# Patient Record
Sex: Male | Born: 1955 | Race: White | Hispanic: No | Marital: Married | Smoking: Never smoker
Health system: Southern US, Community
[De-identification: ages and names within clinical notes are randomized; demographics above are authoritative.]

## PROBLEM LIST (undated history)

## (undated) ENCOUNTER — Ambulatory Visit: Admission: RE | Discharge status: Absent without leave | Payer: BC Managed Care – PPO | Source: Ambulatory Visit

## (undated) DIAGNOSIS — G709 Myoneural disorder, unspecified: Secondary | ICD-10-CM

## (undated) DIAGNOSIS — R011 Cardiac murmur, unspecified: Secondary | ICD-10-CM

## (undated) DIAGNOSIS — M1710 Unilateral primary osteoarthritis, unspecified knee: Secondary | ICD-10-CM

## (undated) DIAGNOSIS — I712 Thoracic aortic aneurysm, without rupture: Secondary | ICD-10-CM

## (undated) DIAGNOSIS — D649 Anemia, unspecified: Secondary | ICD-10-CM

## (undated) DIAGNOSIS — I7121 Aneurysm of the ascending aorta, without rupture: Secondary | ICD-10-CM

## (undated) DIAGNOSIS — E785 Hyperlipidemia, unspecified: Secondary | ICD-10-CM

## (undated) DIAGNOSIS — M179 Osteoarthritis of knee, unspecified: Secondary | ICD-10-CM

## (undated) DIAGNOSIS — S83209A Unspecified tear of unspecified meniscus, current injury, unspecified knee, initial encounter: Secondary | ICD-10-CM

## (undated) DIAGNOSIS — J189 Pneumonia, unspecified organism: Secondary | ICD-10-CM

## (undated) DIAGNOSIS — G4730 Sleep apnea, unspecified: Secondary | ICD-10-CM

## (undated) DIAGNOSIS — R7303 Prediabetes: Secondary | ICD-10-CM

## (undated) DIAGNOSIS — G4733 Obstructive sleep apnea (adult) (pediatric): Secondary | ICD-10-CM

## (undated) DIAGNOSIS — I890 Lymphedema, not elsewhere classified: Secondary | ICD-10-CM

## (undated) DIAGNOSIS — I10 Essential (primary) hypertension: Secondary | ICD-10-CM

## (undated) DIAGNOSIS — H9313 Tinnitus, bilateral: Secondary | ICD-10-CM

## (undated) HISTORY — DX: Sleep apnea, unspecified: G47.30

## (undated) HISTORY — PX: OTHER SURGICAL HISTORY: SHX169

---
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
## 1978-01-10 HISTORY — PX: VARICOSE VEIN SURGERY: SHX832

## 1992-01-11 HISTORY — PX: OTHER SURGICAL HISTORY: SHX169

## 2006-07-31 ENCOUNTER — Encounter: Admission: RE | Admit: 2006-07-31 | Discharge: 2006-07-31 | Payer: Self-pay | Admitting: Orthopedic Surgery

## 2009-04-03 ENCOUNTER — Encounter: Admission: RE | Admit: 2009-04-03 | Discharge: 2009-04-03 | Payer: Self-pay | Admitting: Family Medicine

## 2009-06-16 ENCOUNTER — Encounter: Admission: RE | Admit: 2009-06-16 | Discharge: 2009-06-16 | Payer: Self-pay | Admitting: Orthopedic Surgery

## 2009-06-18 ENCOUNTER — Inpatient Hospital Stay (HOSPITAL_COMMUNITY): Admission: RE | Admit: 2009-06-18 | Discharge: 2009-06-21 | Payer: Self-pay | Admitting: Orthopedic Surgery

## 2009-06-18 HISTORY — PX: TOTAL HIP ARTHROPLASTY: SHX124

## 2010-01-31 ENCOUNTER — Encounter: Payer: Self-pay | Admitting: Orthopedic Surgery

## 2010-03-29 LAB — URINALYSIS, ROUTINE W REFLEX MICROSCOPIC
Bilirubin Urine: NEGATIVE
Glucose, UA: NEGATIVE mg/dL
Ketones, ur: NEGATIVE mg/dL
Protein, ur: NEGATIVE mg/dL

## 2010-03-29 LAB — BASIC METABOLIC PANEL
BUN: 13 mg/dL (ref 6–23)
Calcium: 7.8 mg/dL — ABNORMAL LOW (ref 8.4–10.5)
Creatinine, Ser: 1.02 mg/dL (ref 0.4–1.5)
Creatinine, Ser: 1.09 mg/dL (ref 0.4–1.5)
GFR calc Af Amer: >60 mL/min (ref >60)
GFR calc non Af Amer: >60 mL/min (ref >60)
Glucose, Bld: 128 mg/dL — ABNORMAL HIGH (ref 70–99)
Potassium: 3.9 mEq/L (ref 3.5–5.1)
Sodium: 136 mEq/L (ref 135–145)

## 2010-03-29 LAB — COMPREHENSIVE METABOLIC PANEL
ALT: 29 U/L (ref 0–53)
Alkaline Phosphatase: 48 U/L (ref 39–117)
Chloride: 104 mEq/L (ref 96–112)
GFR calc Af Amer: >60 mL/min (ref >60)
GFR calc non Af Amer: >60 mL/min (ref >60)
Glucose, Bld: 104 mg/dL — ABNORMAL HIGH (ref 70–99)
Potassium: 4 mEq/L (ref 3.5–5.1)
Sodium: 141 mEq/L (ref 135–145)
Total Bilirubin: 0.9 mg/dL (ref 0.3–1.2)

## 2010-03-29 LAB — CBC
HCT: 24.7 % — ABNORMAL LOW (ref 39.0–52.0)
Hemoglobin: 8.6 g/dL — ABNORMAL LOW (ref 13.0–17.0)
Hemoglobin: 8.6 g/dL — ABNORMAL LOW (ref 13.0–17.0)
MCHC: 34.1 g/dL (ref 30.0–36.0)
MCHC: 34.5 g/dL (ref 30.0–36.0)
MCHC: 34.8 g/dL (ref 30.0–36.0)
MCHC: 34.8 g/dL (ref 30.0–36.0)
MCV: 93.8 fL (ref 78.0–100.0)
MCV: 93.8 fL (ref 78.0–100.0)
MCV: 93.9 fL (ref 78.0–100.0)
Platelets: 114 10*3/uL — ABNORMAL LOW (ref 150–400)
Platelets: 124 10*3/uL — ABNORMAL LOW (ref 150–400)
RBC: 2.63 MIL/uL — ABNORMAL LOW (ref 4.22–5.81)
RBC: 2.65 MIL/uL — ABNORMAL LOW (ref 4.22–5.81)
RBC: 4.47 MIL/uL (ref 4.22–5.81)
RDW: 13 % (ref 11.5–15.5)
RDW: 13.3 % (ref 11.5–15.5)
RDW: 14.1 % (ref 11.5–15.5)
WBC: 6.9 10*3/uL (ref 4.0–10.5)

## 2010-03-29 LAB — CROSSMATCH
ABO/RH(D): A POS
Antibody Screen: NEGATIVE

## 2010-03-29 LAB — DIFFERENTIAL
Lymphs Abs: 0.8 10*3/uL (ref 0.7–4.0)
Monocytes Absolute: 0.5 10*3/uL (ref 0.1–1.0)
Monocytes Relative: 9 % (ref 3–12)
Neutrophils Relative %: 74 % (ref 43–77)

## 2010-03-29 LAB — ABO/RH: ABO/RH(D): A POS

## 2010-03-29 LAB — APTT: aPTT: 30 seconds (ref 24–37)

## 2011-04-19 ENCOUNTER — Emergency Department (HOSPITAL_COMMUNITY)
Admission: EM | Admit: 2011-04-19 | Discharge: 2011-04-20 | Disposition: A | Discharge status: Home / Self Care | Payer: Worker's Compensation | Attending: Emergency Medicine | Admitting: Emergency Medicine

## 2011-04-19 ENCOUNTER — Emergency Department (HOSPITAL_COMMUNITY): Payer: Worker's Compensation

## 2011-04-19 ENCOUNTER — Encounter (HOSPITAL_COMMUNITY): Payer: Self-pay | Admitting: Emergency Medicine

## 2011-04-19 DIAGNOSIS — Z96649 Presence of unspecified artificial hip joint: Secondary | ICD-10-CM | POA: Insufficient documentation

## 2011-04-19 DIAGNOSIS — M25562 Pain in left knee: Secondary | ICD-10-CM

## 2011-04-19 DIAGNOSIS — M25569 Pain in unspecified knee: Secondary | ICD-10-CM | POA: Insufficient documentation

## 2011-04-19 DIAGNOSIS — I10 Essential (primary) hypertension: Secondary | ICD-10-CM | POA: Insufficient documentation

## 2011-04-19 HISTORY — DX: Essential (primary) hypertension: I10

## 2011-04-19 NOTE — ED Notes (Signed)
Pt is c/o pain in his left knee  Pt states he stepped wrong yesterday and felt a sting in it but today the pain has gotten worse  Pt states as he was walking to his truck he heard a snap and since then it has been worse

## 2011-04-19 NOTE — ED Notes (Signed)
Pt called from triage with no answer 

## 2011-04-20 NOTE — ED Provider Notes (Signed)
History     CSN: [id number]  Arrival date & time 04/19/11  2104   First MD Initiated Contact with Patient 04/19/11 2354      Chief Complaint  Patient presents with  . Knee Pain    (Consider location/radiation/quality/duration/timing/severity/associated sxs/prior treatment) Patient is a 56 y.o. male presenting with knee pain. The history is provided by the patient. No language interpreter was used.  Knee Pain This is a new problem. The current episode started yesterday. The problem occurs intermittently. The problem has been gradually worsening. Associated symptoms include arthralgias. The symptoms are aggravated by walking. He has tried NSAIDs, acetaminophen and rest for the symptoms. The treatment provided no relief.    Past Medical History  Diagnosis Date  . Hypertension   . Arthritis     Past Surgical History  Procedure Date  . Total hip arthroplasty   . Veins removed from left leg   . Hernia repair     Family History  Problem Relation Age of Onset  . Diabetes Mother   . Cancer Mother   . Heart failure Other   . Diabetes Other   . Cancer Other   . COPD Other     History  Substance Use Topics  . Smoking status: Never Smoker   . Smokeless tobacco: Not on file  . Alcohol Use: No      Review of Systems  Musculoskeletal: Positive for arthralgias.  All other systems reviewed and are negative.    Allergies  Penicillins  Home Medications   Current Outpatient Rx  Name Route Sig Dispense Refill  . ACETAMINOPHEN 500 MG PO TABS Oral Take 1,000 mg by mouth every 6 (six) hours as needed. Pain    . ATORVASTATIN CALCIUM 10 MG PO TABS Oral Take 10 mg by mouth daily.    . FUROSEMIDE 40 MG PO TABS Oral Take 40 mg by mouth daily.    . IBUPROFEN 800 MG PO TABS Oral Take 800 mg by mouth every 8 (eight) hours as needed. Pain    . LISINOPRIL 40 MG PO TABS Oral Take 40 mg by mouth daily.      BP 165/85  Pulse 64  Temp(Src) 98.3 F (36.8 C) (Oral)  Resp 22  Wt 285  lb (129.275 kg)  SpO2 97%  Physical Exam  Nursing note and vitals reviewed. Constitutional: He is oriented to person, place, and time. He appears well-developed and well-nourished.  HENT:  Head: Normocephalic.  Eyes: Pupils are equal, round, and reactive to light.  Neck: Normal range of motion. Neck supple.  Cardiovascular: Normal rate, regular rhythm, normal heart sounds and intact distal pulses.   Pulmonary/Chest: Effort normal and breath sounds normal.  Abdominal: Soft. Bowel sounds are normal.  Musculoskeletal: Normal range of motion. He exhibits no tenderness.       Left knee: He exhibits no swelling, no effusion, no deformity, no LCL laxity and no MCL laxity.  Neurological: He is alert and oriented to person, place, and time.  Skin: Skin is warm and dry.  Psychiatric: He has a normal mood and affect. His behavior is normal. Judgment and thought content normal.    ED Course  Procedures (including critical care time)  Labs Reviewed - No data to display Dg Knee Complete 4 Views Left  04/20/2011  *RADIOLOGY REPORT*  Clinical Data: Twisted left knee, with left knee pain.  LEFT KNEE - COMPLETE 4+ VIEW  Comparison: None.  Findings: There is no evidence of fracture or dislocation.  The  joint spaces are preserved.  No significant degenerative change is seen; the patellofemoral joint is grossly unremarkable in appearance.  No significant joint effusion is seen.  Mild scattered vascular calcifications are seen.  IMPRESSION:  1.  No evidence of fracture or dislocation. 2.  Mild scattered vascular calcifications seen.  Original Report Authenticated By: Tonia Ghent, M.D.     No diagnosis found.   Knee pain MDM          Jimmye Norman, NP 04/20/11 6295

## 2011-04-20 NOTE — ED Provider Notes (Signed)
Medical screening examination/treatment/procedure(s) were performed by non-physician practitioner and as supervising physician I was immediately available for consultation/collaboration.   Vida Roller, MD 04/20/11 [phone]

## 2011-04-20 NOTE — Discharge Instructions (Signed)
Crutch Use You have been prescribed crutches to take weight off one of your lower legs or feet (extremities). When using crutches, make sure you are not putting pressure on the armpit (axilla). This could cause damage to the nerves that extend from your axilla to the hand and arm. When fitted properly the crutches should be 2 to 3 finger widths below the axilla. Your weight should be supported by your hand, and not by resting upon the crutch with the axilla. When walking, first step with the crutches, then swing the healthy leg through and slightly ahead. When going up stairs, first step up with the healthy leg and then follow with the crutches and injured leg up to the same step, and so forth. If there is a handrail, hold both crutches in one hand, place your other hand on the handrail, and while placing your weight on your arms, lift your good leg to the step, then bring the crutches and the injured leg up to that step. Repeat for each step. When going down stairs, first step with the injured leg and crutches, following down with the healthy leg to the same step. Be very careful, as going down stairs with crutches is very challenging. If you feel wobbly or nervous, sit down and inch yourself down the stairs on your butt. To get up from a chair, hold injured leg forward, grab armrest with one hand and the top of the crutches with the other hand. Using these supports, pull yourself up to a standing position. Reverse this procedure for sitting. See your caregiver for follow up as suggested. If you are discharged in an ace wrap and develop numbness, tingling, swelling, or increased pain, loosen the ace wrap and re-wrap looser. If these problems persist, see your caregiver as needed. If you have been instructed to use partial weight bearing, bear (apply) the amount of weight as suggested by your caregiver. Do not bear weight in an amount that causes pain on the area of injury. Document Released: 12/25/1999  Document Revised: 12/16/2010 Document Reviewed: 03/03/2008 [facility] Patient Information 2012 [place], [place].  Wear and Tear Disorders of the Knee (Arthritis, Osteoarthritis) Everyone will experience wear and tear injuries (arthritis, osteoarthritis) of the knee. These are the changes we all get as we age. They come from the joint stress of daily living. The amount of cartilage damage in your knee and your symptoms determine if you need surgery. Mild problems require approximately two months recovery time. More severe problems take several months to recover. With mild problems, your surgeon may find worn and rough cartilage surfaces. With severe changes, your surgeon may find cartilage that has completely worn away and exposed the bone. Loose bodies of bone and cartilage, bone spurs (excess bone growth), and injuries to the menisci (cushions between the large bones of your leg) are also common. All of these problems can cause pain. For a mild wear and tear problem, rough cartilage may simply need to be shaved and smoothed. For more severe problems with areas of exposed bone, your surgeon may use an instrument for roughing up the bone surfaces to stimulate new cartilage growth. Loose bodies are usually removed. Torn menisci may be trimmed or repaired. ABOUT THE ARTHROSCOPIC PROCEDURE Arthroscopy is a surgical technique. It allows your orthopedic surgeon to diagnose and treat your knee injury with accuracy. The surgeon looks into your knee through a small scope. The scope is like a small (pencil-sized) telescope. Arthroscopy is less invasive than open knee surgery. You  can expect a more rapid recovery. After the procedure, you will be moved to a recovery area until most of the effects of the medication have worn off. Your caregiver will discuss the test results with you. RECOVERY The severity of the arthritis and the type of procedure performed will determine recovery time. Other important factors include  age, physical condition, medical conditions, and the type of rehabilitation program. Strengthening your muscles after arthroscopy helps guarantee a better recovery. Follow your caregiver's instructions. Use crutches, rest, elevate, ice, and do knee exercises as instructed. Your caregivers will help you and instruct you with exercises and other physical therapy required to regain your mobility, muscle strength, and functioning following surgery. Only take over-the-counter or prescription medicines for pain, discomfort, or fever as directed by your caregiver.  SEEK MEDICAL CARE IF:   There is increased bleeding (more than a small spot) from the wound.   You notice redness, swelling, or increasing pain in the wound.   Pus is coming from wound.   You develop an unexplained oral temperature above 102 F (38.9 C) , or as your caregiver suggests.   You notice a foul smell coming from the wound or dressing.   You have severe pain with motion of the knee.  SEEK IMMEDIATE MEDICAL CARE IF:   You develop a rash.   You have difficulty breathing.   You have any allergic problems.  MAKE SURE YOU:   Understand these instructions.   Will watch your condition.   Will get help right away if you are not doing well or get worse.  Document Released: 12/25/1999 Document Revised: 12/16/2010 Document Reviewed: 05/23/2007 [facility] Patient Information 2012 [place], [place].

## 2011-09-09 ENCOUNTER — Other Ambulatory Visit: Payer: Self-pay | Admitting: Pain Medicine

## 2011-09-23 ENCOUNTER — Encounter (HOSPITAL_BASED_OUTPATIENT_CLINIC_OR_DEPARTMENT_OTHER): Payer: Self-pay | Admitting: *Deleted

## 2011-09-23 NOTE — Progress Notes (Signed)
NPO AFTER MN. ARRIVES AT 0945. NEEDS EKG AND ISTAT. WILL TAKE LIPITOR AND TRAMADOL IF NEEDED AM OF SURG W/ SIP OF WATER.

## 2011-09-27 ENCOUNTER — Encounter (HOSPITAL_BASED_OUTPATIENT_CLINIC_OR_DEPARTMENT_OTHER)
Admission: RE | Disposition: A | Discharge status: Home / Self Care | Payer: Self-pay | Source: Ambulatory Visit | Attending: Specialist

## 2011-09-27 ENCOUNTER — Encounter (HOSPITAL_BASED_OUTPATIENT_CLINIC_OR_DEPARTMENT_OTHER): Payer: Self-pay | Admitting: Anesthesiology

## 2011-09-27 ENCOUNTER — Other Ambulatory Visit: Payer: Self-pay | Admitting: Family Medicine

## 2011-09-27 ENCOUNTER — Ambulatory Visit (HOSPITAL_BASED_OUTPATIENT_CLINIC_OR_DEPARTMENT_OTHER)
Admission: RE | Admit: 2011-09-27 | Discharge: 2011-09-27 | Disposition: A | Discharge status: Home / Self Care | Payer: Worker's Compensation | Source: Ambulatory Visit | Attending: Specialist | Admitting: Specialist

## 2011-09-27 DIAGNOSIS — I719 Aortic aneurysm of unspecified site, without rupture: Secondary | ICD-10-CM

## 2011-09-27 HISTORY — DX: Thoracic aortic aneurysm, without rupture: I71.2

## 2011-09-27 HISTORY — DX: Tinnitus, bilateral: H93.13

## 2011-09-27 HISTORY — DX: Cardiac murmur, unspecified: R01.1

## 2011-09-27 HISTORY — DX: Hyperlipidemia, unspecified: E78.5

## 2011-09-27 HISTORY — DX: Aneurysm of the ascending aorta, without rupture: I71.21

## 2011-09-27 HISTORY — DX: Obstructive sleep apnea (adult) (pediatric): G47.33

## 2011-09-27 HISTORY — DX: Osteoarthritis of knee, unspecified: M17.9

## 2011-09-27 HISTORY — DX: Unilateral primary osteoarthritis, unspecified knee: M17.10

## 2011-09-27 HISTORY — DX: Unspecified tear of unspecified meniscus, current injury, unspecified knee, initial encounter: S83.209A

## 2011-09-27 SURGERY — ARTHROSCOPY, KNEE
Anesthesia: Monitor Anesthesia Care | Laterality: Left

## 2011-09-27 SURGICAL SUPPLY — 42 items
BANDAGE ESMARK 6X9 LF (GAUZE/BANDAGES/DRESSINGS) IMPLANT
BANDAGE GAUZE ELAST BULKY 4 IN (GAUZE/BANDAGES/DRESSINGS) IMPLANT
BLADE 4.2CUDA (BLADE) IMPLANT
BLADE CUDA GRT WHITE 3.5 (BLADE) IMPLANT
BLADE CUDA SHAVER 3.5 (BLADE) IMPLANT
BNDG ESMARK 6X9 LF (GAUZE/BANDAGES/DRESSINGS)
CANISTER SUCT LVC 12 LTR MEDI- (MISCELLANEOUS) IMPLANT
CANISTER SUCTION 1200CC (MISCELLANEOUS) IMPLANT
CLOTH BEACON ORANGE TIMEOUT ST (SAFETY) IMPLANT
DRAPE ARTHROSCOPY W/POUCH 114 (DRAPES) IMPLANT
DRAPE INCISE 23X17 IOBAN STRL (DRAPES)
DRAPE INCISE IOBAN 23X17 STRL (DRAPES) IMPLANT
DRAPE INCISE IOBAN 66X45 STRL (DRAPES) IMPLANT
DRSG PAD ABDOMINAL 8X10 ST (GAUZE/BANDAGES/DRESSINGS) IMPLANT
DURAPREP 26ML APPLICATOR (WOUND CARE) IMPLANT
ELECT MENISCUS 165MM 90D (ELECTRODE) IMPLANT
ELECT REM PT RETURN 9FT ADLT (ELECTROSURGICAL)
ELECTRODE REM PT RTRN 9FT ADLT (ELECTROSURGICAL) IMPLANT
GAUZE XEROFORM 1X8 LF (GAUZE/BANDAGES/DRESSINGS) IMPLANT
GLOVE INDICATOR 8.0 STRL GRN (GLOVE) IMPLANT
GLOVE SURG ORTHO 8.0 STRL STRW (GLOVE) IMPLANT
GOWN PREVENTION PLUS LG XLONG (DISPOSABLE) IMPLANT
GOWN STRL REIN XL XLG (GOWN DISPOSABLE) IMPLANT
IMMOBILIZER KNEE 22 UNIV (SOFTGOODS) IMPLANT
IMMOBILIZER KNEE 24 THIGH 36 (MISCELLANEOUS) IMPLANT
IMMOBILIZER KNEE 24 UNIV (MISCELLANEOUS)
KNEE WRAP E Z 3 GEL PACK (MISCELLANEOUS) IMPLANT
MINI VAC (SURGICAL WAND) IMPLANT
PACK ARTHROSCOPY DSU (CUSTOM PROCEDURE TRAY) IMPLANT
PACK BASIN DAY SURGERY FS (CUSTOM PROCEDURE TRAY) IMPLANT
PADDING CAST ABS 4INX4YD NS (CAST SUPPLIES)
PADDING CAST ABS COTTON 4X4 ST (CAST SUPPLIES) IMPLANT
PENCIL BUTTON HOLSTER BLD 10FT (ELECTRODE) IMPLANT
SET ARTHROSCOPY TUBING (MISCELLANEOUS)
SET ARTHROSCOPY TUBING LN (MISCELLANEOUS) IMPLANT
SPONGE GAUZE 4X4 12PLY (GAUZE/BANDAGES/DRESSINGS) IMPLANT
SUT ETHILON 3 0 FSL (SUTURE) IMPLANT
SYR CONTROL 10ML LL (SYRINGE) IMPLANT
TOWEL OR 17X24 6PK STRL BLUE (TOWEL DISPOSABLE) IMPLANT
WAND 30 DEG SABER W/CORD (SURGICAL WAND) IMPLANT
WAND 90 DEG TURBOVAC W/CORD (SURGICAL WAND) IMPLANT
WATER STERILE IRR 500ML POUR (IV SOLUTION) IMPLANT

## 2011-09-27 NOTE — Anesthesia Preprocedure Evaluation (Deleted)
Anesthesia Evaluation Anesthesia Physical Anesthesia Plan  ASA:   Anesthesia Plan:    Post-op Pain Management:    Induction:   Airway Management Planned:   Additional Equipment:   Intra-op Plan:   Post-operative Plan:   Informed Consent:   Plan Discussed with:   Anesthesia Plan Comments: (Pt cancelled. CT angio 06/2009 showed 4.8 cm ascending aortic aneurysm. No follow up since. Needs re-evaluation/comparison. Explained to patient extensively.)        Anesthesia Quick Evaluation

## 2011-09-27 NOTE — H&P (Signed)
Travis Myers is an 56 y.o. male.   Chief Complaint leftknee hurts HPI: left knee hurts after work related injury  Past Medical History  Diagnosis Date  . Hypertension   . Hyperlipidemia   . OA (osteoarthritis) of knee left  . Acute meniscal tear of knee left  . OSA (obstructive sleep apnea) NON-TOLERANT CPAP  . Aneurysm, ascending aorta 4.8cm per ct 2011    ASYMPTOMATIC  . Heart murmur MILD-- ASYMPTOMATIC  . Tinnitus of both ears     Past Surgical History  Procedure Date  . Total hip arthroplasty 06-18-2009    OA LEFT HIP  . Veins removed from left leg 1994  . Bilateral iinguinal hernia repair 1963  . Tonsillectomy 1965    Family History  Problem Relation Age of Onset  . Diabetes Mother   . Cancer Mother   . Heart failure Other   . Diabetes Other   . Cancer Other   . COPD Other    Social History:  reports that he has never smoked. He has never used smokeless tobacco. He reports that he drinks alcohol. He reports that he does not use illicit drugs.  Allergies:  Allergies  Allergen Reactions  . Penicillins Other (See Comments)    Unknown reaction    No prescriptions prior to admission    No results found for this or any previous visit (from the past 48 hour(s)). No results found.  ROS negative  Height 6' (1.829 m), weight 126.554 kg (279 lb). Physical Exam  Knee tender joint line full ROM NV intact2 Assessment/Plan left *knee torn meniscus Aneurysm found by anesthesiologist and he has cancelled surgery until evaluated Billiejean Schimek ANDREW 09/27/2011, 1:32 PM

## 2011-09-29 ENCOUNTER — Ambulatory Visit
Admission: RE | Admit: 2011-09-29 | Discharge: 2011-09-29 | Disposition: A | Discharge status: Home / Self Care | Payer: Self-pay | Source: Ambulatory Visit | Attending: Family Medicine | Admitting: Family Medicine

## 2011-09-29 DIAGNOSIS — I719 Aortic aneurysm of unspecified site, without rupture: Secondary | ICD-10-CM

## 2011-09-29 MED ORDER — IOHEXOL 350 MG/ML SOLN
80.0000 mL | Freq: Once | INTRAVENOUS | Status: AC | PRN
  Administered 2011-09-29: 80 mL via INTRAVENOUS

## 2011-10-13 HISTORY — PX: NM MYOCAR PERF WALL MOTION: HXRAD629

## 2011-10-14 ENCOUNTER — Ambulatory Visit: Payer: Self-pay | Admitting: Cardiovascular Disease

## 2011-10-18 HISTORY — PX: DOPPLER ECHOCARDIOGRAPHY: SHX263

## 2011-10-21 ENCOUNTER — Encounter (HOSPITAL_BASED_OUTPATIENT_CLINIC_OR_DEPARTMENT_OTHER): Payer: Self-pay | Admitting: *Deleted

## 2011-10-21 NOTE — Progress Notes (Addendum)
NPO AFTER MN WITH EXCEPTION CLEAR LIQUIDS UNTIL 0830. (NO CREAM/ MILK PRODUCTS). ARRIVES AT 1230. NEEDS ISTAT. CURRENT EKG, LOV, STRESS TEST AND ECHO TO BE FAXED FROM DR [name].   REVIEWED CHART W/ DR FORTUNE MDA W/ CARDIAC NOTE/ CLEARANCE, OK TO PROCEED.

## 2011-10-24 ENCOUNTER — Other Ambulatory Visit: Payer: Self-pay | Admitting: Pain Medicine

## 2011-10-24 NOTE — Progress Notes (Signed)
SPOKE W/ PT TODAY . CASE MOVED TO Thursday 10-27-2011 PER OFFICE . SAME TIME OF ARRIVAL AND INSTRUCTIONS, PT VERBALIZED UNDERSTANTING.

## 2011-10-27 ENCOUNTER — Encounter (HOSPITAL_BASED_OUTPATIENT_CLINIC_OR_DEPARTMENT_OTHER): Payer: Self-pay | Admitting: Anesthesiology

## 2011-10-27 ENCOUNTER — Encounter (HOSPITAL_BASED_OUTPATIENT_CLINIC_OR_DEPARTMENT_OTHER)
Admission: RE | Disposition: A | Discharge status: Home / Self Care | Payer: Self-pay | Source: Ambulatory Visit | Attending: Specialist

## 2011-10-27 ENCOUNTER — Ambulatory Visit (HOSPITAL_BASED_OUTPATIENT_CLINIC_OR_DEPARTMENT_OTHER): Payer: Worker's Compensation | Admitting: Anesthesiology

## 2011-10-27 ENCOUNTER — Ambulatory Visit (HOSPITAL_BASED_OUTPATIENT_CLINIC_OR_DEPARTMENT_OTHER)
Admission: RE | Admit: 2011-10-27 | Discharge: 2011-10-27 | Disposition: A | Discharge status: Home / Self Care | Payer: Worker's Compensation | Source: Ambulatory Visit | Attending: Specialist | Admitting: Specialist

## 2011-10-27 ENCOUNTER — Encounter (HOSPITAL_BASED_OUTPATIENT_CLINIC_OR_DEPARTMENT_OTHER): Payer: Self-pay | Admitting: *Deleted

## 2011-10-27 DIAGNOSIS — X58XXXA Exposure to other specified factors, initial encounter: Secondary | ICD-10-CM | POA: Insufficient documentation

## 2011-10-27 DIAGNOSIS — Z79899 Other long term (current) drug therapy: Secondary | ICD-10-CM | POA: Insufficient documentation

## 2011-10-27 DIAGNOSIS — Z9889 Other specified postprocedural states: Secondary | ICD-10-CM

## 2011-10-27 DIAGNOSIS — M1710 Unilateral primary osteoarthritis, unspecified knee: Secondary | ICD-10-CM | POA: Insufficient documentation

## 2011-10-27 DIAGNOSIS — E785 Hyperlipidemia, unspecified: Secondary | ICD-10-CM | POA: Insufficient documentation

## 2011-10-27 DIAGNOSIS — I10 Essential (primary) hypertension: Secondary | ICD-10-CM | POA: Insufficient documentation

## 2011-10-27 DIAGNOSIS — IMO0002 Reserved for concepts with insufficient information to code with codable children: Secondary | ICD-10-CM | POA: Insufficient documentation

## 2011-10-27 LAB — POCT I-STAT, CHEM 8
BUN: 17 mg/dL (ref 6–23)
Calcium, Ion: 1.23 mmol/L (ref 1.12–1.23)
Chloride: 104 mEq/L (ref 96–112)
Creatinine, Ser: 1 mg/dL (ref 0.50–1.35)
Glucose, Bld: 100 mg/dL — ABNORMAL HIGH (ref 70–99)
HCT: 41 % (ref 39.0–52.0)
Potassium: 4.1 mEq/L (ref 3.5–5.1)

## 2011-10-27 SURGERY — ARTHROSCOPY, KNEE, WITH MEDIAL MENISCECTOMY
Anesthesia: General | Site: Knee | Laterality: Left | Wound class: Clean

## 2011-10-27 MED ORDER — SODIUM CHLORIDE 0.9 % IV SOLN: INTRAVENOUS | Status: DC

## 2011-10-27 MED ORDER — ACETAMINOPHEN 10 MG/ML IV SOLN
INTRAVENOUS | Status: DC | PRN
  Administered 2011-10-27: 1000 mg via INTRAVENOUS

## 2011-10-27 MED ORDER — MIDAZOLAM HCL 5 MG/5ML IJ SOLN
INTRAMUSCULAR | Status: DC | PRN
  Administered 2011-10-27: 2 mg via INTRAVENOUS

## 2011-10-27 MED ORDER — BUPIVACAINE-EPINEPHRINE PF 0.25-1:200000 % IJ SOLN
INTRAMUSCULAR | Status: DC | PRN
  Administered 2011-10-27: 30 mL

## 2011-10-27 MED ORDER — HYDROCODONE-ACETAMINOPHEN 5-325 MG PO TABS: 1.0000 | ORAL_TABLET | ORAL | Status: DC | PRN

## 2011-10-27 MED ORDER — DOXYCYCLINE HYCLATE 50 MG PO CAPS: 100.0000 mg | ORAL_CAPSULE | Freq: Two times a day (BID) | ORAL | Status: DC

## 2011-10-27 MED ORDER — SODIUM CHLORIDE 0.9 % IR SOLN
Status: DC | PRN
  Administered 2011-10-27: 12000 mL

## 2011-10-27 MED ORDER — LACTATED RINGERS IV SOLN: INTRAVENOUS | Status: DC

## 2011-10-27 MED ORDER — PROMETHAZINE HCL 25 MG/ML IJ SOLN: 6.2500 mg | INTRAMUSCULAR | Status: DC | PRN

## 2011-10-27 MED ORDER — LACTATED RINGERS IV SOLN
INTRAVENOUS | Status: DC
  Administered 2011-10-27 (×2): via INTRAVENOUS

## 2011-10-27 MED ORDER — MORPHINE SULFATE 4 MG/ML IJ SOLN
INTRAMUSCULAR | Status: DC | PRN
  Administered 2011-10-27: 4 mg

## 2011-10-27 MED ORDER — FENTANYL CITRATE 0.05 MG/ML IJ SOLN
INTRAMUSCULAR | Status: DC | PRN
  Administered 2011-10-27 (×4): 50 ug via INTRAVENOUS

## 2011-10-27 MED ORDER — PROPOFOL 10 MG/ML IV BOLUS
INTRAVENOUS | Status: DC | PRN
  Administered 2011-10-27: 300 mg via INTRAVENOUS

## 2011-10-27 MED ORDER — MIDAZOLAM HCL 2 MG/2ML IJ SOLN
2.0000 mg | Freq: Once | INTRAMUSCULAR | Status: AC
  Administered 2011-10-27: 2 mg via INTRAVENOUS

## 2011-10-27 MED ORDER — FENTANYL CITRATE 0.05 MG/ML IJ SOLN
100.0000 ug | Freq: Once | INTRAMUSCULAR | Status: AC
  Administered 2011-10-27: 100 ug via INTRAVENOUS

## 2011-10-27 MED ORDER — POVIDONE-IODINE 7.5 % EX SOLN: Freq: Once | CUTANEOUS | Status: DC

## 2011-10-27 MED ORDER — LIDOCAINE HCL (CARDIAC) 20 MG/ML IV SOLN
INTRAVENOUS | Status: DC | PRN
  Administered 2011-10-27: 80 mg via INTRAVENOUS

## 2011-10-27 MED ORDER — HYDROMORPHONE HCL PF 1 MG/ML IJ SOLN: 0.2500 mg | INTRAMUSCULAR | Status: DC | PRN

## 2011-10-27 MED ORDER — BUPIVACAINE HCL 0.25 % IJ SOLN
INTRAMUSCULAR | Status: DC | PRN
  Administered 2011-10-27: 20 mL

## 2011-10-27 MED ORDER — DEXAMETHASONE SODIUM PHOSPHATE 4 MG/ML IJ SOLN
INTRAMUSCULAR | Status: DC | PRN
  Administered 2011-10-27: 10 mg via INTRAVENOUS

## 2011-10-27 MED ORDER — CLINDAMYCIN PHOSPHATE 900 MG/50ML IV SOLN
900.0000 mg | INTRAVENOUS | Status: AC
  Administered 2011-10-27: 900 mg via INTRAVENOUS

## 2011-10-27 MED ORDER — ONDANSETRON HCL 4 MG/2ML IJ SOLN
INTRAMUSCULAR | Status: DC | PRN
  Administered 2011-10-27: 4 mg via INTRAVENOUS

## 2011-10-27 SURGICAL SUPPLY — 48 items
BANDAGE ESMARK 6X9 LF (GAUZE/BANDAGES/DRESSINGS) IMPLANT
BANDAGE GAUZE ELAST BULKY 4 IN (GAUZE/BANDAGES/DRESSINGS) ×3 IMPLANT
BLADE 4.2CUDA (BLADE) IMPLANT
BLADE CUDA GRT WHITE 3.5 (BLADE) ×6 IMPLANT
BLADE CUDA SHAVER 3.5 (BLADE) IMPLANT
BNDG ESMARK 6X9 LF (GAUZE/BANDAGES/DRESSINGS)
CANISTER SUCT LVC 12 LTR MEDI- (MISCELLANEOUS) ×3 IMPLANT
CANISTER SUCTION 1200CC (MISCELLANEOUS) ×3 IMPLANT
CLOTH BEACON ORANGE TIMEOUT ST (SAFETY) ×3 IMPLANT
DRAPE ARTHROSCOPY W/POUCH 114 (DRAPES) ×3 IMPLANT
DRAPE INCISE 23X17 IOBAN STRL (DRAPES) ×1
DRAPE INCISE IOBAN 23X17 STRL (DRAPES) ×2 IMPLANT
DRAPE INCISE IOBAN 66X45 STRL (DRAPES) IMPLANT
DRSG PAD ABDOMINAL 8X10 ST (GAUZE/BANDAGES/DRESSINGS) ×3 IMPLANT
DURAPREP 26ML APPLICATOR (WOUND CARE) ×3 IMPLANT
ELECT MENISCUS 165MM 90D (ELECTRODE) IMPLANT
ELECT REM PT RETURN 9FT ADLT (ELECTROSURGICAL)
ELECTRODE REM PT RTRN 9FT ADLT (ELECTROSURGICAL) IMPLANT
GAUZE XEROFORM 1X8 LF (GAUZE/BANDAGES/DRESSINGS) ×3 IMPLANT
GLOVE ECLIPSE 7.0 STRL STRAW (GLOVE) ×3 IMPLANT
GLOVE INDICATOR 7.0 STRL GRN (GLOVE) ×6 IMPLANT
GLOVE INDICATOR 8.0 STRL GRN (GLOVE) ×6 IMPLANT
GLOVE INDICATOR 8.5 STRL (GLOVE) ×3 IMPLANT
GLOVE SURG ORTHO 8.0 STRL STRW (GLOVE) ×6 IMPLANT
GOWN PREVENTION PLUS LG XLONG (DISPOSABLE) ×6 IMPLANT
GOWN STRL REIN XL XLG (GOWN DISPOSABLE) ×6 IMPLANT
IMMOBILIZER KNEE 22 UNIV (SOFTGOODS) IMPLANT
IMMOBILIZER KNEE 24 THIGH 36 (MISCELLANEOUS) IMPLANT
IMMOBILIZER KNEE 24 UNIV (MISCELLANEOUS)
IV NS IRRIG 3000ML ARTHROMATIC (IV SOLUTION) ×12 IMPLANT
KNEE WRAP E Z 3 GEL PACK (MISCELLANEOUS) ×3 IMPLANT
MINI VAC (SURGICAL WAND) ×3 IMPLANT
PACK ARTHROSCOPY DSU (CUSTOM PROCEDURE TRAY) ×3 IMPLANT
PACK BASIN DAY SURGERY FS (CUSTOM PROCEDURE TRAY) ×3 IMPLANT
PADDING CAST ABS 4INX4YD NS (CAST SUPPLIES) ×1
PADDING CAST ABS COTTON 4X4 ST (CAST SUPPLIES) ×2 IMPLANT
PADDING CAST COTTON 6X4 STRL (CAST SUPPLIES) ×3 IMPLANT
PENCIL BUTTON HOLSTER BLD 10FT (ELECTRODE) IMPLANT
SET ARTHROSCOPY TUBING (MISCELLANEOUS) ×1
SET ARTHROSCOPY TUBING LN (MISCELLANEOUS) ×2 IMPLANT
SPONGE GAUZE 4X4 12PLY (GAUZE/BANDAGES/DRESSINGS) ×3 IMPLANT
SUT ETHILON 3 0 FSL (SUTURE) ×3 IMPLANT
SYR CONTROL 10ML LL (SYRINGE) ×3 IMPLANT
SYRINGE 10CC LL (SYRINGE) ×3 IMPLANT
TOWEL OR 17X24 6PK STRL BLUE (TOWEL DISPOSABLE) ×3 IMPLANT
WAND 30 DEG SABER W/CORD (SURGICAL WAND) IMPLANT
WAND 90 DEG TURBOVAC W/CORD (SURGICAL WAND) ×3 IMPLANT
WATER STERILE IRR 500ML POUR (IV SOLUTION) ×3 IMPLANT

## 2011-10-27 NOTE — Op Note (Signed)
Preop diagnosis left knee torn medial meniscus Postop diagnosis same Procedure left knee arthroscopic partial medial meniscectomy Surgeon Valma Cava M.D. Assistant Oneida Alar PA-C Anesthesia knee block with monitored anesthesia care Estimated blood loss minimal Drains none Tourniquet time none Complications none Disposition to PACU stable  Operative details Patient was counseled in the holding area. Cracks site was marked and signed appropriately. Block was administered per anesthesiologist. Fixation taken to the operating room IV Ancef given preoperatively. In the operating room placed supine monitor anesthesia care left eye and a thigh holder prepped with DuraPrep and draped into a sterile fashion.  Time out was done and confirmed by all involved. Arthroscopic portals were established proximal medial inferomedial and inferolateral. Diagnostic arthroscopy revealed normal synovium. A Patellofemoral joint normal articular cartilage and tracking. Anterior and posterior cruciate ligaments intact. Lateral compartment was inspected lateral femoral condyle unremarkable lateral tibial plateau mild chondromalacia lateral meniscus intact. Medial and lateral gutters unremarkable.  Medial compartment inspected articular cartilage was healthy. Complex tear posterior horn medial meniscus radial component and horizontal cleavage component utilizing baskets and motorized shaver a partial medial meniscectomy was performed back to a nice stable rim. ArthroCare system was utilized to gently smoothed down the edges. No other abnormalities were noted. Irrigated arthroscopic equipment was removed. 3 portals were closed with 4-0 nylon suture. 10 cc of 0.25% Marcaine was placed into the skin and 10 cc of 0.25% Marcaine with 4 mg of morphine sulfate was injected into the knee joint. Sterile dressing was applied TED hose and ice pack. No complications or problems. Awakened taken from operating room to PACU in stable  condition.

## 2011-10-27 NOTE — H&P (Signed)
Travis Myers is an 56 y.o. male.   Chief Complaint: Left knee pain HPI: 56 year old gentleman well-known to Dr. Thomasena Myers for evaluation of left knee pain. Patient initially had an injury earlier this spring which is of initial evaluation felt that an MCL sprain and a subchondral fracture the medial tibial plateau. Patient MRI also indicated he had meniscal tears within the knee. Patient's was treated conservative initially to allow the MCL sprain and a subchondral fracture to heal but continued having problems related to the internal meniscal tearing. Patient was initially scheduled to have a knee arthroscopy previously but patient was found to have an aortic dilatation. His surgery was canceled and for cardiac evaluation of his aorta. Patient is subsequently been cleared for this upcoming surgery which is monitoring his aorta. He denies any other change in his current medical history. Patient is elected proceed with a knee arthroscopy of the left for debridement of the torn menisci and possible chondroplasties for some arthritic issues.  Past Medical History  Diagnosis Date  . Hypertension   . Hyperlipidemia   . OA (osteoarthritis) of knee left  . Acute meniscal tear of knee left  . OSA (obstructive sleep apnea) NON-TOLERANT CPAP  . Heart murmur MILD-- ASYMPTOMATIC  . Tinnitus of both ears   . Aneurysm, ascending aorta 4.8cm per ct 2011/ CARDIOLOGSIT--  DR [name]  LOV  10-07-2011  NOTE W/ CHART AND REQUEST LEXISCAN MYOVIEW AND ECHO TO BE FAXED.    ASYMPTOMATIC    Past Surgical History  Procedure Date  . Total hip arthroplasty 06-18-2009    OA LEFT HIP  . Veins removed from left leg 1994  . Bilateral iinguinal hernia repair 1963  . Tonsillectomy 1965    Family History  Problem Relation Age of Onset  . Diabetes Mother   . Cancer Mother   . Heart failure Other   . Diabetes Other   . Cancer Other   . COPD Other    Social History:  reports that he has never smoked. He has never  used smokeless tobacco. He reports that he drinks alcohol. He reports that he does not use illicit drugs.  Allergies:  Allergies  Allergen Reactions  . Penicillins Other (See Comments)    Unknown reaction    Medications Prior to Admission  Medication Sig Dispense Refill  . atorvastatin (LIPITOR) 10 MG tablet Take 10 mg by mouth every morning.       . furosemide (LASIX) 40 MG tablet Take 40 mg by mouth every morning.       Marland Kitchen lisinopril (PRINIVIL,ZESTRIL) 40 MG tablet Take 40 mg by mouth every morning.       . traMADol (ULTRAM) 50 MG tablet Take 50 mg by mouth every 6 (six) hours as needed.      Marland Kitchen acetaminophen (TYLENOL) 500 MG tablet Take 1,000 mg by mouth every 6 (six) hours as needed. Pain        Results for orders placed during the hospital encounter of 10/27/11 (from the past 48 hour(s))  POCT I-STAT, CHEM 8     Status: Abnormal   Collection Time   10/27/11 12:58 PM      Component Value Range Comment   Sodium 139  135 - 145 mEq/L    Potassium 4.1  3.5 - 5.1 mEq/L    Chloride 104  96 - 112 mEq/L    BUN 17  6 - 23 mg/dL    Creatinine, Ser 1.61  0.50 - 1.35 mg/dL  Glucose, Bld 100 (*) 70 - 99 mg/dL    Calcium, Ion 1.61  0.96 - 1.23 mmol/L    TCO2 23  0 - 100 mmol/L    Hemoglobin 13.9  13.0 - 17.0 g/dL    HCT 04.5  40.9 - 81.1 %    No results found.  Review of Systems  All other systems reviewed and are negative.    Blood pressure 139/86, pulse 66, temperature 97.6 F (36.4 C), temperature source Oral, resp. rate 20, height 6' (1.829 m), weight 135.626 kg (299 lb), SpO2 97.00%. Physical Exam patient is conscious alert well-developed healthy-appearing gentleman appears to be in no distress in hospital gurney his neck is supple no palpable lymphadenopathy good range of motion of the cervical spine. Chest is clear throughout heart is regular rate and rhythm abdomen is soft he does have bowel sounds present she's got normal motion of his upper tremoring. Left knee has had a  knee block. He is otherwise neurologically intact and left lower extremity his right lower extremity is normal appearance and physical exam with good pulses in the ankle.  Assessment/Plan Impression left knee medial MCL sprain healed a subchondral fracture the tibial plateau with findings of a tear of his medial meniscus and lateral meniscus. He does have a central compression of the articulating surface of the lateral plateau possible on chronic changes verses degenerative arthritic changes.  Plan arthroscopic evaluation of his left knee with debridement of menisci and chondroplasty as needed  [name] W 10/27/2011, 1:49 PM

## 2011-10-27 NOTE — Anesthesia Procedure Notes (Addendum)
  Narrative:    Procedure Name: LMA Insertion Date/Time: 10/27/2011 2:47 PM Performed by: Maris Berger T Pre-anesthesia Checklist: Patient identified, Emergency Drugs available, Suction available and Patient being monitored Patient Re-evaluated:Patient Re-evaluated prior to inductionOxygen Delivery Method: Circle System Utilized Preoxygenation: Pre-oxygenation with 100% oxygen Intubation Type: IV induction Ventilation: Mask ventilation without difficulty LMA: LMA inserted LMA Size: 5.0 Number of attempts: 1 Airway Equipment and Method: bite block Placement Confirmation: positive ETCO2 Dental Injury: Teeth and Oropharynx as per pre-operative assessment     Left knee intra-articular block under local anesthesia with IV sedation for arthroscopic procedure of knee. Time out performed. Routine prep of left knee followed by 10cc of 1% xylocaine SQ local anesthesia. A 18 gauge needle inserted intra-articular into left knee followed by the injection of 30cc .25% Bupivacaine with Epi 1: 200,000; injected in 5 cc dose intervals. VSS. No complications noted. IV sedation with 2cc of Sublimaze in addition to 2mg  of Versed prior to procedure. Coralyn Mark MD

## 2011-10-27 NOTE — Anesthesia Postprocedure Evaluation (Signed)
  Anesthesia Post-op Note  Patient: Travis Myers  Procedure(s) Performed: Procedure(s) (LRB): KNEE ARTHROSCOPY WITH MEDIAL MENISECTOMY ()  Patient Location: PACU  Anesthesia Type: General  Level of Consciousness: awake and alert   Airway and Oxygen Therapy: Patient Spontanous Breathing  Post-op Pain: mild  Post-op Assessment: Post-op Vital signs reviewed, Patient's Cardiovascular Status Stable, Respiratory Function Stable, Patent Airway and No signs of Nausea or vomiting  Post-op Vital Signs: stable  Complications: No apparent anesthesia complications

## 2011-10-27 NOTE — Anesthesia Preprocedure Evaluation (Addendum)
Anesthesia Evaluation  Patient identified by MRN, date of birth, ID band Patient awake    Reviewed: Allergy & Precautions, H&P , NPO status , Patient's Chart, lab work & pertinent test results  Airway Mallampati: II TM Distance: >3 FB Neck ROM: Full    Dental  (+) Teeth Intact and Dental Advisory Given   Pulmonary sleep apnea (Noncompliant with CPAP) ,  breath sounds clear to auscultation  Pulmonary exam normal       Cardiovascular hypertension, Pt. on medications + Valvular Problems/Murmurs Rhythm:Regular Rate:Normal  Dx. 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 Repeat CT 9/13 reveals aneurysm unchanged from prior evaluation.   Neuro/Psych negative neurological ROS  negative psych ROS   GI/Hepatic negative GI ROS, Neg liver ROS,   Endo/Other  Morbid obesity  Renal/GU negative Renal ROS  negative genitourinary   Musculoskeletal negative musculoskeletal ROS (+)   Abdominal   Peds  Hematology negative hematology ROS (+)   Anesthesia Other Findings   Reproductive/Obstetrics negative OB ROS                         Anesthesia Physical Anesthesia Plan  ASA: III  Anesthesia Plan: General   Post-op Pain Management:    Induction: Intravenous  Airway Management Planned: LMA  Additional Equipment:   Intra-op Plan:   Post-operative Plan: Extubation in OR  Informed Consent: I have reviewed the patients History and Physical, chart, labs and discussed the procedure including the risks, benefits and alternatives for the proposed anesthesia with the patient or authorized representative who has indicated his/her understanding and acceptance.   Dental advisory given  Plan Discussed with: CRNA  Anesthesia Plan Comments:         Anesthesia Quick Evaluation

## 2011-10-27 NOTE — Transfer of Care (Signed)
Immediate Anesthesia Transfer of Care Note  Patient: Travis Myers  Procedure(s) Performed: Procedure(s) (LRB) with comments: KNEE ARTHROSCOPY WITH MEDIAL MENISECTOMY () - debridement  Patient Location: PACU  Anesthesia Type: General  Level of Consciousness: sedated  Airway & Oxygen Therapy: Patient Spontanous Breathing and Patient connected to face mask oxygen  Post-op Assessment: Report given to PACU RN  Post vital signs: Reviewed and stable  Complications: No apparent anesthesia complications

## 2012-04-04 ENCOUNTER — Other Ambulatory Visit (HOSPITAL_COMMUNITY): Payer: Self-pay | Admitting: Cardiovascular Disease

## 2012-04-04 DIAGNOSIS — I359 Nonrheumatic aortic valve disorder, unspecified: Secondary | ICD-10-CM

## 2012-08-17 ENCOUNTER — Other Ambulatory Visit (HOSPITAL_COMMUNITY): Payer: Self-pay

## 2012-08-27 ENCOUNTER — Ambulatory Visit (HOSPITAL_COMMUNITY): Admission: RE | Admit: 2012-08-27 | Payer: Self-pay | Source: Ambulatory Visit | Admitting: Orthopedic Surgery

## 2012-08-27 ENCOUNTER — Encounter (HOSPITAL_COMMUNITY): Admission: RE | Payer: Self-pay | Source: Ambulatory Visit

## 2012-08-27 SURGERY — TOTAL HIP REVISION
Anesthesia: Choice | Site: Hip | Laterality: Left

## 2012-09-11 ENCOUNTER — Ambulatory Visit (HOSPITAL_COMMUNITY)
Admission: RE | Admit: 2012-09-11 | Discharge: 2012-09-11 | Disposition: A | Discharge status: Home / Self Care | Payer: BC Managed Care – PPO | Source: Ambulatory Visit | Attending: Cardiovascular Disease | Admitting: Cardiovascular Disease

## 2012-09-11 DIAGNOSIS — I10 Essential (primary) hypertension: Secondary | ICD-10-CM | POA: Insufficient documentation

## 2012-09-11 DIAGNOSIS — I714 Abdominal aortic aneurysm, without rupture, unspecified: Secondary | ICD-10-CM | POA: Insufficient documentation

## 2012-09-11 DIAGNOSIS — G4733 Obstructive sleep apnea (adult) (pediatric): Secondary | ICD-10-CM | POA: Insufficient documentation

## 2012-09-11 DIAGNOSIS — E669 Obesity, unspecified: Secondary | ICD-10-CM | POA: Insufficient documentation

## 2012-09-11 DIAGNOSIS — I359 Nonrheumatic aortic valve disorder, unspecified: Secondary | ICD-10-CM | POA: Insufficient documentation

## 2012-09-11 DIAGNOSIS — E785 Hyperlipidemia, unspecified: Secondary | ICD-10-CM | POA: Insufficient documentation

## 2012-09-11 HISTORY — PX: TRANSTHORACIC ECHOCARDIOGRAM: SHX275

## 2012-09-11 NOTE — Progress Notes (Signed)
[facility]   2D echo completed 09/11/2012.   Cindy Ajay Strubel, RDCS  

## 2012-09-27 ENCOUNTER — Telehealth: Payer: Self-pay | Admitting: Cardiovascular Disease

## 2012-09-27 ENCOUNTER — Other Ambulatory Visit: Payer: Self-pay | Admitting: *Deleted

## 2012-09-27 ENCOUNTER — Other Ambulatory Visit: Payer: Self-pay | Admitting: Cardiovascular Disease

## 2012-09-27 DIAGNOSIS — I712 Thoracic aortic aneurysm, without rupture: Secondary | ICD-10-CM

## 2012-09-27 NOTE — Telephone Encounter (Signed)
Has question about CT order that was placed.  The last order for for CTA of chest and the current one is for CT of chest.

## 2012-09-27 NOTE — Telephone Encounter (Signed)
CT of chest w/contrast changed to CT angiogram w/contrast

## 2012-09-28 ENCOUNTER — Other Ambulatory Visit: Payer: Self-pay | Admitting: Cardiovascular Disease

## 2012-09-28 ENCOUNTER — Inpatient Hospital Stay: Admission: RE | Admit: 2012-09-28 | Payer: Self-pay | Source: Ambulatory Visit

## 2012-09-28 ENCOUNTER — Other Ambulatory Visit: Payer: Self-pay

## 2012-09-28 LAB — COMPREHENSIVE METABOLIC PANEL
Albumin: 4.7 g/dL (ref 3.5–5.2)
Alkaline Phosphatase: 56 U/L (ref 39–117)
BUN: 23 mg/dL (ref 6–23)
CO2: 28 mEq/L (ref 19–32)
Glucose, Bld: 93 mg/dL (ref 70–99)
Sodium: 139 mEq/L (ref 135–145)
Total Bilirubin: 0.7 mg/dL (ref 0.3–1.2)
Total Protein: 6.7 g/dL (ref 6.0–8.3)

## 2012-09-28 LAB — HEMOGLOBIN A1C
Hgb A1c MFr Bld: 5.5 % (ref <5.7)
Mean Plasma Glucose: 111 mg/dL (ref <117)

## 2012-09-28 LAB — MAGNESIUM: Magnesium: 2 mg/dL (ref 1.5–2.5)

## 2012-10-01 ENCOUNTER — Other Ambulatory Visit: Payer: Self-pay

## 2012-10-02 ENCOUNTER — Ambulatory Visit
Admission: RE | Admit: 2012-10-02 | Discharge: 2012-10-02 | Disposition: A | Discharge status: Home / Self Care | Payer: BC Managed Care – PPO | Source: Ambulatory Visit | Attending: Cardiovascular Disease | Admitting: Cardiovascular Disease

## 2012-10-02 DIAGNOSIS — I712 Thoracic aortic aneurysm, without rupture: Secondary | ICD-10-CM

## 2012-10-02 MED ORDER — IOHEXOL 350 MG/ML SOLN
75.0000 mL | Freq: Once | INTRAVENOUS | Status: AC | PRN
  Administered 2012-10-02: 75 mL via INTRAVENOUS

## 2012-10-03 ENCOUNTER — Telehealth: Payer: Self-pay | Admitting: Cardiovascular Disease

## 2012-10-03 NOTE — Telephone Encounter (Signed)
Wants CT Scan results from yesterday-had it [facility].They told him to call today after 2 and we should have the results.

## 2012-10-03 NOTE — Telephone Encounter (Signed)
Returned call and spoke w/ pt.  Informed results have not been reviewed by MD and nurse will call or mail letter after they are reviewed.  Pt verbalized understanding and agreed w/ plan.

## 2012-10-05 ENCOUNTER — Telehealth: Payer: Self-pay | Admitting: Cardiovascular Disease

## 2012-10-05 NOTE — Telephone Encounter (Signed)
Forwarded message to [facility]

## 2012-10-05 NOTE — Telephone Encounter (Signed)
Dr. [name] would like for him to increase his Lipitor and his provider from [facility] in [place] , Dr. Lonie Peak would like that in writing from Dr. [name] before they double up on his meds. The fax number to send that is [phone]   Thanks

## 2012-10-09 ENCOUNTER — Encounter: Payer: Self-pay | Admitting: Cardiovascular Disease

## 2012-10-09 NOTE — Telephone Encounter (Signed)
Last office note sent to fax [phone]

## 2013-03-14 ENCOUNTER — Encounter: Payer: Self-pay | Admitting: Cardiovascular Disease

## 2013-03-14 ENCOUNTER — Ambulatory Visit (INDEPENDENT_AMBULATORY_CARE_PROVIDER_SITE_OTHER): Payer: BC Managed Care – PPO | Admitting: Cardiovascular Disease

## 2013-03-14 VITALS — BP 150/98 | HR 62 | Ht 76.0 in | Wt 313.0 lb

## 2013-03-14 DIAGNOSIS — E785 Hyperlipidemia, unspecified: Secondary | ICD-10-CM | POA: Insufficient documentation

## 2013-03-14 DIAGNOSIS — I712 Thoracic aortic aneurysm, without rupture, unspecified: Secondary | ICD-10-CM

## 2013-03-14 DIAGNOSIS — I10 Essential (primary) hypertension: Secondary | ICD-10-CM | POA: Insufficient documentation

## 2013-03-14 NOTE — Patient Instructions (Signed)
Your physician wants you to follow-up in: 1 year with Dr Gwenlyn Found. You will receive a reminder letter in the mail two months in advance. If you don't receive a letter, please call our office to schedule the follow-up appointment.  Dr Gwenlyn Found wants you to have another CT of your thoracic aortic aneurysm in September 2015.

## 2013-03-14 NOTE — Assessment & Plan Note (Signed)
On statin therapy followed by his PCP 

## 2013-03-14 NOTE — Assessment & Plan Note (Signed)
Mildly elevated today on antihypertensive medications

## 2013-03-14 NOTE — Progress Notes (Signed)
03/14/2013 Travis Myers   1955-09-25  [id number]  Primary Physician [name], MD Primary Cardiologist: [name] MD Renae Gloss   HPI:  Mr. Travis Myers is a 58 year old moderately overweight married Caucasian male with no children who works in the Transport planner at [employer]. He was previously a patient of Dr. Georgiann Mccoy. I am assuming his care. His primary care provider is Cyndi Bender he PA-C at [facility]. He has a history of treated hypertension and hyperlipidemia. There is no family history. He has never had a heart attack or stroke. He denies chest pain or shortness of breath. He does have a thoracic aortic aneurysm measuring 100mm by CT angiogram performed in September last year. This is followed on an annual basis.   Current Outpatient Prescriptions  Medication Sig Dispense Refill  . acetaminophen (TYLENOL) 500 MG tablet Take 1,000 mg by mouth every 6 (six) hours as needed. Pain      . atorvastatin (LIPITOR) 10 MG tablet Take 10 mg by mouth every morning.       . furosemide (LASIX) 40 MG tablet Take 40 mg by mouth every morning.       Marland Kitchen ibuprofen (ADVIL,MOTRIN) 200 MG tablet Take 200 mg by mouth every 6 (six) hours as needed.      Marland Kitchen lisinopril (PRINIVIL,ZESTRIL) 40 MG tablet Take 40 mg by mouth every morning.       . traMADol (ULTRAM) 50 MG tablet Take 50 mg by mouth every 6 (six) hours as needed.       No current facility-administered medications for this visit.    Allergies  Allergen Reactions  . Penicillins Other (See Comments)    Unknown reaction    History   Social History  . Marital Status: Married    Spouse Name: Travis Myers    Number of Children: Travis Myers  . Years of Education: Travis Myers   Occupational History  . Not on file.   Social History Main Topics  . Smoking status: Never Smoker   . Smokeless tobacco: Never Used  . Alcohol Use: Yes     Comment: RARE  . Drug Use: No  . Sexual Activity:    Other Topics Concern    . Not on file   Social History Narrative  . No narrative on file     Review of Systems: General: negative for chills, fever, night sweats or weight changes.  Cardiovascular: negative for chest pain, dyspnea on exertion, edema, orthopnea, palpitations, paroxysmal nocturnal dyspnea or shortness of breath Dermatological: negative for rash Respiratory: negative for cough or wheezing Urologic: negative for hematuria Abdominal: negative for nausea, vomiting, diarrhea, bright red blood per rectum, melena, or hematemesis Neurologic: negative for visual changes, syncope, or dizziness All other systems reviewed and are otherwise negative except as noted above.    Blood pressure 150/98, pulse 62, height 6\' 4"  (1.93 m), weight 141.976 kg (313 lb).  General appearance: alert and no distress Neck: no adenopathy, no carotid bruit, no JVD, supple, symmetrical, trachea midline and thyroid not enlarged, symmetric, no tenderness/mass/nodules Lungs: clear to auscultation bilaterally Heart: regular rate and rhythm, S1, S2 normal, no murmur, click, rub or gallop Extremities: extremities normal, atraumatic, no cyanosis or edema and 2+ pedal pulses  EKG normal sinus rhythm at 62 without ST or T wave changes  ASSESSMENT AND PLAN:   Thoracic aortic aneurysm CT angiogram performed this past September revealed the aneurysm to measure 48 mm. We will continue to follow this on  an annual basis.  Essential hypertension Mildly elevated today on antihypertensive medications  Hyperlipidemia On statin therapy followed by his PCP      [name] MD [facility] 03/14/2013 1:57 PM

## 2013-03-14 NOTE — Assessment & Plan Note (Signed)
CT angiogram performed this past September revealed the aneurysm to measure 48 mm. We will continue to follow this on an annual basis.

## 2013-09-12 ENCOUNTER — Telehealth: Payer: Self-pay | Admitting: Cardiovascular Disease

## 2013-09-12 NOTE — Telephone Encounter (Signed)
Left message for patient to call regarding scheduling CTA of chest that was ordered by Dr. Gwenlyn Found

## 2013-09-18 NOTE — Telephone Encounter (Signed)
Spoke with patient regarding appointment for CTA chest for thoracic aortic aneurysm.   Appointment scheduled for 10/04/13 @ 9:00 am---arrival time at radiology at [facility] is 8:45am---clear liquids only 4 hrs prior to the exam.  Patient states that he will have his PCP fax lab results from 1 month ago to [place].   Patient voiced understanding.

## 2013-09-25 ENCOUNTER — Other Ambulatory Visit: Payer: Self-pay | Admitting: *Deleted

## 2013-10-01 ENCOUNTER — Other Ambulatory Visit: Payer: Self-pay | Admitting: Cardiovascular Disease

## 2013-10-01 ENCOUNTER — Other Ambulatory Visit: Payer: Self-pay | Admitting: *Deleted

## 2013-10-01 DIAGNOSIS — Z79899 Other long term (current) drug therapy: Secondary | ICD-10-CM

## 2013-10-01 LAB — CREATININE, SERUM: Creat: 1.01 mg/dL (ref 0.50–1.35)

## 2013-10-01 LAB — BUN: BUN: 19 mg/dL (ref 6–23)

## 2013-10-01 LAB — BUN+CREAT: BUN/Creatinine Ratio: 18.8 Ratio

## 2013-10-03 ENCOUNTER — Encounter: Payer: Self-pay | Admitting: *Deleted

## 2013-10-04 ENCOUNTER — Ambulatory Visit (HOSPITAL_COMMUNITY)
Admission: RE | Admit: 2013-10-04 | Discharge: 2013-10-04 | Disposition: A | Discharge status: Home / Self Care | Payer: BC Managed Care – PPO | Source: Ambulatory Visit | Attending: Cardiovascular Disease | Admitting: Cardiovascular Disease

## 2013-10-04 ENCOUNTER — Encounter (HOSPITAL_COMMUNITY): Payer: Self-pay

## 2013-10-04 DIAGNOSIS — I712 Thoracic aortic aneurysm, without rupture, unspecified: Secondary | ICD-10-CM | POA: Diagnosis present

## 2013-10-04 MED ORDER — IOHEXOL 350 MG/ML SOLN
100.0000 mL | Freq: Once | INTRAVENOUS | Status: AC | PRN
  Administered 2013-10-04: 100 mL via INTRAVENOUS

## 2014-01-07 IMAGING — CT CT ANGIO CHEST
3 of 6 series · 19 of 36 positions shown · IV contrast (80CC OMNI 350)
Comparison: 06/16/2009

CLINICAL DATA: Aortic aneurysm, pulmonary nodules.

CT ANGIOGRAPHY CHEST WITH CONTRAST
TECHNIQUE: Multidetector CT imaging of the chest was performed
using the standard protocol during bolus administration of
intravenous contrast.  Multiplanar CT image reconstructions
including MIPs were obtained to evaluate the vascular anatomy.
Contrast: 80mL OMNIPAQUE IOHEXOL 350 MG/ML SOLN

[Series 4: angio · axial · 0.86mm/px · z∈[-290,-8]mm · 8 of 147 slices shown]
[im 17/147  lung]
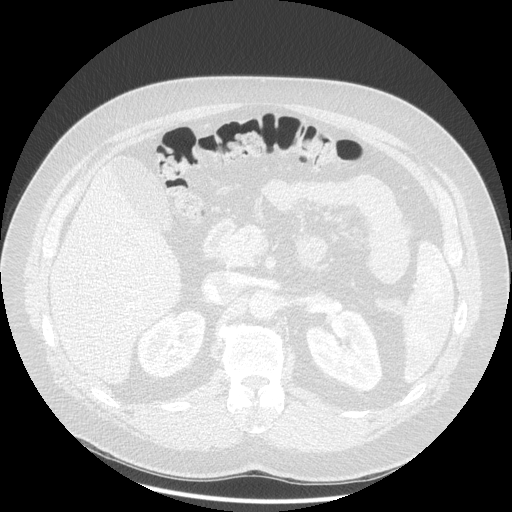
[im 33/147  mediastinal]
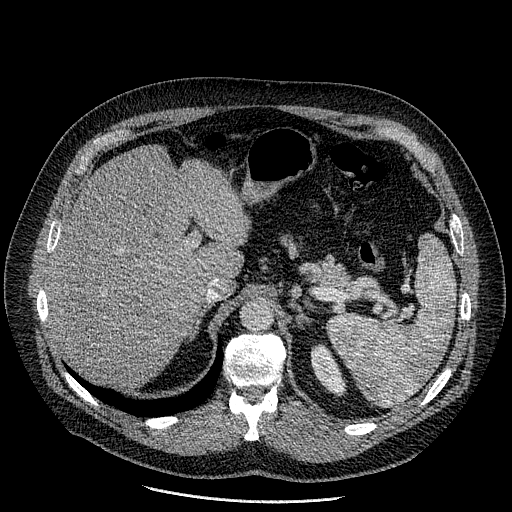
[im 49/147  lung]
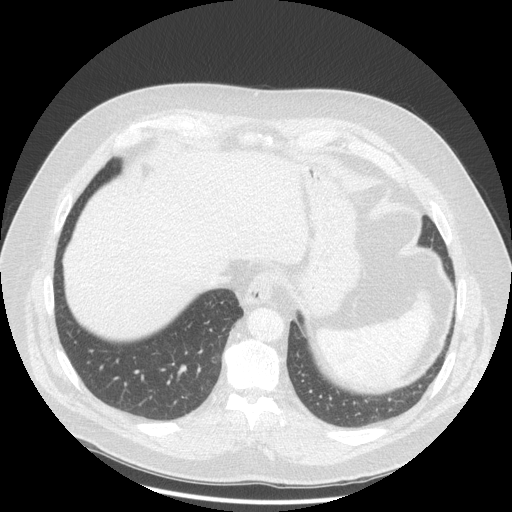
[im 65/147  mediastinal]
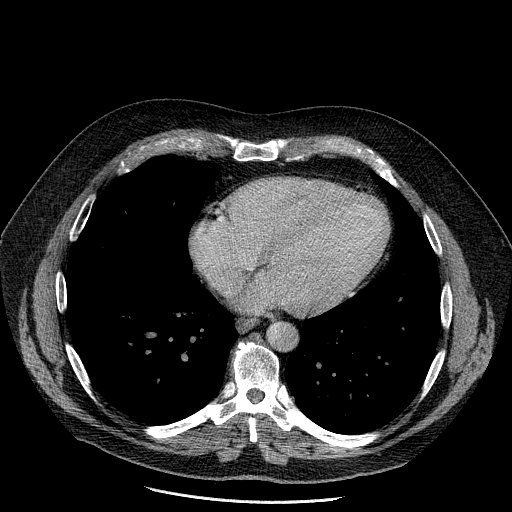
[im 82/147  lung]
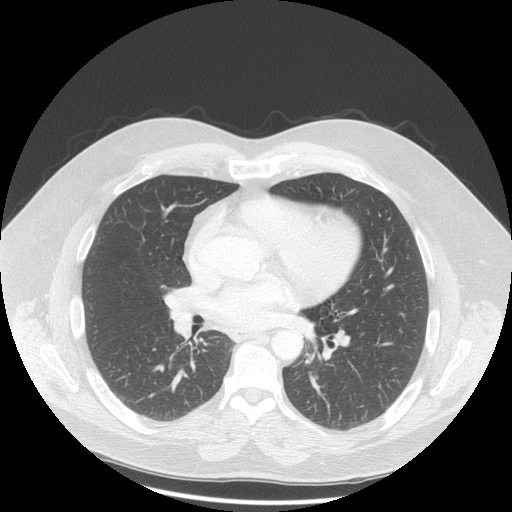
[im 98/147  mediastinal]
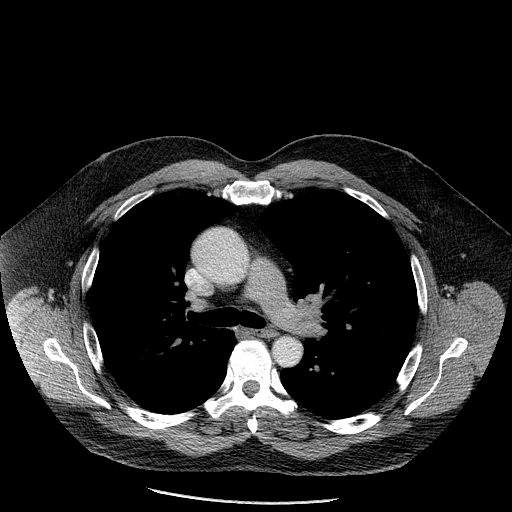
[im 114/147  lung]
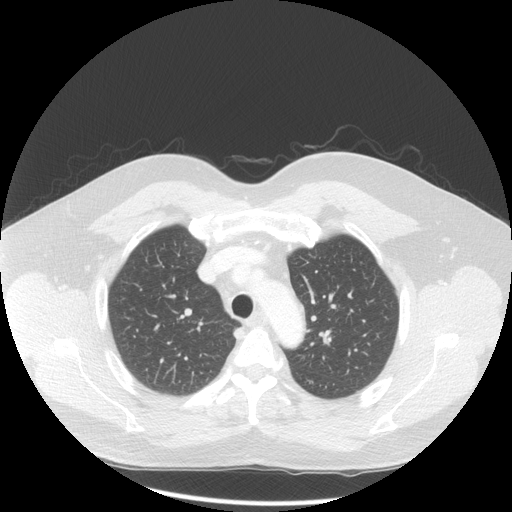
[im 130/147  mediastinal]
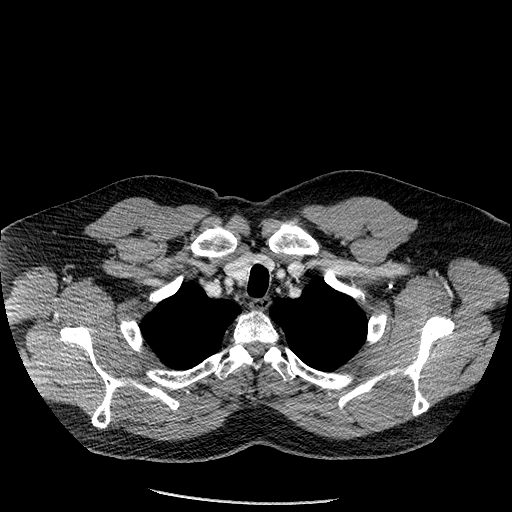

[Series 5: lung windows · axial · 0.86mm/px · z∈[-216,-50]mm · 3 of 67 slices shown]
[im 17/67  mediastinal]
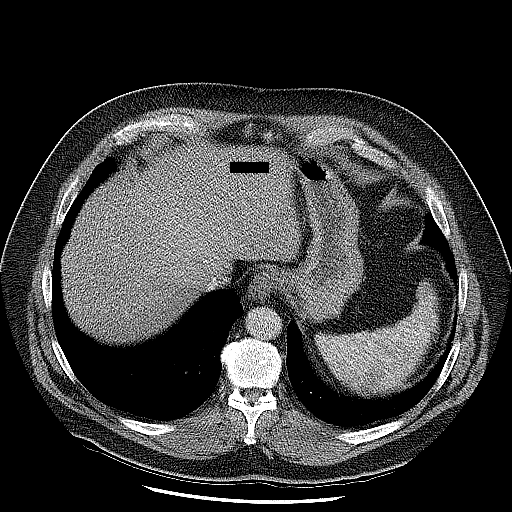
[im 34/67  mediastinal]
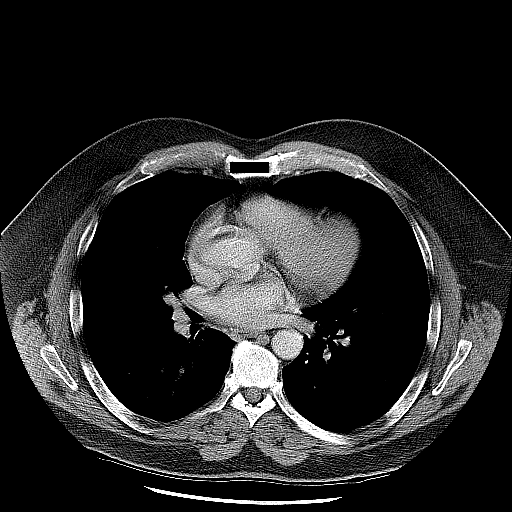
[im 50/67  mediastinal]
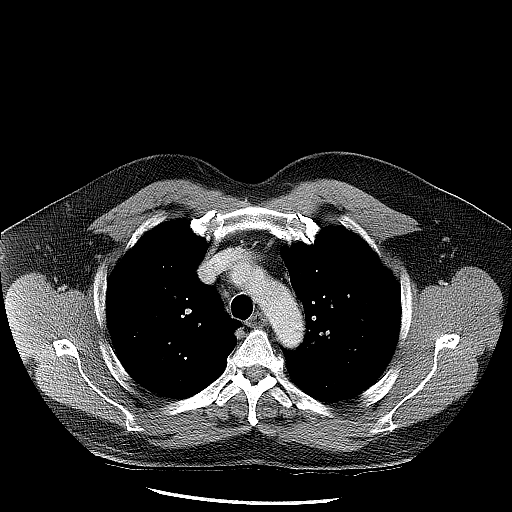

[Series 602: sagittal body · sagittal · 0.86mm/px · 8 of 177 slices shown]
[im 15/177  lung]
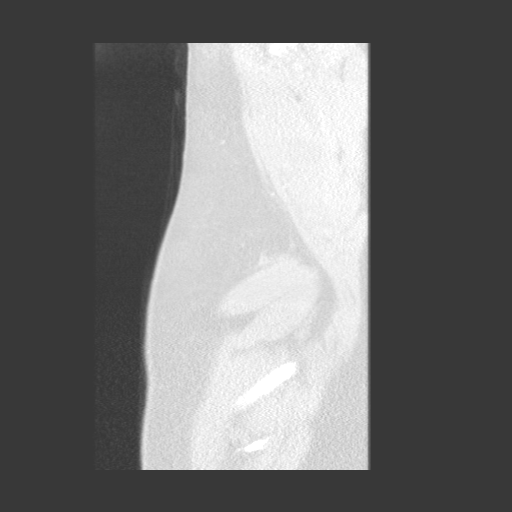
[im 45/177  lung]
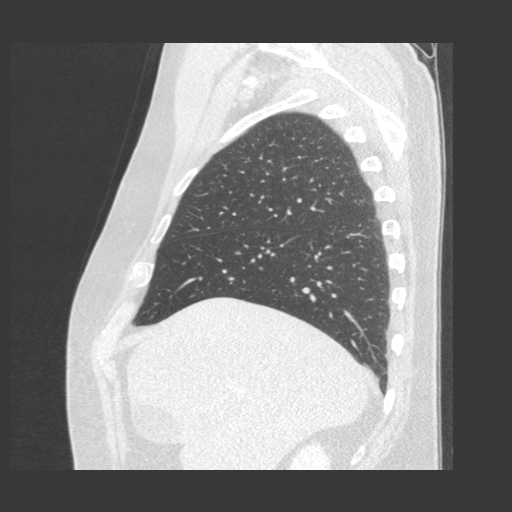
[im 59/177  lung]
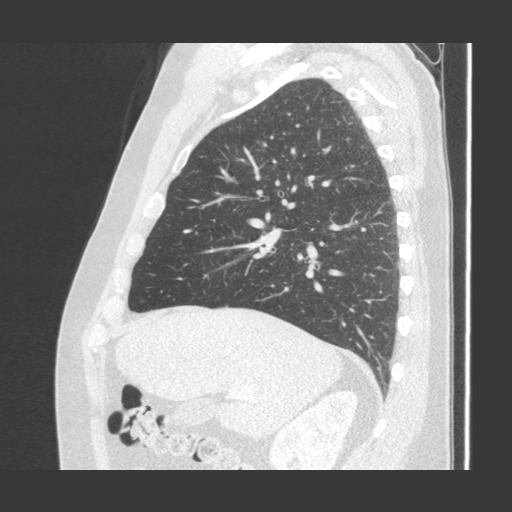
[im 74/177  lung]
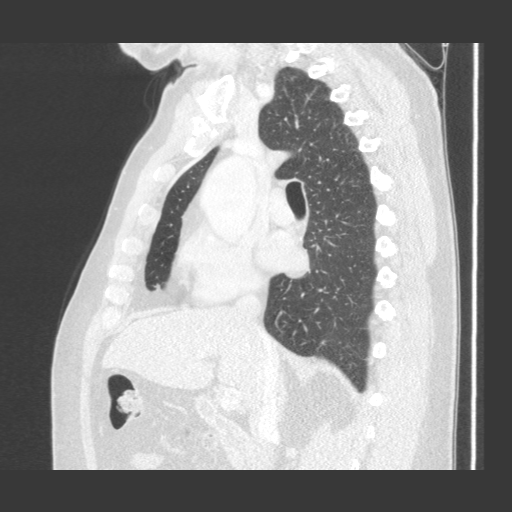
[im 103/177  lung]
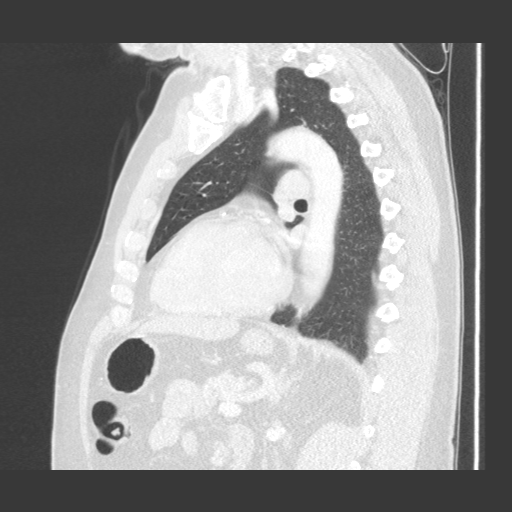
[im 118/177  lung]
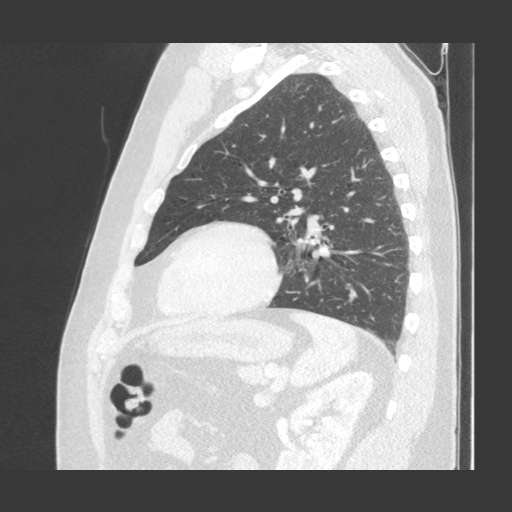
[im 133/177  lung]
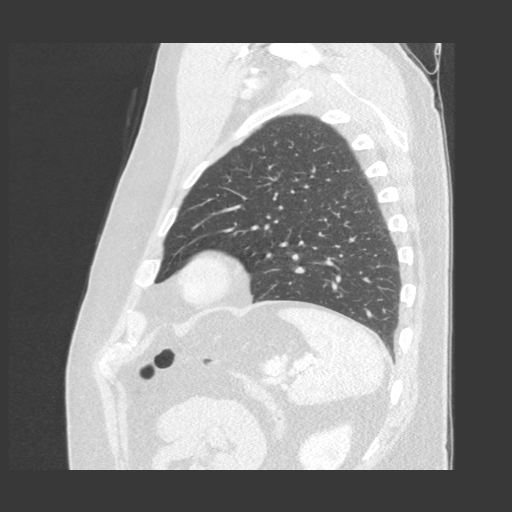
[im 162/177  lung]
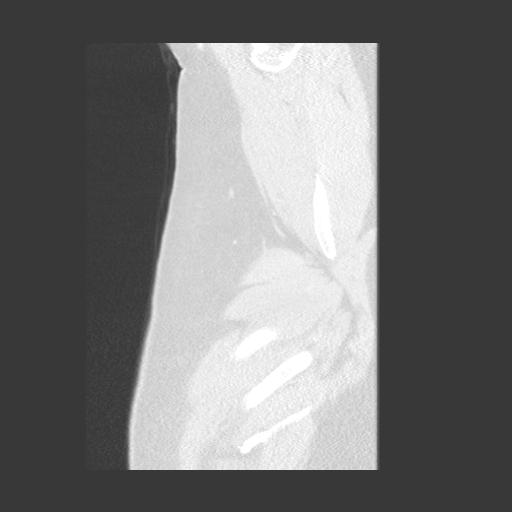

[19 of 36 positions shown; findings below may reference images not displayed]

FINDINGS: Patchy coronary calcifications.  Proximal ascending
aorta 4.1 cm diameter, distal ascending 4.8 cm, proximal arch
cm, distal arch 2.9 cm, proximal descending   2.6 cm, distal
descending 2.8 cm just above the diaphragm.  No evidence of
dissection or stenosis.  No significant atheromatous irregularity.

No pleural or pericardial effusion.  No hilar or mediastinal
adenopathy.  Atrophic or surgically absent left lobe of the
thyroid.  Tiny subpleural granuloma laterally in the superior
segment left lower lobe.  Lungs otherwise clear.  Visualized
portions of upper abdomen unremarkable.  Mild spondylitic changes
in the mid thoracic spine.

Review of the MIP images confirms the above findings.
IMPRESSION: 1.  Stable 4.8 cm ascending aortic aneurysm without complicating
features.
2. Atherosclerosis, including [...] coronary artery disease. Please
note that although the presence of coronary artery calcium
documents the presence of coronary artery disease, the severity of
this disease and any potential stenosis cannot be assessed on this
non-gated CT examination.  Assessment for potential risk factor
modification, dietary therapy or pharmacologic therapy may be
warranted, if clinically indicated.

## 2014-01-23 ENCOUNTER — Encounter (HOSPITAL_BASED_OUTPATIENT_CLINIC_OR_DEPARTMENT_OTHER): Payer: Self-pay | Admitting: Specialist

## 2014-02-13 ENCOUNTER — Telehealth: Payer: Self-pay | Admitting: Cardiovascular Disease

## 2014-02-13 NOTE — Telephone Encounter (Signed)
Close encounter 

## 2014-02-14 ENCOUNTER — Telehealth: Payer: Self-pay | Admitting: *Deleted

## 2014-02-14 DIAGNOSIS — Z01818 Encounter for other preprocedural examination: Secondary | ICD-10-CM

## 2014-02-14 NOTE — Telephone Encounter (Signed)
Needs a pharmacologic Myoview stress test today for preoperative clearance

## 2014-02-14 NOTE — Telephone Encounter (Signed)
Patient is scheduled for a Right Hip THA-AA on 03/11/2014 with Dr. Salli Quarry. He is requesting clearance.

## 2014-02-14 NOTE — Telephone Encounter (Signed)
Order placed. Message sent to admin pool to have study scheduled.

## 2014-02-17 ENCOUNTER — Telehealth (HOSPITAL_COMMUNITY): Payer: Self-pay | Admitting: *Deleted

## 2014-02-18 NOTE — Telephone Encounter (Signed)
Pt scheduled for Lexiscan on 02/28/14.

## 2014-02-26 ENCOUNTER — Telehealth (HOSPITAL_COMMUNITY): Payer: Self-pay

## 2014-02-26 NOTE — Telephone Encounter (Signed)
Encounter complete. 

## 2014-02-27 ENCOUNTER — Telehealth (HOSPITAL_COMMUNITY): Payer: Self-pay

## 2014-02-27 NOTE — Telephone Encounter (Signed)
Encounter complete. 

## 2014-02-28 ENCOUNTER — Encounter: Payer: Self-pay | Admitting: Physician Assistant

## 2014-02-28 ENCOUNTER — Ambulatory Visit (INDEPENDENT_AMBULATORY_CARE_PROVIDER_SITE_OTHER): Payer: BC Managed Care – PPO | Admitting: Physician Assistant

## 2014-02-28 ENCOUNTER — Ambulatory Visit (HOSPITAL_COMMUNITY)
Admission: RE | Admit: 2014-02-28 | Discharge: 2014-02-28 | Disposition: A | Discharge status: Home / Self Care | Payer: BC Managed Care – PPO | Source: Ambulatory Visit | Attending: Cardiology | Admitting: Cardiology

## 2014-02-28 VITALS — BP 146/80 | HR 67 | Ht 76.0 in | Wt 307.3 lb

## 2014-02-28 DIAGNOSIS — E669 Obesity, unspecified: Secondary | ICD-10-CM | POA: Insufficient documentation

## 2014-02-28 DIAGNOSIS — E785 Hyperlipidemia, unspecified: Secondary | ICD-10-CM | POA: Diagnosis not present

## 2014-02-28 DIAGNOSIS — R5383 Other fatigue: Secondary | ICD-10-CM | POA: Diagnosis not present

## 2014-02-28 DIAGNOSIS — Z01818 Encounter for other preprocedural examination: Secondary | ICD-10-CM

## 2014-02-28 DIAGNOSIS — I714 Abdominal aortic aneurysm, without rupture, unspecified: Secondary | ICD-10-CM

## 2014-02-28 DIAGNOSIS — I10 Essential (primary) hypertension: Secondary | ICD-10-CM | POA: Diagnosis not present

## 2014-02-28 DIAGNOSIS — I712 Thoracic aortic aneurysm, without rupture, unspecified: Secondary | ICD-10-CM

## 2014-02-28 DIAGNOSIS — R42 Dizziness and giddiness: Secondary | ICD-10-CM | POA: Insufficient documentation

## 2014-02-28 DIAGNOSIS — Z79899 Other long term (current) drug therapy: Secondary | ICD-10-CM

## 2014-02-28 MED ORDER — TECHNETIUM TC 99M SESTAMIBI GENERIC - CARDIOLITE
31.0000 | Freq: Once | INTRAVENOUS | Status: AC | PRN
  Administered 2014-02-28: 31 via INTRAVENOUS

## 2014-02-28 MED ORDER — AMINOPHYLLINE 25 MG/ML IV SOLN
100.0000 mg | Freq: Once | INTRAVENOUS | Status: AC
  Administered 2014-02-28: 100 mg via INTRAVENOUS

## 2014-02-28 MED ORDER — REGADENOSON 0.4 MG/5ML IV SOLN
0.4000 mg | Freq: Once | INTRAVENOUS | Status: AC
  Administered 2014-02-28: 0.4 mg via INTRAVENOUS

## 2014-02-28 MED ORDER — TECHNETIUM TC 99M SESTAMIBI GENERIC - CARDIOLITE
10.7000 | Freq: Once | INTRAVENOUS | Status: AC | PRN
  Administered 2014-02-28: 11 via INTRAVENOUS

## 2014-02-28 NOTE — Assessment & Plan Note (Signed)
We completed a Lexiscan Myoview stress test today. Review results.

## 2014-02-28 NOTE — Assessment & Plan Note (Signed)
No change on September CT scan. Rescan next September

## 2014-02-28 NOTE — H&P (Signed)
TOTAL HIP ADMISSION H&P  Patient is admitted for right total hip arthroplasty, anterior approach.  Subjective:  Chief Complaint:     Right hip primary OA / pain  HPI: Warner Mccreedy II, 59 y.o. male, has a history of pain and functional disability in the right hip(s) due to arthritis and patient has failed non-surgical conservative treatments for greater than 12 weeks to include NSAID's and/or analgesics and activity modification.  Onset of symptoms was gradual starting 1.5+ years ago with gradually worsening course since that time.The patient noted prior procedures of the hip to include arthroplasty on the left hip about 5 years ago.  Patient currently rates pain in the right hip at 9 out of 10 with activity. Patient has night pain, worsening of pain with activity and weight bearing, trendelenberg gait, pain that interfers with activities of daily living and pain with passive range of motion. Patient has evidence of periarticular osteophytes and joint space narrowing by imaging studies. This condition presents safety issues increasing the risk of falls.   There is no current active infection.   Risks, benefits and expectations were discussed with the patient.  Risks including but not limited to the risk of anesthesia, blood clots, nerve damage, blood vessel damage, failure of the prosthesis, infection and up to and including death.  Patient understand the risks, benefits and expectations and wishes to proceed with surgery.   PCP: [name], MD  D/C Plans:      Home with HHPT  Post-op Meds:       No Rx given  Tranexamic Acid:      To be given - IV  Decadron:      Is to be given  FYI:     ASA post-op  Norco post-op    Patient Active Problem List   Diagnosis Date Noted  . Thoracic aortic aneurysm 03/14/2013  . Essential hypertension 03/14/2013  . Hyperlipidemia 03/14/2013   Past Medical History  Diagnosis Date  . Hypertension   . Hyperlipidemia   . OA (osteoarthritis) of  knee left  . Acute meniscal tear of knee left  . OSA (obstructive sleep apnea) NON-TOLERANT CPAP  . Heart murmur MILD-- ASYMPTOMATIC  . Tinnitus of both ears   . Aneurysm, ascending aorta 4.8cm per ct 2011/ CARDIOLOGSIT--  DR Rollene Fare  LOV  10-07-2011  NOTE W/ CHART AND REQUEST LEXISCAN MYOVIEW AND ECHO TO BE FAXED.    ASYMPTOMATIC    Past Surgical History  Procedure Laterality Date  . Total hip arthroplasty  06-18-2009    OA LEFT HIP  . Veins removed from left leg  1994  . Bilateral iinguinal hernia repair  1963  . Tonsillectomy  1965  . Transthoracic echocardiogram  09/11/2012    EF 55 to 60 %,mild aortic valve regurg,mild mitral valve regurg,lf and rt atriums mildly dilated  . Doppler echocardiography  10/18/2011    EF >55 %  . Nm myocar perf wall motion  10/13/2011    EF 49 %,low risk study    No prescriptions prior to admission   Allergies  Allergen Reactions  . Penicillins Other (See Comments)    Unknown reaction    History  Substance Use Topics  . Smoking status: Never Smoker   . Smokeless tobacco: Never Used  . Alcohol Use: Yes     Comment: RARE    Family History  Problem Relation Age of Onset  . Diabetes Mother   . Cancer Mother   . Heart failure Other   .  Diabetes Other   . Cancer Other   . COPD Other      Review of Systems  Constitutional: Positive for malaise/fatigue.  HENT: Positive for tinnitus.   Eyes: Negative.   Respiratory: Negative.   Cardiovascular: Negative.   Gastrointestinal: Negative.   Genitourinary: Negative.   Musculoskeletal: Positive for myalgias, back pain and joint pain.  Skin: Negative.   Neurological: Positive for headaches.  Endo/Heme/Allergies: Negative.   Psychiatric/Behavioral: Negative.     Objective:  Physical Exam  Constitutional: He is oriented to person, place, and time. He appears well-developed and well-nourished.  HENT:  Head: Normocephalic and atraumatic.  Eyes: Pupils are equal, round, and reactive to  light.  Neck: Neck supple. No JVD present. No tracheal deviation present. No thyromegaly present.  Cardiovascular: Normal rate, regular rhythm and intact distal pulses.   Murmur heard. Respiratory: Effort normal and breath sounds normal. No stridor. No respiratory distress. He has no wheezes.  GI: Soft. There is no tenderness. There is no guarding.  Musculoskeletal:       Right hip: He exhibits decreased range of motion, decreased strength, tenderness and bony tenderness. He exhibits no swelling, no deformity and no laceration.  Lymphadenopathy:    He has no cervical adenopathy.  Neurological: He is alert and oriented to person, place, and time.  Skin: Skin is warm and dry.  Psychiatric: He has a normal mood and affect.       Labs:  Estimated body mass index is 38.12 kg/(m^2) as calculated from the following:   Height as of 03/14/13: 6\' 4"  (1.93 m).   Weight as of 03/14/13: 141.976 kg (313 lb).   Imaging Review Plain radiographs demonstrate severe degenerative joint disease of the right hip(s). The bone quality appears to be good for age and reported activity level.  Assessment/Plan:  End stage arthritis, right hip(s)  The patient history, physical examination, clinical judgement of the provider and imaging studies are consistent with end stage degenerative joint disease of the right hip(s) and total hip arthroplasty is deemed medically necessary. The treatment options including medical management, injection therapy, arthroscopy and arthroplasty were discussed at length. The risks and benefits of total hip arthroplasty were presented and reviewed. The risks due to aseptic loosening, infection, stiffness, dislocation/subluxation,  thromboembolic complications and other imponderables were discussed.  The patient acknowledged the explanation, agreed to proceed with the plan and consent was signed. Patient is being admitted for inpatient treatment for surgery, pain control, PT, OT,  prophylactic antibiotics, VTE prophylaxis, progressive ambulation and ADL's and discharge planning.The patient is planning to be discharged home with home health services.     [place] Pugh [place]   PA-C  02/28/2014, 10:20 AM

## 2014-02-28 NOTE — Assessment & Plan Note (Signed)
Blood pressure ranges in the 120s to 130s over 80 at home. No changes to current therapy

## 2014-02-28 NOTE — Assessment & Plan Note (Signed)
Continue statin. 

## 2014-02-28 NOTE — Progress Notes (Signed)
Date:  02/28/2014   ID:  Warner Mccreedy II, DOB 05/18/1955, MRN [id number]  PCP:  [name], MD  Primary Cardiologist:  Gwenlyn Found  Chief Complaint  Patient presents with  . Follow-up    Needs cardiac clearance for right hip replacement Dr. Alvan Dame 03/11/2014.  Had Myoview today.  No complaints of chest pain, SOB or edema.  Occas. has positiional lightheadedness.     History of Present Illness: ARMIN YERGER II is a 59 y.o. 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 His primary care provider is Cyndi Bender he PA-C at [facility]. He has a history of treated hypertension and hyperlipidemia. There is no family history. He has never had a heart attack or stroke. He denies chest pain or shortness of breath. He does have a thoracic aortic aneurysm measuring 23mm by CT angiogram performed in September last year. This is followed on an annual basis.  Patient presents for preop clearance. He needs to have a hip replacement.  He underwent a nuclear stress test just prior to coming into the exam room.  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 He's regularly picking up bales of hay without any chest discomfort. His EKG is unremarkable. He does get some lower extremity edema which resolves overnight.  The patient currently denies nausea, vomiting, fever, chest pain, shortness of breath, orthopnea, dizziness, PND, cough, congestion, abdominal pain, hematochezia, melena, claudication.  Wt Readings from Last 3 Encounters:  02/28/14 307 lb 4.8 oz (139.39 kg)  03/14/13 313 lb (141.976 kg)  10/27/11 299 lb (135.626 kg)     Past Medical History  Diagnosis Date  . Hypertension   . Hyperlipidemia   . OA (osteoarthritis) of knee left  . Acute meniscal tear of knee left  . OSA (obstructive sleep apnea) NON-TOLERANT CPAP  . Heart murmur MILD-- ASYMPTOMATIC  .  Tinnitus of both ears   . Aneurysm, ascending aorta 4.8cm per ct 2011/ CARDIOLOGSIT--  DR Rollene Fare  LOV  10-07-2011  NOTE W/ CHART AND REQUEST LEXISCAN MYOVIEW AND ECHO TO BE FAXED.    ASYMPTOMATIC    Current Outpatient Prescriptions  Medication Sig Dispense Refill  . amLODipine (NORVASC) 5 MG tablet Take 5 mg by mouth every morning.    Marland Kitchen atorvastatin (LIPITOR) 10 MG tablet Take 10 mg by mouth every morning.     . furosemide (LASIX) 40 MG tablet Take 40 mg by mouth every morning.     Marland Kitchen HYDROcodone-acetaminophen (NORCO/VICODIN) 5-325 MG per tablet Take 1 tablet by mouth every 6 (six) hours as needed for moderate pain.    Marland Kitchen ibuprofen (ADVIL,MOTRIN) 200 MG tablet Take 800 mg by mouth every 6 (six) hours as needed for mild pain.     . traMADol (ULTRAM) 50 MG tablet Take 50 mg by mouth every 6 (six) hours as needed for moderate pain.      No current facility-administered medications for this visit.    Allergies:    Allergies  Allergen Reactions  . Penicillins Other (See Comments)    Unknown reaction    Social History:  The patient  reports that he has never smoked. He has never used smokeless tobacco. He reports that he drinks alcohol. He reports that he does not use illicit drugs.   Family history:   Family History  Problem Relation Age of  Onset  . Diabetes Mother   . Cancer Mother   . Heart failure Other   . Diabetes Other   . Cancer Other   . COPD Other     ROS:  Please see the history of present illness.  All other systems reviewed and negative.   PHYSICAL EXAM: VS:  BP 146/80 mmHg  Pulse 67  Ht 6\' 4"  (1.93 m)  Wt 307 lb 4.8 oz (139.39 kg)  BMI 37.42 kg/m2 Obese, well developed, in no acute distress HEENT: Pupils are equal round react to light accommodation extraocular movements are intact.  Neck: no JVDNo cervical lymphadenopathy. Cardiac: Regular rate and rhythm 1/6 systolic murmur. Lungs:  clear to auscultation bilaterally, no wheezing, rhonchi or rales Ext: 1+  lower extremity edema.  2+ radial and dorsalis pedis pulses. Skin: warm and dry Neuro:  Grossly normal  EKG:    Normal sinus rhythm rate 67 bpm  ASSESSMENT AND PLAN:  Problem List Items Addressed This Visit    Thoracic aortic aneurysm    No change on September CT scan. Rescan next September      Preoperative clearance    We completed a Lexiscan Myoview stress test today. Review results.      Hyperlipidemia    Continue statin.      Essential hypertension    Blood pressure ranges in the 120s to 130s over 80 at home. No changes to current therapy       Other Visit Diagnoses    AAA (abdominal aortic aneurysm)    -  Primary    Relevant Orders    CT Angio Chest PE W/Cm &/Or Wo Cm    Medication management        Relevant Orders    Basic metabolic panel

## 2014-02-28 NOTE — Procedures (Addendum)
[facility] [address] [place] [address] [phone]  Cardiology Nuclear Med Study  Travis Myers is a 59 y.o. male     MRN : [id number]     DOB: 04-30-1955  Procedure Date: 02/28/2014  Nuclear Med Background Indication for Stress Test:  Surgical Clearance History:  Heart murmur;Thoracic aortic anuerysm;No prior respiratory history reported;No prior NUC MPI for comparison in EPIC. Cardiac Risk Factors: Hypertension, Lipids and Obesity  Symptoms:  Dizziness, Fatigue and Light-Headedness   Nuclear Pre-Procedure Caffeine/Decaff Intake:  1:00am NPO After: 11am   IV Site: R Forearm  IV 0.9% NS with Angio Cath:  22g  Chest Size (in):  54" IV Started by: Rolene Course, RN  Height: 6\' 4"  (1.93 m)  Cup Size: n/a  BMI:  Body mass index is 34.71 kg/(m^2). Weight:  285 lb (129.275 kg)   Tech Comments:  n/a    Nuclear Med Study 1 or 2 day study: 1 day  Stress Test Type:  [place] Provider:  Quay Burow, MD   Resting Radionuclide: Technetium 40m Sestamibi  Resting Radionuclide Dose: 10.7 mCi   Stress Radionuclide:  Technetium 45m Sestamibi  Stress Radionuclide Dose: 31.0 mCi           Stress Protocol Rest HR: 68 Stress HR:86  Rest BP: 153/91 Stress BP: 163/92  Exercise Time (min): n/a METS: n/a          Dose of Adenosine (mg):  n/a Dose of Lexiscan: 0.4 mg  Dose of Atropine (mg): n/a Dose of Dobutamine: n/a mcg/kg/min (at max HR)  Stress Test Technologist: Mellody Memos, CCT Nuclear Technologist: Imagene Riches, CNMT   Rest Procedure:  Myocardial perfusion imaging was performed at rest 45 minutes following the intravenous administration of Technetium 93m Sestamibi. Stress Procedure:  The patient received IV Lexiscan 0.4 mg over 15-seconds.  Technetium 21m Sestamibi injected IV at 30-seconds.  Patient experienced shortness of breath, chest heaviness and was administered 100 mg of  Aminophylline IV. There were no significant changes with Lexiscan.  Quantitative spect images were obtained after a 45 minute delay.  Transient Ischemic Dilatation (Normal <1.22):  1.07  QGS EDV:  187 ml QGS ESV:  92 ml LV Ejection Fraction: 51%    Rest ECG: NSR, occasional PVC.  Stress ECG: No significant ST segment change suggestive of ischemia.  QPS Raw Data Images:  Acquisition technically good; LVE. Stress Images:  There is decreased uptake in the inferior wall. Rest Images:  There is decreased uptake in the inferior wall. Subtraction (SDS):  No evidence of ischemia.  Impression Exercise Capacity:  Lexiscan with no exercise. BP Response:  Normal blood pressure response. Clinical Symptoms:  There is chest heaviness and dyspnea. ECG Impression:  No significant ST segment change suggestive of ischemia. Comparison with Prior Nuclear Study: No previous nuclear study performed  Overall Impression:  Low risk stress nuclear study with a small, mild, fixed inferior defect consistent with inferior thinning; no ischemia; note significant LVE.  LV Wall Motion:  NL LV Function; NL Wall Motion   Kirk Ruths, MD  02/28/2014 5:02 PM

## 2014-02-28 NOTE — Patient Instructions (Signed)
Dr. Gwenlyn Found requests you have a CT Angio of the chest with contrast at [place], [address] In September 2016.  Your physician recommends that you return for lab work in: Prior to the [place] angio.  Your physician recommends that you schedule a follow-up appointment in: Dr. Gwenlyn Found in September 2016.

## 2014-03-03 NOTE — Patient Instructions (Addendum)
Travis Myers  03/03/2014   Your procedure is scheduled on: 03/11/2014    Report to [place] [facility] Main  Entrance and follow signs to               [place] at     0700 AM.  Call this number if you have problems the morning of surgery [phone]   Remember:  Do not eat food or drink liquids :After Midnight.     Take these medicines the morning of surgery with A SIP OF WATER: Amlodipine ( Norvasc )                                You may not have any metal on your body including hair pins and              piercings  Do not wear jewelry, , lotions, powders or perfumes., deodorant.                          Men may shave face and neck.   Do not bring valuables to the hospital. [place].  Contacts, dentures or bridgework may not be worn into surgery.  Leave suitcase in the car. After surgery it may be brought to your room.     Marland Kitchen    Special Instructions: coughing and deep breathing exercises, leg exercises               Please read over the following fact sheets you were given: _____________________________________________________________________             [facility] - Preparing for Surgery Before surgery, you can play an important role.  Because skin is not sterile, your skin needs to be as free of germs as possible.  You can reduce the number of germs on your skin by washing with CHG (chlorahexidine gluconate) soap before surgery.  CHG is an antiseptic cleaner which kills germs and bonds with the skin to continue killing germs even after washing. Please DO NOT use if you have an allergy to CHG or antibacterial soaps.  If your skin becomes reddened/irritated stop using the CHG and inform your nurse when you arrive at Short Stay. Do not shave (including legs and underarms) for at least 48 hours prior to the first CHG shower.  You may shave your face/neck. Please follow these instructions  carefully:  1.  Shower with CHG Soap the night before surgery and the  morning of Surgery.  2.  If you choose to wash your hair, wash your hair first as usual with your  normal  shampoo.  3.  After you shampoo, rinse your hair and body thoroughly to remove the  shampoo.                           4.  Use CHG as you would any other liquid soap.  You can apply chg directly  to the skin and wash                       Gently with a scrungie or clean washcloth.  5.  Apply the CHG Soap to your  body ONLY FROM THE NECK DOWN.   Do not use on face/ open                           Wound or open sores. Avoid contact with eyes, ears mouth and genitals (private parts).                       Wash face,  Genitals (private parts) with your normal soap.             6.  Wash thoroughly, paying special attention to the area where your surgery  will be performed.  7.  Thoroughly rinse your body with warm water from the neck down.  8.  DO NOT shower/wash with your normal soap after using and rinsing off  the CHG Soap.                9.  Pat yourself dry with a clean towel.            10.  Wear clean pajamas.            11.  Place clean sheets on your bed the night of your first shower and do not  sleep with pets. Day of Surgery : Do not apply any lotions/deodorants the morning of surgery.  Please wear clean clothes to the hospital/surgery center.  FAILURE TO FOLLOW THESE INSTRUCTIONS MAY RESULT IN THE CANCELLATION OF YOUR SURGERY PATIENT SIGNATURE_________________________________  NURSE SIGNATURE__________________________________  ________________________________________________________________________  WHAT IS A BLOOD TRANSFUSION? Blood Transfusion Information  A transfusion is the replacement of blood or some of its parts. Blood is made up of multiple cells which provide different functions.  Red blood cells carry oxygen and are used for blood loss replacement.  White blood cells fight against  infection.  Platelets control bleeding.  Plasma helps clot blood.  Other blood products are available for specialized needs, such as hemophilia or other clotting disorders. BEFORE THE TRANSFUSION  Who gives blood for transfusions?   Healthy volunteers who are fully evaluated to make sure their blood is safe. This is blood bank blood. Transfusion therapy is the safest it has ever been in the practice of medicine. Before blood is taken from a donor, a complete history is taken to make sure that person has no history of diseases nor engages in risky social behavior (examples are intravenous drug use or sexual activity with multiple partners). The donor's travel history is screened to minimize risk of transmitting infections, such as malaria. The donated blood is tested for signs of infectious diseases, such as HIV and hepatitis. The blood is then tested to be sure it is compatible with you in order to minimize the chance of a transfusion reaction. If you or a relative donates blood, this is often done in anticipation of surgery and is not appropriate for emergency situations. It takes many days to process the donated blood. RISKS AND COMPLICATIONS Although transfusion therapy is very safe and saves many lives, the main dangers of transfusion include:  1. Getting an infectious disease. 2. Developing a transfusion reaction. This is an allergic reaction to something in the blood you were given. Every precaution is taken to prevent this. The decision to have a blood transfusion has been considered carefully by your caregiver before blood is given. Blood is not given unless the benefits outweigh the risks. AFTER THE TRANSFUSION  Right after receiving a blood transfusion, you will usually feel  much better and more energetic. This is especially true if your red blood cells have gotten low (anemic). The transfusion raises the level of the red blood cells which carry oxygen, and this usually causes an energy  increase.  The nurse administering the transfusion will monitor you carefully for complications. HOME CARE INSTRUCTIONS  No special instructions are needed after a transfusion. You may find your energy is better. Speak with your caregiver about any limitations on activity for underlying diseases you may have. SEEK MEDICAL CARE IF:   Your condition is not improving after your transfusion.  You develop redness or irritation at the intravenous (IV) site. SEEK IMMEDIATE MEDICAL CARE IF:  Any of the following symptoms occur over the next 12 hours:  Shaking chills.  You have a temperature by mouth above 102 F (38.9 C), not controlled by medicine.  Chest, back, or muscle pain.  People around you feel you are not acting correctly or are confused.  Shortness of breath or difficulty breathing.  Dizziness and fainting.  You get a rash or develop hives.  You have a decrease in urine output.  Your urine turns a dark color or changes to pink, red, or brown. Any of the following symptoms occur over the next 10 days:  You have a temperature by mouth above 102 F (38.9 C), not controlled by medicine.  Shortness of breath.  Weakness after normal activity.  The white part of the eye turns yellow (jaundice).  You have a decrease in the amount of urine or are urinating less often.  Your urine turns a dark color or changes to pink, red, or brown. Document Released: 12/25/1999 Document Revised: 03/21/2011 Document Reviewed: 08/13/2007 ExitCare Patient Information 2014 [place].  _______________________________________________________________________  Incentive Spirometer  An incentive spirometer is a tool that can help keep your lungs clear and active. This tool measures how well you are filling your lungs with each breath. Taking long deep breaths may help reverse or decrease the chance of developing breathing (pulmonary) problems (especially infection) following:  A long  period of time when you are unable to move or be active. BEFORE THE PROCEDURE   If the spirometer includes an indicator to show your best effort, your nurse or respiratory therapist will set it to a desired goal.  If possible, sit up straight or lean slightly forward. Try not to slouch.  Hold the incentive spirometer in an upright position. INSTRUCTIONS FOR USE  3. Sit on the edge of your bed if possible, or sit up as far as you can in bed or on a chair. 4. Hold the incentive spirometer in an upright position. 5. Breathe out normally. 6. Place the mouthpiece in your mouth and seal your lips tightly around it. 7. Breathe in slowly and as deeply as possible, raising the piston or the ball toward the top of the column. 8. Hold your breath for 3-5 seconds or for as long as possible. Allow the piston or ball to fall to the bottom of the column. 9. Remove the mouthpiece from your mouth and breathe out normally. 10. Rest for a few seconds and repeat Steps 1 through 7 at least 10 times every 1-2 hours when you are awake. Take your time and take a few normal breaths between deep breaths. 11. The spirometer may include an indicator to show your best effort. Use the indicator as a goal to work toward during each repetition. 12. After each set of 10 deep breaths, practice coughing to be  sure your lungs are clear. If you have an incision (the cut made at the time of surgery), support your incision when coughing by placing a pillow or rolled up towels firmly against it. Once you are able to get out of bed, walk around indoors and cough well. You may stop using the incentive spirometer when instructed by your caregiver.  RISKS AND COMPLICATIONS  Take your time so you do not get dizzy or light-headed.  If you are in pain, you may need to take or ask for pain medication before doing incentive spirometry. It is harder to take a deep breath if you are having pain. AFTER USE  Rest and breathe slowly and  easily.  It can be helpful to keep track of a log of your progress. Your caregiver can provide you with a simple table to help with this. If you are using the spirometer at home, follow these instructions: [place] IF:   You are having difficultly using the spirometer.  You have trouble using the spirometer as often as instructed.  Your pain medication is not giving enough relief while using the spirometer.  You develop fever of 100.5 F (38.1 C) or higher. SEEK IMMEDIATE MEDICAL CARE IF:   You cough up bloody sputum that had not been present before.  You develop fever of 102 F (38.9 C) or greater.  You develop worsening pain at or near the incision site. MAKE SURE YOU:   Understand these instructions.  Will watch your condition.  Will get help right away if you are not doing well or get worse. Document Released: 05/09/2006 Document Revised: 03/21/2011 Document Reviewed: 07/10/2006 [facility] Patient Information 2014 [place], [place].   ________________________________________________________________________

## 2014-03-05 ENCOUNTER — Encounter (HOSPITAL_COMMUNITY): Payer: Self-pay

## 2014-03-05 ENCOUNTER — Encounter (HOSPITAL_COMMUNITY)
Admission: RE | Admit: 2014-03-05 | Discharge: 2014-03-05 | Disposition: A | Discharge status: Home / Self Care | Payer: BC Managed Care – PPO | Source: Ambulatory Visit | Attending: Orthopedic Surgery | Admitting: Orthopedic Surgery

## 2014-03-05 DIAGNOSIS — M1611 Unilateral primary osteoarthritis, right hip: Secondary | ICD-10-CM | POA: Insufficient documentation

## 2014-03-05 DIAGNOSIS — Z01818 Encounter for other preprocedural examination: Secondary | ICD-10-CM | POA: Insufficient documentation

## 2014-03-05 LAB — CBC
HCT: 42.2 % (ref 39.0–52.0)
HEMOGLOBIN: 14.1 g/dL (ref 13.0–17.0)
MCH: 31.1 pg (ref 26.0–34.0)
MCHC: 33.4 g/dL (ref 30.0–36.0)
MCV: 93 fL (ref 78.0–100.0)
Platelets: 188 10*3/uL (ref 150–400)
RBC: 4.54 MIL/uL (ref 4.22–5.81)
RDW: 12.9 % (ref 11.5–15.5)
WBC: 5.3 10*3/uL (ref 4.0–10.5)

## 2014-03-05 LAB — SURGICAL PCR SCREEN
MRSA, PCR: NEGATIVE
Staphylococcus aureus: NEGATIVE

## 2014-03-05 LAB — URINALYSIS, ROUTINE W REFLEX MICROSCOPIC
Bilirubin Urine: NEGATIVE
GLUCOSE, UA: NEGATIVE mg/dL
Hgb urine dipstick: NEGATIVE
KETONES UR: NEGATIVE mg/dL
Leukocytes, UA: NEGATIVE
NITRITE: NEGATIVE
PROTEIN: NEGATIVE mg/dL
Specific Gravity, Urine: 1.013 (ref 1.005–1.030)
UROBILINOGEN UA: 0.2 mg/dL (ref 0.0–1.0)
pH: 5 (ref 5.0–8.0)

## 2014-03-05 LAB — BASIC METABOLIC PANEL
Anion gap: 7 (ref 5–15)
BUN: 18 mg/dL (ref 6–23)
CALCIUM: 9.4 mg/dL (ref 8.4–10.5)
CO2: 28 mmol/L (ref 19–32)
Chloride: 103 mmol/L (ref 96–112)
Creatinine, Ser: 0.81 mg/dL (ref 0.50–1.35)
GFR calc non Af Amer: >90 mL/min (ref >90)
Glucose, Bld: 113 mg/dL — ABNORMAL HIGH (ref 70–99)
Potassium: 4.3 mmol/L (ref 3.5–5.1)
SODIUM: 138 mmol/L (ref 135–145)

## 2014-03-05 LAB — PROTIME-INR
INR: 1.01 (ref 0.00–1.49)
PROTHROMBIN TIME: 13.4 s (ref 11.6–15.2)

## 2014-03-05 LAB — APTT: aPTT: 33 seconds (ref 24–37)

## 2014-03-05 NOTE — Progress Notes (Signed)
Ancef ordered preop .  Allergy to Penicillins per patient ( unknown reaction ).  Please Advise.

## 2014-03-06 NOTE — Addendum Note (Signed)
Addended by: Vear Clock on: 03/06/2014 01:21 PM   Modules accepted: Orders

## 2014-03-06 NOTE — Progress Notes (Addendum)
EKG- 03/14/13 EPIC  ECHO- 09/2012 in EPIC  Ct Angio chest- 10/04/13 EPIC  appt with Dr Adora Fridge Pa 02/28/14 EPIC  Stress Test 02/28/14 EPIC

## 2014-03-07 NOTE — Telephone Encounter (Signed)
Left message

## 2014-03-07 NOTE — Telephone Encounter (Signed)
Returning your call. °

## 2014-03-07 NOTE — Telephone Encounter (Signed)
Pt called in regarding his cardiac clearance. Advised patient that Dr. Gwenlyn Found is out of the office and upon his return on 2/29 his clearance will be addressed. Advised patient that I would call him once the clearance has been faxed.

## 2014-03-07 NOTE — Telephone Encounter (Signed)
Spoke to [place]. Advised her that clearance will be faxed Monday.

## 2014-03-10 NOTE — Telephone Encounter (Signed)
Cleared for his total hip replacement at low cardiovascular risk

## 2014-03-10 NOTE — Anesthesia Preprocedure Evaluation (Addendum)
Anesthesia Evaluation  Patient identified by MRN, date of birth, ID band Patient awake    Reviewed: Allergy & Precautions, H&P , NPO status , Patient's Chart, lab work & pertinent test results, reviewed documented beta blocker date and time   Airway Mallampati: III  TM Distance: >3 FB Neck ROM: Full    Dental  (+) Teeth Intact, Dental Advisory Given   Pulmonary sleep apnea (Noncompliant with CPAP) ,  breath sounds clear to auscultation  Pulmonary exam normal       Cardiovascular hypertension, Pt. on medications + Valvular Problems/Murmurs Rhythm:Regular Rate:Normal  Dx. 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 Repeat CT 9/13 reveals aneurysm unchanged from prior evaluation.   Neuro/Psych negative neurological ROS  negative psych ROS   GI/Hepatic negative GI ROS, Neg liver ROS,   Endo/Other  Morbid obesity  Renal/GU negative Renal ROS  negative genitourinary   Musculoskeletal negative musculoskeletal ROS (+)   Abdominal   Peds  Hematology negative hematology ROS (+)   Anesthesia Other Findings   Reproductive/Obstetrics negative OB ROS                           Anesthesia Physical Anesthesia Plan  ASA: III  Anesthesia Plan: Spinal   Post-op Pain Management:    Induction: Intravenous  Airway Management Planned: Simple Face Mask  Additional Equipment:   Intra-op Plan:   Post-operative Plan:   Informed Consent: I have reviewed the patients History and Physical, chart, labs and discussed the procedure including the risks, benefits and alternatives for the proposed anesthesia with the patient or authorized representative who has indicated his/her understanding and acceptance.     Plan Discussed with: Surgeon, CRNA and Anesthesiologist  Anesthesia Plan Comments:       Anesthesia Quick Evaluation

## 2014-03-10 NOTE — Telephone Encounter (Signed)
Faxed clearance to [facility].   Notified pt

## 2014-03-10 NOTE — Telephone Encounter (Signed)
Need cardiac clearance-had lexiscan on 2/19-low risk

## 2014-03-11 ENCOUNTER — Inpatient Hospital Stay (HOSPITAL_COMMUNITY): Payer: BC Managed Care – PPO

## 2014-03-11 ENCOUNTER — Inpatient Hospital Stay (HOSPITAL_COMMUNITY)
Admission: RE | Admit: 2014-03-11 | Discharge: 2014-03-13 | DRG: 470 | Disposition: A | Discharge status: Home Health | Payer: BC Managed Care – PPO | Source: Ambulatory Visit | Attending: Orthopedic Surgery | Admitting: Orthopedic Surgery

## 2014-03-11 ENCOUNTER — Inpatient Hospital Stay (HOSPITAL_COMMUNITY): Payer: BC Managed Care – PPO | Admitting: Anesthesiology

## 2014-03-11 ENCOUNTER — Encounter (HOSPITAL_COMMUNITY): Payer: Self-pay | Admitting: *Deleted

## 2014-03-11 ENCOUNTER — Encounter (HOSPITAL_COMMUNITY)
Admission: RE | Disposition: A | Discharge status: Home Health | Payer: Self-pay | Source: Ambulatory Visit | Attending: Orthopedic Surgery

## 2014-03-11 DIAGNOSIS — M1611 Unilateral primary osteoarthritis, right hip: Principal | ICD-10-CM | POA: Diagnosis present

## 2014-03-11 DIAGNOSIS — Z96642 Presence of left artificial hip joint: Secondary | ICD-10-CM | POA: Diagnosis present

## 2014-03-11 DIAGNOSIS — E669 Obesity, unspecified: Secondary | ICD-10-CM | POA: Diagnosis present

## 2014-03-11 DIAGNOSIS — Z96649 Presence of unspecified artificial hip joint: Secondary | ICD-10-CM

## 2014-03-11 DIAGNOSIS — Z01812 Encounter for preprocedural laboratory examination: Secondary | ICD-10-CM | POA: Diagnosis not present

## 2014-03-11 DIAGNOSIS — G4733 Obstructive sleep apnea (adult) (pediatric): Secondary | ICD-10-CM | POA: Diagnosis present

## 2014-03-11 DIAGNOSIS — Z6836 Body mass index (BMI) 36.0-36.9, adult: Secondary | ICD-10-CM

## 2014-03-11 DIAGNOSIS — I10 Essential (primary) hypertension: Secondary | ICD-10-CM | POA: Diagnosis present

## 2014-03-11 DIAGNOSIS — E785 Hyperlipidemia, unspecified: Secondary | ICD-10-CM | POA: Diagnosis present

## 2014-03-11 DIAGNOSIS — M25551 Pain in right hip: Secondary | ICD-10-CM | POA: Diagnosis present

## 2014-03-11 HISTORY — PX: TOTAL HIP ARTHROPLASTY: SHX124

## 2014-03-11 LAB — TYPE AND SCREEN
ABO/RH(D): A POS
ANTIBODY SCREEN: NEGATIVE

## 2014-03-11 SURGERY — ARTHROPLASTY, HIP, TOTAL, ANTERIOR APPROACH
Anesthesia: Spinal | Site: Hip | Laterality: Right

## 2014-03-11 MED ORDER — SODIUM CHLORIDE 0.9 % IV SOLN
100.0000 mL/h | INTRAVENOUS | Status: DC
  Administered 2014-03-11 – 2014-03-12 (×2): 100 mL/h via INTRAVENOUS
  Filled 2014-03-11 (×4): qty 1000

## 2014-03-11 MED ORDER — DEXAMETHASONE SODIUM PHOSPHATE 10 MG/ML IJ SOLN
INTRAMUSCULAR | Status: AC
  Filled 2014-03-11: qty 1

## 2014-03-11 MED ORDER — FERROUS SULFATE 325 (65 FE) MG PO TABS
325.0000 mg | ORAL_TABLET | Freq: Three times a day (TID) | ORAL | Status: DC
  Administered 2014-03-11 – 2014-03-13 (×5): 325 mg via ORAL
  Filled 2014-03-11 (×10): qty 1

## 2014-03-11 MED ORDER — ONDANSETRON HCL 4 MG/2ML IJ SOLN
INTRAMUSCULAR | Status: DC | PRN
  Administered 2014-03-11: 4 mg via INTRAVENOUS

## 2014-03-11 MED ORDER — LIDOCAINE HCL (CARDIAC) 20 MG/ML IV SOLN
INTRAVENOUS | Status: DC | PRN
  Administered 2014-03-11: 50 mg via INTRAVENOUS

## 2014-03-11 MED ORDER — PROPOFOL 10 MG/ML IV BOLUS
INTRAVENOUS | Status: AC
  Filled 2014-03-11: qty 20

## 2014-03-11 MED ORDER — METHOCARBAMOL 500 MG PO TABS
500.0000 mg | ORAL_TABLET | Freq: Four times a day (QID) | ORAL | Status: DC | PRN
  Administered 2014-03-11 – 2014-03-13 (×4): 500 mg via ORAL
  Filled 2014-03-11 (×4): qty 1

## 2014-03-11 MED ORDER — MENTHOL 3 MG MT LOZG
1.0000 | LOZENGE | OROMUCOSAL | Status: DC | PRN
  Filled 2014-03-11: qty 9

## 2014-03-11 MED ORDER — FENTANYL CITRATE 0.05 MG/ML IJ SOLN
INTRAMUSCULAR | Status: AC
  Filled 2014-03-11: qty 2

## 2014-03-11 MED ORDER — ONDANSETRON HCL 4 MG/2ML IJ SOLN
INTRAMUSCULAR | Status: AC
  Filled 2014-03-11: qty 2

## 2014-03-11 MED ORDER — AMLODIPINE BESYLATE 5 MG PO TABS
5.0000 mg | ORAL_TABLET | Freq: Every morning | ORAL | Status: DC
Start: 2014-03-11 — End: 2014-03-13
  Administered 2014-03-12 – 2014-03-13 (×2): 5 mg via ORAL
  Filled 2014-03-11 (×3): qty 1

## 2014-03-11 MED ORDER — HYDROMORPHONE HCL 1 MG/ML IJ SOLN
0.5000 mg | INTRAMUSCULAR | Status: DC | PRN
  Administered 2014-03-11: 0.5 mg via INTRAVENOUS
  Filled 2014-03-11: qty 1

## 2014-03-11 MED ORDER — MIDAZOLAM HCL 5 MG/5ML IJ SOLN
INTRAMUSCULAR | Status: DC | PRN
  Administered 2014-03-11: 2 mg via INTRAVENOUS

## 2014-03-11 MED ORDER — METHOCARBAMOL 1000 MG/10ML IJ SOLN
500.0000 mg | Freq: Four times a day (QID) | INTRAVENOUS | Status: DC | PRN
  Administered 2014-03-11: 500 mg via INTRAVENOUS
  Filled 2014-03-11 (×2): qty 5

## 2014-03-11 MED ORDER — PHENOL 1.4 % MT LIQD
1.0000 | OROMUCOSAL | Status: DC | PRN
  Filled 2014-03-11: qty 177

## 2014-03-11 MED ORDER — HYDROMORPHONE HCL 1 MG/ML IJ SOLN
0.2500 mg | INTRAMUSCULAR | Status: DC | PRN
  Administered 2014-03-11: 0.25 mg via INTRAVENOUS

## 2014-03-11 MED ORDER — SODIUM CHLORIDE 0.9 % IR SOLN
Status: DC | PRN
  Administered 2014-03-11: 1000 mL

## 2014-03-11 MED ORDER — LACTATED RINGERS IV SOLN
INTRAVENOUS | Status: DC | PRN
  Administered 2014-03-11 (×3): via INTRAVENOUS

## 2014-03-11 MED ORDER — MAGNESIUM CITRATE PO SOLN: 1.0000 | Freq: Once | ORAL | Status: AC | PRN

## 2014-03-11 MED ORDER — PROPOFOL 10 MG/ML IV BOLUS
INTRAVENOUS | Status: DC | PRN
  Administered 2014-03-11 (×2): 20 mg via INTRAVENOUS

## 2014-03-11 MED ORDER — CEFAZOLIN SODIUM-DEXTROSE 2-3 GM-% IV SOLR
2.0000 g | Freq: Four times a day (QID) | INTRAVENOUS | Status: AC
  Administered 2014-03-11 (×2): 2 g via INTRAVENOUS
  Filled 2014-03-11 (×2): qty 50

## 2014-03-11 MED ORDER — ACETAMINOPHEN 10 MG/ML IV SOLN
1000.0000 mg | Freq: Once | INTRAVENOUS | Status: AC
  Administered 2014-03-11: 1000 mg via INTRAVENOUS
  Filled 2014-03-11: qty 100

## 2014-03-11 MED ORDER — ONDANSETRON HCL 4 MG/2ML IJ SOLN: 4.0000 mg | Freq: Four times a day (QID) | INTRAMUSCULAR | Status: DC | PRN

## 2014-03-11 MED ORDER — LACTATED RINGERS IV SOLN
INTRAVENOUS | Status: DC
  Administered 2014-03-11: 11:00:00 via INTRAVENOUS

## 2014-03-11 MED ORDER — ALUM & MAG HYDROXIDE-SIMETH 200-200-20 MG/5ML PO SUSP: 30.0000 mL | ORAL | Status: DC | PRN

## 2014-03-11 MED ORDER — HYDROMORPHONE HCL 1 MG/ML IJ SOLN: 0.2500 mg | INTRAMUSCULAR | Status: DC | PRN

## 2014-03-11 MED ORDER — DOCUSATE SODIUM 100 MG PO CAPS
100.0000 mg | ORAL_CAPSULE | Freq: Two times a day (BID) | ORAL | Status: DC
  Administered 2014-03-11 – 2014-03-13 (×5): 100 mg via ORAL

## 2014-03-11 MED ORDER — BUPIVACAINE HCL (PF) 0.5 % IJ SOLN
INTRAMUSCULAR | Status: DC | PRN
  Administered 2014-03-11: 3 mL

## 2014-03-11 MED ORDER — CELECOXIB 200 MG PO CAPS
200.0000 mg | ORAL_CAPSULE | Freq: Two times a day (BID) | ORAL | Status: DC
  Administered 2014-03-11 – 2014-03-13 (×5): 200 mg via ORAL
  Filled 2014-03-11 (×6): qty 1

## 2014-03-11 MED ORDER — TRANEXAMIC ACID 100 MG/ML IV SOLN
1000.0000 mg | Freq: Once | INTRAVENOUS | Status: AC
  Administered 2014-03-11: 1000 mg via INTRAVENOUS
  Filled 2014-03-11: qty 10

## 2014-03-11 MED ORDER — DEXAMETHASONE SODIUM PHOSPHATE 10 MG/ML IJ SOLN
10.0000 mg | Freq: Once | INTRAMUSCULAR | Status: AC
  Administered 2014-03-12: 10 mg via INTRAVENOUS
  Filled 2014-03-11: qty 1

## 2014-03-11 MED ORDER — HYDROMORPHONE HCL 1 MG/ML IJ SOLN
INTRAMUSCULAR | Status: AC
  Filled 2014-03-11: qty 1

## 2014-03-11 MED ORDER — CEFAZOLIN SODIUM 10 G IJ SOLR
3.0000 g | INTRAMUSCULAR | Status: AC
  Administered 2014-03-11: 3 g via INTRAVENOUS
  Filled 2014-03-11: qty 3000

## 2014-03-11 MED ORDER — ONDANSETRON HCL 4 MG/2ML IJ SOLN: 4.0000 mg | Freq: Once | INTRAMUSCULAR | Status: DC | PRN

## 2014-03-11 MED ORDER — BISACODYL 10 MG RE SUPP: 10.0000 mg | Freq: Every day | RECTAL | Status: DC | PRN

## 2014-03-11 MED ORDER — MIDAZOLAM HCL 2 MG/2ML IJ SOLN
INTRAMUSCULAR | Status: AC
  Filled 2014-03-11: qty 2

## 2014-03-11 MED ORDER — DIPHENHYDRAMINE HCL 25 MG PO CAPS
25.0000 mg | ORAL_CAPSULE | Freq: Four times a day (QID) | ORAL | Status: DC | PRN
  Administered 2014-03-12 (×2): 25 mg via ORAL
  Filled 2014-03-11 (×2): qty 1

## 2014-03-11 MED ORDER — CHLORHEXIDINE GLUCONATE 4 % EX LIQD: 60.0000 mL | Freq: Once | CUTANEOUS | Status: DC

## 2014-03-11 MED ORDER — HYDROCODONE-ACETAMINOPHEN 7.5-325 MG PO TABS
1.0000 | ORAL_TABLET | ORAL | Status: DC
  Administered 2014-03-11 (×2): 2 via ORAL
  Administered 2014-03-11: 1 via ORAL
  Administered 2014-03-12 – 2014-03-13 (×7): 2 via ORAL
  Filled 2014-03-11 (×10): qty 2

## 2014-03-11 MED ORDER — BUPIVACAINE HCL (PF) 0.5 % IJ SOLN
INTRAMUSCULAR | Status: AC
  Filled 2014-03-11: qty 30

## 2014-03-11 MED ORDER — LIDOCAINE HCL (CARDIAC) 20 MG/ML IV SOLN
INTRAVENOUS | Status: AC
  Filled 2014-03-11: qty 5

## 2014-03-11 MED ORDER — ATORVASTATIN CALCIUM 10 MG PO TABS
10.0000 mg | ORAL_TABLET | Freq: Every morning | ORAL | Status: DC
  Administered 2014-03-11 – 2014-03-13 (×3): 10 mg via ORAL
  Filled 2014-03-11 (×3): qty 1

## 2014-03-11 MED ORDER — FENTANYL CITRATE 0.05 MG/ML IJ SOLN
INTRAMUSCULAR | Status: DC | PRN
Start: 2014-03-11 — End: 2014-03-11
  Administered 2014-03-11: 100 ug via INTRAVENOUS

## 2014-03-11 MED ORDER — ASPIRIN EC 325 MG PO TBEC
325.0000 mg | DELAYED_RELEASE_TABLET | Freq: Two times a day (BID) | ORAL | Status: DC
  Administered 2014-03-12 – 2014-03-13 (×3): 325 mg via ORAL
  Filled 2014-03-11 (×5): qty 1

## 2014-03-11 MED ORDER — FUROSEMIDE 40 MG PO TABS
40.0000 mg | ORAL_TABLET | Freq: Every morning | ORAL | Status: DC
Start: 2014-03-11 — End: 2014-03-13
  Administered 2014-03-11 – 2014-03-13 (×3): 40 mg via ORAL
  Filled 2014-03-11 (×3): qty 1

## 2014-03-11 MED ORDER — PROPOFOL INFUSION 10 MG/ML OPTIME
INTRAVENOUS | Status: DC | PRN
  Administered 2014-03-11: 100 ug/kg/min via INTRAVENOUS

## 2014-03-11 MED ORDER — ONDANSETRON HCL 4 MG PO TABS: 4.0000 mg | ORAL_TABLET | Freq: Four times a day (QID) | ORAL | Status: DC | PRN

## 2014-03-11 MED ORDER — METOCLOPRAMIDE HCL 5 MG/ML IJ SOLN: 5.0000 mg | Freq: Three times a day (TID) | INTRAMUSCULAR | Status: DC | PRN

## 2014-03-11 MED ORDER — DEXAMETHASONE SODIUM PHOSPHATE 10 MG/ML IJ SOLN
10.0000 mg | Freq: Once | INTRAMUSCULAR | Status: AC
Start: 2014-03-11 — End: 2014-03-11
  Administered 2014-03-11: 10 mg via INTRAVENOUS

## 2014-03-11 MED ORDER — POLYETHYLENE GLYCOL 3350 17 G PO PACK
17.0000 g | PACK | Freq: Two times a day (BID) | ORAL | Status: DC
  Administered 2014-03-11 – 2014-03-13 (×4): 17 g via ORAL

## 2014-03-11 MED ORDER — METOCLOPRAMIDE HCL 10 MG PO TABS: 5.0000 mg | ORAL_TABLET | Freq: Three times a day (TID) | ORAL | Status: DC | PRN

## 2014-03-11 SURGICAL SUPPLY — 41 items
BAG DECANTER FOR FLEXI CONT (MISCELLANEOUS) ×3 IMPLANT
BAG ZIPLOCK 12X15 (MISCELLANEOUS) IMPLANT
CAPT HIP TOTAL 2 ×3 IMPLANT
COVER PERINEAL POST (MISCELLANEOUS) ×3 IMPLANT
DRAPE C-ARM 42X120 X-RAY (DRAPES) ×3 IMPLANT
DRAPE STERI IOBAN 125X83 (DRAPES) ×3 IMPLANT
DRAPE U-SHAPE 47X51 STRL (DRAPES) ×9 IMPLANT
DRSG AQUACEL AG ADV 3.5X10 (GAUZE/BANDAGES/DRESSINGS) ×3 IMPLANT
DURAPREP 26ML APPLICATOR (WOUND CARE) ×3 IMPLANT
ELECT BLADE TIP CTD 4 INCH (ELECTRODE) ×3 IMPLANT
ELECT PENCIL ROCKER SW 15FT (MISCELLANEOUS) IMPLANT
ELECT REM PT RETURN 15FT ADLT (MISCELLANEOUS) IMPLANT
ELECT REM PT RETURN 9FT ADLT (ELECTROSURGICAL) ×3
ELECTRODE REM PT RTRN 9FT ADLT (ELECTROSURGICAL) ×1 IMPLANT
FACESHIELD WRAPAROUND (MASK) ×12 IMPLANT
GLOVE BIOGEL PI IND STRL 7.5 (GLOVE) ×1 IMPLANT
GLOVE BIOGEL PI IND STRL 8.5 (GLOVE) ×1 IMPLANT
GLOVE BIOGEL PI INDICATOR 7.5 (GLOVE) ×2
GLOVE BIOGEL PI INDICATOR 8.5 (GLOVE) ×2
GLOVE ECLIPSE 8.0 STRL XLNG CF (GLOVE) ×6 IMPLANT
GLOVE ORTHO TXT STRL SZ7.5 (GLOVE) ×3 IMPLANT
GOWN SPEC L3 XXLG W/TWL (GOWN DISPOSABLE) ×3 IMPLANT
GOWN STRL REUS W/TWL LRG LVL3 (GOWN DISPOSABLE) ×3 IMPLANT
HOLDER FOLEY CATH W/STRAP (MISCELLANEOUS) ×3 IMPLANT
KIT BASIN OR (CUSTOM PROCEDURE TRAY) ×3 IMPLANT
LIQUID BAND (GAUZE/BANDAGES/DRESSINGS) ×3 IMPLANT
NDL SAFETY ECLIPSE 18X1.5 (NEEDLE) IMPLANT
NEEDLE HYPO 18GX1.5 SHARP (NEEDLE)
PACK TOTAL JOINT (CUSTOM PROCEDURE TRAY) ×3 IMPLANT
PEN SKIN MARKING BROAD (MISCELLANEOUS) ×3 IMPLANT
SAW OSC TIP CART 19.5X105X1.3 (SAW) ×3 IMPLANT
SUT MNCRL AB 4-0 PS2 18 (SUTURE) ×3 IMPLANT
SUT VIC AB 1 CT1 36 (SUTURE) ×9 IMPLANT
SUT VIC AB 2-0 CT1 27 (SUTURE) ×4
SUT VIC AB 2-0 CT1 TAPERPNT 27 (SUTURE) ×2 IMPLANT
SUT VLOC 180 0 24IN GS25 (SUTURE) ×3 IMPLANT
SYR 50ML LL SCALE MARK (SYRINGE) IMPLANT
TOWEL OR 17X26 10 PK STRL BLUE (TOWEL DISPOSABLE) ×3 IMPLANT
TOWEL OR NON WOVEN STRL DISP B (DISPOSABLE) IMPLANT
TRAY FOLEY CATH 14FRSI W/METER (CATHETERS) ×3 IMPLANT
WATER STERILE IRR 1500ML POUR (IV SOLUTION) ×3 IMPLANT

## 2014-03-11 NOTE — Anesthesia Postprocedure Evaluation (Signed)
  Anesthesia Post-op Note  Patient: Travis Myers  Procedure(s) Performed: Procedure(s): RIGHT TOTAL HIP ARTHROPLASTY ANTERIOR APPROACH (Right)  Patient Location: PACU  Anesthesia Type:Spinal  Level of Consciousness: awake, alert , oriented and patient cooperative  Airway and Oxygen Therapy: Patient Spontanous Breathing  Post-op Pain: none  Post-op Assessment: Post-op Vital signs reviewed, Patient's Cardiovascular Status Stable, Respiratory Function Stable, Patent Airway, No signs of Nausea or vomiting and Pain level controlled  Post-op Vital Signs: stable  Last Vitals:  Filed Vitals:   03/11/14 1115  BP: 150/92  Pulse: 57  Temp:   Resp: 11    Complications: No apparent anesthesia complications

## 2014-03-11 NOTE — Anesthesia Procedure Notes (Signed)
Spinal Patient location during procedure: OR End time: 03/11/2014 8:39 AM Staffing Resident/CRNA: Noralyn Pick Performed by: anesthesiologist and resident/CRNA  Preanesthetic Checklist Completed: patient identified, site marked, surgical consent, pre-op evaluation, timeout performed, IV checked, risks and benefits discussed and monitors and equipment checked Spinal Block Patient position: sitting Prep: Betadine Patient monitoring: heart rate, continuous pulse ox and blood pressure Approach: right paramedian Location: L2-3 Injection technique: single-shot Needle Needle type: Spinocan  Needle gauge: 22 G Needle length: 9 cm Additional Notes Expiration date of kit checked and confirmed. Patient tolerated procedure well, without complications.

## 2014-03-11 NOTE — Op Note (Addendum)
NAME:  Travis Myers NO.: [id number]      MEDICAL RECORD NO.: [id number]      FACILITY:  [facility]      PHYSICIAN:  Paralee Cancel D  DATE OF BIRTH:  [birth year]     DATE OF PROCEDURE:  03/11/2014                                 OPERATIVE REPORT         PREOPERATIVE DIAGNOSIS: Right  hip osteoarthritis.      POSTOPERATIVE DIAGNOSIS:  Right hip osteoarthritis.      PROCEDURE:  Right total hip replacement through an anterior approach   utilizing DePuy THR system, component size 41mm pinnacle cup, a size 36+4 neutral   Altrex liner, a size 8 Hi Tri Lock stem with a 36+1.5 delta ceramic   ball.      SURGEON:  Pietro Cassis. Alvan Dame, M.D.      ASSISTANT:  Nehemiah Massed, PA-C     ANESTHESIA:  Spinal.      SPECIMENS:  None.      COMPLICATIONS:  None.      BLOOD LOSS:  600 cc     DRAINS:  None.      INDICATION OF THE PROCEDURE:  Travis Myers is a 59 y.o. male who had   presented to office for evaluation of left hip pain.  Radiographs revealed   progressive degenerative changes with bone-on-bone   articulation to the  hip joint.  The patient had painful limited range of   motion significantly affecting their overall quality of life.  The patient was failing to    respond to conservative measures, and at this point was ready   to proceed with more definitive measures.  The patient has noted progressive   degenerative changes in his hip, progressive problems and dysfunction   with regarding the hip prior to surgery.  Consent was obtained for   benefit of pain relief.  Specific risk of infection, DVT, component   failure, dislocation, need for revision surgery, as well discussion of   the anterior versus posterior approach were reviewed.  Consent was   obtained for benefit of anterior pain relief through an anterior   approach.      PROCEDURE IN DETAIL:  The patient was brought to operative theater.   Once adequate anesthesia,  preoperative antibiotics, 2gm of Ancef, 1 gm of Tranexamic Acid, and 10mg  of Decadron administered.   The patient was positioned supine on the OSI [place]anna table.  Once adequate   padding of boney process was carried out, we had predraped out the hip, and  used fluoroscopy to confirm orientation of the pelvis and position.      The right hip was then prepped and draped from proximal iliac crest to   mid thigh with shower curtain technique.      Time-out was performed identifying the patient, planned procedure, and   extremity.     An incision was then made 2 cm distal and lateral to the   anterior superior iliac spine extending over the orientation of the   tensor fascia lata muscle and sharp dissection was carried down to the   fascia of the muscle and protractor placed in the soft tissues.      The fascia  was then incised.  The muscle belly was identified and swept   laterally and retractor placed along the superior neck.  Following   cauterization of the circumflex vessels and removing some pericapsular   fat, a second cobra retractor was placed on the inferior neck.  A third   retractor was placed on the anterior acetabulum after elevating the   anterior rectus.  A L-capsulotomy was along the line of the   superior neck to the trochanteric fossa, then extended proximally and   distally.  Tag sutures were placed and the retractors were then placed   intracapsular.  We then identified the trochanteric fossa and   orientation of my neck cut, confirmed this radiographically   and then made a neck osteotomy with the femur on traction.  The femoral   head was removed without difficulty or complication.  Traction was let   off and retractors were placed posterior and anterior around the   acetabulum.      The labrum and foveal tissue were debrided.  I began reaming with a 58mm   reamer and reamed up to 3mm reamer with good bony bed preparation and a 70mm   cup was chosen.  The final 48mm  Pinnacle cup was then impacted under fluoroscopy  to confirm the depth of penetration and orientation with respect to   abduction.  A screw was placed followed by the hole eliminator.  The final   36+4 neutral Altrex liner was impacted with good visualized rim fit.  The cup was positioned anatomically within the acetabular portion of the pelvis.      At this point, the femur was rolled at 80 degrees.  Further capsule was   released off the inferior aspect of the femoral neck.  I then   released the superior capsule proximally.  The hook was placed laterally   along the femur and elevated manually and held in position with the bed   hook.  The leg was then extended and adducted with the leg rolled to 100   degrees of external rotation.  Once the proximal femur was fully   exposed, I used a box osteotome to set orientation.  I then began   broaching with the starting chili pepper broach and passed this by hand and then broached up to 8.  With the 8 broach in place I chose a high offset neck and did a trial reduction.  The offset was appropriate, leg lengths   appeared to be equal, confirmed radiographically.   Given these findings, I went ahead and dislocated the hip, repositioned all   retractors and positioned the right hip in the extended and abducted position.  The final 8 Hi Tri Lock stem was   chosen and it was impacted down to the level of neck cut.  Based on this   and the trial reduction, a 36+1.5 delta ceramic ball was chosen and   impacted onto a clean and dry trunnion, and the hip was reduced.  The   hip had been irrigated throughout the case again at this point.  I did   reapproximate the superior capsular leaflet to the anterior leaflet   using #1 Vicryl.  The fascia of the   tensor fascia lata muscle was then reapproximated using #1 Vicryl and #0 V-lock sutures.  The   remaining wound was closed with 2-0 Vicryl and running 4-0 Monocryl.   The hip was cleaned, dried, and dressed  sterilely using Dermabond and  Aquacel dressing.  He was then brought   to recovery room in stable condition tolerating the procedure well.    Nehemiah Massed, PA-C was present for the entirety of the case involved from   preoperative positioning, perioperative retractor management, general   facilitation of the case, as well as primary wound closure as assistant.            Pietro Cassis Alvan Dame, M.D.        03/11/2014 11:38 AM

## 2014-03-11 NOTE — Transfer of Care (Signed)
Immediate Anesthesia Transfer of Care Note  Patient: Travis Myers  Procedure(s) Performed: Procedure(s): RIGHT TOTAL HIP ARTHROPLASTY ANTERIOR APPROACH (Right)  Patient Location: PACU  Anesthesia Type:Spinal  Level of Consciousness: awake, alert  and oriented  Airway & Oxygen Therapy: Patient Spontanous Breathing and Patient connected to face mask oxygen  Post-op Assessment: Report given to RN and Post -op Vital signs reviewed and stable  Post vital signs: Reviewed and stable  Last Vitals:  Filed Vitals:   03/11/14 0631  BP: 167/94  Pulse: 67  Temp: 36.9 C  Resp: 18    Complications: No apparent anesthesia complications

## 2014-03-11 NOTE — Interval H&P Note (Signed)
History and Physical Interval Note:  03/11/2014 6:48 AM  Travis Myers  has presented today for surgery, with the diagnosis of RIGHT HIP OA  The various methods of treatment have been discussed with the patient and family. After consideration of risks, benefits and other options for treatment, the patient has consented to  Procedure(s): RIGHT TOTAL HIP ARTHROPLASTY ANTERIOR APPROACH (Right) as a surgical intervention .  The patient's history has been reviewed, patient examined, no change in status, stable for surgery.  I have reviewed the patient's chart and labs.  Questions were answered to the patient's satisfaction.     [name]

## 2014-03-11 NOTE — Evaluation (Signed)
Physical Therapy Evaluation Patient Details Name: Travis Myers MRN: [id number] DOB: Aug 26, 1955 Today's Date: 03/11/2014   History of Present Illness  R DATHA  Clinical Impression  Patient ambulated x 45', very stiff in R thigh. Patient will benefit from PT to address problems listed in note below.    Follow Up Recommendations Home health PT;Supervision/Assistance - 24 hour    Equipment Recommendations  None recommended by PT    Recommendations for Other Services       Precautions / Restrictions Precautions Precautions: Fall      Mobility  Bed Mobility Overal bed mobility: Needs Assistance Bed Mobility: Supine to Sit     Supine to sit: Mod assist;HOB elevated     General bed mobility comments: assist R leg to edge of bed and for trunk into upright.   Transfers Overall transfer level: Needs assistance Equipment used: Rolling walker (2 wheeled) Transfers: Sit to/from Stand Sit to Stand: Min assist;From elevated surface         General transfer comment: cues for hand and R leg psition.  Ambulation/Gait Ambulation/Gait assistance: Min assist Ambulation Distance (Feet): 45 Feet Assistive device: Rolling walker (2 wheeled) Gait Pattern/deviations: Step-to pattern;Antalgic;Decreased step length - right;Decreased stance time - right     General Gait Details: cues for sequence and posture, decreased swing with r leg first.  Stairs            Wheelchair Mobility    Modified Rankin (Stroke Patients Only)       Balance                                             Pertinent Vitals/Pain Pain Assessment: 0-10 Pain Score: 5  Pain Descriptors / Indicators: Aching;Discomfort;Tightness Pain Intervention(s): Limited activity within patient's tolerance;Premedicated before session;Ice applied;Repositioned    Home Living Family/patient expects to be discharged to:: Private residence Living Arrangements: Spouse/significant  other Available Help at Discharge: Family Type of Home: House Home Access: Stairs to enter Entrance Stairs-Rails: Right Entrance Stairs-Number of Steps: 4 Home Layout: One level Home Equipment: Environmental consultant - 2 wheels      Prior Function Level of Independence: Independent               Hand Dominance        Extremity/Trunk Assessment               Lower Extremity Assessment: RLE deficits/detail RLE Deficits / Details: some difficulty flexing hip and advancing L leg       Communication   Communication: No difficulties  Cognition Arousal/Alertness: Awake/alert Behavior During Therapy: WFL for tasks assessed/performed Overall Cognitive Status: Within Functional Limits for tasks assessed                      General Comments      Exercises        Assessment/Plan    PT Assessment Patient needs continued PT services  PT Diagnosis Difficulty walking;Acute pain   PT Problem List Decreased strength;Decreased range of motion;Decreased activity tolerance;Decreased mobility;Decreased knowledge of precautions;Decreased safety awareness;Decreased knowledge of use of DME;Pain  PT Treatment Interventions DME instruction;Gait training;Stair training;Functional mobility training;Therapeutic activities;Therapeutic exercise;Patient/family education   PT Goals (Current goals can be found in the Care Plan section) Acute Rehab PT Goals Patient Stated Goal: to walk without pain. PT Goal Formulation: With patient Time For Goal  Achievement: 03/15/14 Potential to Achieve Goals: Good    Frequency 7X/week   Barriers to discharge        Co-evaluation               End of Session   Activity Tolerance: Patient tolerated treatment well Patient left: in chair;with call bell/phone within reach Nurse Communication: Mobility status         Time: 2162-4469 PT Time Calculation (min) (ACUTE ONLY): 24 min   Charges:   PT Evaluation $Initial PT Evaluation Tier I:  1 Procedure PT Treatments $Gait Training: 8-22 mins   PT G Codes:        Claretha Cooper 03/11/2014, 6:14 PM

## 2014-03-12 LAB — BASIC METABOLIC PANEL
ANION GAP: 8 (ref 5–15)
BUN: 18 mg/dL (ref 6–23)
CO2: 24 mmol/L (ref 19–32)
Calcium: 8.7 mg/dL (ref 8.4–10.5)
Chloride: 105 mmol/L (ref 96–112)
Creatinine, Ser: 0.93 mg/dL (ref 0.50–1.35)
GFR calc Af Amer: >90 mL/min (ref >90)
GFR, EST NON AFRICAN AMERICAN: 89 mL/min — AB (ref >90)
Glucose, Bld: 177 mg/dL — ABNORMAL HIGH (ref 70–99)
POTASSIUM: 4.1 mmol/L (ref 3.5–5.1)
SODIUM: 137 mmol/L (ref 135–145)

## 2014-03-12 LAB — CBC
HEMATOCRIT: 34.9 % — AB (ref 39.0–52.0)
HEMOGLOBIN: 11.8 g/dL — AB (ref 13.0–17.0)
MCH: 31 pg (ref 26.0–34.0)
MCHC: 33.8 g/dL (ref 30.0–36.0)
MCV: 91.6 fL (ref 78.0–100.0)
Platelets: 169 10*3/uL (ref 150–400)
RBC: 3.81 MIL/uL — ABNORMAL LOW (ref 4.22–5.81)
RDW: 13 % (ref 11.5–15.5)
WBC: 12.6 10*3/uL — ABNORMAL HIGH (ref 4.0–10.5)

## 2014-03-12 NOTE — Progress Notes (Signed)
Physical Therapy Treatment Patient Details Name: Travis Myers MRN: [id number] DOB: [birth year] Today's Date: 03/12/2014    History of Present Illness R DATHA    PT Comments    Patient reports that R thigh and hip feel much tighter, encouraged to  Attempt  Hip flexion, knee  Flexion in standing. Plans DC tomorrow.  Follow Up Recommendations  Home health PT;Supervision/Assistance - 24 hour     Equipment Recommendations  None recommended by PT    Recommendations for Other Services       Precautions / Restrictions Precautions Precautions: Fall    Mobility  Bed Mobility               General bed mobility comments: pt in chair  Transfers Overall transfer level: Needs assistance Equipment used: Rolling walker (2 wheeled) Transfers: Sit to/from Stand Sit to Stand: Min guard         General transfer comment: cues for hand and R leg psition. assist R leg from reclined  chair to floor, no control of leg to  let down to floor.  Ambulation/Gait Ambulation/Gait assistance: Min guard Ambulation Distance (Feet): 200 Feet Assistive device: Rolling walker (2 wheeled) Gait Pattern/deviations: Step-to pattern;Step-through pattern;Antalgic     General Gait Details: cues for sequence and posture, decreased swing with r leg first. reports that R thigh is much stiffer,   Stairs            Wheelchair Mobility    Modified Rankin (Stroke Patients Only)       Balance                                    Cognition Arousal/Alertness: Awake/alert Behavior During Therapy: WFL for tasks assessed/performed Overall Cognitive Status: Within Functional Limits for tasks assessed                      Exercises      General Comments        Pertinent Vitals/Pain Pain Score: 3  Pain Location: R thigh Pain Descriptors / Indicators: Aching;Discomfort;Sore Pain Intervention(s): Monitored during session;Premedicated before session;Ice  applied;Repositioned    Home Living Family/patient expects to be discharged to:: Private residence Living Arrangements: Spouse/significant other Available Help at Discharge: Family Type of Home: House Home Access: Stairs to enter Entrance Stairs-Rails: Right Home Layout: One level Home Equipment: Environmental consultant - 2 wheels      Prior Function Level of Independence: Independent          PT Goals (current goals can now be found in the care plan section) Acute Rehab PT Goals Patient Stated Goal: to walk without pain. Progress towards PT goals: Progressing toward goals    Frequency  7X/week    PT Plan Current plan remains appropriate    Co-evaluation             End of Session   Activity Tolerance: Patient tolerated treatment well Patient left: in chair;with call bell/phone within reach     Time: 1142-1210 PT Time Calculation (min) (ACUTE ONLY): 28 min  Charges:  $Gait Training: 23-37 mins                    G Codes:      Claretha Cooper 03/12/2014, 3:34 PM Tresa Endo PT [phone]

## 2014-03-12 NOTE — Progress Notes (Signed)
Physical Therapy Treatment Patient Details Name: Travis Myers MRN: [id number] DOB: [birth year] Today's Date: 03/12/2014    History of Present Illness R DATHA    PT Comments    Patient tolerated there ex, reports increased stiffness and swelling in inner  R thigh and hip. Encouraged patient to stay supine for awhile to stretch hip out. Practiced steps today.  Follow Up Recommendations  Home health PT;Supervision/Assistance - 24 hour     Equipment Recommendations  None recommended by PT    Recommendations for Other Services       Precautions / Restrictions Precautions Precautions: Fall    Mobility  Bed Mobility   Bed Mobility: Sit to Supine       Sit to supine: Min guard   General bed mobility comments: use of leg lifter and verbal cues for technique.  Transfers Overall transfer level: Needs assistance Equipment used: Rolling walker (2 wheeled) Transfers: Sit to/from Stand Sit to Stand: Min guard         General transfer comment: cues for hand and R leg psition. assist R leg from reclined  chair to floor, no control of leg to  let down to floor.  Ambulation/Gait Ambulation/Gait assistance: Min guard Ambulation Distance (Feet): 100 Feet Assistive device: Rolling walker (2 wheeled) Gait Pattern/deviations: Step-through pattern;Antalgic     General Gait Details: cues for sequence and posture, decreased swing with r leg first. reports that R thigh is much stiffer,   Stairs Stairs: Yes Stairs assistance: Min guard Stair Management: One rail Right;Backwards;Forwards;Step to pattern;Two rails Number of Stairs: 2 General stair comments: pt practiced  simulating  use of cane and a rail. difficulty  with placing R leg over the step, had turn sideways. patient then performed going down backward with rails, practiced at least 5 times on 2 steps.  Wheelchair Mobility    Modified Rankin (Stroke Patients Only)       Balance                                     Cognition Arousal/Alertness: Awake/alert Behavior During Therapy: WFL for tasks assessed/performed Overall Cognitive Status: Within Functional Limits for tasks assessed                      Exercises Total Joint Exercises Ankle Circles/Pumps: AROM;Both;Supine;10 reps Quad Sets: AROM;Both;Supine;10 reps Short Arc Quad: AROM;Right;Supine;10 reps Heel Slides: AAROM;Right;Supine;10 reps Hip ABduction/ADduction: AAROM;Right;10 reps Straight Leg Raises: Supine Long Arc Quad: AROM;Right;Seated;10 reps Other Exercises Other Exercises: seated self assisted hip flexion with sheet around the foot     General Comments        Pertinent Vitals/Pain Pain Score: 5  Pain Location: R thigh Pain Descriptors / Indicators: Tightness;Discomfort;Sore Pain Intervention(s): Monitored during session;Patient requesting pain meds-RN notified;Ice applied;Repositioned    Home Living Family/patient expects to be discharged to:: Private residence Living Arrangements: Spouse/significant other Available Help at Discharge: Family Type of Home: House Home Access: Stairs to enter Entrance Stairs-Rails: Right Home Layout: One level Home Equipment: Environmental consultant - 2 wheels      Prior Function Level of Independence: Independent          PT Goals (current goals can now be found in the care plan section) Acute Rehab PT Goals Patient Stated Goal: to walk without pain. Progress towards PT goals: Progressing toward goals    Frequency  7X/week    PT Plan Current  plan remains appropriate    Co-evaluation             End of Session   Activity Tolerance: Patient tolerated treatment well Patient left: in bed;with call bell/phone within reach     Time: 9842-1031 PT Time Calculation (min) (ACUTE ONLY): 36 min  Charges:  $Gait Training: 8-22 mins $Therapeutic Exercise: 8-22 mins                    G Codes:      Claretha Cooper 03/12/2014, 3:42 PM Tresa Endo  PT [phone]

## 2014-03-12 NOTE — Evaluation (Signed)
Occupational Therapy Evaluation Patient Details Name: Travis Myers MRN: [id number] DOB: April 10, 1955 Today's Date: 03/12/2014    History of Present Illness R DATHA   Clinical Impression   Pt is s/p THA resulting in the deficits listed below (see OT Problem List).  Pt will benefit from skilled OT to increase their safety and independence with ADL and functional mobility for ADL to facilitate discharge to venue listed below.        Follow Up Recommendations  No OT follow up    Equipment Recommendations  None recommended by OT    Recommendations for Other Services       Precautions / Restrictions Precautions Precautions: Fall      Mobility Bed Mobility               General bed mobility comments: pt in chair  Transfers Overall transfer level: Needs assistance Equipment used: 1 person hand held assist Transfers: Sit to/from Stand Sit to Stand: Min guard         General transfer comment: cues for hand and R leg psition.    Balance                                            ADL Overall ADL's : Needs assistance/impaired     Grooming: Set up;Sitting           Upper Body Dressing : Set up;Sitting   Lower Body Dressing: Moderate assistance;Sit to/from stand   Toilet Transfer: Minimal assistance;Comfort height toilet;RW   Toileting- Clothing Manipulation and Hygiene: Moderate assistance;Sit to/from stand         General ADL Comments: will benefit from AE.                 Pertinent Vitals/Pain Pain Score: 4  Pain Location: R hip Pain Descriptors / Indicators: Sore     Hand Dominance     Extremity/Trunk Assessment Upper Extremity Assessment Upper Extremity Assessment: Overall WFL for tasks assessed           Communication Communication Communication: No difficulties   Cognition Arousal/Alertness: Awake/alert Behavior During Therapy: WFL for tasks assessed/performed Overall Cognitive Status: Within  Functional Limits for tasks assessed                     General Comments       Exercises       Shoulder Instructions      Home Living Family/patient expects to be discharged to:: Private residence Living Arrangements: Spouse/significant other Available Help at Discharge: Family Type of Home: House Home Access: Stairs to enter Technical brewer of Steps: 4 Entrance Stairs-Rails: Right Home Layout: One level     Bathroom Shower/Tub: Tub/shower unit Shower/tub characteristics: Door       Home Equipment: Environmental consultant - 2 wheels          Prior Functioning/Environment Level of Independence: Independent             OT Diagnosis: Generalized weakness   OT Problem List: Decreased strength   OT Treatment/Interventions: Self-care/ADL training;DME and/or AE instruction;Patient/family education    OT Goals(Current goals can be found in the care plan section) Acute Rehab OT Goals Patient Stated Goal: to walk without pain. OT Goal Formulation: With patient Time For Goal Achievement: 03/26/14  OT Frequency: Min 2X/week   Barriers to D/C:  Co-evaluation              End of Session Equipment Utilized During Treatment: Rolling walker Nurse Communication: Mobility status  Activity Tolerance: Patient tolerated treatment well Patient left: in chair;with call bell/phone within reach   Time: 1240-1252 OT Time Calculation (min): 12 min Charges:  OT General Charges $OT Visit: 1 Procedure G-Codes:    Payton Mccallum D 04/06/14, 1:04 PM

## 2014-03-12 NOTE — Care Management Note (Signed)
    Page 1 of 1   03/12/2014     1:14:48 PM CARE MANAGEMENT NOTE 03/12/2014  Patient:  Travis Myers, Travis Myers   Account Number:  [id number]  Date Initiated:  03/12/2014  Documentation initiated by:  [facility]  Subjective/Objective Assessment:   adm: RIGHT TOTAL HIP ARTHROPLASTY ANTERIOR APPROACH (Right)     Action/Plan:   discharge planning   Anticipated DC Date:  03/12/2014   Anticipated DC Plan:  [place]  CM consult      [facility] Choice  HOME HEALTH   Choice offered to / List presented to:  C-1 Patient        [place] arranged  HH-2 PT      [place]   Status of service:  Completed, signed off Medicare Important Message given?   (If response is "NO", the following Medicare IM given date fields will be blank) Date Medicare IM given:   Medicare IM given by:   Date Additional Medicare IM given:   Additional Medicare IM given by:    Discharge Disposition:  [place]  Per UR Regulation:  Reviewed for med. necessity/level of care/duration of stay  If discussed at [place] of Stay Meetings, dates discussed:    Comments:  03/12/14 10:30 CM met with pt in room to offer choice of home health agency.  Pt chooses [facility] to render HHPT.  Address and contact information verified with pt.  Pt has both a 3n1 and rolling walker at home.  Referral emailed to [employer], Tim.  No other Cm needs were communicated. Mariane Masters, BSn, CM [phone].

## 2014-03-12 NOTE — Progress Notes (Signed)
     Subjective: 1 Day Post-Op Procedure(s) (LRB): RIGHT TOTAL HIP ARTHROPLASTY ANTERIOR APPROACH (Right)   Patient reports pain as moderate, pain controlled. No events throughout the night. Ready to work with PT, but still having difficulty with moving the   Objective:   VITALS:   Filed Vitals:   03/12/14  BP: 148/81  Pulse: 63  Temp: 98.2 F (36.8 C)   Resp: 20    Dorsiflexion/Plantar flexion intact Incision: dressing C/D/I No cellulitis present Compartment soft  LABS  Recent Labs  03/12/14 0535  HGB 11.8*  HCT 34.9*  WBC 12.6*  PLT 169     Recent Labs  03/12/14 0535  NA 137  K 4.1  BUN 18  CREATININE 0.93  GLUCOSE 177*     Assessment/Plan: 1 Day Post-Op Procedure(s) (LRB): RIGHT TOTAL HIP ARTHROPLASTY ANTERIOR APPROACH (Right) Foley cath d/c'ed Advance diet Up with therapy D/C IV fluids Discharge home with home health eventually when ready     [place] Pugh. Nikayla Madaris   PAC  03/12/2014, 9:25 AM

## 2014-03-13 DIAGNOSIS — E669 Obesity, unspecified: Secondary | ICD-10-CM | POA: Diagnosis present

## 2014-03-13 LAB — CBC
HCT: 33.7 % — ABNORMAL LOW (ref 39.0–52.0)
HEMOGLOBIN: 11.4 g/dL — AB (ref 13.0–17.0)
MCH: 31.5 pg (ref 26.0–34.0)
MCHC: 33.8 g/dL (ref 30.0–36.0)
MCV: 93.1 fL (ref 78.0–100.0)
Platelets: 157 10*3/uL (ref 150–400)
RBC: 3.62 MIL/uL — ABNORMAL LOW (ref 4.22–5.81)
RDW: 13.2 % (ref 11.5–15.5)
WBC: 14.7 10*3/uL — AB (ref 4.0–10.5)

## 2014-03-13 LAB — BASIC METABOLIC PANEL
Anion gap: 6 (ref 5–15)
BUN: 21 mg/dL (ref 6–23)
CO2: 27 mmol/L (ref 19–32)
Calcium: 8.4 mg/dL (ref 8.4–10.5)
Chloride: 103 mmol/L (ref 96–112)
Creatinine, Ser: 0.83 mg/dL (ref 0.50–1.35)
GFR calc Af Amer: >90 mL/min (ref >90)
GFR calc non Af Amer: >90 mL/min (ref >90)
GLUCOSE: 149 mg/dL — AB (ref 70–99)
POTASSIUM: 4 mmol/L (ref 3.5–5.1)
Sodium: 136 mmol/L (ref 135–145)

## 2014-03-13 MED ORDER — METHOCARBAMOL 500 MG PO TABS: 500.0000 mg | ORAL_TABLET | Freq: Four times a day (QID) | ORAL | Status: DC | PRN

## 2014-03-13 MED ORDER — ASPIRIN 325 MG PO TBEC: 325.0000 mg | DELAYED_RELEASE_TABLET | Freq: Two times a day (BID) | ORAL | Status: AC

## 2014-03-13 MED ORDER — HYDROCODONE-ACETAMINOPHEN 7.5-325 MG PO TABS: 1.0000 | ORAL_TABLET | ORAL | Status: DC | PRN

## 2014-03-13 MED ORDER — FERROUS SULFATE 325 (65 FE) MG PO TABS
325.0000 mg | ORAL_TABLET | Freq: Three times a day (TID) | ORAL | Status: DC
Start: 2014-03-13 — End: 2014-07-01

## 2014-03-13 MED ORDER — DOCUSATE SODIUM 100 MG PO CAPS: 100.0000 mg | ORAL_CAPSULE | Freq: Two times a day (BID) | ORAL | Status: DC

## 2014-03-13 MED ORDER — POLYETHYLENE GLYCOL 3350 17 G PO PACK: 17.0000 g | PACK | Freq: Two times a day (BID) | ORAL | Status: DC

## 2014-03-13 NOTE — Progress Notes (Signed)
     Subjective: 2 Days Post-Op Procedure(s) (LRB): RIGHT TOTAL HIP ARTHROPLASTY ANTERIOR APPROACH (Right)   Patient reports pain as mild, pain controlled. A little more sore after PT, but doing well.  Confident that he will do well at home. Ready to be discharged home after PT.  Objective:   VITALS:   Filed Vitals:   03/13/14 0503  BP: 138/67  Pulse: 69  Temp: 97.8 F (36.6 C)  Resp: 18    Dorsiflexion/Plantar flexion intact Incision: dressing C/D/I No cellulitis present Compartment soft  LABS  Recent Labs  03/12/14 0535 03/13/14 0459  HGB 11.8* 11.4*  HCT 34.9* 33.7*  WBC 12.6* 14.7*  PLT 169 157     Recent Labs  03/12/14 0535 03/13/14 0459  NA 137 136  K 4.1 4.0  BUN 18 21  CREATININE 0.93 0.83  GLUCOSE 177* 149*     Assessment/Plan: 2 Days Post-Op Procedure(s) (LRB): RIGHT TOTAL HIP ARTHROPLASTY ANTERIOR APPROACH (Right) Up with therapy Discharge home with home health  Follow up in 2 weeks at [facility]. Follow up with OLIN,Vivan Vanderveer D in 2 weeks.  Contact information:  [facility] [address] [place] [phone]    Obese (BMI 30-39.9) Estimated body mass index is 36.9 kg/(m^2) as calculated from the following:   Height as of this encounter: 6\' 4"  (1.93 m).   Weight as of this encounter: 137.44 kg (303 lb). Patient also counseled that weight may inhibit the healing process Patient counseled that losing weight will help with future health issues       West Pugh. Mayci Haning   PAC  03/13/2014, 8:55 AM

## 2014-03-13 NOTE — Progress Notes (Signed)
Physical Therapy Treatment Patient Details Name: CANDLER GINSBERG MRN: [id number] DOB: October 06, 1955 Today's Date: 2014-03-21    History of Present Illness R DATHA    PT Comments    Progressing well;   Follow Up Recommendations  Home health PT;Supervision/Assistance - 24 hour     Equipment Recommendations  None recommended by PT    Recommendations for Other Services       Precautions / Restrictions Precautions Precautions: Fall Precaution Comments: PWB RLE Restrictions Weight Bearing Restrictions: Yes RLE Weight Bearing: Partial weight bearing Other Position/Activity Restrictions: PWB    Mobility  Bed Mobility                  Transfers Overall transfer level: Needs assistance Equipment used: Rolling walker (2 wheeled) Transfers: Sit to/from Stand Sit to Stand: Supervision;Modified independent (Device/Increase time)         General transfer comment: cues for LE position  Ambulation/Gait Ambulation/Gait assistance: Supervision Ambulation Distance (Feet): 180 Feet Assistive device: Rolling walker (2 wheeled) Gait Pattern/deviations: Step-to pattern;Antalgic     General Gait Details: cues for sequence, PWB   Stairs Stairs: Yes Stairs assistance: Min guard Stair Management: One rail Right;Forwards;Backwards;With cane;Step to pattern Number of Stairs: 5 (x2) General stair comments: cues fro sequence and PWB  Wheelchair Mobility    Modified Rankin (Stroke Patients Only)       Balance                                    Cognition Arousal/Alertness: Awake/alert Behavior During Therapy: WFL for tasks assessed/performed Overall Cognitive Status: Within Functional Limits for tasks assessed                      Exercises Other Exercises Other Exercises: pt doing exercises on his own, utilizing leg lifter where needed    General Comments        Pertinent Vitals/Pain Pain Score: 4  Pain Location: R thigh Pain  Descriptors / Indicators: Sore Pain Intervention(s): Limited activity within patient's tolerance;Monitored during session;Repositioned    Home Living                      Prior Function            PT Goals (current goals can now be found in the care plan section) Acute Rehab PT Goals PT Goal Formulation: With patient Time For Goal Achievement: 03/15/14 Potential to Achieve Goals: Good Progress towards PT goals: Progressing toward goals    Frequency  7X/week    PT Plan Current plan remains appropriate    Co-evaluation             End of Session   Activity Tolerance: Patient tolerated treatment well Patient left: in chair;with call bell/phone within reach     Time: 6295-2841 PT Time Calculation (min) (ACUTE ONLY): 26 min  Charges:  $Gait Training: 23-37 mins                    G Codes:      Jackquline Branca 03-21-2014, 9:59 AM

## 2014-03-13 NOTE — Progress Notes (Signed)
Occupational Therapy Treatment Patient Details Name: Travis Myers MRN: [id number] DOB: [birth year] Today's Date: 03/13/2014    History of present illness R DATHA   OT comments  Completed education and pt feels verbalizes understanding of all.  No further OT is needed at this time  Follow Up Recommendations  No OT follow up    Equipment Recommendations  None recommended by OT (has 3:1)    Recommendations for Other Services      Precautions / Restrictions Precautions Precautions: Fall Precaution Comments: PWB RLE Restrictions Weight Bearing Restrictions: Yes Other Position/Activity Restrictions: PWB       Mobility Bed Mobility                  Transfers   Equipment used: Rolling walker (2 wheeled) Transfers: Sit to/from Stand Sit to Stand: Supervision         General transfer comment: cues for LE placement    Balance                                   ADL       Grooming: Oral care;Supervision/safety;Standing                   Toilet Transfer: Min guard;Ambulation (simulated, chair)             General ADL Comments: ambulated to bathroom with min guard.  Pt is PWB and tall walker is a little narrow for him.  Educated on sidestepping into tub (spoke to [place], [place], [place] to do this once a day).  Educated on AE.  Pt has a Secondary school teacher and will get a long netted sponge on a stick      Vision                     Perception     Praxis      Cognition   Behavior During Therapy: WFL for tasks assessed/performed Overall Cognitive Status: Within Functional Limits for tasks assessed                       Extremity/Trunk Assessment               Exercises     Shoulder Instructions       General Comments      Pertinent Vitals/ Pain       Pain Score: 1  Pain Location: R thigh Pain Descriptors / Indicators: Sore Pain Intervention(s): Limited activity within patient's tolerance;Monitored during  session;Premedicated before session;Repositioned;Ice applied  Home Living                                          Prior Functioning/Environment              Frequency       Progress Toward Goals  OT Goals(current goals can now be found in the care plan section)  Progress towards OT goals:  (goals set and met today)  Acute Rehab OT Goals OT Goal Formulation: With patient Time For Goal Achievement: 03/14/14 ADL Goals Additional ADL Goal #1: pt will complete grooming and sit to stand at supervision level for ADLs as well as verbalize understanding of AE  Plan Discharge plan remains appropriate    Co-evaluation  End of Session     Activity Tolerance     Patient Left     Nurse Communication          Time: 0802-2336 OT Time Calculation (min): 28 min RN and PA came in during this session  Charges: OT General Charges $OT Visit: 1 Procedure OT Treatments $Self Care/Home Management : 8-22 mins  Tate Zagal 03/13/2014, 8:51 AM  Lesle Chris, OTR/L [phone] 03/13/2014

## 2014-03-18 NOTE — Discharge Summary (Signed)
Physician Discharge Summary  Patient ID: Travis Myers MRN: [id number] DOB/AGE: 61-30-1957 61 y.o.  Admit date: 03/11/2014 Discharge date: 03/13/2014   Procedures:  Procedure(s) (LRB): RIGHT TOTAL HIP ARTHROPLASTY ANTERIOR APPROACH (Right)  Attending Physician:  Dr. Paralee Cancel   Admission Diagnoses:   Right hip primary OA / pain  Discharge Diagnoses:  Principal Problem:   S/P right THA, AA Active Problems:   Obese  Past Medical History  Diagnosis Date  . Hypertension   . Hyperlipidemia   . OA (osteoarthritis) of knee left  . Acute meniscal tear of knee left  . OSA (obstructive sleep apnea) NON-TOLERANT CPAP  . Heart murmur MILD-- ASYMPTOMATIC  . Tinnitus of both ears   . Aneurysm, ascending aorta 4.8cm per ct 2011/ CARDIOLOGSIT--  DR Rollene Fare  LOV  10-07-2011  NOTE W/ CHART AND REQUEST LEXISCAN MYOVIEW AND ECHO TO BE FAXED.    ASYMPTOMATIC    HPI:    Travis Myers, 59 y.o. male, has a history of pain and functional disability in the right hip(s) due to arthritis and patient has failed non-surgical conservative treatments for greater than 12 weeks to include NSAID's and/or analgesics and activity modification. Onset of symptoms was gradual starting 1.5+ years ago with gradually worsening course since that time.The patient noted prior procedures of the hip to include arthroplasty on the left hip about 5 years ago. Patient currently rates pain in the right hip at 9 out of 10 with activity. Patient has night pain, worsening of pain with activity and weight bearing, trendelenberg gait, pain that interfers with activities of daily living and pain with passive range of motion. Patient has evidence of periarticular osteophytes and joint space narrowing by imaging studies. This condition presents safety issues increasing the risk of falls. There is no current active infection. Risks, benefits and expectations were discussed with the patient. Risks including but  not limited to the risk of anesthesia, blood clots, nerve damage, blood vessel damage, failure of the prosthesis, infection and up to and including death. Patient understand the risks, benefits and expectations and wishes to proceed with surgery.  PCP: [name], MD   Discharged Condition: good  Hospital Course:  Patient underwent the above stated procedure on 03/11/2014. Patient tolerated the procedure well and brought to the recovery room in good condition and subsequently to the floor.  POD #1 BP: 148/81 ; Pulse: 63 ; Temp: 98.2 F (36.8 C) ; Resp: 20 Patient reports pain as moderate, pain controlled. No events throughout the night. Ready to work with PT, but still having difficulty with moving the right leg, Dorsiflexion/plantar flexion intact, incision: dressing C/D/I, no cellulitis present and compartment soft.   LABS  Basename    HGB  11.8  HCT  34.9   POD #2  BP: 138/67 ; Pulse: 69 ; Temp: 97.8 F (36.6 C) ; Resp: 18 Patient reports pain as mild, pain controlled. A little more sore after PT, but doing well. Confident that he will do well at home. Ready to be discharged home after PT. Dorsiflexion/plantar flexion intact, incision: dressing C/D/I, no cellulitis present and compartment soft.   LABS  Basename    HGB  11.4  HCT  33.7    Discharge Exam: General appearance: alert, cooperative and no distress Extremities: Homans sign is negative, no sign of DVT, no edema, redness or tenderness in the calves or thighs and no ulcers, gangrene or trophic changes  Disposition: Home with follow up in 2 weeks  Follow-up Information    Follow up with [facility].   Why:  home health physical therapy   Contact information:   [address] [place] [address] [phone]       Follow up with [name], MD. Schedule an appointment as soon as possible for a visit in 2 weeks.   Specialty:  Orthopedic Surgery   Contact information:   [address] [place] [address] [phone]       Discharge Instructions    Call MD / Call 911    Complete by:  As directed   If you experience chest pain or shortness of breath, CALL 911 and be transported to the hospital emergency room.  If you develope a fever above 101 F, pus (white drainage) or increased drainage or redness at the wound, or calf pain, call your surgeon's office.     Change dressing    Complete by:  As directed   Maintain surgical dressing until follow up in the clinic. If the edges start to pull up, may reinforce with tape. If the dressing is no longer working, may remove and cover with gauze and tape, but must keep the area dry and clean.  Call with any questions or concerns.     Constipation Prevention    Complete by:  As directed   Drink plenty of fluids.  Prune juice may be helpful.  You may use a stool softener, such as Colace (over the counter) 100 mg twice a day.  Use MiraLax (over the counter) for constipation as needed.     Diet - low sodium heart healthy    Complete by:  As directed      Discharge instructions    Complete by:  As directed   Maintain surgical dressing until follow up in the clinic. If the edges start to pull up, may reinforce with tape. If the dressing is no longer working, may remove and cover with gauze and tape, but must keep the area dry and clean.  Follow up in 2 weeks at [facility]. Call with any questions or concerns.     Driving restrictions    Complete by:  As directed   No driving for 4 weeks     Partial weight bearing    Complete by:  As directed   % Body Weight:  50  Laterality:  right  Extremity:  Lower     TED hose    Complete by:  As directed   Use stockings (TED hose) for 2 weeks on both leg(s).  You may remove them at night for sleeping.             Medication List    STOP taking these medications        HYDROcodone-acetaminophen 5-325 MG per tablet  Commonly known as:   NORCO/VICODIN  Replaced by:  HYDROcodone-acetaminophen 7.5-325 MG per tablet     ibuprofen 200 MG tablet  Commonly known as:  ADVIL,MOTRIN     traMADol 50 MG tablet  Commonly known as:  ULTRAM      TAKE these medications        amLODipine 5 MG tablet  Commonly known as:  NORVASC  Take 5 mg by mouth every morning.     aspirin 325 MG EC tablet  Take 1 tablet (325 mg total) by mouth 2 (two) times daily.     atorvastatin 10 MG tablet  Commonly known as:  LIPITOR  Take  10 mg by mouth every morning.     docusate sodium 100 MG capsule  Commonly known as:  COLACE  Take 1 capsule (100 mg total) by mouth 2 (two) times daily.     ferrous sulfate 325 (65 FE) MG tablet  Take 1 tablet (325 mg total) by mouth 3 (three) times daily after meals.     furosemide 40 MG tablet  Commonly known as:  LASIX  Take 40 mg by mouth every morning.     HYDROcodone-acetaminophen 7.5-325 MG per tablet  Commonly known as:  NORCO  Take 1-2 tablets by mouth every 4 (four) hours as needed for moderate pain.     methocarbamol 500 MG tablet  Commonly known as:  ROBAXIN  Take 1 tablet (500 mg total) by mouth every 6 (six) hours as needed for muscle spasms.     polyethylene glycol packet  Commonly known as:  MIRALAX / GLYCOLAX  Take 17 g by mouth 2 (two) times daily.         Signed: [place] Pugh. Jearld Hemp   PA-C  03/18/2014, 4:15 PM

## 2014-07-01 ENCOUNTER — Encounter: Payer: Self-pay | Admitting: Cardiovascular Disease

## 2014-07-01 ENCOUNTER — Ambulatory Visit (INDEPENDENT_AMBULATORY_CARE_PROVIDER_SITE_OTHER): Payer: BC Managed Care – PPO | Admitting: Cardiovascular Disease

## 2014-07-01 VITALS — BP 138/80 | HR 72 | Ht 77.0 in | Wt 310.0 lb

## 2014-07-01 DIAGNOSIS — I712 Thoracic aortic aneurysm, without rupture, unspecified: Secondary | ICD-10-CM

## 2014-07-01 DIAGNOSIS — I10 Essential (primary) hypertension: Secondary | ICD-10-CM

## 2014-07-01 DIAGNOSIS — E785 Hyperlipidemia, unspecified: Secondary | ICD-10-CM

## 2014-07-01 NOTE — Assessment & Plan Note (Signed)
History of descending thoracic aortic aortic aneurysm measuring a +4.8 cm about annually by CT angiogram. This apparently was just recently checked in September 2015. We will obtain a copy of this. He should be on annual CT angiogram surveillance.

## 2014-07-01 NOTE — Assessment & Plan Note (Signed)
History of hyperlipidemia or atorvastatin 10 mg a day followed by his PCP

## 2014-07-01 NOTE — Progress Notes (Signed)
07/01/2014 Travis Myers   14-May-1955  [id number]  Primary Physician [name], MD Primary Cardiologist: [name] MD Renae Gloss   HPI:  Travis Myers is a 59 year old moderately overweight married Caucasian male with no children who works in the Transport planner at [employer]. He was previously a patient of Dr. Georgiann Mccoy His primary care provider is Cyndi Bender he PA-C at [facility]. He has a history of treated hypertension and hyperlipidemia. There is no family history. He has never had a heart attack or stroke. He denies chest pain or shortness of breath. He does have a thoracic aortic aneurysm measuring 51 mm by CT angiogram performed in September last year. This is followed on an annual basis. He had a negative Myoview stress test performed in February of this year done for preoperative clearance before a right total hip replacement done via the anterior approach by Dr. Alvan Dame . He is recuperated from this nicely. He denies chest pain or shortness of breath.   Current Outpatient Prescriptions  Medication Sig Dispense Refill  . aspirin 81 MG tablet Take 81 mg by mouth daily.    Marland Kitchen atorvastatin (LIPITOR) 10 MG tablet Take 10 mg by mouth every morning.     . furosemide (LASIX) 40 MG tablet Take 40 mg by mouth every morning.     Marland Kitchen HYDROcodone-acetaminophen (NORCO) 7.5-325 MG per tablet Take 1-2 tablets by mouth every 4 (four) hours as needed for moderate pain. 100 tablet 0  . lisinopril (PRINIVIL,ZESTRIL) 40 MG tablet TAKE 1 TABLET BY MOUTH EVERY DAY FOR BLOOD PRESSURE  5  . silver sulfADIAZINE (SILVADENE) 1 % cream APPLY TWICE DAILY TO AFFECTED AREA AFTER CLEANING AND DRYING  0   No current facility-administered medications for this visit.    Allergies  Allergen Reactions  . Penicillins Other (See Comments)    Unknown reaction    History   Social History  . Marital Status: Married    Spouse Name: N/A  . Number of Children:  N/A  . Years of Education: N/A   Occupational History  . Not on file.   Social History Main Topics  . Smoking status: Never Smoker   . Smokeless tobacco: Never Used  . Alcohol Use: Yes     Comment: RARE  . Drug Use: No  . Sexual Activity: Not on file   Other Topics Concern  . Not on file   Social History Narrative     Review of Systems: General: negative for chills, fever, night sweats or weight changes.  Cardiovascular: negative for chest pain, dyspnea on exertion, edema, orthopnea, palpitations, paroxysmal nocturnal dyspnea or shortness of breath Dermatological: negative for rash Respiratory: negative for cough or wheezing Urologic: negative for hematuria Abdominal: negative for nausea, vomiting, diarrhea, bright red blood per rectum, melena, or hematemesis Neurologic: negative for visual changes, syncope, or dizziness All other systems reviewed and are otherwise negative except as noted above.    Blood pressure 138/80, pulse 72, height 6\' 5"  (1.956 m), weight 310 lb (140.615 kg).  General appearance: alert and no distress Neck: no adenopathy, no carotid bruit, no JVD, supple, symmetrical, trachea midline and thyroid not enlarged, symmetric, no tenderness/mass/nodules Lungs: clear to auscultation bilaterally Heart: regular rate and rhythm, S1, S2 normal, no murmur, click, rub or gallop Extremities: extremities normal, atraumatic, no cyanosis or edema  EKG not performed today  ASSESSMENT AND PLAN:   Thoracic aortic aneurysm History of descending thoracic aortic aortic aneurysm  measuring a +4.8 cm about annually by CT angiogram. This apparently was just recently checked in September 2015. We will obtain a copy of this. He should be on annual CT angiogram surveillance.  Hyperlipidemia History of hyperlipidemia or atorvastatin 10 mg a day followed by his PCP  Essential hypertension History of hypertension with blood pressure measured at 138/80. He is on lisinopril 40  mg a day and furosemide. Continue current meds at current dosing      [name] MD [facility] 07/01/2014 8:07 AM

## 2014-07-01 NOTE — Patient Instructions (Signed)
Your physician wants you to follow-up in: 1 year with Dr Berry. You will receive a reminder letter in the mail two months in advance. If you don't receive a letter, please call our office to schedule the follow-up appointment.  

## 2014-07-01 NOTE — Assessment & Plan Note (Signed)
History of hypertension with blood pressure measured at 138/80. He is on lisinopril 40 mg a day and furosemide. Continue current meds at current dosing

## 2014-07-11 ENCOUNTER — Encounter: Payer: Self-pay | Admitting: Gastroenterology

## 2014-09-04 ENCOUNTER — Other Ambulatory Visit: Payer: Self-pay | Admitting: *Deleted

## 2014-09-04 ENCOUNTER — Telehealth: Payer: Self-pay | Admitting: *Deleted

## 2014-09-04 DIAGNOSIS — Z79899 Other long term (current) drug therapy: Secondary | ICD-10-CM

## 2014-09-04 NOTE — Telephone Encounter (Signed)
Left message for patient to call and schedule CTA of Chest at [facility] for CT and labs are in [facility]

## 2014-09-04 NOTE — Progress Notes (Signed)
Order placed for BMP prior to CT

## 2014-09-05 NOTE — Telephone Encounter (Signed)
Spoke with patient regarding appointment for CTA chest w/wo contrast for AAA.  Scheduled for 10/02/14 @ 7:55 am--arrive 7:30 am--[facility].  NPO after midnight.  Have lab work done  09/22/14.  Patient voiced his understanding.

## 2014-09-12 ENCOUNTER — Ambulatory Visit (AMBULATORY_SURGERY_CENTER): Payer: Self-pay

## 2014-09-12 VITALS — Ht 76.0 in | Wt 313.0 lb

## 2014-09-12 DIAGNOSIS — Z1211 Encounter for screening for malignant neoplasm of colon: Secondary | ICD-10-CM

## 2014-09-12 NOTE — Progress Notes (Signed)
No egg or soy allergy.  No previous complications from anesthesia. No home O2. No diet meds. 

## 2014-09-23 LAB — BASIC METABOLIC PANEL
BUN: 19 mg/dL (ref 7–25)
CHLORIDE: 102 mmol/L (ref 98–110)
CO2: 29 mmol/L (ref 20–31)
Calcium: 8.7 mg/dL (ref 8.6–10.3)
Creat: 0.9 mg/dL (ref 0.70–1.25)
GLUCOSE: 96 mg/dL (ref 65–99)
Potassium: 4.4 mmol/L (ref 3.5–5.3)
Sodium: 140 mmol/L (ref 135–146)

## 2014-09-26 ENCOUNTER — Encounter: Payer: Self-pay | Admitting: Gastroenterology

## 2014-09-26 ENCOUNTER — Ambulatory Visit (AMBULATORY_SURGERY_CENTER): Payer: BC Managed Care – PPO | Admitting: Gastroenterology

## 2014-09-26 VITALS — BP 138/73 | HR 55 | Temp 97.7°F | Resp 18 | Ht 76.0 in | Wt 313.0 lb

## 2014-09-26 DIAGNOSIS — Z1211 Encounter for screening for malignant neoplasm of colon: Secondary | ICD-10-CM

## 2014-09-26 DIAGNOSIS — D122 Benign neoplasm of ascending colon: Secondary | ICD-10-CM

## 2014-09-26 MED ORDER — SODIUM CHLORIDE 0.9 % IV SOLN: 500.0000 mL | INTRAVENOUS | Status: DC

## 2014-09-26 NOTE — Patient Instructions (Signed)
YOU HAD AN ENDOSCOPIC PROCEDURE TODAY AT THE [facility]:   Refer to the procedure report that was given to you for any specific questions about what was found during the examination.  If the procedure report does not answer your questions, please call your gastroenterologist to clarify.  If you requested that your care partner not be given the details of your procedure findings, then the procedure report has been included in a sealed envelope for you to review at your convenience later.  YOU SHOULD EXPECT: Some feelings of bloating in the abdomen. Passage of more gas than usual.  Walking can help get rid of the air that was put into your GI tract during the procedure and reduce the bloating. If you had a lower endoscopy (such as a colonoscopy or flexible sigmoidoscopy) you may notice spotting of blood in your stool or on the toilet paper. If you underwent a bowel prep for your procedure, you may not have a normal bowel movement for a few days.  Please Note:  You might notice some irritation and congestion in your nose or some drainage.  This is from the oxygen used during your procedure.  There is no need for concern and it should clear up in a day or so.  SYMPTOMS TO REPORT IMMEDIATELY:   Following lower endoscopy (colonoscopy or flexible sigmoidoscopy):  Excessive amounts of blood in the stool  Significant tenderness or worsening of abdominal pains  Swelling of the abdomen that is new, acute  Fever of 100F or higher   For urgent or emergent issues, a gastroenterologist can be reached at any hour by calling [phone].   DIET: Your first meal following the procedure should be a small meal and then it is ok to progress to your normal diet. Heavy or fried foods are harder to digest and may make you feel nauseous or bloated.  Likewise, meals heavy in dairy and vegetables can increase bloating.  Drink plenty of fluids but you should avoid alcoholic beverages for 24  hours.  ACTIVITY:  You should plan to take it easy for the rest of today and you should NOT DRIVE or use heavy machinery until tomorrow (because of the sedation medicines used during the test).    FOLLOW UP: Our staff will call the number listed on your records the next business day following your procedure to check on you and address any questions or concerns that you may have regarding the information given to you following your procedure. If we do not reach you, we will leave a message.  However, if you are feeling well and you are not experiencing any problems, there is no need to return our call.  We will assume that you have returned to your regular daily activities without incident.  If any biopsies were taken you will be contacted by phone or by letter within the next 1-3 weeks.  Please call us at [phone] if you have not heard about the biopsies in 3 weeks.    SIGNATURES/CONFIDENTIALITY: You and/or your care partner have signed paperwork which will be entered into your electronic medical record.  These signatures attest to the fact that that the information above on your After Visit Summary has been reviewed and is understood.  Full responsibility of the confidentiality of this discharge information lies with you and/or your care-partner.  Read all of the handouts given to you by your recovery room nurse. 

## 2014-09-26 NOTE — Op Note (Signed)
[facility]  [address] [place], [id number]   COLONOSCOPY PROCEDURE REPORT  PATIENT: Travis Myers, Travis Myers  MR#: [id number] BIRTHDATE: November 19, 1955 , 59  yrs. old GENDER: male ENDOSCOPIST: Inda Castle, MD REFERRED OI:NOMVE Hamrick, M.D. PROCEDURE DATE:  09/26/2014 PROCEDURE:   Colonoscopy, screening and Colonoscopy with snare polypectomy First Screening Colonoscopy - Avg.  risk and is 50 yrs.  old or older Yes.  Prior Negative Screening - Now for repeat screening. N/A  History of Adenoma - Now for follow-up colonoscopy & has been > or = to 3 yrs.  N/A  Polyps removed today? Yes ASA CLASS:   Class II INDICATIONS:Colorectal Neoplasm Risk Assessment for this procedure is average risk. MEDICATIONS: Monitored anesthesia care and Propofol 230 mg IV  DESCRIPTION OF PROCEDURE:   After the risks benefits and alternatives of the procedure were thoroughly explained, informed consent was obtained.  The digital rectal exam revealed no abnormalities of the rectum.   The LB [id number]  endoscope was introduced through the anus and advanced to the terminal ileum which was intubated for a short distance. No adverse events experienced.   The quality of the prep was (Suprep was used) excellent.  The instrument was then slowly withdrawn as the colon was fully examined. Estimated blood loss is zero unless otherwise noted in this procedure report.      COLON FINDINGS: A sessile polyp measuring 3 mm in size was found in the ascending colon.  A polypectomy was performed with a cold snare.  The resection was complete, the polyp tissue was completely retrieved and sent to histology.   The examination was otherwise normal.  Retroflexed views revealed no abnormalities. The time to cecum = 2.0 Withdrawal time = 8.8   The scope was withdrawn and the procedure completed. COMPLICATIONS: There were no immediate complications.  ENDOSCOPIC IMPRESSION: 1.   Sessile polyp was  found in the ascending colon; polypectomy was performed with a cold snare 2.   The examination was otherwise normal  RECOMMENDATIONS: If the polyp(s) removed today are proven to be adenomatous (pre-cancerous) polyps, you will need a repeat colonoscopy in 5 years.  Otherwise you should continue to follow colorectal cancer screening guidelines for "routine risk" patients with colonoscopy in 10 years.  You will receive a letter within 1-2 weeks with the results of your biopsy as well as final recommendations.  Please call my office if you have not received a letter after 3 weeks.  eSigned:  Inda Castle, MD 09/26/2014 10:54 AM   cc:   PATIENT NAME:  Travis Myers, Travis Myers MR#: [id number]

## 2014-09-26 NOTE — Progress Notes (Signed)
To recovery, report to Hodges, RN, VSS 

## 2014-09-26 NOTE — Progress Notes (Signed)
Called to room to assist during endoscopic procedure.  Patient ID and intended procedure confirmed with present staff. Received instructions for my participation in the procedure from the performing physician.  

## 2014-09-29 ENCOUNTER — Telehealth: Payer: Self-pay | Admitting: *Deleted

## 2014-09-29 NOTE — Telephone Encounter (Signed)
  Follow up Call-  Call back number 09/26/2014  Post procedure Call Back phone  # [phone]  Permission to leave phone message Yes     Patient questions:  Do you have a fever, pain , or abdominal swelling? No. Pain Score  0 *  Have you tolerated food without any problems? Yes.    Have you been able to return to your normal activities? Yes.    Do you have any questions about your discharge instructions: Diet   No. Medications  No. Follow up visit  No.  Do you have questions or concerns about your Care? No.  Actions: * If pain score is 4 or above: No action needed, pain <4.

## 2014-09-30 ENCOUNTER — Encounter: Payer: Self-pay | Admitting: Gastroenterology

## 2014-10-02 ENCOUNTER — Ambulatory Visit
Admission: RE | Admit: 2014-10-02 | Discharge: 2014-10-02 | Disposition: A | Discharge status: Home / Self Care | Payer: BC Managed Care – PPO | Source: Ambulatory Visit | Attending: Physician Assistant | Admitting: Physician Assistant

## 2014-10-02 DIAGNOSIS — I714 Abdominal aortic aneurysm, without rupture, unspecified: Secondary | ICD-10-CM

## 2014-10-02 MED ORDER — IOPAMIDOL (ISOVUE-370) INJECTION 76%
75.0000 mL | Freq: Once | INTRAVENOUS | Status: AC | PRN
  Administered 2014-10-02: 75 mL via INTRAVENOUS

## 2014-10-06 ENCOUNTER — Telehealth: Payer: Self-pay | Admitting: Cardiovascular Disease

## 2014-10-06 NOTE — Telephone Encounter (Signed)
Pt wants to know if CT scan results are back from Friday please?

## 2014-10-06 NOTE — Telephone Encounter (Signed)
Pt advised results still pending physician signature, will call when resulted. He voiced understanding.

## 2014-10-07 ENCOUNTER — Telehealth: Payer: Self-pay | Admitting: *Deleted

## 2014-10-07 DIAGNOSIS — I712 Thoracic aortic aneurysm, without rupture, unspecified: Secondary | ICD-10-CM

## 2014-10-07 NOTE — Telephone Encounter (Signed)
Per provider request -- Amb referral placed for Cardiothoracic consult.  Results discussed w/ patient. He voiced understanding, did request Dr. Gwenlyn Found call him if possible as he had a couple of unspoken inquiries. Can be reached at [phone]  Routing to Dr. Gwenlyn Found

## 2014-10-13 ENCOUNTER — Encounter: Payer: Self-pay | Admitting: Cardiothoracic Surgery

## 2014-10-13 ENCOUNTER — Institutional Professional Consult (permissible substitution) (INDEPENDENT_AMBULATORY_CARE_PROVIDER_SITE_OTHER): Payer: BC Managed Care – PPO | Admitting: Cardiothoracic Surgery

## 2014-10-13 ENCOUNTER — Other Ambulatory Visit: Payer: Self-pay | Admitting: *Deleted

## 2014-10-13 VITALS — BP 132/74 | HR 73 | Resp 16 | Ht 76.0 in | Wt 300.0 lb

## 2014-10-13 DIAGNOSIS — I712 Thoracic aortic aneurysm, without rupture, unspecified: Secondary | ICD-10-CM

## 2014-10-13 NOTE — Progress Notes (Signed)
[address]       [place], [address]             [phone]                    Truong J Feltner Myers [facility] Medical Record #[id number] Date of Birth: 06/27/1955  Referring: [name], MD Primary Care: [name], MD  Chief Complaint:    Chief Complaint  Patient presents with  . Thoracic Aortic Aneurysm    CTA CHEST 10/02/14 for surveillance for last several years    History of Present Illness:    Travis Myers 59 y.o. male is seen in the office  today at the request of Dr. Gwenlyn Found for dilated ascending aorta first noted in 2011. The dilated aorta was first noted after abnormal preop chest x-ray before hip replacement in 2011, subsequent CT scan of the chest to rule out lung mass showed multiple small pulmonary nodules which are been unchanged over the years. His ascending aorta was noted to be dilated to 4.8 cm. Subsequent follow-up CT scans of the chest have been done in 2013 2014 2015 and several weeks ago 2016. An echocardiogram was done in 2014. The patient has been asymptomatic from a cardiovascular standpoint. He has no evidence of congestive heart failure. He says these had a history of a murmur since childhood. He has no known family history of connective tissue disorder. He had a grandfather on his mother's side died of" heart disease" at age " 22 , But without any other details other than he died several hours after fishing at [employer] and bringing in a large fish. His father died at age 70 with lung cancer and COPD, he did have history of open repair of abdominal aortic aneurysm at 12.       Current Activity/ Functional Status:  Patient is independent with mobility/ambulation, transfers, ADL's, IADL's.   Zubrod Score: At the time of surgery this patient's most appropriate activity status/level should be described as: []     0    Normal activity, no symptoms []     1    Restricted in physical strenuous activity but  ambulatory, able to do out light work []     2    Ambulatory and capable of self care, unable to do work activities, up and about               >50 % of waking hours                              []     3    Only limited self care, in bed greater than 50% of waking hours []     4    Completely disabled, no self care, confined to bed or chair []     5    Moribund   Past Medical History  Diagnosis Date  . Hypertension   . Hyperlipidemia   . OA (osteoarthritis) of knee left  . Acute meniscal tear of knee left  . OSA (obstructive sleep apnea) NON-TOLERANT CPAP    no cpap  . Tinnitus of both ears   . Aneurysm, ascending aorta (HCC) 4.8cm per ct 2011/ CARDIOLOGSIT--  DR Rollene Fare  LOV  10-07-2011  NOTE W/ CHART AND REQUEST [facility] MYOVIEW AND ECHO TO BE FAXED.    ASYMPTOMATIC  . Heart murmur MILD-- ASYMPTOMATIC  . Sleep  apnea     Past Surgical History  Procedure Laterality Date  . Total hip arthroplasty  06-18-2009    OA LEFT HIP  . Veins removed from left leg  1994  . Bilateral iinguinal hernia repair  1963  . Tonsillectomy  1965  . Transthoracic echocardiogram  09/11/2012    EF 55 to 60 %,mild aortic valve regurg,mild mitral valve regurg,lf and rt atriums mildly dilated  . Doppler echocardiography  10/18/2011    EF >55 %  . Nm myocar perf wall motion  10/13/2011    EF 49 %,low risk study  . Torn meniscus left knee surgery     . Total hip arthroplasty Right 03/11/2014    Procedure: RIGHT TOTAL HIP ARTHROPLASTY ANTERIOR APPROACH;  Surgeon: [name], MD;  Location: [facility];  Service: Orthopedics;  Laterality: Right;    Family History  Problem Relation Age of Onset  . Diabetes Mother   . Cancer Mother   . Heart failure Other   . Diabetes Other   . Cancer Other   . COPD Other   . Colon cancer Neg Hx   . Stomach cancer Neg Hx     Social History   Social History  . Marital Status: Married    Spouse Name: N/A  . Number of Children: N/A  . Years of Education: N/A    Occupational History  . Not on file.   Social History Main Topics  . Smoking status: Never Smoker   . Smokeless tobacco: Former Systems developer    Quit date: 09/11/1988  . Alcohol Use: 0.6 oz/week    1 Standard drinks or equivalent per week     Comment: RARE  . Drug Use: No  . Sexual Activity: Not on file   Other Topics Concern  . Not on file   Social History Narrative    History  Smoking status  . Never Smoker   Smokeless tobacco  . Former Systems developer  . Quit date: 09/11/1988    History  Alcohol Use  . 0.6 oz/week  . 1 Standard drinks or equivalent per week    Comment: RARE     Allergies  Allergen Reactions  . Penicillins Other (See Comments)    Unknown reaction    Current Outpatient Prescriptions  Medication Sig Dispense Refill  . aspirin 81 MG tablet Take 81 mg by mouth daily.    Marland Kitchen atorvastatin (LIPITOR) 10 MG tablet Take 10 mg by mouth every morning.     . furosemide (LASIX) 80 MG tablet Take 80 mg by mouth daily.    Marland Kitchen ibuprofen (ADVIL,MOTRIN) 800 MG tablet Take 800 mg by mouth every 8 (eight) hours as needed.    Marland Kitchen lisinopril (PRINIVIL,ZESTRIL) 40 MG tablet TAKE 1 TABLET BY MOUTH EVERY DAY FOR BLOOD PRESSURE  5  . traMADol (ULTRAM) 50 MG tablet Take 50 mg by mouth every 12 (twelve) hours as needed.     No current facility-administered medications for this visit.    Review of Systems:     Cardiac Review of Systems: Y or N  Chest Pain [ n   ]  Resting SOB [n   ] Exertional SOB  [ n ]  Orthopnea [ n ]   Pedal Edema [  y ]    Palpitations [ n ] Syncope  [ n ]   Presyncope [   n]  General Review of Systems: [Y] = yes [  ]=no Constitional: recent weight change [n  ];  Wt loss  over the last 3 months [   ] anorexia [  ]; fatigue [  ]; nausea [  ]; night sweats [  ]; fever [  ]; or chills [  ];          Dental: poor dentition[ n ]; Last Dentist visit:   Eye : blurred vision [  ]; diplopia [   ]; vision changes [  ];  Amaurosis fugax[  ]; Resp: cough [  ];  wheezing[  ];   hemoptysis[n  ]; shortness of breath[n  ]; paroxysmal nocturnal dyspnea[n  ]; dyspnea on exertion[ n ]; or orthopnea[ n ];  GI:  gallstones[  ], vomiting[  ];  dysphagia[  ]; melena[  ];  hematochezia [  ]; heartburn[  ];   Hx of  Colonoscopy[y several wekks ago  ]; GU: kidney stones [  ]; hematuria[n  ];   dysuria [  ];  nocturia[  ];  history of     obstruction [  ]; urinary frequency [  ]             Skin: rash, swelling[  ];, hair loss[  ];  peripheral edema[  ];  or itching[  ]; Musculosketetal: myalgias[  ];  joint swelling[  ];  joint erythema[  ];  joint pain[  ];  back pain[  ];  Heme/Lymph: bruising[  ];  bleeding[  ];  anemia[  ];  Neuro: TIA[  ];  headaches[  ];  stroke[  ];  vertigo[  ];  seizures[ n ];   paresthesias[  ];  difficulty walking[ n ];  Psych:depression[  ]; anxiety[  ];  Endocrine: diabetes[ n ];  thyroid dysfunction[  n];  Immunizations: Flu up to date Blue.Reese  ]; Pneumococcal up to date [ y ];  Other:  Physical Exam: BP 132/74 mmHg  Pulse 73  Resp 16  Ht 6\' 4"  (1.93 m)  Wt 300 lb (136.079 kg)  BMI 36.53 kg/m2  SpO2 97%  PHYSICAL EXAMINATION: General appearance: alert, cooperative, appears stated age and no distress Head: Normocephalic, without obvious abnormality, atraumatic Neck: no adenopathy, no carotid bruit, no JVD, supple, symmetrical, trachea midline and thyroid not enlarged, symmetric, no tenderness/mass/nodules Lymph nodes: Cervical, supraclavicular, and axillary nodes normal. Resp: clear to auscultation bilaterally Back: symmetric, no curvature. ROM normal. No CVA tenderness. Cardio: regular rate and rhythm, S1, S2 normal, no murmur, click, rub or gallop GI: soft, non-tender; bowel sounds normal; no masses,  no organomegaly Extremities: Patient has a healed venous stasis ulcer at the left ankle he noted that it took months healing, no gangrene or trophic changes, patient has significant bilateral varicose veins noted and venous stasis dermatitis  noted Neurologic: Grossly normal Radial brachial femoral DP and PT pulses are present bilaterally  Patient has limitation of movement especially of his right elbow   Diagnostic Studies & Laboratory data:     Recent Radiology Findings:  ECHO 2014: Study Conclusions  - Left ventricle: The cavity size was at the upper limits of normal. There was mild-moderate concentric hypertrophy. Systolic function was normal. The estimated ejection fraction was in the range of 55% to 60%. Wall motion was normal; there were no regional wall motion abnormalities. Doppler parameters are consistent with abnormal left ventricular relaxation (grade 1 diastolic dysfunction). - Aortic valve: Mild regurgitation. Valve area: 2.05cm^2(VTI). Valve area: 2.09cm^2 (Vmax). - Mitral valve: Mild regurgitation. Valve area by continuity equation (using LVOT flow): 2.46cm^2. - Left atrium: The atrium was  mildly dilated. - Right atrium: The atrium was mildly dilated. Transthoracic echocardiography. M-mode, complete 2D, spectral Doppler, and color Doppler. Height: Height: 193cm. Height: 76in. Weight: Weight: 136.1kg. Weight: 299.4lb. Body mass index: BMI: 36.5kg/m^2. Body surface area:  BSA: 2.66m^2. Blood pressure:   158/90. Patient status: Outpatient. Location: Echo laboratory.  ------------------------------------------------------------  ------------------------------------------------------------ Left ventricle: The cavity size was at the upper limits of normal. There was mild-moderate concentric hypertrophy. Systolic function was normal. The estimated ejection fraction was in the range of 55% to 60%. Wall motion was normal; there were no regional wall motion abnormalities. Doppler parameters are consistent with abnormal left ventricular relaxation (grade 1 diastolic dysfunction).  ------------------------------------------------------------ Aortic valve:  Trileaflet;  mildly thickened leaflets. Doppler: Transvalvular velocity was within the normal range. There was no stenosis. Mild regurgitation.  VTI ratio of LVOT to aortic valve: 0.55. Valve area: 2.05cm^2(VTI). Indexed valve area: 0.78cm^2/m^2 (VTI). Peak velocity ratio of LVOT to aortic valve: 0.55. Valve area: 2.09cm^2 (Vmax). Indexed valve area: 0.79cm^2/m^2 (Vmax). Mean gradient: 9mm Hg (S). Peak gradient: 55mm Hg (S).  ------------------------------------------------------------ Aorta: The aorta was normal, not dilated, and non-diseased.  ------------------------------------------------------------ Mitral valve: No echocardiographic evidence for prolapse. Doppler:  Mild regurgitation.  Valve area by pressure half-time: 3.38cm^2. Indexed valve area by pressure half-time: 1.29cm^2/m^2. Valve area by continuity equation (using LVOT flow): 2.46cm^2. Indexed valve area by continuity equation (using LVOT flow): 0.94cm^2/m^2.  Mean gradient: 92mm Hg (D). Peak gradient: 71mm Hg (D).  ------------------------------------------------------------ Left atrium: LA Volume/BSA 34.2 ml/m2. The atrium was mildly dilated.  ------------------------------------------------------------ Atrial septum: Poorly visualized.  ------------------------------------------------------------ Right ventricle: The cavity size was normal. Wall thickness was normal. The moderator band was in a normal position and normal-sized, without evidence of outflow obstruction. Systolic function was normal. Systolic pressure was within the normal range.  ------------------------------------------------------------ Pulmonic valve:  The valve appears to be grossly normal. Doppler:  No significant regurgitation.  ------------------------------------------------------------ Tricuspid valve:  Structurally normal valve.  Leaflet separation was normal. Doppler: Transvalvular velocity was within the normal range.  Mild regurgitation.  ------------------------------------------------------------ Right atrium: The atrium was mildly dilated.  ------------------------------------------------------------ Pericardium: The pericardium was normal in appearance. There was no pericardial effusion.  ------------------------------------------------------------ Systemic veins: Inferior vena cava: The vessel was normal in size; the respirophasic diameter changes were in the normal range (= 50%); findings are consistent with normal central venous pressure.  ------------------------------------------------------------ Post procedure conclusions Ascending Aorta:  - The aorta was normal, not dilated, and non-diseased.     Ct Angio Chest Aorta W/cm &/or Wo/cm  10/02/2014   CLINICAL DATA:  History of thoracic aortic aneurysm 3 years, followup  EXAM: CT ANGIOGRAPHY CHEST WITH CONTRAST  TECHNIQUE: Multidetector CT imaging of the chest was performed using the standard protocol during bolus administration of intravenous contrast. Multiplanar CT image reconstructions and MIPs were obtained to evaluate the vascular anatomy.  CONTRAST:  75 cc Isovue 370  COMPARISON:  CT angio chest of 10/04/2013  FINDINGS: There is again fusiform dilatation of the ascending aorta noted. Measured in the same planes as measured previously, the mid thoracic ascending aorta measures 4.8 x 5.0 cm compared to 4.8 x 4.9 cm previously. Cardiothoracic surgery consultation recommended due to increased risk of rupture for arch aneurysm ? 5.5 cm. This recommendation follows 2010 ACCF/AHA/AATS/ACR/ASA/SCA/SCAI/SIR/STS/SVM Guidelines for the Diagnosis and Management of Patients With Thoracic Aortic Disease. Circulation. 2010; 121: D326-Z124. The mid thoracic aortic arch measures 2.7 cm. The mid descending thoracic aorta measured at the same level as the ascending aorta measures 2.6  cm. The distal thoracic aorta just above the diaphragmatic screw measures  2.7 cm. These measurements appear unchanged.  No mediastinal or hilar adenopathy is seen. The pulmonary arteries are not well opacified cannot and cannot be assessed. Extensive coronary artery calcifications are present primarily involving the left anterior descending artery and circumflex artery. Mild cardiomegaly is stable. 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 Correlation with surgical history is recommended. There are degenerative changes throughout the mid to lower thoracic spine.  On lung window images, no lung parenchymal abnormality is seen. No suspicious lung nodule is seen and there is no evidence of pleural effusion. The central airway is patent.  Review of the MIP images confirms the above findings.  IMPRESSION: 1. The mid ascending thoracic aorta measures 4.8 x 5.0 cm compared to 4.8 x 4.9 cm previously. Cardiothoracic surgery consultation recommended due to increased risk of rupture for arch aneurysm ? 5.5 cm. This recommendation follows 2010 ACCF/AHA/AATS/ACR/ASA/SCA/SCAI/SIR/STS/SVM Guidelines for the Diagnosis and Management of Patients With Thoracic Aortic Disease. Circulation. 2010; 121: Y865-H846 . 2. Significant coronary artery calcifications primarily involving the left anterior descending and circumflex coronary arteries.   Electronically Signed   By: Ivar Drape M.D.   On: 10/02/2014 09:17     I have independently reviewed the above radiologic studies.  Recent Lab Findings: Lab Results  Component Value Date   WBC 14.7* 03/13/2014   HGB 11.4* 03/13/2014   HCT 33.7* 03/13/2014   PLT 157 03/13/2014   GLUCOSE 96 09/22/2014   ALT 26 09/28/2012   AST 17 09/28/2012   NA 140 09/22/2014   K 4.4 09/22/2014   CL 102 09/22/2014   CREATININE 0.90 09/22/2014   BUN 19 09/22/2014   CO2 29 09/22/2014   INR 1.01 03/05/2014   HGBA1C 5.5 09/28/2012   Aortic Size Index=     5.0    /Body surface area is 2.70 meters squared.  = 1.85  < 2.75 cm/m2      4% risk per year 2.75 to 4.25          8% risk per year > 4.25 cm/m2    20% risk per year      Assessment / Plan:   I reviewed with the patient his diagnosis of ascending aortic aneurysm without cusp aortic valve and without positive family history of dilated aorta or aortic dissection. The risks and options of surgery and the timing of surgery was discussed in detail. We reviewed the signs and symptoms of acute aortic dissection. He was cautioned about doing heavy lifting. I'll plan to see him back with a repeat CTA of the chest and echocardiogram in 6 months  According to the 2010 ACC/AHA guidelines, we recommend patients with thoracic aortic disease to maintain a LDL of less than 70 and a HDL of greater than 50. We recommend their blood pressure to remain less than 135/85.     I  spent 40 minutes counseling the patient face to face and 50% or more the  time was spent in counseling and coordination of care. The total time spent in the appointment was 60 minutes.  Grace Isaac MD      [address] [place] [address] Office [phone]   Beeper [phone]  10/13/2014 3:54 PM

## 2015-04-06 ENCOUNTER — Other Ambulatory Visit: Payer: Self-pay | Admitting: *Deleted

## 2015-04-06 DIAGNOSIS — I712 Thoracic aortic aneurysm, without rupture, unspecified: Secondary | ICD-10-CM

## 2015-04-07 ENCOUNTER — Other Ambulatory Visit: Payer: Self-pay | Admitting: *Deleted

## 2015-04-15 ENCOUNTER — Other Ambulatory Visit (HOSPITAL_COMMUNITY): Payer: BC Managed Care – PPO

## 2015-04-15 ENCOUNTER — Other Ambulatory Visit: Payer: Self-pay | Admitting: *Deleted

## 2015-04-15 DIAGNOSIS — I350 Nonrheumatic aortic (valve) stenosis: Secondary | ICD-10-CM

## 2015-04-20 ENCOUNTER — Ambulatory Visit (HOSPITAL_COMMUNITY)
Admission: RE | Admit: 2015-04-20 | Discharge: 2015-04-20 | Disposition: A | Discharge status: Home / Self Care | Payer: BC Managed Care – PPO | Source: Ambulatory Visit | Attending: Cardiothoracic Surgery | Admitting: Cardiothoracic Surgery

## 2015-04-20 DIAGNOSIS — E785 Hyperlipidemia, unspecified: Secondary | ICD-10-CM | POA: Diagnosis not present

## 2015-04-20 DIAGNOSIS — I350 Nonrheumatic aortic (valve) stenosis: Secondary | ICD-10-CM | POA: Diagnosis present

## 2015-04-20 DIAGNOSIS — I352 Nonrheumatic aortic (valve) stenosis with insufficiency: Secondary | ICD-10-CM | POA: Insufficient documentation

## 2015-04-20 DIAGNOSIS — I119 Hypertensive heart disease without heart failure: Secondary | ICD-10-CM | POA: Insufficient documentation

## 2015-04-20 DIAGNOSIS — I714 Abdominal aortic aneurysm, without rupture: Secondary | ICD-10-CM | POA: Insufficient documentation

## 2015-04-20 NOTE — Progress Notes (Signed)
  Echocardiogram 2D Echocardiogram has been performed.  Travis Myers M 04/20/2015, 3:36 PM

## 2015-04-23 ENCOUNTER — Ambulatory Visit
Admission: RE | Admit: 2015-04-23 | Discharge: 2015-04-23 | Disposition: A | Discharge status: Home / Self Care | Payer: BC Managed Care – PPO | Source: Ambulatory Visit | Attending: Cardiothoracic Surgery | Admitting: Cardiothoracic Surgery

## 2015-04-23 ENCOUNTER — Encounter: Payer: Self-pay | Admitting: Cardiothoracic Surgery

## 2015-04-23 ENCOUNTER — Ambulatory Visit (INDEPENDENT_AMBULATORY_CARE_PROVIDER_SITE_OTHER): Payer: BC Managed Care – PPO | Admitting: Cardiothoracic Surgery

## 2015-04-23 VITALS — BP 144/90 | HR 68 | Resp 16 | Ht 76.0 in | Wt 290.0 lb

## 2015-04-23 DIAGNOSIS — I712 Thoracic aortic aneurysm, without rupture, unspecified: Secondary | ICD-10-CM

## 2015-04-23 DIAGNOSIS — I350 Nonrheumatic aortic (valve) stenosis: Secondary | ICD-10-CM | POA: Diagnosis not present

## 2015-04-23 MED ORDER — IOPAMIDOL (ISOVUE-370) INJECTION 76%
75.0000 mL | Freq: Once | INTRAVENOUS | Status: AC | PRN
  Administered 2015-04-23: 75 mL via INTRAVENOUS

## 2015-04-23 NOTE — Progress Notes (Addendum)
[address]       [place] [address]             [phone]                    Travis Travis Travis Travis Medical Record [id number] Date of Birth: 25-Sep-1955  Referring: [name], MD Primary Care: [name], MD  Chief Complaint:    Chief Complaint  Patient presents with  . TAA    6 month f/u with CTA CHEST today...ECHO 04/20/15    History of Present Illness:    Travis Travis 60 y.o. male is seen in the office  today at the request of Dr. Gwenlyn Found for dilated ascending aorta first noted in 2011. The dilated aorta was first noted after abnormal preop chest x-ray before hip replacement in 2011, subsequent CT scan of the chest to rule out lung mass showed multiple small pulmonary nodules which are been unchanged over the years. His ascending aorta was noted to be dilated to 4.8 cm. Subsequent follow-up CT scans of the chest have been done in 2013 2014 2015 and fall  2016. An echocardiogram was done in 2014 and repeated this week . The patient has been asymptomatic from a cardiovascular standpoint. He has no evidence of congestive heart failure. He says these had a history of a murmur since childhood. He has no known family history of connective tissue disorder. He had a grandfather on his mother's side died of" heart disease" at age " 57 , But without any other details other than he died several hours after fishing at [employer] and bringing in a large fish. His father died at age 58 with lung cancer and COPD, he did have history of open repair of abdominal aortic aneurysm at 73.     Current Activity/ Functional Status:  Patient is independent with mobility/ambulation, transfers, ADL's, IADL's.   Zubrod Score: At the time of surgery this patient's most appropriate activity status/level should be described as: [x]     0    Normal activity, no symptoms []     1    Restricted in physical strenuous activity but ambulatory, able to do out light  work []     2    Ambulatory and capable of self care, unable to do work activities, up and about               >50 % of waking hours                              []     3    Only limited self care, in bed greater than 50% of waking hours []     4    Completely disabled, no self care, confined to bed or chair []     5    Moribund   Past Medical History  Diagnosis Date  . Hypertension   . Hyperlipidemia   . OA (osteoarthritis) of knee left  . Acute meniscal tear of knee left  . OSA (obstructive sleep apnea) NON-TOLERANT CPAP    no cpap  . Tinnitus of both ears   . Aneurysm, ascending aorta (HCC) 4.8cm per ct 2011/ CARDIOLOGSIT--  DR Rollene Fare  LOV  10-07-2011  NOTE W/ CHART AND REQUEST LEXISCAN MYOVIEW AND ECHO TO BE FAXED.    ASYMPTOMATIC  . Heart murmur MILD-- ASYMPTOMATIC  . Sleep apnea  Past Surgical History  Procedure Laterality Date  . Total hip arthroplasty  06-18-2009    OA LEFT HIP  . Veins removed from left leg  1994  . Bilateral iinguinal hernia repair  1963  . Tonsillectomy  1965  . Transthoracic echocardiogram  09/11/2012    EF 55 to 60 %,mild aortic valve regurg,mild mitral valve regurg,lf and rt atriums mildly dilated  . Doppler echocardiography  10/18/2011    EF >55 %  . Nm myocar perf wall motion  10/13/2011    EF 49 %,low risk study  . Torn meniscus left knee surgery     . Total hip arthroplasty Right 03/11/2014    Procedure: RIGHT TOTAL HIP ARTHROPLASTY ANTERIOR APPROACH;  Surgeon: [name], MD;  Location: [facility];  Service: Orthopedics;  Laterality: Right;    Family History  Problem Relation Age of Onset  . Diabetes Mother   . Cancer Mother   . Heart failure Other   . Diabetes Other   . Cancer Other   . COPD Other   . Colon cancer Neg Hx   . Stomach cancer Neg Hx     Social History   Social History  . Marital Status: Married    Spouse Name: N/A  . Number of Children: N/A  . Years of Education: N/A   Occupational History  . Not on file.    Social History Main Topics  . Smoking status: Never Smoker   . Smokeless tobacco: Former Systems developer    Quit date: 09/11/1988  . Alcohol Use: 0.6 oz/week    1 Standard drinks or equivalent per week     Comment: RARE  . Drug Use: No  . Sexual Activity: Not on file   Other Topics Concern  . Not on file   Social History Narrative    History  Smoking status  . Never Smoker   Smokeless tobacco  . Former Systems developer  . Quit date: 09/11/1988    History  Alcohol Use  . 0.6 oz/week  . 1 Standard drinks or equivalent per week    Comment: RARE     Allergies  Allergen Reactions  . Penicillins Other (See Comments)    Unknown reaction    Current Outpatient Prescriptions  Medication Sig Dispense Refill  . aspirin 81 MG tablet Take 81 mg by mouth daily.    Marland Kitchen atorvastatin (LIPITOR) 10 MG tablet Take 10 mg by mouth every morning.     . furosemide (LASIX) 80 MG tablet Take 80 mg by mouth daily.    Marland Kitchen ibuprofen (ADVIL,MOTRIN) 800 MG tablet Take 800 mg by mouth every 8 (eight) hours as needed.    Marland Kitchen lisinopril (PRINIVIL,ZESTRIL) 40 MG tablet TAKE 1 TABLET BY MOUTH EVERY DAY FOR BLOOD PRESSURE  5  . traMADol (ULTRAM) 50 MG tablet Take 50 mg by mouth every 12 (twelve) hours as needed.     No current facility-administered medications for this visit.    Review of Systems:     Cardiac Review of Systems: Y or N  Chest Pain [ n   ]  Resting SOB [n   ] Exertional SOB  [ n ]  Orthopnea [ n ]   Pedal Edema [  y ]    Palpitations [ n ] Syncope  [ n ]   Presyncope [   n]  General Review of Systems: [Y] = yes [  ]=no Constitional: recent weight change [n  ];  Wt loss over the last 3 months [   ]  anorexia [  ]; fatigue [  ]; nausea [  ]; night sweats [  ]; fever [  ]; or chills [  ];          Dental: poor dentition[ n ]; Last Dentist visit:   Eye : blurred vision [  ]; diplopia [   ]; vision changes [  ];  Amaurosis fugax[  ]; Resp: cough [  ];  wheezing[  ];  hemoptysis[n  ]; shortness of breath[n  ];  paroxysmal nocturnal dyspnea[n  ]; dyspnea on exertion[ n ]; or orthopnea[ n ];  GI:  gallstones[  ], vomiting[  ];  dysphagia[  ]; melena[  ];  hematochezia [  ]; heartburn[  ];   Hx of  Colonoscopy[y several wekks ago  ]; GU: kidney stones [  ]; hematuria[n  ];   dysuria [  ];  nocturia[  ];  history of     obstruction [  ]; urinary frequency [  ]             Skin: rash, swelling[  ];, hair loss[  ];  peripheral edema[  ];  or itching[  ]; Musculosketetal: myalgias[  ];  joint swelling[  ];  joint erythema[  ];  joint pain[  ];  back pain[  ];  Heme/Lymph: bruising[  ];  bleeding[  ];  anemia[  ];  Neuro: TIA[  ];  headaches[  ];  stroke[  ];  vertigo[  ];  seizures[ n ];   paresthesias[  ];  difficulty walking[ n ];  Psych:depression[  ]; anxiety[  ];  Endocrine: diabetes[ n ];  thyroid dysfunction[  n];  Immunizations: Flu up to date Blue.Reese  ]; Pneumococcal up to date [ y ];  Other:  Physical Exam: BP 144/90 mmHg  Pulse 68  Resp 16  Ht 6\' 4"  (1.93 m)  Wt 290 lb (131.543 kg)  BMI 35.31 kg/m2  SpO2 96%  PHYSICAL EXAMINATION: General appearance: alert, cooperative, appears stated age and no distress Head: Normocephalic, without obvious abnormality, atraumatic Neck: no adenopathy, no carotid bruit, no JVD, supple, symmetrical, trachea midline and thyroid not enlarged, symmetric, no tenderness/mass/nodules Lymph nodes: Cervical, supraclavicular, and axillary nodes normal. Resp: clear to auscultation bilaterally Back: symmetric, no curvature. ROM normal. No CVA tenderness. Cardio: regular rate and rhythm, S1, S2 normal, no murmur, click, rub or gallop GI: soft, non-tender; bowel sounds normal; no masses,  no organomegaly Extremities: Patient has a healed venous stasis ulcer at the left ankle he noted that it took months healing, no gangrene or trophic changes, patient has significant bilateral varicose veins noted and venous stasis dermatitis noted Neurologic: Grossly normal Radial  brachial femoral DP and PT pulses are present bilaterally     Diagnostic Studies & Laboratory data:     Recent Radiology Findings:  Ct Angio Chest Aorta W/cm &/or Wo/cm  04/23/2015  CLINICAL DATA:  Thoracic aortic aneurysm without rupture. EXAM: CT ANGIOGRAPHY CHEST WITH CONTRAST TECHNIQUE: Multidetector CT imaging of the chest was performed using the standard protocol during bolus administration of intravenous contrast. Multiplanar CT image reconstructions and MIPs were obtained to evaluate the vascular anatomy. CONTRAST:  75 mL of Isovue 370 intravenously. COMPARISON:  CT scan of October 02, 2014. FINDINGS: No pneumothorax or pleural effusion is noted. No acute pulmonary disease is noted. Coronary artery calcifications are noted. Aortic root measures 3.3 cm in diameter. Ascending thoracic aorta has a maximum measured diameter 4.8 cm which is not significantly changed  compared to prior exam based on my own measurement. Transverse aortic arch measures 2.9 cm in diameter. Descending thoracic aorta measures 2.6 cm in diameter. No dissection is noted. No mediastinal mass or adenopathy is noted. Visualized portion of upper abdomen is unremarkable. No significant osseous abnormality is noted. Review of the MIP images confirms the above findings. IMPRESSION: Coronary artery calcifications suggesting coronary artery disease. 4.8 cm ascending thoracic aortic aneurysm is noted. This is not significantly changed compared to prior exam. Recommend semi-annual imaging followup by CTA or MRA and referral to cardiothoracic surgery if not already obtained. This recommendation follows 2010 ACCF/AHA/AATS/ACR/ASA/SCA/SCAI/SIR/STS/SVM Guidelines for the Diagnosis and Management of Patients With Thoracic Aortic Disease. Circulation. 2010; 121SP:[id number]. Electronically Signed   By: Marijo Conception, M.D.   On: 04/23/2015 14:31  Ct Angio Chest Aorta W/cm &/or Wo/cm  10/02/2014   CLINICAL DATA:  History of thoracic aortic  aneurysm 3 years, followup  EXAM: CT ANGIOGRAPHY CHEST WITH CONTRAST  TECHNIQUE: Multidetector CT imaging of the chest was performed using the standard protocol during bolus administration of intravenous contrast. Multiplanar CT image reconstructions and MIPs were obtained to evaluate the vascular anatomy.  CONTRAST:  75 cc Isovue 370  COMPARISON:  CT angio chest of 10/04/2013  FINDINGS: There is again fusiform dilatation of the ascending aorta noted. Measured in the same planes as measured previously, the mid thoracic ascending aorta measures 4.8 x 5.0 cm compared to 4.8 x 4.9 cm previously. Cardiothoracic surgery consultation recommended due to increased risk of rupture for arch aneurysm ? 5.5 cm. This recommendation follows 2010 ACCF/AHA/AATS/ACR/ASA/SCA/SCAI/SIR/STS/SVM Guidelines for the Diagnosis and Management of Patients With Thoracic Aortic Disease. Circulation. 2010; 121SP:[id number]. The mid thoracic aortic arch measures 2.7 cm. The mid descending thoracic aorta measured at the same level as the ascending aorta measures 2.6 cm. The distal thoracic aorta just above the diaphragmatic screw measures 2.7 cm. These measurements appear unchanged.  No mediastinal or hilar adenopathy is seen. The pulmonary arteries are not well opacified cannot and cannot be assessed. Extensive coronary artery calcifications are present primarily involving the left anterior descending artery and circumflex artery. Mild cardiomegaly is stable. 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 Correlation with surgical history is recommended. There are degenerative changes throughout the mid to lower thoracic spine.  On lung window images, no lung parenchymal abnormality is seen. No suspicious lung nodule is seen and there is no evidence of pleural effusion. The central airway is patent.  Review of the MIP images confirms the above findings.  IMPRESSION: 1. The mid ascending  thoracic aorta measures 4.8 x 5.0 cm compared to 4.8 x 4.9 cm previously. Cardiothoracic surgery consultation recommended due to increased risk of rupture for arch aneurysm ? 5.5 cm. This recommendation follows 2010 ACCF/AHA/AATS/ACR/ASA/SCA/SCAI/SIR/STS/SVM Guidelines for the Diagnosis and Management of Patients With Thoracic Aortic Disease. Circulation. 2010; 121: [id number] . 2. Significant coronary artery calcifications primarily involving the left anterior descending and circumflex coronary arteries.   Electronically Signed   By: Ivar Drape M.D.   On: 10/02/2014 09:17      ECHO  04/20/2015   LV EF: 60% - 65%  ------------------------------------------------------------------- Indications: Aortic stenosis 424.1.  ------------------------------------------------------------------- History: PMH: AAA. Ascending Aortic Aneurysm 51 mm. Risk factors: Hypertension. Dyslipidemia.  ------------------------------------------------------------------- Study Conclusions  - Left ventricle: The cavity size was normal. Wall thickness was  increased in a pattern of mild LVH. Systolic function was normal.  The estimated ejection fraction was in the range of 60% to 65%.  Wall motion was normal; there were no regional wall motion  abnormalities. Features are consistent with a pseudonormal left  ventricular filling pattern, with concomitant abnormal relaxation  and increased filling pressure (grade 2 diastolic dysfunction). - Aortic valve: Mildly to moderately calcified annulus. There was  mild stenosis. There was mild regurgitation. - Right atrium: The atrium was mildly dilated.  Impressions:  - The echo is technically difficult.  Transthoracic echocardiography. M-mode, complete 2D, spectral Doppler, and color Doppler. Birthdate: Patient birthdate: January 22, 1955. Age: Patient is 60 yr old. Sex: Gender: male. BMI: 36.5 kg/m^2. Blood pressure: 138/80 Patient  status: Inpatient. Study date: Study date: 04/20/2015. Study time: 02:47 PM. Location: Echo laboratory.  -------------------------------------------------------------------  ------------------------------------------------------------------- Left ventricle: The cavity size was normal. Wall thickness was increased in a pattern of mild LVH. Systolic function was normal. The estimated ejection fraction was in the range of 60% to 65%. Wall motion was normal; there were no regional wall motion abnormalities. Features are consistent with a pseudonormal left ventricular filling pattern, with concomitant abnormal relaxation and increased filling pressure (grade 2 diastolic dysfunction).  ------------------------------------------------------------------- Aortic valve: Mildly to moderately calcified annulus. Doppler: There was mild stenosis. There was mild regurgitation.  ------------------------------------------------------------------- Aorta: Aortic root: The aortic root was normal in size. Ascending aorta: The ascending aorta was normal in size.  ------------------------------------------------------------------- Mitral valve: Mildly thickened, mildly calcified leaflets . Doppler: There was no significant regurgitation. Peak gradient (D): 3 mm Hg.  ------------------------------------------------------------------- Left atrium: The atrium was normal in size.  ------------------------------------------------------------------- Right ventricle: The cavity size was normal. Systolic function was normal.  ------------------------------------------------------------------- Pulmonic valve: The valve appears to be grossly normal. Doppler: There was no significant regurgitation.  ------------------------------------------------------------------- Tricuspid valve: The valve appears to be grossly normal. Doppler: There was trivial  regurgitation.  ------------------------------------------------------------------- Right atrium: The atrium was mildly dilated.  ------------------------------------------------------------------- Pericardium: There was no pericardial effusion.   ECHO 2014: Study Conclusions  - Left ventricle: The cavity size was at the upper limits of normal. There was mild-moderate concentric hypertrophy. Systolic function was normal. The estimated ejection fraction was in the range of 55% to 60%. Wall motion was normal; there were no regional wall motion abnormalities. Doppler parameters are consistent with abnormal left ventricular relaxation (grade 1 diastolic dysfunction). - Aortic valve: Mild regurgitation. Valve area: 2.05cm^2(VTI). Valve area: 2.09cm^2 (Vmax). - Mitral valve: Mild regurgitation. Valve area by continuity equation (using LVOT flow): 2.46cm^2. - Left atrium: The atrium was mildly dilated. - Right atrium: The atrium was mildly dilated. Transthoracic echocardiography. M-mode, complete 2D, spectral Doppler, and color Doppler. Height: Height: 193cm. Height: 76in. Weight: Weight: 136.1kg. Weight: 299.4lb. Body mass index: BMI: 36.5kg/m^2. Body surface area:  BSA: 2.73m^2. Blood pressure:   158/90. Patient status: Outpatient. Location: Echo laboratory.  ------------------------------------------------------------  ------------------------------------------------------------ Left ventricle: The cavity size was at the upper limits of normal. There was mild-moderate concentric hypertrophy. Systolic function was normal. The estimated ejection fraction was in the range of 55% to 60%. Wall motion was normal; there were no regional wall motion abnormalities. Doppler parameters are consistent with abnormal left ventricular relaxation (grade 1 diastolic dysfunction).  ------------------------------------------------------------ Aortic valve:   Trileaflet; mildly thickened leaflets. Doppler: Transvalvular velocity was within the normal range. There was no stenosis. Mild regurgitation.  VTI ratio of LVOT to aortic valve: 0.55. Valve area: 2.05cm^2(VTI). Indexed valve area: 0.78cm^2/m^2 (VTI). Peak velocity ratio of LVOT to aortic valve: 0.55. Valve area: 2.09cm^2 (Vmax). Indexed valve  area: 0.79cm^2/m^2 (Vmax). Mean gradient: 31mm Hg (S). Peak gradient: 35mm Hg (S).  ------------------------------------------------------------ Aorta: The aorta was normal, not dilated, and non-diseased.  ------------------------------------------------------------ Mitral valve: No echocardiographic evidence for prolapse. Doppler:  Mild regurgitation.  Valve area by pressure half-time: 3.38cm^2. Indexed valve area by pressure half-time: 1.29cm^2/m^2. Valve area by continuity equation (using LVOT flow): 2.46cm^2. Indexed valve area by continuity equation (using LVOT flow): 0.94cm^2/m^2.  Mean gradient: 32mm Hg (D). Peak gradient: 15mm Hg (D).  ------------------------------------------------------------ Left atrium: LA Volume/BSA 34.2 ml/m2. The atrium was mildly dilated.  ------------------------------------------------------------ Atrial septum: Poorly visualized.  ------------------------------------------------------------ Right ventricle: The cavity size was normal. Wall thickness was normal. The moderator band was in a normal position and normal-sized, without evidence of outflow obstruction. Systolic function was normal. Systolic pressure was within the normal range.  ------------------------------------------------------------ Pulmonic valve:  The valve appears to be grossly normal. Doppler:  No significant regurgitation.  ------------------------------------------------------------ Tricuspid valve:  Structurally normal valve.  Leaflet separation was normal. Doppler: Transvalvular velocity was within the  normal range. Mild regurgitation.  ------------------------------------------------------------ Right atrium: The atrium was mildly dilated.  ------------------------------------------------------------ Pericardium: The pericardium was normal in appearance. There was no pericardial effusion.  ------------------------------------------------------------ Systemic veins: Inferior vena cava: The vessel was normal in size; the respirophasic diameter changes were in the normal range (= 50%); findings are consistent with normal central venous pressure.  ------------------------------------------------------------ Post procedure conclusions Ascending Aorta:  - The aorta was normal, not dilated, and non-diseased.     Ct Angio Chest Aorta W/cm &/or Wo/cm  10/02/2014   CLINICAL DATA:  History of thoracic aortic aneurysm 3 years, followup  EXAM: CT ANGIOGRAPHY CHEST WITH CONTRAST  TECHNIQUE: Multidetector CT imaging of the chest was performed using the standard protocol during bolus administration of intravenous contrast. Multiplanar CT image reconstructions and MIPs were obtained to evaluate the vascular anatomy.  CONTRAST:  75 cc Isovue 370  COMPARISON:  CT angio chest of 10/04/2013  FINDINGS: There is again fusiform dilatation of the ascending aorta noted. Measured in the same planes as measured previously, the mid thoracic ascending aorta measures 4.8 x 5.0 cm compared to 4.8 x 4.9 cm previously. Cardiothoracic surgery consultation recommended due to increased risk of rupture for arch aneurysm ? 5.5 cm. This recommendation follows 2010 ACCF/AHA/AATS/ACR/ASA/SCA/SCAI/SIR/STS/SVM Guidelines for the Diagnosis and Management of Patients With Thoracic Aortic Disease. Circulation. 2010; [id number]. The mid thoracic aortic arch measures 2.7 cm. The mid descending thoracic aorta measured at the same level as the ascending aorta measures 2.6 cm. The distal thoracic aorta just above the diaphragmatic  screw measures 2.7 cm. These measurements appear unchanged.  No mediastinal or hilar adenopathy is seen. The pulmonary arteries are not well opacified cannot and cannot be assessed. Extensive coronary artery calcifications are present primarily involving the left anterior descending artery and circumflex artery. Mild cardiomegaly is stable. 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 Correlation with surgical history is recommended. There are degenerative changes throughout the mid to lower thoracic spine.  On lung window images, no lung parenchymal abnormality is seen. No suspicious lung nodule is seen and there is no evidence of pleural effusion. The central airway is patent.  Review of the MIP images confirms the above findings.  IMPRESSION: 1. The mid ascending thoracic aorta measures 4.8 x 5.0 cm compared to 4.8 x 4.9 cm previously. Cardiothoracic surgery consultation recommended due to increased risk of rupture for arch aneurysm ? 5.5  cm. This recommendation follows 2010 ACCF/AHA/AATS/ACR/ASA/SCA/SCAI/SIR/STS/SVM Guidelines for the Diagnosis and Management of Patients With Thoracic Aortic Disease. Circulation. 2010; 121: [id number] . 2. Significant coronary artery calcifications primarily involving the left anterior descending and circumflex coronary arteries.   Electronically Signed   By: Ivar Drape M.D.   On: 10/02/2014 09:17     I have independently reviewed the above radiologic studies.  Recent Lab Findings: Lab Results  Component Value Date   WBC 14.7* 03/13/2014   HGB 11.4* 03/13/2014   HCT 33.7* 03/13/2014   PLT 157 03/13/2014   GLUCOSE 96 09/22/2014   ALT 26 09/28/2012   AST 17 09/28/2012   NA 140 09/22/2014   K 4.4 09/22/2014   CL 102 09/22/2014   CREATININE 0.90 09/22/2014   BUN 19 09/22/2014   CO2 29 09/22/2014   INR 1.01 03/05/2014   HGBA1C 5.5 09/28/2012   Aortic Size Index=     4.8   /Body surface area is 2.66  meters squared. = 1.85  < 2.75 cm/m2      4% risk per year 2.75 to 4.25          8% risk per year > 4.25 cm/m2    20% risk per year      Assessment / Plan: Coronary artery calcifications suggesting coronary artery disease.  4.8 cm ascending thoracic aortic aneurysm is noted. This is not significantly changed compared to prior exam I reviewed with the patient his diagnosis of ascending aortic aneurysm without trileaflet aortic valve and without positive family history of dilated aorta or aortic dissection. The risks and options of surgery and the timing of surgery was discussed in detail. We reviewed the signs and symptoms of acute aortic dissection.  I'll plan to see him back with a repeat CTA of the chest and echocardiogram in 12 months   Patient told  It's best to avoid activities that cause grunting or straining (medically referred to as a "valsalva maneuver"). This happens when a person bears down against a closed throat to increase the strength of arm or abdominal muscles. There's often a tendency to do this when lifting heavy weights, doing sit-ups, push-ups or chin-ups, etc., but it may be harmful.    According to the 2010 ACC/AHA guidelines, we recommend patients with thoracic aortic disease to maintain a LDL of less than 70 and a HDL of greater than 50. We recommend their blood pressure to remain less than 135/85.    He will discuss with Dr. Gwenlyn Found adding beta blocker to his current antihypertensive therapy.  Grace Isaac MD      [address] [place] [address] Office [phone]   Beeper [phone]  04/23/2015 4:29 PM

## 2015-04-23 NOTE — Patient Instructions (Signed)
It's best to avoid activities that cause grunting or straining (medically referred to as a "valsalva maneuver"). This happens when a person bears down against a closed throat to increase the strength of arm or abdominal muscles. There's often a tendency to do this when lifting heavy weights, doing sit-ups, push-ups or chin-ups, etc., but it may be harmful.

## 2015-06-30 ENCOUNTER — Ambulatory Visit (INDEPENDENT_AMBULATORY_CARE_PROVIDER_SITE_OTHER): Payer: BC Managed Care – PPO | Admitting: Cardiovascular Disease

## 2015-06-30 ENCOUNTER — Encounter: Payer: Self-pay | Admitting: Cardiovascular Disease

## 2015-06-30 VITALS — BP 159/102 | HR 62 | Ht 76.0 in | Wt 309.6 lb

## 2015-06-30 DIAGNOSIS — I712 Thoracic aortic aneurysm, without rupture, unspecified: Secondary | ICD-10-CM

## 2015-06-30 DIAGNOSIS — I10 Essential (primary) hypertension: Secondary | ICD-10-CM | POA: Diagnosis not present

## 2015-06-30 DIAGNOSIS — E785 Hyperlipidemia, unspecified: Secondary | ICD-10-CM

## 2015-06-30 NOTE — Assessment & Plan Note (Signed)
History of hyperlipidemia on statin therapy followed by his PCP 

## 2015-06-30 NOTE — Assessment & Plan Note (Signed)
History of hypertension with blood pressure measured at 152/98. He is on lisinopril 40 mg. I do not think addition of a beta blocker at this point is a good idea given his relative bradycardia currently. He is in a lot of pain because of recent knee replacement surgery. He does avoid salt Continue current meds at current dosing.

## 2015-06-30 NOTE — Assessment & Plan Note (Signed)
History of thoracic aortic aneurysm measuring approximately 51 mm by CTA followed by Dr. Servando Snare

## 2015-06-30 NOTE — Progress Notes (Signed)
06/30/2015 Warner Mccreedy II   1955-05-19  [id number]  Primary Physician [name], MD Primary Cardiologist: [name] MD Renae Gloss  HPI:  Mr. Ferlita is a 60 year old moderately overweight married Caucasian male with no children who works in the Transport planner at [employer]. He was previously a patient of Dr. Georgiann Mccoy His primary care provider is Cyndi Bender he PA-C at [facility]. I last saw him in the office 07/01/14. He has a history of treated hypertension and hyperlipidemia. There is no family history. He has never had a heart attack or stroke. He denies chest pain or shortness of breath. He does have a thoracic aortic aneurysm measuring 51 mm by CT angiogram performed in September last year. This is followed on an annual basis. He had a negative Myoview stress test performed in February of this year done for preoperative clearance before a right total hip replacement done via the anterior approach by Dr. Alvan Dame . He is recuperated from this nicely. He had a right meniscal surgery by Dr. Theda Sers as well. Recent CT angiogram showed that his thoracic aortic aneurysm measured 51 mm and this is being followed by Dr. Servando Snare .   Current Outpatient Prescriptions  Medication Sig Dispense Refill  . aspirin 81 MG tablet Take 81 mg by mouth daily.    Marland Kitchen atorvastatin (LIPITOR) 10 MG tablet Take 10 mg by mouth every morning.     . furosemide (LASIX) 80 MG tablet Take 80 mg by mouth daily.    Marland Kitchen ibuprofen (ADVIL,MOTRIN) 800 MG tablet Take 800 mg by mouth every 8 (eight) hours as needed.    Marland Kitchen lisinopril (PRINIVIL,ZESTRIL) 40 MG tablet TAKE 1 TABLET BY MOUTH EVERY DAY FOR BLOOD PRESSURE  5  . traMADol (ULTRAM) 50 MG tablet Take 50 mg by mouth every 12 (twelve) hours as needed.    . traMADol-acetaminophen (ULTRACET) 37.5-325 MG tablet Take 1 tablet by mouth as directed.  5   No current facility-administered medications for this visit.     Allergies  Allergen Reactions  . Penicillins Other (See Comments)    Unknown reaction    Social History   Social History  . Marital Status: Married    Spouse Name: N/A  . Number of Children: N/A  . Years of Education: N/A   Occupational History  . Not on file.   Social History Main Topics  . Smoking status: Never Smoker   . Smokeless tobacco: Former Systems developer    Quit date: 09/11/1988  . Alcohol Use: 0.6 oz/week    1 Standard drinks or equivalent per week     Comment: RARE  . Drug Use: No  . Sexual Activity: Not on file   Other Topics Concern  . Not on file   Social History Narrative     Review of Systems: General: negative for chills, fever, night sweats or weight changes.  Cardiovascular: negative for chest pain, dyspnea on exertion, edema, orthopnea, palpitations, paroxysmal nocturnal dyspnea or shortness of breath Dermatological: negative for rash Respiratory: negative for cough or wheezing Urologic: negative for hematuria Abdominal: negative for nausea, vomiting, diarrhea, bright red blood per rectum, melena, or hematemesis Neurologic: negative for visual changes, syncope, or dizziness All other systems reviewed and are otherwise negative except as noted above.    Blood pressure 152/98, pulse 62, height 6\' 4"  (1.93 m), weight 309 lb 9.6 oz (140.434 kg).  General appearance: alert and no distress Neck: no adenopathy, no carotid  bruit, no JVD, supple, symmetrical, trachea midline and thyroid not enlarged, symmetric, no tenderness/mass/nodules Lungs: clear to auscultation bilaterally Heart: regular rate and rhythm, S1, S2 normal, no murmur, click, rub or gallop Extremities: extremities normal, atraumatic, no cyanosis or edema  EKG normal sinus rhythm at 62 with ST or T-wave changes. I personally reviewed this EKG  ASSESSMENT AND PLAN:   Thoracic aortic aneurysm History of thoracic aortic aneurysm measuring approximately 51 mm by CTA followed by Dr. Servando Snare    Essential hypertension History of hypertension with blood pressure measured at 152/98. He is on lisinopril 40 mg. I do not think addition of a beta blocker at this point is a good idea given his relative bradycardia currently. He is in a lot of pain because of recent knee replacement surgery. He does avoid salt Continue current meds at current dosing.  Hyperlipidemia History of hyperlipidemia on statin therapy followed by his PCP      [name] MD [facility], [facility] 06/30/2015 8:38 AM

## 2015-06-30 NOTE — Patient Instructions (Signed)
Medication Instructions:  Your physician recommends that you continue on your current medications as directed. Please refer to the Current Medication list given to you today.   Labwork: NONE  Testing/Procedures: NONE  Follow-Up: Your physician wants you to follow-up in: [place]. You will receive a reminder letter in the mail two months in advance. If you don't receive a letter, please call our office to schedule the follow-up appointment.  You have been referred to BP CLINIC WITH PHARMACIST IN 1 MONTH.  Your physician has requested that you regularly monitor and record your blood pressure readings at home. Please use the same machine at the same time of day to check your readings and record them to bring to your follow-up visit. BRING IN YOUR BP CUFF AND READINGS TO YOUR APPT WITH THE BP CLINIC.  DR Gwenlyn Found RECOMMENDS AN OMRON BLOOD PRESSURE CUFF.      Any Other Special Instructions Will Be Listed Below (If Applicable).     If you need a refill on your cardiac medications before your next appointment, please call your pharmacy.

## 2015-07-17 ENCOUNTER — Encounter: Payer: Self-pay | Admitting: Cardiovascular Disease

## 2015-07-30 ENCOUNTER — Encounter: Payer: Self-pay | Admitting: Pharmacist Clinician (PhC)/ Clinical Pharmacy Specialist

## 2015-07-30 ENCOUNTER — Ambulatory Visit (INDEPENDENT_AMBULATORY_CARE_PROVIDER_SITE_OTHER): Payer: BC Managed Care – PPO | Admitting: Pharmacist Clinician (PhC)/ Clinical Pharmacy Specialist

## 2015-07-30 VITALS — BP 156/84 | HR 60 | Ht 76.0 in | Wt 307.8 lb

## 2015-07-30 DIAGNOSIS — I10 Essential (primary) hypertension: Secondary | ICD-10-CM | POA: Diagnosis not present

## 2015-07-30 NOTE — Patient Instructions (Signed)
Call in 3-4 weeks and let us know what your home BP readings are.  [phone].  Your blood pressure today is 156/84  (goal is to be < 150/90)  Check your blood pressure at home several days each week and keep record of the readings.  Take your BP meds as follows: continue with lisinopril.    Try cutting back on ibuprofen, use the hydrocodone instead.  Bring all of your meds, your BP cuff and your record of home blood pressures to your next appointment.  Exercise as you're able, try to walk approximately 30 minutes per day.  Keep salt intake to a minimum, especially watch canned and prepared boxed foods.  Eat more fresh fruits and vegetables and fewer canned items.  Avoid eating in fast food restaurants.    HOW TO TAKE YOUR BLOOD PRESSURE: . Rest 5 minutes before taking your blood pressure. .  Don't smoke or drink caffeinated beverages for at least 30 minutes before. . Take your blood pressure before (not after) you eat. . Sit comfortably with your back supported and both feet on the floor (don't cross your legs). . Elevate your arm to heart level on a table or a desk. . Use the proper sized cuff. It should fit smoothly and snugly around your bare upper arm. There should be enough room to slip a fingertip under the cuff. The bottom edge of the cuff should be 1 inch above the crease of the elbow. . Ideally, take 3 measurements at one sitting and record the average.

## 2015-07-30 NOTE — Assessment & Plan Note (Signed)
Today his BP remains above goal, but I suspect this is due more to pain and anti-inflammatory use than cardiac issues.  He states he has hydrocodone at home and I suggested he use it some and cut back on the ibuprofen.  He will continue with daily home BP checks, instructions on proper technique were given.  Once he determines cause of knee pain and gets it resolved I suspect BP will come back into normal range.  He is to call if it does not decrease as his pain does.

## 2015-07-30 NOTE — Progress Notes (Signed)
07/30/2015 Travis Myers 01-06-1956 [id number]   HPI:  Travis Myers is a 60 y.o. male patient of Dr. Gwenlyn Found with a PMH below who presents today for hypertension clinic evaluation.  He states his BP has been mostly well controlled for many years, but since developing knee problems in the past couple of months has seen an elevated BP.  He had knee surgery to repair a torn meniscus, but this did not solve his pain.  He is now scheduled to see a back specialist to determine if the problem is a pinched nerve.  To combat the pain he is taking tramadol/apap and ibuprofen 800 mg every 6 hours around the clock (he usually gets up about 3 am to take a dose of each).    Blood Pressure Goal:  150/90     Current Medications:  Lisinopril 40 mg  Cardiac Hx:  Thoracic aortic aneurysm, hyperlipidemia, hypertension  Family Hx:  Both parents had hypertension  Social Hx:  No tobacco or alcohol, drinks 5+ mugs of coffee per day, has since he was in his teens  Diet:  Mostly home cooked meals, does not add salt  Exercise:  Currently limited due to knee problems, but has horse farm and stays active with that  Home BP readings:  Brought 10 most recent readings, average 158/87 with range of 116-170/76-95  Intolerances:   No cardiac medication intolerances  Wt Readings from Last 3 Encounters:  07/30/15 307 lb 12.8 oz (139.617 kg)  06/30/15 309 lb 9.6 oz (140.434 kg)  04/23/15 290 lb (131.543 kg)   BP Readings from Last 3 Encounters:  07/30/15 156/84  06/30/15 159/102  04/23/15 144/90   Pulse Readings from Last 3 Encounters:  07/30/15 60  06/30/15 62  04/23/15 68    Current Outpatient Prescriptions  Medication Sig Dispense Refill  . aspirin 81 MG tablet Take 81 mg by mouth daily.    Marland Kitchen atorvastatin (LIPITOR) 10 MG tablet Take 10 mg by mouth every morning.     . furosemide (LASIX) 80 MG tablet Take 80 mg by mouth daily.    Marland Kitchen ibuprofen (ADVIL,MOTRIN) 800 MG tablet Take  800 mg by mouth every 8 (eight) hours as needed.    Marland Kitchen lisinopril (PRINIVIL,ZESTRIL) 40 MG tablet TAKE 1 TABLET BY MOUTH EVERY DAY FOR BLOOD PRESSURE  5  . traMADol (ULTRAM) 50 MG tablet Take 50 mg by mouth every 12 (twelve) hours as needed.    . traMADol-acetaminophen (ULTRACET) 37.5-325 MG tablet Take 1 tablet by mouth as directed.  5   No current facility-administered medications for this visit.    Allergies  Allergen Reactions  . Penicillins Other (See Comments)    Unknown reaction    Past Medical History  Diagnosis Date  . Hypertension   . Hyperlipidemia   . OA (osteoarthritis) of knee left  . Acute meniscal tear of knee left  . OSA (obstructive sleep apnea) NON-TOLERANT CPAP    no cpap  . Tinnitus of both ears   . Aneurysm, ascending aorta (HCC) 4.8cm per ct 2011/ CARDIOLOGSIT--  DR Rollene Fare  LOV  10-07-2011  NOTE W/ CHART AND REQUEST LEXISCAN MYOVIEW AND ECHO TO BE FAXED.    ASYMPTOMATIC  . Heart murmur MILD-- ASYMPTOMATIC  . Sleep apnea     Blood pressure 156/84, pulse 60, height 6\' 4"  (1.93 m), weight 307 lb 12.8 oz (139.617 kg).    Tommy Medal PharmD CPP [facility]

## 2016-01-21 ENCOUNTER — Other Ambulatory Visit: Payer: Self-pay | Admitting: Specialist

## 2016-01-21 DIAGNOSIS — M5136 Other intervertebral disc degeneration, lumbar region: Secondary | ICD-10-CM

## 2016-01-28 ENCOUNTER — Ambulatory Visit
Admission: RE | Admit: 2016-01-28 | Discharge: 2016-01-28 | Disposition: A | Discharge status: Home / Self Care | Payer: BC Managed Care – PPO | Source: Ambulatory Visit | Attending: Specialist | Admitting: Specialist

## 2016-01-28 DIAGNOSIS — M5136 Other intervertebral disc degeneration, lumbar region: Secondary | ICD-10-CM

## 2016-01-28 MED ORDER — IOPAMIDOL (ISOVUE-M 200) INJECTION 41%
15.0000 mL | Freq: Once | INTRAMUSCULAR | Status: AC
  Administered 2016-01-28: 15 mL via INTRATHECAL

## 2016-01-28 MED ORDER — ONDANSETRON HCL 4 MG/2ML IJ SOLN
4.0000 mg | Freq: Once | INTRAMUSCULAR | Status: AC
  Administered 2016-01-28: 4 mg via INTRAMUSCULAR

## 2016-01-28 MED ORDER — ONDANSETRON HCL 4 MG/2ML IJ SOLN: 4.0000 mg | Freq: Four times a day (QID) | INTRAMUSCULAR | Status: DC | PRN

## 2016-01-28 MED ORDER — DIAZEPAM 5 MG PO TABS
10.0000 mg | ORAL_TABLET | Freq: Once | ORAL | Status: AC
  Administered 2016-01-28: 10 mg via ORAL

## 2016-01-28 MED ORDER — MEPERIDINE HCL 100 MG/ML IJ SOLN
100.0000 mg | Freq: Once | INTRAMUSCULAR | Status: AC
  Administered 2016-01-28: 100 mg via INTRAMUSCULAR

## 2016-01-28 NOTE — Progress Notes (Signed)
Patient states he has been off Tramadol for at least the past two days.  jkl 

## 2016-01-28 NOTE — Discharge Instructions (Addendum)
Myelogram Discharge Instructions  1. Go home and rest quietly for the next 24 hours.  It is important to lie flat for the next 24 hours.  Get up only to go to the restroom.  You may lie in the bed or on a couch on your back, your stomach, your left side or your right side.  You may have one pillow under your head.  You may have pillows between your knees while you are on your side or under your knees while you are on your back.  2. DO NOT drive today.  Recline the seat as far back as it will go, while still wearing your seat belt, on the way home.  3. You may get up to go to the bathroom as needed.  You may sit up for 10 minutes to eat.  You may resume your normal diet and medications unless otherwise indicated.  Drink plenty of extra fluids today and tomorrow.  4. The incidence of a spinal headache with nausea and/or vomiting is about 5% (one in 20 patients).  If you develop a headache, lie flat and drink plenty of fluids until the headache goes away.  Caffeinated beverages may be helpful.  If you develop severe nausea and vomiting or a headache that does not go away with flat bed rest, call [phone].  5. You may resume normal activities after your 24 hours of bed rest is over; however, do not exert yourself strongly or do any heavy lifting tomorrow.  6. Call your physician for a follow-up appointment.    You may resume Tramadol on Friday, January 29, 2016 after 12:30p.m.

## 2016-02-02 ENCOUNTER — Ambulatory Visit: Payer: Self-pay | Admitting: Orthopedic Surgery

## 2016-02-04 ENCOUNTER — Ambulatory Visit: Payer: Self-pay | Admitting: Orthopedic Surgery

## 2016-02-04 NOTE — H&P (Signed)
Travis Myers is an 61 y.o. male.   Chief Complaint: back and leg pain HPI: The patient is a 61 year old male who presents today for follow up of their back. The patient is being followed for their back pain. They are now 5 day(s) out from most recent flare up. Symptoms reported today include: pain and leg pain (right). Current treatment includes: NSAIDs, pain medications and Gabapentin. The following medication has been used for pain control: antiinflammatory medication and Ultram. The patient reports their current pain level to be 10 / 10.  Travis Myers reports back pain, worse with activity, better with rest, mainly radiating the buttock and down into his legs fairly severely. He would like to proceed with further imaging and further discussion concerning surgical intervention. He had an injection by Dr. Nelva Bush on the right at L4-5. He had temporary relief from that. He also had it on the left. No relief from that, he had a disc protrusion central and to the left at 4-5. Advanced facet arthrosis at 5-1.  He also reports knee pain, would like cortisone injection in his knee.  Past Medical History:  Diagnosis Date  . Acute meniscal tear of knee left  . Aneurysm, ascending aorta (HCC) 4.8cm per ct 2011/ CARDIOLOGSIT--  DR Rollene Fare  LOV  10-07-2011  NOTE W/ CHART AND REQUEST LEXISCAN MYOVIEW AND ECHO TO BE FAXED.   ASYMPTOMATIC  . Heart murmur MILD-- ASYMPTOMATIC  . Hyperlipidemia   . Hypertension   . OA (osteoarthritis) of knee left  . OSA (obstructive sleep apnea) NON-TOLERANT CPAP   no cpap  . Sleep apnea   . Tinnitus of both ears     Past Surgical History:  Procedure Laterality Date  . [place]  . DOPPLER ECHOCARDIOGRAPHY  10/18/2011   EF >55 %  . NM MYOCAR PERF WALL MOTION  10/13/2011   EF 49 %,low risk study  . TONSILLECTOMY  1965  . torn meniscus left knee surgery     . TOTAL HIP ARTHROPLASTY  06-18-2009   OA LEFT HIP  . TOTAL HIP  ARTHROPLASTY Right 03/11/2014   Procedure: RIGHT TOTAL HIP ARTHROPLASTY ANTERIOR APPROACH;  Surgeon: [name], MD;  Location: [facility];  Service: Orthopedics;  Laterality: Right;  . TRANSTHORACIC ECHOCARDIOGRAM  09/11/2012   EF 55 to 60 %,mild aortic valve regurg,mild mitral valve regurg,lf and rt atriums mildly dilated  . VARICOSE VEIN SURGERY Left 1980  . veins removed from left leg  1994    Family History  Problem Relation Age of Onset  . Diabetes Mother   . Cancer Mother   . Heart failure Other   . Diabetes Other   . Cancer Other   . COPD Other   . Colon cancer Neg Hx   . Stomach cancer Neg Hx    Social History:  reports that he has never smoked. He quit smokeless tobacco use about 27 years ago. He reports that he drinks about 0.6 oz of alcohol per week . He reports that he does not use drugs.  Allergies:  Allergies  Allergen Reactions  . Penicillins Other (See Comments)    Unknown reaction     (Not in a hospital admission)  No results found for this or any previous visit (from the past 48 hour(s)). No results found.  Review of Systems  Constitutional: Negative.   HENT: Negative.   Eyes: Negative.   Respiratory: Negative.   Cardiovascular: Negative.   Gastrointestinal:  Negative.   Genitourinary: Negative.   Musculoskeletal: Positive for back pain and joint pain.  Skin: Negative.   Neurological: Positive for sensory change and focal weakness.  Psychiatric/Behavioral: Negative.     There were no vitals taken for this visit. Physical Exam  Constitutional: He is oriented to person, place, and time. He appears well-developed and well-nourished. He appears distressed.  HENT:  Head: Normocephalic.  Eyes: Pupils are equal, round, and reactive to light.  Neck: Normal range of motion.  Cardiovascular: Normal rate.   Respiratory: Effort normal.  GI: Soft.  Musculoskeletal:  Straight leg raise, buttock, thigh and calf on the right; negative on the right left. Tender  medial joint line. Patellofemoral pain with compression. Trace effusion. Knee exam on inspection reveals no evidence of soft tissue swelling, ecchymosis, deformity or erythema. Nontender over the fibular head or the peroneal nerve. Nontender over the quadriceps insertion of the patellar ligament insertion. The range of motion was full. Provocative maneuvers revealed a negative Lachman, negative anterior and posterior drawer and a negative McMurray. No instability was noted with varus and valgus stressing at 0 or 30 degrees. On manual motor test the quadriceps and hamstrings were 5/5. Sensory exam was intact to light touch.  Pain with extension of the lumbar spine. Lumbar spine exam reveals no evidence of soft tissue swelling, deformity or skin ecchymosis. On palpation there is no tenderness of the lumbar spine. No flank pain with percussion. The abdomen is soft and nontender. Nontender over the trochanters. No cellulitis or lymphadenopathy.  Motor is 5/5 including EHL, tibialis anterior, plantar flexion, quadriceps and hamstrings. Patient is normoreflexic. There is no Babinski or clonus. Sensory exam is intact to light touch. Patient has good distal pulses. No DVT. No pain and normal range of motion without instability of the hips, knees and ankles.  Neurological: He is alert and oriented to person, place, and time.  Skin: Skin is warm and dry.  Psychiatric: He has a normal mood and affect.     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 Disc protrusion at 4-5 on the left, refractory.  1. We will proceed with a corticosteroid injection of the knee. After discussing the risks and benefits of an injection, I sterilely prepped the knee with Betadine and alcohol and under strict, sterile conditions injected the patient with 1 mL of Aristospan and 8 mL of 1% Lidocaine solution with reduction of pain. Post-injection instructions were given. 2. We discussed further  delineation of his pathology, specifically flexion- extension CT myelogram and lateral bending to determine whether he has neural compression at 4-5 and at 5-1. I will give him a call with that result. We discussed microlumbar decompression. In terms of his knee, he will follow up with Dr. Theda Sers with his knee if still symptomatic.  I called him with the result of the CT myelogram. He has got moderately large disc herniation, paracentral to the left; severe, left lateral recess stenosis with displacement of the 5 root. He does have some lateral recess stenosis on the right, more so on the left. There is neural foraminal narrowing noted there as well, seen on his MRI.  Again, he had epidurals at 4-5 on the right which gave him relief; on the left, that did not. I suspect this is a tethering type of pathology that is affecting the contralateral side. As he again has had relief from epidural steroid injections, he has L4-L5 radicular pain and it is reasonable to proceed with a decompression.  I did discuss with him given the duration of the symptoms, I would suggest a bilateral decompression to decompress the 5 root on the left and also on the right. He may have a venous plexus. He does have neural foraminal narrowing on the right, stenosis on the right as well and he probably has a dynamic neural compression of L4 on the right. I discussed this with him in detail including the risks and benefits. There is a portion of the superior articulating process of L4 on the right, that is narrowing the neural foramen. The disc is centrally and off to the right as well as the left. We will proceed accordingly. We discussed the risks and benefits. It is on February 14th.  Any changes in the symptoms in the interim, he is to call.  Plan microlumbar decompression L4-5  Cecilie Kicks., PA-C for Dr. Tonita Cong 02/04/2016, 11:18 AM

## 2016-02-16 ENCOUNTER — Encounter (HOSPITAL_COMMUNITY): Payer: Self-pay

## 2016-02-18 ENCOUNTER — Encounter (HOSPITAL_COMMUNITY): Payer: Self-pay

## 2016-02-18 ENCOUNTER — Ambulatory Visit (HOSPITAL_COMMUNITY)
Admission: RE | Admit: 2016-02-18 | Discharge: 2016-02-18 | Disposition: A | Discharge status: Home / Self Care | Payer: BC Managed Care – PPO | Source: Ambulatory Visit | Attending: Anesthesiology | Admitting: Anesthesiology

## 2016-02-18 ENCOUNTER — Encounter (HOSPITAL_COMMUNITY)
Admission: RE | Admit: 2016-02-18 | Discharge: 2016-02-18 | Disposition: A | Discharge status: Home / Self Care | Payer: BC Managed Care – PPO | Source: Ambulatory Visit | Attending: Specialist | Admitting: Specialist

## 2016-02-18 ENCOUNTER — Ambulatory Visit (HOSPITAL_COMMUNITY)
Admission: RE | Admit: 2016-02-18 | Discharge: 2016-02-18 | Disposition: A | Discharge status: Home / Self Care | Payer: BC Managed Care – PPO | Source: Ambulatory Visit | Attending: Orthopedic Surgery | Admitting: Orthopedic Surgery

## 2016-02-18 DIAGNOSIS — Z01818 Encounter for other preprocedural examination: Secondary | ICD-10-CM | POA: Diagnosis present

## 2016-02-18 DIAGNOSIS — J111 Influenza due to unidentified influenza virus with other respiratory manifestations: Secondary | ICD-10-CM

## 2016-02-18 DIAGNOSIS — M5126 Other intervertebral disc displacement, lumbar region: Secondary | ICD-10-CM

## 2016-02-18 DIAGNOSIS — I517 Cardiomegaly: Secondary | ICD-10-CM | POA: Insufficient documentation

## 2016-02-18 DIAGNOSIS — M48061 Spinal stenosis, lumbar region without neurogenic claudication: Secondary | ICD-10-CM | POA: Insufficient documentation

## 2016-02-18 DIAGNOSIS — I771 Stricture of artery: Secondary | ICD-10-CM | POA: Diagnosis not present

## 2016-02-18 HISTORY — DX: Pneumonia, unspecified organism: J18.9

## 2016-02-18 HISTORY — DX: Prediabetes: R73.03

## 2016-02-18 LAB — CBC
HEMATOCRIT: 39.4 % (ref 39.0–52.0)
Hemoglobin: 13.5 g/dL (ref 13.0–17.0)
MCH: 31.4 pg (ref 26.0–34.0)
MCHC: 34.3 g/dL (ref 30.0–36.0)
MCV: 91.6 fL (ref 78.0–100.0)
PLATELETS: 199 10*3/uL (ref 150–400)
RBC: 4.3 MIL/uL (ref 4.22–5.81)
RDW: 13.3 % (ref 11.5–15.5)
WBC: 5.3 10*3/uL (ref 4.0–10.5)

## 2016-02-18 LAB — BASIC METABOLIC PANEL
Anion gap: 6 (ref 5–15)
BUN: 25 mg/dL — AB (ref 6–20)
CHLORIDE: 103 mmol/L (ref 101–111)
CO2: 30 mmol/L (ref 22–32)
CREATININE: 0.98 mg/dL (ref 0.61–1.24)
Calcium: 9 mg/dL (ref 8.9–10.3)
GFR calc Af Amer: >60 mL/min (ref >60)
GFR calc non Af Amer: >60 mL/min (ref >60)
GLUCOSE: 111 mg/dL — AB (ref 65–99)
Potassium: 4.7 mmol/L (ref 3.5–5.1)
Sodium: 139 mmol/L (ref 135–145)

## 2016-02-18 LAB — SURGICAL PCR SCREEN
MRSA, PCR: NEGATIVE
Staphylococcus aureus: NEGATIVE

## 2016-02-18 NOTE — Progress Notes (Signed)
BMPdone 02/18/16 routed via epic to Dr. Tonita Cong

## 2016-02-18 NOTE — Patient Instructions (Signed)
Ona Sedgwick Gillies II  02/18/2016   Your procedure is scheduled on: 02/24/16  Report to [facility] Main  Entrance take [place]  elevators to 3rd floor to  [place] at      [phone].  Call this number if you have problems the morning of surgery [phone]   Remember: ONLY 1 PERSON MAY GO WITH YOU TO SHORT STAY TO GET  READY MORNING OF [place].  Do not eat food or drink liquids :After Midnight.     Take these medicines the morning of surgery with A SIP OF WATER:  Gabapentin, Metoprolol DO NOT TAKE ANY DIABETIC MEDICATIONS DAY OF YOUR SURGERY                               You may not have any metal on your body including hair pins and              piercings  Do not wear jewelry,lotions, powders or perfumes, deodorant              Men may shave face and neck.   Do not bring valuables to the hospital. [place].  Contacts, dentures or bridgework may not be worn into surgery.  Leave suitcase in the car. After surgery it may be brought to your room.                  Please read over the following fact sheets you were given: _____________________________________________________________________             [facility] - Preparing for Surgery Before surgery, you can play an important role.  Because skin is not sterile, your skin needs to be as free of germs as possible.  You can reduce the number of germs on your skin by washing with CHG (chlorahexidine gluconate) soap before surgery.  CHG is an antiseptic cleaner which kills germs and bonds with the skin to continue killing germs even after washing. Please DO NOT use if you have an allergy to CHG or antibacterial soaps.  If your skin becomes reddened/irritated stop using the CHG and inform your nurse when you arrive at Short Stay. Do not shave (including legs and underarms) for at least 48 hours prior to the first CHG shower.  You may shave your  face/neck. Please follow these instructions carefully:  1.  Shower with CHG Soap the night before surgery and the  morning of Surgery.  2.  If you choose to wash your hair, wash your hair first as usual with your  normal  shampoo.  3.  After you shampoo, rinse your hair and body thoroughly to remove the  shampoo.                           4.  Use CHG as you would any other liquid soap.  You can apply chg directly  to the skin and wash                       Gently with a scrungie or clean washcloth.  5.  Apply the CHG Soap to your body ONLY FROM THE NECK DOWN.   Do not use  on face/ open                           Wound or open sores. Avoid contact with eyes, ears mouth and genitals (private parts).                       Wash face,  Genitals (private parts) with your normal soap.             6.  Wash thoroughly, paying special attention to the area where your surgery  will be performed.  7.  Thoroughly rinse your body with warm water from the neck down.  8.  DO NOT shower/wash with your normal soap after using and rinsing off  the CHG Soap.                9.  Pat yourself dry with a clean towel.            10.  Wear clean pajamas.            11.  Place clean sheets on your bed the night of your first shower and do not  sleep with pets. Day of Surgery : Do not apply any lotions/deodorants the morning of surgery.  Please wear clean clothes to the hospital/surgery center.  FAILURE TO FOLLOW THESE INSTRUCTIONS MAY RESULT IN THE CANCELLATION OF YOUR SURGERY PATIENT SIGNATURE_________________________________  NURSE SIGNATURE__________________________________  ________________________________________________________________________

## 2016-02-18 NOTE — Progress Notes (Signed)
Pt scored 6 on STOP BANG apnea screening. Pt. Has known sleep apnea but does not tolerate the mask.FYI

## 2016-02-18 NOTE — Progress Notes (Addendum)
EKG 06/30/15  Echo 04/20/15 Stress test 02/28/14  LOV 06/17/15  Dr. Newell Coral ov 4/17 CT angio chest 4/17  All in epic

## 2016-02-19 LAB — HEMOGLOBIN A1C
Hgb A1c MFr Bld: 5.6 % (ref 4.8–5.6)
MEAN PLASMA GLUCOSE: 114 mg/dL

## 2016-02-23 MED ORDER — DEXTROSE 5 % IV SOLN
3.0000 g | INTRAVENOUS | Status: AC
  Administered 2016-02-24: 3 g via INTRAVENOUS
  Filled 2016-02-23 (×2): qty 3000

## 2016-02-24 ENCOUNTER — Encounter (HOSPITAL_COMMUNITY)
Admission: RE | Disposition: A | Discharge status: Home / Self Care | Payer: Self-pay | Source: Ambulatory Visit | Attending: Specialist

## 2016-02-24 ENCOUNTER — Ambulatory Visit (HOSPITAL_COMMUNITY)
Admission: RE | Admit: 2016-02-24 | Discharge: 2016-02-25 | Disposition: A | Discharge status: Home / Self Care | Payer: BC Managed Care – PPO | Source: Ambulatory Visit | Attending: Specialist | Admitting: Specialist

## 2016-02-24 ENCOUNTER — Ambulatory Visit (HOSPITAL_COMMUNITY): Payer: BC Managed Care – PPO | Admitting: Anesthesiology

## 2016-02-24 ENCOUNTER — Ambulatory Visit (HOSPITAL_COMMUNITY): Payer: BC Managed Care – PPO

## 2016-02-24 ENCOUNTER — Encounter (HOSPITAL_COMMUNITY): Payer: Self-pay | Admitting: *Deleted

## 2016-02-24 DIAGNOSIS — M5126 Other intervertebral disc displacement, lumbar region: Secondary | ICD-10-CM

## 2016-02-24 DIAGNOSIS — G4733 Obstructive sleep apnea (adult) (pediatric): Secondary | ICD-10-CM | POA: Diagnosis not present

## 2016-02-24 DIAGNOSIS — E785 Hyperlipidemia, unspecified: Secondary | ICD-10-CM | POA: Insufficient documentation

## 2016-02-24 DIAGNOSIS — Z880 Allergy status to penicillin: Secondary | ICD-10-CM | POA: Diagnosis not present

## 2016-02-24 DIAGNOSIS — I119 Hypertensive heart disease without heart failure: Secondary | ICD-10-CM | POA: Diagnosis not present

## 2016-02-24 DIAGNOSIS — I739 Peripheral vascular disease, unspecified: Secondary | ICD-10-CM | POA: Insufficient documentation

## 2016-02-24 DIAGNOSIS — R7303 Prediabetes: Secondary | ICD-10-CM | POA: Insufficient documentation

## 2016-02-24 DIAGNOSIS — Z96643 Presence of artificial hip joint, bilateral: Secondary | ICD-10-CM | POA: Diagnosis not present

## 2016-02-24 DIAGNOSIS — Z419 Encounter for procedure for purposes other than remedying health state, unspecified: Secondary | ICD-10-CM

## 2016-02-24 DIAGNOSIS — M1712 Unilateral primary osteoarthritis, left knee: Secondary | ICD-10-CM | POA: Diagnosis not present

## 2016-02-24 DIAGNOSIS — Z7982 Long term (current) use of aspirin: Secondary | ICD-10-CM | POA: Diagnosis not present

## 2016-02-24 DIAGNOSIS — R011 Cardiac murmur, unspecified: Secondary | ICD-10-CM | POA: Insufficient documentation

## 2016-02-24 DIAGNOSIS — M48061 Spinal stenosis, lumbar region without neurogenic claudication: Secondary | ICD-10-CM | POA: Insufficient documentation

## 2016-02-24 DIAGNOSIS — Z6835 Body mass index (BMI) 35.0-35.9, adult: Secondary | ICD-10-CM | POA: Insufficient documentation

## 2016-02-24 DIAGNOSIS — M5116 Intervertebral disc disorders with radiculopathy, lumbar region: Secondary | ICD-10-CM | POA: Insufficient documentation

## 2016-02-24 HISTORY — PX: LUMBAR LAMINECTOMY/DECOMPRESSION MICRODISCECTOMY: SHX5026

## 2016-02-24 SURGERY — LUMBAR LAMINECTOMY/DECOMPRESSION MICRODISCECTOMY 1 LEVEL
Anesthesia: General | Site: Back

## 2016-02-24 MED ORDER — KCL IN DEXTROSE-NACL 20-5-0.45 MEQ/L-%-% IV SOLN
INTRAVENOUS | Status: AC
  Administered 2016-02-24: 13:00:00 via INTRAVENOUS
  Filled 2016-02-24 (×2): qty 1000

## 2016-02-24 MED ORDER — PROPOFOL 10 MG/ML IV BOLUS
INTRAVENOUS | Status: AC
  Filled 2016-02-24: qty 40

## 2016-02-24 MED ORDER — DEXAMETHASONE SODIUM PHOSPHATE 10 MG/ML IJ SOLN
INTRAMUSCULAR | Status: AC
  Filled 2016-02-24: qty 1

## 2016-02-24 MED ORDER — VITAMIN C 500 MG PO TABS: 500.0000 mg | ORAL_TABLET | Freq: Three times a day (TID) | ORAL | Status: DC | PRN

## 2016-02-24 MED ORDER — LIDOCAINE-EPINEPHRINE (PF) 1 %-1:200000 IJ SOLN
INTRAMUSCULAR | Status: AC
  Filled 2016-02-24: qty 30

## 2016-02-24 MED ORDER — MIDAZOLAM HCL 2 MG/2ML IJ SOLN
INTRAMUSCULAR | Status: AC
  Filled 2016-02-24: qty 2

## 2016-02-24 MED ORDER — LIDOCAINE 2% (20 MG/ML) 5 ML SYRINGE
INTRAMUSCULAR | Status: DC | PRN
  Administered 2016-02-24: 100 mg via INTRAVENOUS

## 2016-02-24 MED ORDER — THROMBIN 5000 UNITS EX SOLR
OROMUCOSAL | Status: DC | PRN
  Administered 2016-02-24: 10 mL via TOPICAL

## 2016-02-24 MED ORDER — OXYCODONE-ACETAMINOPHEN 5-325 MG PO TABS
1.0000 | ORAL_TABLET | ORAL | Status: DC | PRN
  Administered 2016-02-24: 1 via ORAL
  Administered 2016-02-24: 19:00:00 2 via ORAL
  Administered 2016-02-24: 1 via ORAL
  Administered 2016-02-25 (×2): 2 via ORAL
  Filled 2016-02-24: qty 1
  Filled 2016-02-24: qty 2
  Filled 2016-02-24: qty 1
  Filled 2016-02-24 (×2): qty 2

## 2016-02-24 MED ORDER — HYDROCODONE-ACETAMINOPHEN 5-325 MG PO TABS: 1.0000 | ORAL_TABLET | ORAL | Status: DC | PRN

## 2016-02-24 MED ORDER — LACTATED RINGERS IV SOLN
INTRAVENOUS | Status: DC
  Administered 2016-02-24 (×3): via INTRAVENOUS

## 2016-02-24 MED ORDER — MECLIZINE HCL 25 MG PO TABS
25.0000 mg | ORAL_TABLET | Freq: Every day | ORAL | Status: DC
  Administered 2016-02-24 – 2016-02-25 (×2): 25 mg via ORAL
  Filled 2016-02-24 (×2): qty 1

## 2016-02-24 MED ORDER — DEXTROSE 5 % IV SOLN
3.0000 g | Freq: Three times a day (TID) | INTRAVENOUS | Status: AC
  Administered 2016-02-24 – 2016-02-25 (×2): 3 g via INTRAVENOUS
  Filled 2016-02-24 (×2): qty 3

## 2016-02-24 MED ORDER — BISACODYL 5 MG PO TBEC: 5.0000 mg | DELAYED_RELEASE_TABLET | Freq: Every day | ORAL | Status: DC | PRN

## 2016-02-24 MED ORDER — POLYMYXIN B SULFATE 500000 UNITS IJ SOLR
INTRAMUSCULAR | Status: AC
  Filled 2016-02-24: qty 500000

## 2016-02-24 MED ORDER — HYDROMORPHONE HCL 2 MG/ML IJ SOLN
INTRAMUSCULAR | Status: AC
  Filled 2016-02-24: qty 1

## 2016-02-24 MED ORDER — BUPIVACAINE HCL (PF) 0.5 % IJ SOLN
INTRAMUSCULAR | Status: AC
  Filled 2016-02-24: qty 30

## 2016-02-24 MED ORDER — THROMBIN 5000 UNITS EX SOLR
CUTANEOUS | Status: AC
  Filled 2016-02-24: qty 10000

## 2016-02-24 MED ORDER — SUCCINYLCHOLINE CHLORIDE 200 MG/10ML IV SOSY
PREFILLED_SYRINGE | INTRAVENOUS | Status: AC
  Filled 2016-02-24: qty 10

## 2016-02-24 MED ORDER — MIDAZOLAM HCL 5 MG/5ML IJ SOLN
INTRAMUSCULAR | Status: DC | PRN
  Administered 2016-02-24: 2 mg via INTRAVENOUS

## 2016-02-24 MED ORDER — HYDROMORPHONE HCL 1 MG/ML IJ SOLN
INTRAMUSCULAR | Status: AC
  Filled 2016-02-24: qty 1

## 2016-02-24 MED ORDER — LIDOCAINE-EPINEPHRINE (PF) 1 %-1:200000 IJ SOLN
INTRAMUSCULAR | Status: DC | PRN
  Administered 2016-02-24: 16 mL

## 2016-02-24 MED ORDER — POLYETHYLENE GLYCOL 3350 17 G PO PACK: 17.0000 g | PACK | Freq: Every day | ORAL | 0 refills | Status: DC

## 2016-02-24 MED ORDER — ROCURONIUM BROMIDE 50 MG/5ML IV SOSY
PREFILLED_SYRINGE | INTRAVENOUS | Status: AC
  Filled 2016-02-24: qty 5

## 2016-02-24 MED ORDER — SUCCINYLCHOLINE CHLORIDE 200 MG/10ML IV SOSY
PREFILLED_SYRINGE | INTRAVENOUS | Status: DC | PRN
  Administered 2016-02-24: 120 mg via INTRAVENOUS

## 2016-02-24 MED ORDER — METHOCARBAMOL 500 MG PO TABS
500.0000 mg | ORAL_TABLET | Freq: Four times a day (QID) | ORAL | Status: DC | PRN
  Administered 2016-02-25: 02:00:00 500 mg via ORAL
  Filled 2016-02-24: qty 1

## 2016-02-24 MED ORDER — OXYCODONE-ACETAMINOPHEN 10-325 MG PO TABS: 1.0000 | ORAL_TABLET | ORAL | 0 refills | Status: DC | PRN

## 2016-02-24 MED ORDER — HYDROMORPHONE HCL 1 MG/ML IJ SOLN
0.2500 mg | INTRAMUSCULAR | Status: DC | PRN
  Administered 2016-02-24 (×2): 0.25 mg via INTRAVENOUS

## 2016-02-24 MED ORDER — ONDANSETRON HCL 4 MG/2ML IJ SOLN: 4.0000 mg | INTRAMUSCULAR | Status: DC | PRN

## 2016-02-24 MED ORDER — MENTHOL 3 MG MT LOZG: 1.0000 | LOZENGE | OROMUCOSAL | Status: DC | PRN

## 2016-02-24 MED ORDER — HYDROMORPHONE HCL 1 MG/ML IJ SOLN: 0.5000 mg | INTRAMUSCULAR | Status: DC | PRN

## 2016-02-24 MED ORDER — ALUM & MAG HYDROXIDE-SIMETH 200-200-20 MG/5ML PO SUSP: 30.0000 mL | Freq: Four times a day (QID) | ORAL | Status: DC | PRN

## 2016-02-24 MED ORDER — DOCUSATE SODIUM 100 MG PO CAPS
100.0000 mg | ORAL_CAPSULE | Freq: Two times a day (BID) | ORAL | Status: DC
  Administered 2016-02-24 – 2016-02-25 (×3): 100 mg via ORAL
  Filled 2016-02-24 (×3): qty 1

## 2016-02-24 MED ORDER — ONDANSETRON HCL 4 MG/2ML IJ SOLN
INTRAMUSCULAR | Status: AC
  Filled 2016-02-24: qty 2

## 2016-02-24 MED ORDER — ROCURONIUM BROMIDE 10 MG/ML (PF) SYRINGE
PREFILLED_SYRINGE | INTRAVENOUS | Status: DC | PRN
  Administered 2016-02-24 (×2): 10 mg via INTRAVENOUS
  Administered 2016-02-24: 50 mg via INTRAVENOUS

## 2016-02-24 MED ORDER — FUROSEMIDE 40 MG PO TABS
40.0000 mg | ORAL_TABLET | Freq: Every day | ORAL | Status: DC
Start: 2016-02-25 — End: 2016-02-25
  Administered 2016-02-25: 11:00:00 40 mg via ORAL
  Filled 2016-02-24: qty 1

## 2016-02-24 MED ORDER — TRAMADOL-ACETAMINOPHEN 37.5-325 MG PO TABS
1.0000 | ORAL_TABLET | Freq: Three times a day (TID) | ORAL | Status: DC
  Administered 2016-02-24 – 2016-02-25 (×2): 2 via ORAL
  Filled 2016-02-24: qty 2

## 2016-02-24 MED ORDER — ONDANSETRON HCL 4 MG/2ML IJ SOLN
INTRAMUSCULAR | Status: DC | PRN
  Administered 2016-02-24: 4 mg via INTRAVENOUS

## 2016-02-24 MED ORDER — RISAQUAD PO CAPS
1.0000 | ORAL_CAPSULE | Freq: Every day | ORAL | Status: DC
  Administered 2016-02-24 – 2016-02-25 (×2): 1 via ORAL
  Filled 2016-02-24 (×2): qty 1

## 2016-02-24 MED ORDER — DOCUSATE SODIUM 100 MG PO CAPS: 100.0000 mg | ORAL_CAPSULE | Freq: Two times a day (BID) | ORAL | 1 refills | Status: DC | PRN

## 2016-02-24 MED ORDER — METHOCARBAMOL 1000 MG/10ML IJ SOLN
500.0000 mg | Freq: Four times a day (QID) | INTRAVENOUS | Status: DC | PRN
  Administered 2016-02-24: 500 mg via INTRAVENOUS
  Filled 2016-02-24 (×2): qty 5

## 2016-02-24 MED ORDER — LISINOPRIL 20 MG PO TABS
40.0000 mg | ORAL_TABLET | Freq: Every day | ORAL | Status: DC
  Filled 2016-02-24: qty 2

## 2016-02-24 MED ORDER — LIDOCAINE 2% (20 MG/ML) 5 ML SYRINGE
INTRAMUSCULAR | Status: AC
  Filled 2016-02-24: qty 5

## 2016-02-24 MED ORDER — EPHEDRINE SULFATE-NACL 50-0.9 MG/10ML-% IV SOSY
PREFILLED_SYRINGE | INTRAVENOUS | Status: DC | PRN
  Administered 2016-02-24 (×3): 5 mg via INTRAVENOUS

## 2016-02-24 MED ORDER — METOPROLOL SUCCINATE ER 50 MG PO TB24
50.0000 mg | ORAL_TABLET | Freq: Every day | ORAL | Status: DC
  Administered 2016-02-25: 11:00:00 50 mg via ORAL
  Filled 2016-02-24: qty 1

## 2016-02-24 MED ORDER — LISINOPRIL 20 MG PO TABS: 40.0000 mg | ORAL_TABLET | Freq: Once | ORAL | Status: DC

## 2016-02-24 MED ORDER — POLYETHYLENE GLYCOL 3350 17 G PO PACK: 17.0000 g | PACK | Freq: Every day | ORAL | Status: DC | PRN

## 2016-02-24 MED ORDER — MAGNESIUM CITRATE PO SOLN: 1.0000 | Freq: Once | ORAL | Status: DC | PRN

## 2016-02-24 MED ORDER — SODIUM CHLORIDE 0.9 % IR SOLN
Status: DC | PRN
  Administered 2016-02-24: 500 mL

## 2016-02-24 MED ORDER — HYDROMORPHONE HCL 1 MG/ML IJ SOLN
INTRAMUSCULAR | Status: DC | PRN
  Administered 2016-02-24 (×2): 0.5 mg via INTRAVENOUS

## 2016-02-24 MED ORDER — PROPOFOL 10 MG/ML IV BOLUS
INTRAVENOUS | Status: DC | PRN
  Administered 2016-02-24: 200 mg via INTRAVENOUS

## 2016-02-24 MED ORDER — ACETAMINOPHEN 325 MG PO TABS: 650.0000 mg | ORAL_TABLET | ORAL | Status: DC | PRN

## 2016-02-24 MED ORDER — SUGAMMADEX SODIUM 500 MG/5ML IV SOLN
INTRAVENOUS | Status: DC | PRN
  Administered 2016-02-24: 500 mg via INTRAVENOUS

## 2016-02-24 MED ORDER — GABAPENTIN 300 MG PO CAPS
300.0000 mg | ORAL_CAPSULE | Freq: Two times a day (BID) | ORAL | Status: DC
  Administered 2016-02-24 – 2016-02-25 (×2): 300 mg via ORAL
  Filled 2016-02-24 (×2): qty 1

## 2016-02-24 MED ORDER — FENTANYL CITRATE (PF) 100 MCG/2ML IJ SOLN
INTRAMUSCULAR | Status: AC
  Filled 2016-02-24: qty 4

## 2016-02-24 MED ORDER — ACETAMINOPHEN 650 MG RE SUPP: 650.0000 mg | RECTAL | Status: DC | PRN

## 2016-02-24 MED ORDER — PHENOL 1.4 % MT LIQD
1.0000 | OROMUCOSAL | Status: DC | PRN
  Filled 2016-02-24: qty 177

## 2016-02-24 MED ORDER — METHOCARBAMOL 500 MG PO TABS: 500.0000 mg | ORAL_TABLET | Freq: Four times a day (QID) | ORAL | 1 refills | Status: DC | PRN

## 2016-02-24 MED ORDER — FENTANYL CITRATE (PF) 100 MCG/2ML IJ SOLN
INTRAMUSCULAR | Status: DC | PRN
  Administered 2016-02-24: 200 ug via INTRAVENOUS

## 2016-02-24 MED ORDER — DEXAMETHASONE SODIUM PHOSPHATE 10 MG/ML IJ SOLN
INTRAMUSCULAR | Status: DC | PRN
  Administered 2016-02-24: 10 mg via INTRAVENOUS

## 2016-02-24 SURGICAL SUPPLY — 48 items
BAG ZIPLOCK 12X15 (MISCELLANEOUS) IMPLANT
CLEANER TIP ELECTROSURG 2X2 (MISCELLANEOUS) ×3 IMPLANT
CLOSURE WOUND 1/2 X4 (GAUZE/BANDAGES/DRESSINGS) ×1
CLOTH 2% CHLOROHEXIDINE 3PK (PERSONAL CARE ITEMS) ×3 IMPLANT
DRAPE MICROSCOPE LEICA (MISCELLANEOUS) ×3 IMPLANT
DRAPE POUCH INSTRU U-SHP 10X18 (DRAPES) ×3 IMPLANT
DRAPE SHEET LG 3/4 BI-LAMINATE (DRAPES) ×3 IMPLANT
DRAPE SURG 17X11 SM STRL (DRAPES) ×3 IMPLANT
DRAPE UTILITY XL STRL (DRAPES) ×3 IMPLANT
DRSG AQUACEL AG ADV 3.5X 4 (GAUZE/BANDAGES/DRESSINGS) IMPLANT
DRSG AQUACEL AG ADV 3.5X 6 (GAUZE/BANDAGES/DRESSINGS) ×3 IMPLANT
DURAPREP 26ML APPLICATOR (WOUND CARE) ×3 IMPLANT
DURASEAL SPINE SEALANT 3ML (MISCELLANEOUS) IMPLANT
ELECT BLADE TIP CTD 4 INCH (ELECTRODE) IMPLANT
ELECT REM PT RETURN 9FT ADLT (ELECTROSURGICAL) ×3
ELECTRODE REM PT RTRN 9FT ADLT (ELECTROSURGICAL) ×1 IMPLANT
GLOVE BIOGEL PI IND STRL 7.0 (GLOVE) ×1 IMPLANT
GLOVE BIOGEL PI INDICATOR 7.0 (GLOVE) ×2
GLOVE SURG SS PI 7.0 STRL IVOR (GLOVE) ×3 IMPLANT
GLOVE SURG SS PI 7.5 STRL IVOR (GLOVE) ×3 IMPLANT
GLOVE SURG SS PI 8.0 STRL IVOR (GLOVE) ×6 IMPLANT
GOWN STRL REUS W/TWL XL LVL3 (GOWN DISPOSABLE) ×6 IMPLANT
HEMOSTAT SPONGE AVITENE ULTRA (HEMOSTASIS) IMPLANT
IV CATH 14GX2 1/4 (CATHETERS) ×3 IMPLANT
KIT BASIN OR (CUSTOM PROCEDURE TRAY) ×3 IMPLANT
KIT POSITIONING SURG ANDREWS (MISCELLANEOUS) ×3 IMPLANT
MANIFOLD NEPTUNE II (INSTRUMENTS) ×3 IMPLANT
NEEDLE SPNL 18GX3.5 QUINCKE PK (NEEDLE) ×6 IMPLANT
PACK LAMINECTOMY ORTHO (CUSTOM PROCEDURE TRAY) ×3 IMPLANT
PATTIES SURGICAL .5 X.5 (GAUZE/BANDAGES/DRESSINGS) IMPLANT
PATTIES SURGICAL .75X.75 (GAUZE/BANDAGES/DRESSINGS) IMPLANT
PATTIES SURGICAL 1X1 (DISPOSABLE) IMPLANT
RUBBERBAND STERILE (MISCELLANEOUS) ×6 IMPLANT
SPONGE SURGIFOAM ABS GEL 100 (HEMOSTASIS) ×3 IMPLANT
STAPLER VISISTAT (STAPLE) IMPLANT
STRIP CLOSURE SKIN 1/2X4 (GAUZE/BANDAGES/DRESSINGS) ×2 IMPLANT
SUT NURALON 4 0 TR CR/8 (SUTURE) IMPLANT
SUT PROLENE 3 0 PS 2 (SUTURE) ×3 IMPLANT
SUT VIC AB 1 CT1 27 (SUTURE)
SUT VIC AB 1 CT1 27XBRD ANTBC (SUTURE) IMPLANT
SUT VIC AB 1-0 CT2 27 (SUTURE) ×3 IMPLANT
SUT VIC AB 2-0 CT1 27 (SUTURE)
SUT VIC AB 2-0 CT1 TAPERPNT 27 (SUTURE) IMPLANT
SUT VIC AB 2-0 CT2 27 (SUTURE) ×3 IMPLANT
SYR 3ML LL SCALE MARK (SYRINGE) IMPLANT
TOWEL OR 17X26 10 PK STRL BLUE (TOWEL DISPOSABLE) ×3 IMPLANT
TOWEL OR NON WOVEN STRL DISP B (DISPOSABLE) IMPLANT
YANKAUER SUCT BULB TIP NO VENT (SUCTIONS) IMPLANT

## 2016-02-24 NOTE — Anesthesia Procedure Notes (Signed)
Procedure Name: Intubation Date/Time: 02/24/2016 8:30 AM Performed by: Montel Clock Pre-anesthesia Checklist: Patient identified, Emergency Drugs available, Suction available, Patient being monitored and Timeout performed Patient Re-evaluated:Patient Re-evaluated prior to inductionOxygen Delivery Method: Circle system utilized Preoxygenation: Pre-oxygenation with 100% oxygen Intubation Type: IV induction Ventilation: Mask ventilation without difficulty Laryngoscope Size: Mac and 4 Grade View: Grade II Tube type: Oral Number of attempts: 1 Airway Equipment and Method: Stylet Placement Confirmation: ETT inserted through vocal cords under direct vision,  positive ETCO2 and breath sounds checked- equal and bilateral Secured at: 22 cm Tube secured with: Tape Dental Injury: Teeth and Oropharynx as per pre-operative assessment  Comments: Intubated by Jackquline Bosch SRNA

## 2016-02-24 NOTE — Anesthesia Preprocedure Evaluation (Signed)
Anesthesia Evaluation  Patient identified by MRN, date of birth, ID band Patient awake    Reviewed: Allergy & Precautions, NPO status , Patient's Chart, lab work & pertinent test results  Airway Mallampati: II  TM Distance: >3 FB     Dental   Pulmonary sleep apnea , pneumonia,    breath sounds clear to auscultation       Cardiovascular hypertension, + Peripheral Vascular Disease  + Valvular Problems/Murmurs  Rhythm:Regular Rate:Normal     Neuro/Psych    GI/Hepatic negative GI ROS, Neg liver ROS,   Endo/Other  negative endocrine ROS  Renal/GU      Musculoskeletal  (+) Arthritis ,   Abdominal   Peds  Hematology negative hematology ROS (+)   Anesthesia Other Findings   Reproductive/Obstetrics                             Anesthesia Physical Anesthesia Plan  ASA: III  Anesthesia Plan: General   Post-op Pain Management:    Induction: Intravenous  Airway Management Planned: Oral ETT  Additional Equipment:   Intra-op Plan:   Post-operative Plan: Extubation in OR  Informed Consent: I have reviewed the patients History and Physical, chart, labs and discussed the procedure including the risks, benefits and alternatives for the proposed anesthesia with the patient or authorized representative who has indicated his/her understanding and acceptance.   Dental advisory given  Plan Discussed with: CRNA and Anesthesiologist  Anesthesia Plan Comments:         Anesthesia Quick Evaluation

## 2016-02-24 NOTE — Discharge Instructions (Signed)

## 2016-02-24 NOTE — H&P (View-Only) (Signed)
Travis Myers is an 61 y.o. male.   Chief Complaint: back and leg pain HPI: The patient is a 61 year old male who presents today for follow up of their back. The patient is being followed for their back pain. They are now 5 day(s) out from most recent flare up. Symptoms reported today include: pain and leg pain (right). Current treatment includes: NSAIDs, pain medications and Gabapentin. The following medication has been used for pain control: antiinflammatory medication and Ultram. The patient reports their current pain level to be 10 / 10.  Travis Myers reports back pain, worse with activity, better with rest, mainly radiating the buttock and down into his legs fairly severely. He would like to proceed with further imaging and further discussion concerning surgical intervention. He had an injection by Dr. Nelva Bush on the right at L4-5. He had temporary relief from that. He also had it on the left. No relief from that, he had a disc protrusion central and to the left at 4-5. Advanced facet arthrosis at 5-1.  He also reports knee pain, would like cortisone injection in his knee.  Past Medical History:  Diagnosis Date  . Acute meniscal tear of knee left  . Aneurysm, ascending aorta (HCC) 4.8cm per ct 2011/ CARDIOLOGSIT--  DR Rollene Fare  LOV  10-07-2011  NOTE W/ CHART AND REQUEST LEXISCAN MYOVIEW AND ECHO TO BE FAXED.   ASYMPTOMATIC  . Heart murmur MILD-- ASYMPTOMATIC  . Hyperlipidemia   . Hypertension   . OA (osteoarthritis) of knee left  . OSA (obstructive sleep apnea) NON-TOLERANT CPAP   no cpap  . Sleep apnea   . Tinnitus of both ears     Past Surgical History:  Procedure Laterality Date  . [place]  . DOPPLER ECHOCARDIOGRAPHY  10/18/2011   EF >55 %  . NM MYOCAR PERF WALL MOTION  10/13/2011   EF 49 %,low risk study  . TONSILLECTOMY  1965  . torn meniscus left knee surgery     . TOTAL HIP ARTHROPLASTY  06-18-2009   OA LEFT HIP  . TOTAL HIP  ARTHROPLASTY Right 03/11/2014   Procedure: RIGHT TOTAL HIP ARTHROPLASTY ANTERIOR APPROACH;  Surgeon: [name], MD;  Location: [facility] ORS;  Service: Orthopedics;  Laterality: Right;  . TRANSTHORACIC ECHOCARDIOGRAM  09/11/2012   EF 55 to 60 %,mild aortic valve regurg,mild mitral valve regurg,lf and rt atriums mildly dilated  . VARICOSE VEIN SURGERY Left 1980  . veins removed from left leg  1994    Family History  Problem Relation Age of Onset  . Diabetes Mother   . Cancer Mother   . Heart failure Other   . Diabetes Other   . Cancer Other   . COPD Other   . Colon cancer Neg Hx   . Stomach cancer Neg Hx    Social History:  reports that he has never smoked. He quit smokeless tobacco use about 27 years ago. He reports that he drinks about 0.6 oz of alcohol per week . He reports that he does not use drugs.  Allergies:  Allergies  Allergen Reactions  . Penicillins Other (See Comments)    Unknown reaction     (Not in a hospital admission)  No results found for this or any previous visit (from the past 48 hour(s)). No results found.  Review of Systems  Constitutional: Negative.   HENT: Negative.   Eyes: Negative.   Respiratory: Negative.   Cardiovascular: Negative.   Gastrointestinal:  Negative.   Genitourinary: Negative.   Musculoskeletal: Positive for back pain and joint pain.  Skin: Negative.   Neurological: Positive for sensory change and focal weakness.  Psychiatric/Behavioral: Negative.     There were no vitals taken for this visit. Physical Exam  Constitutional: He is oriented to person, place, and time. He appears well-developed and well-nourished. He appears distressed.  HENT:  Head: Normocephalic.  Eyes: Pupils are equal, round, and reactive to light.  Neck: Normal range of motion.  Cardiovascular: Normal rate.   Respiratory: Effort normal.  GI: Soft.  Musculoskeletal:  Straight leg raise, buttock, thigh and calf on the right; negative on the right left. Tender  medial joint line. Patellofemoral pain with compression. Trace effusion. Knee exam on inspection reveals no evidence of soft tissue swelling, ecchymosis, deformity or erythema. Nontender over the fibular head or the peroneal nerve. Nontender over the quadriceps insertion of the patellar ligament insertion. The range of motion was full. Provocative maneuvers revealed a negative Lachman, negative anterior and posterior drawer and a negative McMurray. No instability was noted with varus and valgus stressing at 0 or 30 degrees. On manual motor test the quadriceps and hamstrings were 5/5. Sensory exam was intact to light touch.  Pain with extension of the lumbar spine. Lumbar spine exam reveals no evidence of soft tissue swelling, deformity or skin ecchymosis. On palpation there is no tenderness of the lumbar spine. No flank pain with percussion. The abdomen is soft and nontender. Nontender over the trochanters. No cellulitis or lymphadenopathy.  Motor is 5/5 including EHL, tibialis anterior, plantar flexion, quadriceps and hamstrings. Patient is normoreflexic. There is no Babinski or clonus. Sensory exam is intact to light touch. Patient has good distal pulses. No DVT. No pain and normal range of motion without instability of the hips, knees and ankles.  Neurological: He is alert and oriented to person, place, and time.  Skin: Skin is warm and dry.  Psychiatric: He has a normal mood and affect.     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 Disc protrusion at 4-5 on the left, refractory.  1. We will proceed with a corticosteroid injection of the knee. After discussing the risks and benefits of an injection, I sterilely prepped the knee with Betadine and alcohol and under strict, sterile conditions injected the patient with 1 mL of Aristospan and 8 mL of 1% Lidocaine solution with reduction of pain. Post-injection instructions were given. 2. We discussed further  delineation of his pathology, specifically flexion- extension CT myelogram and lateral bending to determine whether he has neural compression at 4-5 and at 5-1. I will give him a call with that result. We discussed microlumbar decompression. In terms of his knee, he will follow up with Dr. Theda Sers with his knee if still symptomatic.  I called him with the result of the CT myelogram. He has got moderately large disc herniation, paracentral to the left; severe, left lateral recess stenosis with displacement of the 5 root. He does have some lateral recess stenosis on the right, more so on the left. There is neural foraminal narrowing noted there as well, seen on his MRI.  Again, he had epidurals at 4-5 on the right which gave him relief; on the left, that did not. I suspect this is a tethering type of pathology that is affecting the contralateral side. As he again has had relief from epidural steroid injections, he has L4-L5 radicular pain and it is reasonable to proceed with a decompression.  I did discuss with him given the duration of the symptoms, I would suggest a bilateral decompression to decompress the 5 root on the left and also on the right. He may have a venous plexus. He does have neural foraminal narrowing on the right, stenosis on the right as well and he probably has a dynamic neural compression of L4 on the right. I discussed this with him in detail including the risks and benefits. There is a portion of the superior articulating process of L4 on the right, that is narrowing the neural foramen. The disc is centrally and off to the right as well as the left. We will proceed accordingly. We discussed the risks and benefits. It is on February 14th.  Any changes in the symptoms in the interim, he is to call.  Plan microlumbar decompression L4-5  Cecilie Kicks., PA-C for Dr. Tonita Cong 02/04/2016, 11:18 AM

## 2016-02-24 NOTE — Transfer of Care (Cosign Needed)
Immediate Anesthesia Transfer of Care Note  Patient: Travis Myers  Procedure(s) Performed: Procedure(s): Bilateral Decompression L4-5, Discectomy L4-5  (N/A)  Patient Location: PACU  Anesthesia Type:General  Level of Consciousness: sedated  Airway & Oxygen Therapy: Patient Spontanous Breathing and Patient connected to face mask oxygen  Post-op Assessment: Report given to RN  Post vital signs: stable  Last Vitals:  Vitals:   02/24/16 0627  BP: (!) 160/86  Pulse: 66  Resp: 18  Temp: 37.1 C    Last Pain:  Vitals:   02/24/16 0627  TempSrc: Oral         Complications: No apparent anesthesia complications

## 2016-02-24 NOTE — Interval H&P Note (Signed)
History and Physical Interval Note:  02/24/2016 7:45 AM  Travis Myers  has presented today for surgery, with the diagnosis of Stenosis L4-5  The various methods of treatment have been discussed with the patient and family. After consideration of risks, benefits and other options for treatment, the patient has consented to  Procedure(s): Microlumbar Decompression L4-5  (N/A) as a surgical intervention .  The patient's history has been reviewed, patient examined, no change in status, stable for surgery.  I have reviewed the patient's chart and labs.  Questions were answered to the patient's satisfaction.     Camy Leder C

## 2016-02-24 NOTE — Anesthesia Postprocedure Evaluation (Signed)
Anesthesia Post Note  Patient: Travis Myers  Procedure(s) Performed: Procedure(s) (LRB): Bilateral Decompression L4-5, Discectomy L4-5  (N/A)  Patient location during evaluation: PACU Anesthesia Type: General Level of consciousness: awake Pain management: pain level controlled Vital Signs Assessment: post-procedure vital signs reviewed and stable Cardiovascular status: stable Anesthetic complications: no       Last Vitals:  Vitals:   02/24/16 1139 02/24/16 1239  BP: (!) 146/77 138/72  Pulse: (!) 56 62  Resp: 14 14  Temp: 36.8 C 36.5 C    Last Pain:  Vitals:   02/24/16 1244  TempSrc:   PainSc: 3                  Rayshawn Maney

## 2016-02-24 NOTE — Brief Op Note (Signed)
02/24/2016  10:14 AM  PATIENT:  Travis Myers  61 y.o. male  PRE-OPERATIVE DIAGNOSIS:  Stenosis L4-5  POST-OPERATIVE DIAGNOSIS:  Stenosis L4-5  PROCEDURE:  Procedure(s): Bilateral Decompression L4-5, Discectomy L4-5  (N/A)  SURGEON:  Surgeon(s) and Role:    * Susa Day, MD - Primary  PHYSICIAN ASSISTANT:   ASSISTANTS: [place]   ANESTHESIA:   general  EBL:  Total I/O In: 1000 [I.V.:1000] Out: -   BLOOD ADMINISTERED:none  DRAINS: none   LOCAL MEDICATIONS USED:  MARCAINE     SPECIMEN:  Source of Specimen:  [id number]  DISPOSITION OF SPECIMEN:  PATHOLOGY  COUNTS:  YES  TOURNIQUET:  * No tourniquets in log *  DICTATION: .Other Dictation: Dictation Number  [id number]  PLAN OF CARE: Admit for overnight observation  PATIENT DISPOSITION:  PACU - hemodynamically stable.   Delay start of Pharmacological VTE agent (>24hrs) due to surgical blood loss or risk of bleeding: yes

## 2016-02-25 ENCOUNTER — Encounter (HOSPITAL_COMMUNITY): Payer: Self-pay | Admitting: Specialist

## 2016-02-25 DIAGNOSIS — M5116 Intervertebral disc disorders with radiculopathy, lumbar region: Secondary | ICD-10-CM | POA: Diagnosis not present

## 2016-02-25 LAB — BASIC METABOLIC PANEL
Anion gap: 6 (ref 5–15)
BUN: 14 mg/dL (ref 6–20)
CO2: 25 mmol/L (ref 22–32)
Calcium: 8.8 mg/dL — ABNORMAL LOW (ref 8.9–10.3)
Chloride: 108 mmol/L (ref 101–111)
Creatinine, Ser: 0.9 mg/dL (ref 0.61–1.24)
GFR calc Af Amer: >60 mL/min (ref >60)
GFR calc non Af Amer: >60 mL/min (ref >60)
Glucose, Bld: 145 mg/dL — ABNORMAL HIGH (ref 65–99)
Potassium: 4.3 mmol/L (ref 3.5–5.1)
Sodium: 139 mmol/L (ref 135–145)

## 2016-02-25 MED ORDER — ASPIRIN EC 81 MG PO TBEC: 81.0000 mg | DELAYED_RELEASE_TABLET | Freq: Every day | ORAL | Status: DC

## 2016-02-25 MED ORDER — IBUPROFEN 800 MG PO TABS: 800.0000 mg | ORAL_TABLET | Freq: Three times a day (TID) | ORAL | 0 refills | Status: DC

## 2016-02-25 NOTE — Care Management Note (Signed)
Case Management Note  Patient Details  Name: Travis Myers MRN: [id number] Date of Birth: July 03, 1955  Subjective/Objective:                  Acute meniscal tear of knee left   Aneurysm, ascending aorta [facility])    Action/Plan: Discharge planning Expected Discharge Date:  02/25/16               Expected Discharge Plan:  Home/Self Care  In-House Referral:     Discharge planning Services  CM Consult  Post Acute Care Choice:  NA Choice offered to:  NA  DME Arranged:  N/A DME Agency:  NA  HH Arranged:  NA HH Agency:     Status of Service:  Completed, signed off  If discussed at [place] of Stay Meetings, dates discussed:    Additional Comments: CM notes no HH services recc or ordered nor DME needs recc or ordered.  No other CM needs were communicated. Dellie Catholic, RN 02/25/2016, 10:24 AM

## 2016-02-25 NOTE — Discharge Summary (Signed)
Physician Discharge Summary   Patient ID: Travis Myers MRN: [id number] DOB/AGE: May 11, 1955 61 y.o.  Admit date: 02/24/2016 Discharge date: 02/25/2016  Primary Diagnosis:   Stenosis L4-5  Admission Diagnoses:  Past Medical History:  Diagnosis Date  . Acute meniscal tear of knee left  . Aneurysm, ascending aorta (HCC) 4.8cm per ct 2011/ CARDIOLOGSIT--  DR Rollene Fare  LOV  10-07-2011  NOTE W/ CHART AND REQUEST LEXISCAN MYOVIEW AND ECHO TO BE FAXED.   ASYMPTOMATIC  . Heart murmur MILD-- ASYMPTOMATIC  . Hyperlipidemia   . Hypertension   . OA (osteoarthritis) of knee left  . OSA (obstructive sleep apnea) NON-TOLERANT CPAP   no cpap  . Pneumonia   . Pre-diabetes   . Sleep apnea   . Tinnitus of both ears    Discharge Diagnoses:   Principal Problem:   Spinal stenosis of lumbar region Active Problems:   HNP (herniated nucleus pulposus), lumbar  Procedure:  Procedure(s) (LRB): Bilateral Decompression L4-5, Discectomy L4-5  (N/A)   Consults: None  HPI:  see H&P    Laboratory Data: Hospital Outpatient Visit on 02/18/2016  Component Date Value Ref Range Status  . Sodium 02/18/2016 139  135 - 145 mmol/L Final  . Potassium 02/18/2016 4.7  3.5 - 5.1 mmol/L Final  . Chloride 02/18/2016 103  101 - 111 mmol/L Final  . CO2 02/18/2016 30  22 - 32 mmol/L Final  . Glucose, Bld 02/18/2016 111* 65 - 99 mg/dL Final  . BUN 02/18/2016 25* 6 - 20 mg/dL Final  . Creatinine, Ser 02/18/2016 0.98  0.61 - 1.24 mg/dL Final  . Calcium 02/18/2016 9.0  8.9 - 10.3 mg/dL Final  . GFR calc non Af Amer 02/18/2016 >60  >60 mL/min Final  . GFR calc Af Amer 02/18/2016 >60  >60 mL/min Final   Comment: (NOTE) The eGFR has been calculated using the CKD EPI equation. This calculation has not been validated in all clinical situations. eGFR's persistently <60 mL/min signify possible Chronic Kidney Disease.   . Anion gap 02/18/2016 6  5 - 15 Final  . WBC 02/18/2016 5.3  4.0 - 10.5 K/uL Final  .  RBC 02/18/2016 4.30  4.22 - 5.81 MIL/uL Final  . Hemoglobin 02/18/2016 13.5  13.0 - 17.0 g/dL Final  . HCT 02/18/2016 39.4  39.0 - 52.0 % Final  . MCV 02/18/2016 91.6  78.0 - 100.0 fL Final  . MCH 02/18/2016 31.4  26.0 - 34.0 pg Final  . MCHC 02/18/2016 34.3  30.0 - 36.0 g/dL Final  . RDW 02/18/2016 13.3  11.5 - 15.5 % Final  . Platelets 02/18/2016 199  150 - 400 K/uL Final  . MRSA, PCR 02/18/2016 NEGATIVE  NEGATIVE Final  . Staphylococcus aureus 02/18/2016 NEGATIVE  NEGATIVE Final   Comment:        The Xpert SA Assay (FDA approved for NASAL specimens in patients over 27 years of age), is one component of a comprehensive surveillance program.  Test performance has been validated by [facility] for patients greater than or equal to 91 year old. It is not intended to diagnose infection nor to guide or monitor treatment.   . Hgb A1c MFr Bld 02/18/2016 5.6  4.8 - 5.6 % Final   Comment: (NOTE)         Pre-diabetes: 5.7 - 6.4         Diabetes: >6.4         Glycemic control for adults with diabetes: <7.0   . Mean  Plasma Glucose 02/18/2016 114  mg/dL Final   Comment: (NOTE) Performed At: [facility] [place], [place] [id number] Lindon Romp MD LK:[id number]    No results for input(s): HGB in the last 72 hours. No results for input(s): WBC, RBC, HCT, PLT in the last 72 hours.  Recent Labs  02/25/16 0432  NA 139  K 4.3  CL 108  CO2 25  BUN 14  CREATININE 0.90  GLUCOSE 145*  CALCIUM 8.8*   No results for input(s): LABPT, INR in the last 72 hours.  X-Rays:Dg Chest 2 View  Result Date: 02/18/2016 CLINICAL DATA:  61 year old male preoperative study for lumbar surgery. Initial encounter. EXAM: CHEST  2 VIEW COMPARISON:  Chest CTA 04/23/2015 and earlier. FINDINGS: Stable cardiac size and mediastinal contours. Chronic tortuosity of the thoracic aorta and stable cardiomegaly. Stable lung volumes. Visualized tracheal air column is within normal limits. No  pneumothorax, pulmonary edema, pleural effusion or confluent pulmonary opacity. Mild eventration of the diaphragm (normal variant). No acute osseous abnormality identified. Negative visible bowel gas pattern. IMPRESSION: Stable cardiomegaly and tortuosity of the thoracic aorta. No acute cardiopulmonary abnormality. Electronically Signed   By: Genevie Ann M.D.   On: 02/18/2016 09:28   Dg Lumbar Spine 2-3 Views  Result Date: 02/18/2016 CLINICAL DATA:  61 year old male preoperative study for lumbar surgery. Initial encounter. EXAM: LUMBAR SPINE - 2-3 VIEW COMPARISON:  [facility] CT lumbar myelogram 01/28/2016. FINDINGS: Normal lumbar segmentation as demonstrated on the comparison. Stable lumbar vertebral height and alignment. Disc space loss at L4-L5 again noted and there was vacuum disc evident on the comparison CT myelogram. Lower lumbar facet hypertrophy. Lower thoracic and upper lumbar anterior endplate osteophytosis again noted. Partially visible bilateral hip arthroplasty. Negative SI joints. IMPRESSION: No acute osseous abnormality. Normal lumbar segmentation as demonstrated on the comparison CT myelogram. The lumbar levels on these images were numbered in anticipation of surgery. Electronically Signed   By: Genevie Ann M.D.   On: 02/18/2016 09:30   Ct Lumbar Spine W Contrast  Result Date: 01/28/2016 CLINICAL DATA:  Right-sided low back pain radiating into the right lower extremity, predominantly anteriorly and medially near the knee. EXAM: LUMBAR MYELOGRAM FLUOROSCOPY TIME:  Radiation Exposure Index (as provided by the fluoroscopic device): 430.84 microGray*m^2 Fluoroscopy Time (in minutes and seconds):  35 seconds PROCEDURE: After thorough discussion of risks and benefits of the procedure including bleeding, infection, injury to nerves, blood vessels, adjacent structures as well as headache and CSF leak, written and oral informed consent was obtained. Consent was obtained by Dr. Logan Bores. Time out  form was completed. Patient was positioned prone on the fluoroscopy table. Local anesthesia was provided with 1% lidocaine without epinephrine after prepped and draped in the usual sterile fashion. Puncture was performed at L3-4 using a 5 inch 22-gauge spinal needle via a left interlaminar approach. Using a single pass through the dura, the needle was placed within the thecal sac, with return of clear CSF. 15 mL of Isovue M 200 was injected into the thecal sac, with normal opacification of the nerve roots and cauda equina consistent with free flow within the subarachnoid space. I personally performed the lumbar puncture and administered the intrathecal contrast. I also personally supervised acquisition of the myelogram images. TECHNIQUE: Contiguous axial images were obtained through the Lumbar spine after the intrathecal infusion of infusion. Coronal and sagittal reconstructions were obtained of the axial image sets. COMPARISON:  None FINDINGS: LUMBAR MYELOGRAM FINDINGS: Lumbar segmentation is normal.  There is trace retrolisthesis of L1 on L2 which is not significantly change with flexion or extension. A moderate-sized ventral extradural defect is present at L4-5 without high-grade spinal stenosis. However, there is evidence of left lateral recess stenosis with partial effacement of the left L5 nerve root sleeve. This improves with upright rightward bending. There is evidence of subtle right lateral recess narrowing at L2-3. CT LUMBAR MYELOGRAM FINDINGS: There is trace degenerative retrolisthesis of L1 on L2. Vertebral body heights are preserved without evidence of fracture. No suspicious osseous lesion is seen. The conus medullaris terminates at the lower L1 level. Mild aortoiliac atherosclerosis is noted. T12-L1: Predominantly anterior spondylosis. No disc herniation or stenosis. L1-2: Mildly limited assessment due to motion artifact through this level. Mild disc space narrowing. Slight retrolisthesis, mild disc  bulging, and mild facet and ligamentum flavum hypertrophy without significant stenosis. L2-3: Moderate facet and ligamentum flavum hypertrophy and at most minimal disc bulging result in minimal to mild right lateral recess narrowing without spinal stenosis or neural foraminal stenosis. L3-4: Minimal disc bulging and ligamentum flavum thickening without stenosis. L4-5: Vacuum disc phenomenon. Moderately large left paracentral to subarticular disc protrusion, mild ligamentum flavum thickening, and moderate facet arthrosis result in severe left lateral recess stenosis with left L5 nerve root displacement. Disc bulging contributes to mild-to-moderate right and mild left neural foraminal stenosis. No significant overall spinal stenosis. L5-S1: Minimal disc bulging and moderate facet arthrosis result in minimal left neural foraminal stenosis without spinal stenosis. IMPRESSION: 1. L4-5 disc protrusion with left lateral recess stenosis affecting the left L5 nerve roots. 2. Mild-to-moderate right and mild left neural foraminal stenosis at L4-5. 3. Mild disc and mild-to-moderate facet degeneration elsewhere as above without evidence of neural impingement. Electronically Signed   By: Logan Bores M.D.   On: 01/28/2016 15:12   Dg Spine Portable 1 View  Result Date: 02/24/2016 CLINICAL DATA:  L4-5 lumbar decompression EXAM: PORTABLE SPINE - 1 VIEW COMPARISON:  02/24/2016 FINDINGS: The portable radiograph has been annotated using the same numbering scheme as the previous exam. A surgical probe is identified with tip posterior to the superior endplate of L5. IMPRESSION: Surgical probe localizes the superior endplate of L5. Electronically Signed   By: Kerby Moors M.D.   On: 02/24/2016 10:17   Dg Spine Portable 1 View  Result Date: 02/24/2016 CLINICAL DATA:  L4-5 decompression. EXAM: PORTABLE SPINE - 1 VIEW COMPARISON:  Earlier today at 0845 hours. FINDINGS: 0851 hours. Surgical device projects posterior to the L4-5  interspace. Loss of intervertebral disc height at this level and at L5-S1. IMPRESSION: Intraoperative localization of L4-5. Electronically Signed   By: Abigail Miyamoto M.D.   On: 02/24/2016 09:22   Dg Spine Portable 1 View  Result Date: 02/24/2016 CLINICAL DATA:  Lumbar decompression L4-5 EXAM: PORTABLE SPINE - 1 VIEW COMPARISON:  Lumbar spine series February 18, 2016 FINDINGS: Cross-table lateral lumbar image labeled #1 submitted. Metallic probe tips are posterior to the L3-4 and L4-5 levels. There is mild disc space narrowing at L4-5. No fracture or spondylolisthesis. IMPRESSION: Metallic probe tips are posterior to the L3-4 and L4-5 levels. Disc space narrowing noted at L4-5. Electronically Signed   By: Lowella Grip III M.D.   On: 02/24/2016 09:19   Dg Myelography Lumbar Inj Lumbosacral  Result Date: 01/28/2016 CLINICAL DATA:  Right-sided low back pain radiating into the right lower extremity, predominantly anteriorly and medially near the knee. EXAM: LUMBAR MYELOGRAM FLUOROSCOPY TIME:  Radiation Exposure Index (as provided by the  fluoroscopic device): 430.84 microGray*m^2 Fluoroscopy Time (in minutes and seconds):  35 seconds PROCEDURE: After thorough discussion of risks and benefits of the procedure including bleeding, infection, injury to nerves, blood vessels, adjacent structures as well as headache and CSF leak, written and oral informed consent was obtained. Consent was obtained by Dr. Logan Bores. Time out form was completed. Patient was positioned prone on the fluoroscopy table. Local anesthesia was provided with 1% lidocaine without epinephrine after prepped and draped in the usual sterile fashion. Puncture was performed at L3-4 using a 5 inch 22-gauge spinal needle via a left interlaminar approach. Using a single pass through the dura, the needle was placed within the thecal sac, with return of clear CSF. 15 mL of Isovue M 200 was injected into the thecal sac, with normal opacification of the  nerve roots and cauda equina consistent with free flow within the subarachnoid space. I personally performed the lumbar puncture and administered the intrathecal contrast. I also personally supervised acquisition of the myelogram images. TECHNIQUE: Contiguous axial images were obtained through the Lumbar spine after the intrathecal infusion of infusion. Coronal and sagittal reconstructions were obtained of the axial image sets. COMPARISON:  None FINDINGS: LUMBAR MYELOGRAM FINDINGS: Lumbar segmentation is normal. There is trace retrolisthesis of L1 on L2 which is not significantly change with flexion or extension. A moderate-sized ventral extradural defect is present at L4-5 without high-grade spinal stenosis. However, there is evidence of left lateral recess stenosis with partial effacement of the left L5 nerve root sleeve. This improves with upright rightward bending. There is evidence of subtle right lateral recess narrowing at L2-3. CT LUMBAR MYELOGRAM FINDINGS: There is trace degenerative retrolisthesis of L1 on L2. Vertebral body heights are preserved without evidence of fracture. No suspicious osseous lesion is seen. The conus medullaris terminates at the lower L1 level. Mild aortoiliac atherosclerosis is noted. T12-L1: Predominantly anterior spondylosis. No disc herniation or stenosis. L1-2: Mildly limited assessment due to motion artifact through this level. Mild disc space narrowing. Slight retrolisthesis, mild disc bulging, and mild facet and ligamentum flavum hypertrophy without significant stenosis. L2-3: Moderate facet and ligamentum flavum hypertrophy and at most minimal disc bulging result in minimal to mild right lateral recess narrowing without spinal stenosis or neural foraminal stenosis. L3-4: Minimal disc bulging and ligamentum flavum thickening without stenosis. L4-5: Vacuum disc phenomenon. Moderately large left paracentral to subarticular disc protrusion, mild ligamentum flavum thickening, and  moderate facet arthrosis result in severe left lateral recess stenosis with left L5 nerve root displacement. Disc bulging contributes to mild-to-moderate right and mild left neural foraminal stenosis. No significant overall spinal stenosis. L5-S1: Minimal disc bulging and moderate facet arthrosis result in minimal left neural foraminal stenosis without spinal stenosis. IMPRESSION: 1. L4-5 disc protrusion with left lateral recess stenosis affecting the left L5 nerve roots. 2. Mild-to-moderate right and mild left neural foraminal stenosis at L4-5. 3. Mild disc and mild-to-moderate facet degeneration elsewhere as above without evidence of neural impingement. Electronically Signed   By: Logan Bores M.D.   On: 01/28/2016 15:12    EKG: Orders placed or performed in visit on 06/30/15  . EKG 12-Lead     Hospital Course: Patient was admitted to [facility] and taken to the OR and underwent the above state procedure without complications.  Patient tolerated the procedure well and was later transferred to the recovery room and then to the orthopaedic floor for postoperative care.  They were given PO and IV analgesics for pain control following  their surgery.  They were given 24 hours of postoperative antibiotics.   PT was consulted postop to assist with mobility and transfers.  The patient was allowed to be WBAT with therapy and was taught back precautions. Discharge planning was consulted to help with postop disposition and equipment needs.  Patient had a good night on the evening of surgery and started to get up OOB with therapy on day one. Patient was seen in rounds and was ready to go home on day one.  They were given discharge instructions and dressing directions.  They were instructed on when to follow up in the office with Dr. Tonita Cong.   Diet: Regular diet Activity:WBAT; Lspine precautions Follow-up:in 14 days Disposition - Home Discharged Condition: good   Discharge Instructions    Call MD /  Call 911    Complete by:  As directed    If you experience chest pain or shortness of breath, CALL 911 and be transported to the hospital emergency room.  If you develope a fever above 101 F, pus (white drainage) or increased drainage or redness at the wound, or calf pain, call your surgeon's office.   Constipation Prevention    Complete by:  As directed    Drink plenty of fluids.  Prune juice may be helpful.  You may use a stool softener, such as Colace (over the counter) 100 mg twice a day.  Use MiraLax (over the counter) for constipation as needed.   Diet - low sodium heart healthy    Complete by:  As directed    Increase activity slowly as tolerated    Complete by:  As directed      Allergies as of 02/25/2016      Reactions   Penicillins Other (See Comments)   Childhood reaction.  Has patient had a PCN reaction causing immediate rash, facial/tongue/throat swelling, SOB or lightheadedness with hypotension:unsure Has patient had a PCN reaction causing severe rash involving mucus membranes or skin necrosis:unsure Has patient had a PCN reaction that required hospitalization:unsure Has patient had a PCN reaction occurring within the last 10 years:No If all of the above answers are "NO", then may proceed with Cephalosporin use.      Medication List    STOP taking these medications   atorvastatin 10 MG tablet Commonly known as:  LIPITOR   oseltamivir 75 MG capsule Commonly known as:  TAMIFLU     TAKE these medications   ASPERCREME W/LIDOCAINE 4 % cream Generic drug:  lidocaine Apply 1 application topically 3 (three) times daily as needed (for pain.).   aspirin EC 81 MG tablet Take 1 tablet (81 mg total) by mouth daily. Resume 4 days post-op What changed:  additional instructions   docusate sodium 100 MG capsule Commonly known as:  COLACE Take 1 capsule (100 mg total) by mouth 2 (two) times daily as needed for mild constipation.   EUCERIN ORIGINAL HEALING Lotn Apply 1-2  application topically 4 (four) times daily as needed (for dry skin).   furosemide 20 MG tablet Commonly known as:  LASIX Take 40 mg by mouth daily.   gabapentin 300 MG capsule Commonly known as:  NEURONTIN Take 300 mg by mouth 3 (three) times daily.   ibuprofen 800 MG tablet Commonly known as:  ADVIL,MOTRIN Take 1 tablet (800 mg total) by mouth 3 (three) times daily. Resume 5 days post-op as needed What changed:  additional instructions   lisinopril 40 MG tablet Commonly known as:  PRINIVIL,ZESTRIL TAKE 1 TABLET BY  MOUTH EVERY DAY FOR BLOOD PRESSURE   meclizine 25 MG tablet Commonly known as:  ANTIVERT Take 25 mg by mouth daily.   methocarbamol 500 MG tablet Commonly known as:  ROBAXIN Take 1 tablet (500 mg total) by mouth every 6 (six) hours as needed for muscle spasms.   metoprolol succinate 50 MG 24 hr tablet Commonly known as:  TOPROL-XL Take 50 mg by mouth daily. for high blood pressure   MOISTURE Crea Apply 1-2 application topically 2 (two) times daily as needed (for dry skin). [employer]   oxyCODONE-acetaminophen 10-325 MG tablet Commonly known as:  PERCOCET Take 1-2 tablets by mouth every 4 (four) hours as needed for pain (severe).   polyethylene glycol packet Commonly known as:  MIRALAX / GLYCOLAX Take 17 g by mouth daily.   traMADol-acetaminophen 37.5-325 MG tablet Commonly known as:  ULTRACET Take 1-2 tablets by mouth 3 (three) times daily.   vitamin C 500 MG tablet Commonly known as:  ASCORBIC ACID Take 500 mg by mouth 3 (three) times daily as needed (for immune system health).      Follow-up Information    BEANE,JEFFREY C, MD Follow up in 2 week(s).   Specialty:  Orthopedic Surgery Contact information: [address] [place] [address] [phone]           Signed: Lacie Draft, PA-C Orthopaedic Surgery 02/25/2016, 9:00 AM

## 2016-02-25 NOTE — Evaluation (Addendum)
Occupational Therapy Evaluation Patient Details Name: Travis Myers MRN: [id number] DOB: 02/13/1955 Today's Date: 02/25/2016    History of Present Illness s/p L4-5 decompression   Clinical Impression   This 61 year old man was admitted for the above sx. All education was completed. No further OT is needed at this time    Follow Up Recommendations  No OT follow up;Supervision - Intermittent    Equipment Recommendations  None recommended by OT    Recommendations for Other Services       Precautions / Restrictions Precautions Precautions: Back Restrictions Weight Bearing Restrictions: No      Mobility Bed Mobility Overal bed mobility: Needs Assistance             General bed mobility comments: in recliner  Transfers Overall transfer level: Needs assistance Equipment used: Rolling walker (2 wheeled) Transfers: Sit to/from Stand Sit to Stand: Supervision         General transfer comment: cues fro back precautions and hand placement    Balance                                            ADL Overall ADL's : Needs assistance/impaired     Grooming: Wash/dry hands;Supervision/safety;Standing   Upper Body Bathing: Set up;Sitting   Lower Body Bathing: Moderate assistance;Sit to/from stand   Upper Body Dressing : Set up;Sitting   Lower Body Dressing: Maximal assistance;Sit to/from stand   Toilet Transfer: Min guard;Ambulation;Grab bars;RW;Comfort height toilet   Toileting- Clothing Manipulation and Hygiene: Moderate assistance;Sit to/from stand         General ADL Comments: educated on AE, adapted techniques for following back precautions.  Pt has a comfort height commode. He was able to sit on this with grab bar and doesn't want DME for toilet at home  Would benefit from toilet aide     Vision     Perception     Praxis      Pertinent Vitals/Pain Pain Assessment: 0-10 Pain Score: 3  Faces Pain Scale: Hurts even  more Pain Location: back, incision, back of right leg Pain Descriptors / Indicators: Sore;Tightness;Discomfort Pain Intervention(s): Premedicated before session;Monitored during session;Repositioned     Hand Dominance     Extremity/Trunk Assessment Upper Extremity Assessment Upper Extremity Assessment: Defer to OT evaluation   Lower Extremity Assessment Lower Extremity Assessment: Overall WFL for tasks assessed   Cervical / Trunk Assessment Cervical / Trunk Assessment: Normal   Communication Communication Communication: No difficulties   Cognition Arousal/Alertness: Awake/alert Behavior During Therapy: WFL for tasks assessed/performed Overall Cognitive Status: Within Functional Limits for tasks assessed                     General Comments       Exercises       Shoulder Instructions      Home Living Family/patient expects to be discharged to:: Private residence Living Arrangements: Spouse/significant other Available Help at Discharge: Family               Bathroom Shower/Tub: Tub/shower unit   Bathroom Toilet: Handicapped height     Home Equipment: Environmental consultant - 2 wheels          Prior Functioning/Environment Level of Independence: Independent with assistive device(s)                 OT Problem List:  OT Treatment/Interventions:      OT Goals(Current goals can be found in the care plan section) Acute Rehab OT Goals Patient Stated Goal: home ASAP OT Goal Formulation: All assessment and education complete, DC therapy  OT Frequency:     Barriers to D/C:            Co-evaluation              End of Session    Activity Tolerance: Patient tolerated treatment well Patient left: in chair;with call bell/phone within reach   Time: [phone] (PA and RN in during session) OT Time Calculation (min): 44 min Charges:  OT General Charges $OT Visit: 1 Procedure OT Evaluation $OT Eval Low Complexity: 1 Procedure OT  Treatments $Self Care/Home Management : 8-22 mins G-Codes: OT G-codes **NOT FOR INPATIENT CLASS** Functional Assessment Tool Used: clinical observation and judgment Functional Limitation: Self care Self Care Current Status [id number]): At least 60 percent but less than 80 percent impaired, limited or restricted Self Care Goal Status [id number]): At least 60 percent but less than 80 percent impaired, limited or restricted Self Care Discharge Status [phone]): At least 60 percent but less than 80 percent impaired, limited or restricted  [facility] 02/25/2016, 10:15 AM  Lesle Chris, OTR/L [phone] 02/25/2016

## 2016-02-25 NOTE — Op Note (Signed)
NAME:  Travis Myers, Travis Myers NO.:  [id number]  MEDICAL RECORD NO.:  [id number]  LOCATION:                                 FACILITY:  PHYSICIAN:  Susa Day, M.D.         DATE OF BIRTH:  DATE OF PROCEDURE:  02/24/2016 DATE OF DISCHARGE:                              OPERATIVE REPORT   PREOPERATIVE DIAGNOSES: 1. Spinal stenosis, herniated nucleus pulposus, L4-5. 2. Morbid obesity with BMI of 35.  POSTOPERATIVE DIAGNOSES: 1. Spinal stenosis, herniated nucleus pulposus, L4-5. 2. Morbid obesity with BMI of 35.  PROCEDURES PERFORMED: 1. Microlumbar decompression at L4-5 bilaterally with bilateral     hemilaminotomies, L4 and L5 foraminotomies. 2. Microdiskectomy, 4-5, left.  ANESTHESIA:  General.  ASSISTANT:  Cleophas Dunker, PA.  HISTORY:  A 61 year old male with predominantly right lower extremity radicular pain at L4, nerve root distribution, temporary relief from selective nerve root block at L4 on the right.  Had L4 pain, but a CT myelogram and MRI indicating a central-paracentral disk herniation to the left without significant left-sided symptoms.  It was felt to have a tethering mechanism to the contralateral side as well as neural foraminal stenosis at 4-5 on the right.  It was indicated for microlumbar decompression at 4-5.  Risks and benefits were discussed including bleeding, infection, damage to the neurovascular structures, no change in symptoms, worsening symptoms, DVT, PE, anesthetic complications, need for fusion in the future, requirement for bilateral decompression.  TECHNIQUE:  With the patient in supine position, after induction of adequate general anesthesia, 3 g of Kefzol, placed prone on the [place] frame.  All bony prominences were well padded.  Lumbar region was prepped and draped in usual sterile fashion.  Two 18-gauge spinal needles were utilized to localize the L4-5 interspace, confirmed with x- ray.  Incision was made from the  spinous process of 4-5.  Subcutaneous tissue was dissected.  Electrocautery was utilized to achieve hemostasis.  He had ample subcutaneous adipose layer.  1% lidocaine was utilized and injected in perimuscular tissue.  Elevated the muscle from lamina of 4 and 5 bilaterally, placed a long McCullough retractor with insufflation and we had to obtain an additional tray for the extra-long McCullough retractors that were predominantly placed.  Operating microscope draped and brought on the surgical field.  Prior to that, we had a confirmatory radiograph at 4-5.  Began with hemilaminotomy on the right on the caudad edge of 4 with a 2 and a 3-mm Kerrison.  He had a very small interlaminar window and due to his size, elevated BMI and surgical exposure, we felt prudent to remove the interspinous ligament. I then used a straight curette to detach the ligamentum flavum from the cephalad edge of 5 bilaterally.  Hemilaminotomies of the caudad edge of 4 were performed bilaterally and detached the ligamentum flavum. Attention turned towards first to the right.  There were significant adhesions of the ligamentum flavum to the thecal sac and meticulously developed a plane between the two.  I removed the ligamentum flavum from the interspace.  There were some adhesions in the noted corticosteroid injections.  After identifying the lateral aspect of the thecal sac and protecting the 5  root, I performed generous foraminotomy of 5, mobilized the thecal sac medially.  We performed a foraminotomy of 4 protecting the 4 root, there was a very large epidural venous vein that was extended across the disk space out into the foramen of 4, that appeared to be with congestion and part of the pathology as was the spur from the facet.  Following this, beneath the thecal sac, I felt that there was a central protrusion and some tethering of the entire thecal sac from the contralateral side.  I therefore went to the  contralateral side, decompressed the lateral recess to the medial border of the pedicle, protected the 5 root medially, protected the 5 root, gently mobilized the thecal sac.  There was a very large disk herniation there, extruded into the center port and epidural venous plexus was noted.  Performed foraminotomies of 4.  There was stenosis here as well as 5.  After performing the foraminotomies, there was additional mobility of the thecal sac, which was adhesed.  It was able then to perform an annulotomy and removed disk material, there was extruded fragment as well centrally and to the left.  Multiple fragments were retrieved and sent to Pathology, irrigated the disk space and then retrieved additional fragments.  This mobilized the thecal sac from both sides.  I felt this was responsible for contralateral tethering etiology as well. Bipolar electrocautery was utilized to achieve hemostasis as well as thrombin-soaked Gelfoam and bone wax.  I obtained the confirmatory radiograph in the disk space.  Following this, neural probe passed freely at the foramen of 4 and 5 bilaterally and cephalad above the pedicle of 4, checked the contralateral side on the right.  No disk herniation, extruded to the right.  Good restoration of thecal sac, no active bleeding was noted.  A 1 cm of excursion of the 5 root medial to pedicle without tension.  Copiously irrigated the wound.  Thrombin- soaked Gelfoam placed in laminotomy defect.  We removed the [facility] retractor, copiously irrigated the wound.  I closed the dorsolumbar fascia with 1 Vicryl, subcu with multiple layers of 2-0 due to his adipose tissue and the skin with staples.  Sterile dressing applied. Placed supine on the hospital bed, extubated without difficulty, and transported to the recovery room in satisfactory condition.  The patient tolerated the procedure well.  No complications.  Assistant, Cleophas Dunker, [place].  Minimal blood loss.   Technical difficulty increased due to the patient's elevated BMI, which required additional layers of suturing, exposure challenges and extra-large retractor set.     Susa Day, M.D.   ______________________________ Susa Day, M.D.    Geralynn Rile  D:  02/24/2016  T:  02/25/2016  Job:  [id number]

## 2016-02-25 NOTE — Progress Notes (Signed)
Subjective: 1 Day Post-Op Procedure(s) (LRB): Bilateral Decompression L4-5, Discectomy L4-5  (N/A) Patient reports pain as moderate.  Reports some incisional pain. Leg pain improved. No other c/o. Many questions this morning about the procedure itself and post-op protocols. OT in room with him this AM already.   Objective: Vital signs in last 24 hours: Temp:  [97.2 F (36.2 C)-98.3 F (36.8 C)] 97.8 F (36.6 C) (02/15 0507) Pulse Rate:  [55-71] 55 (02/15 0507) Resp:  [12-16] 16 (02/15 0507) BP: (131-156)/(69-93) 138/75 (02/15 0507) SpO2:  [96 %-100 %] 98 % (02/15 0507) Weight:  [143.8 kg (317 lb)] 143.8 kg (317 lb) (02/14 1145)  Intake/Output from previous day: 02/14 0701 - 02/15 0700 In: 3871.3 [P.O.:1320; I.V.:2396.3; IV Piggyback:155] Out: 2575 [Urine:2475; Blood:100] Intake/Output this shift: No intake/output data recorded.  No results for input(s): HGB in the last 72 hours. No results for input(s): WBC, RBC, HCT, PLT in the last 72 hours.  Recent Labs  02/25/16 0432  NA 139  K 4.3  CL 108  CO2 25  BUN 14  CREATININE 0.90  GLUCOSE 145*  CALCIUM 8.8*   No results for input(s): LABPT, INR in the last 72 hours.  Neurologically intact ABD soft Neurovascular intact Sensation intact distally Intact pulses distally Dorsiflexion/Plantar flexion intact Incision: dressing C/D/I and no drainage No cellulitis present Compartment soft no sign of DVT  Assessment/Plan: 1 Day Post-Op Procedure(s) (LRB): Bilateral Decompression L4-5, Discectomy L4-5  (N/A) Advance diet Up with therapy D/C IV fluids  Discussed dressing instructions, Lspine precautions, procedure and D/C instructions Plan D/C home later today Discussed with Dr. Tonita Cong Follow up outpt 2 weeks for staple removal  BISSELL, JACLYN M. 02/25/2016, 8:56 AM

## 2016-02-25 NOTE — Evaluation (Addendum)
Physical Therapy Evaluation Patient Details Name: Travis Myers MRN: [id number] DOB: 1955/02/13 Today's Date: 02/25/2016   History of Present Illness  s/p L4-5 decompression  Clinical Impression  The patient tolerated ambulation well.  Ready for DC.    Follow Up Recommendations No PT follow up    Equipment Recommendations  None recommended by PT    Recommendations for Other Services       Precautions / Restrictions Precautions Precautions: Back Restrictions Weight Bearing Restrictions: No      Mobility  Bed Mobility Overal bed mobility:              General bed mobility comments: in recliner  Transfers Overall transfer level: Needs assistance Equipment used: Rolling walker (2 wheeled) Transfers: Sit to/from Stand Sit to Stand: Supervision         General transfer comment: cues fro back precautions and hand placement  Ambulation/Gait Ambulation/Gait assistance: Supervision Ambulation Distance (Feet): 300 Feet Assistive device: Rolling walker (2 wheeled) Gait Pattern/deviations: Step-through pattern     General Gait Details: gait steady and smoothe  Stairs            Wheelchair Mobility    Modified Rankin (Stroke Patients Only)       Balance                                             Pertinent Vitals/Pain Pain Assessment: 0-10 Pain Score: 3  Faces Pain Scale: Hurts even more Pain Location: back, incision, back of right leg Pain Descriptors / Indicators: Sore;Tightness;Discomfort Pain Intervention(s): Premedicated before session;Monitored during session;Repositioned    Home Living Family/patient expects to be discharged to:: Private residence Living Arrangements: Spouse/significant other Available Help at Discharge: Family           Home Equipment: Gilford Rile - 2 wheels      Prior Function Level of Independence: Independent with assistive device(s)               Hand Dominance         Extremity/Trunk Assessment   Upper Extremity Assessment Upper Extremity Assessment: Defer to OT evaluation    Lower Extremity Assessment Lower Extremity Assessment: Overall WFL for tasks assessed    Cervical / Trunk Assessment Cervical / Trunk Assessment: Normal  Communication   Communication: No difficulties  Cognition Arousal/Alertness: Awake/alert Behavior During Therapy: WFL for tasks assessed/performed Overall Cognitive Status: Within Functional Limits for tasks assessed                      General Comments      Exercises     Assessment/Plan    PT Assessment Patent does not need any further PT services  PT Problem List            PT Treatment Interventions      PT Goals (Current goals can be found in the Care Plan section)  Acute Rehab PT Goals Patient Stated Goal: home ASAP PT Goal Formulation: All assessment and education complete, DC therapy    Frequency     Barriers to discharge        Co-evaluation               End of Session   Activity Tolerance: Patient tolerated treatment well Patient left: in chair Nurse Communication: Mobility status    Functional Assessment Tool Used: clinical judgement  Functional Limitation: Mobility: Walking and moving around Mobility: Walking and Moving Around Current Status [phone]): At least 1 percent but less than 20 percent impaired, limited or restricted Mobility: Walking and Moving Around Goal Status [phone]): At least 1 percent but less than 20 percent impaired, limited or restricted Mobility: Walking and Moving Around Discharge Status [phone]): At least 1 percent but less than 20 percent impaired, limited or restricted    Time: 0911-0929 PT Time Calculation (min) (ACUTE ONLY): 18 min   Charges:  Low evaluation       PT G Codes:   PT G-Codes **NOT FOR INPATIENT CLASS** Functional Assessment Tool Used: clinical judgement Functional Limitation: Mobility: Walking and moving around Mobility:  Walking and Moving Around Current Status [id number]): At least 1 percent but less than 20 percent impaired, limited or restricted Mobility: Walking and Moving Around Goal Status [phone]): At least 1 percent but less than 20 percent impaired, limited or restricted Mobility: Walking and Moving Around Discharge Status [phone]): At least 1 percent but less than 20 percent impaired, limited or restricted    Claretha Cooper 02/25/2016, 9:33 AM Tresa Endo PT [phone]

## 2016-03-18 ENCOUNTER — Other Ambulatory Visit: Payer: Self-pay | Admitting: Cardiothoracic Surgery

## 2016-03-18 DIAGNOSIS — I712 Thoracic aortic aneurysm, without rupture, unspecified: Secondary | ICD-10-CM

## 2016-03-23 ENCOUNTER — Other Ambulatory Visit: Payer: Self-pay | Admitting: Cardiothoracic Surgery

## 2016-03-23 DIAGNOSIS — I359 Nonrheumatic aortic valve disorder, unspecified: Secondary | ICD-10-CM

## 2016-04-14 ENCOUNTER — Ambulatory Visit (INDEPENDENT_AMBULATORY_CARE_PROVIDER_SITE_OTHER): Payer: BC Managed Care – PPO | Admitting: Cardiothoracic Surgery

## 2016-04-14 ENCOUNTER — Encounter: Payer: Self-pay | Admitting: Cardiothoracic Surgery

## 2016-04-14 ENCOUNTER — Ambulatory Visit
Admission: RE | Admit: 2016-04-14 | Discharge: 2016-04-14 | Disposition: A | Discharge status: Home / Self Care | Payer: BC Managed Care – PPO | Source: Ambulatory Visit | Attending: Cardiothoracic Surgery | Admitting: Cardiothoracic Surgery

## 2016-04-14 ENCOUNTER — Ambulatory Visit (HOSPITAL_COMMUNITY)
Admission: RE | Admit: 2016-04-14 | Discharge: 2016-04-14 | Disposition: A | Discharge status: Home / Self Care | Payer: BC Managed Care – PPO | Source: Ambulatory Visit | Attending: Cardiothoracic Surgery | Admitting: Cardiothoracic Surgery

## 2016-04-14 VITALS — BP 144/83 | HR 72 | Resp 16 | Ht 76.0 in | Wt 300.0 lb

## 2016-04-14 DIAGNOSIS — I712 Thoracic aortic aneurysm, without rupture, unspecified: Secondary | ICD-10-CM

## 2016-04-14 DIAGNOSIS — I350 Nonrheumatic aortic (valve) stenosis: Secondary | ICD-10-CM | POA: Diagnosis not present

## 2016-04-14 DIAGNOSIS — I351 Nonrheumatic aortic (valve) insufficiency: Secondary | ICD-10-CM | POA: Diagnosis not present

## 2016-04-14 DIAGNOSIS — I359 Nonrheumatic aortic valve disorder, unspecified: Secondary | ICD-10-CM | POA: Diagnosis not present

## 2016-04-14 DIAGNOSIS — I517 Cardiomegaly: Secondary | ICD-10-CM | POA: Diagnosis not present

## 2016-04-14 MED ORDER — IOPAMIDOL (ISOVUE-370) INJECTION 76%
75.0000 mL | Freq: Once | INTRAVENOUS | Status: AC | PRN
  Administered 2016-04-14: 75 mL via INTRAVENOUS

## 2016-04-14 NOTE — Progress Notes (Signed)
[address]       [place] [address]             [phone]                    Travis Myers [place] Medical Record #[id number] Date of Birth: 11-Aug-1955  Referring: [name], MD Primary Care: Cyndi Bender, PA-C  Chief Complaint:    Chief Complaint  Patient presents with  . Follow-up    1 yr with CTA CHEST and ECHO to assess TAA and AI    History of Present Illness:    Travis Myers 61 y.o. male is seen in the office at the request of Dr. Gwenlyn Found for dilated ascending aorta first noted in 2011.  First seen by me in oct 2016. The dilated aorta was first noted after abnormal preop chest x-ray before hip replacement in 2011, subsequent CT scan of the chest to rule out lung mass showed multiple small pulmonary nodules which are been unchanged over the years. His ascending aorta was noted to be dilated to 4.8 cm. Subsequent follow-up CT scans of the chest have been done in 2013 2014 2015 and fall  2016. An echocardiogram was done in 2014 and repeated 04/2015 . The patient has been asymptomatic from a cardiovascular standpoint. He has no evidence of congestive heart failure. He says these had a history of a murmur since childhood. He has no known family history of connective tissue disorder. He had a grandfather on his mother's side died of" heart disease" at age " 55 , But without any other details other than he died several hours after fishing at [employer] and bringing in a large fish. His father died at age 18 with lung cancer and COPD, he did have history of open repair of abdominal aortic aneurysm at 71.    Since last seen the patient has had increasing difficulty with chronic back and right knee pain with surgery on his knee and also on his back in the past year. 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  He notes that he's had increased weight loss since he is unable to be active.  Current Activity/ Functional Status:  Patient is independent with mobility/ambulation, transfers, ADL's, IADL's.   Zubrod Score: At the time of surgery this patient's most appropriate activity status/level should be described as: [x]     0    Normal activity, no symptoms []     1    Restricted in physical strenuous activity but ambulatory, able to do out light work []     2    Ambulatory and capable of self care, unable to do work activities, up and about               >50 % of waking hours                              []     3    Only limited self care, in bed greater than 50% of waking hours []     4    Completely disabled, no self care, confined to bed or chair []     5    Moribund   Past Medical  History:  Diagnosis Date  . Acute meniscal tear of knee left  . Aneurysm, ascending aorta (HCC) 4.8cm per ct 2011/ CARDIOLOGSIT--  DR Rollene Fare  LOV  10-07-2011  NOTE W/ CHART AND REQUEST LEXISCAN MYOVIEW AND ECHO TO BE FAXED.   ASYMPTOMATIC  . Heart murmur MILD-- ASYMPTOMATIC  . Hyperlipidemia   . Hypertension   . OA (osteoarthritis) of knee left  . OSA (obstructive sleep apnea) NON-TOLERANT CPAP   no cpap  . Pneumonia   . Pre-diabetes   . Sleep apnea   . Tinnitus of both ears     Past Surgical History:  Procedure Laterality Date  . [place]  . DOPPLER ECHOCARDIOGRAPHY  10/18/2011   EF >55 %  . LUMBAR LAMINECTOMY/DECOMPRESSION MICRODISCECTOMY N/A 02/24/2016   Procedure: Bilateral Decompression L4-5, Discectomy L4-5 ;  Surgeon: Susa Day, MD;  Location: [facility];  Service: Orthopedics;  Laterality: N/A;  . NM MYOCAR PERF WALL MOTION  10/13/2011   EF 49 %,low risk study  . TONSILLECTOMY  1965  . torn meniscus left knee surgery     . TOTAL HIP ARTHROPLASTY  06-18-2009   OA LEFT HIP  . TOTAL HIP ARTHROPLASTY Right 03/11/2014   Procedure: RIGHT TOTAL HIP  ARTHROPLASTY ANTERIOR APPROACH;  Surgeon: [name], MD;  Location: [facility];  Service: Orthopedics;  Laterality: Right;  . TRANSTHORACIC ECHOCARDIOGRAM  09/11/2012   EF 55 to 60 %,mild aortic valve regurg,mild mitral valve regurg,lf and rt atriums mildly dilated  . VARICOSE VEIN SURGERY Left 1980  . veins removed from left leg  1994    Family History  Problem Relation Age of Onset  . Diabetes Mother   . Cancer Mother   . Heart failure Other   . Diabetes Other   . Cancer Other   . COPD Other   . Colon cancer Neg Hx   . Stomach cancer Neg Hx     Social History   Social History  . Marital status: Married    Spouse name: N/A  . Number of children: N/A  . Years of education: N/A   Occupational History  . Not on file.   Social History Main Topics  . Smoking status: Never Smoker  . Smokeless tobacco: Former Systems developer    Quit date: 09/11/1988  . Alcohol use 0.6 oz/week    1 Standard drinks or equivalent per week     Comment: RARE  . Drug use: No  . Sexual activity: Yes   Other Topics Concern  . Not on file   Social History Narrative  . No narrative on file    History  Smoking Status  . Never Smoker  Smokeless Tobacco  . Former Systems developer  . Quit date: 09/11/1988    History  Alcohol Use  . 0.6 oz/week  . 1 Standard drinks or equivalent per week    Comment: RARE     Allergies  Allergen Reactions  . Penicillins Other (See Comments)    Childhood reaction.  Has patient had a PCN reaction causing immediate rash, facial/tongue/throat swelling, SOB or lightheadedness with hypotension:unsure Has patient had a PCN reaction causing severe rash involving mucus membranes or skin necrosis:unsure Has patient had a PCN reaction that required hospitalization:unsure Has patient had a PCN reaction occurring within the last 10 years:No If all of the above answers are "NO", then may proceed with Cephalosporin use.     Current Outpatient Prescriptions  Medication Sig Dispense Refill    . aspirin  EC 81 MG tablet Take 1 tablet (81 mg total) by mouth daily. Resume 4 days post-op    . ATORVASTATIN CALCIUM PO Take 1 tablet by mouth daily.    . Dermatological Products, Misc. (MOISTURE) CREA Apply 1-2 application topically 2 (two) times daily as needed (for dry skin). [employer] Emergency planning/management officer    . Emollient (EUCERIN ORIGINAL HEALING) LOTN Apply 1-2 application topically 4 (four) times daily as needed (for dry skin).    . furosemide (LASIX) 20 MG tablet Take 40 mg by mouth daily.  5  . gabapentin (NEURONTIN) 300 MG capsule Take 300 mg by mouth 3 (three) times daily.    Marland Kitchen ibuprofen (ADVIL,MOTRIN) 800 MG tablet Take 1 tablet (800 mg total) by mouth 3 (three) times daily. Resume 5 days post-op as needed 30 tablet 0  . lidocaine (ASPERCREME W/LIDOCAINE) 4 % cream Apply 1 application topically 3 (three) times daily as needed (for pain.).    Marland Kitchen lisinopril (PRINIVIL,ZESTRIL) 40 MG tablet TAKE 1 TABLET BY MOUTH EVERY DAY FOR BLOOD PRESSURE  5  . meclizine (ANTIVERT) 25 MG tablet Take 25 mg by mouth daily.  0  . metoprolol succinate (TOPROL-XL) 50 MG 24 hr tablet Take 50 mg by mouth daily. for high blood pressure  5  . traMADol-acetaminophen (ULTRACET) 37.5-325 MG tablet Take 1-2 tablets by mouth 3 (three) times daily.   5   No current facility-administered medications for this visit.     Review of Systems:     Cardiac Review of Systems: Y or N  Chest Pain [ n   ]  Resting SOB [n   ] Exertional SOB  [ n ]  Orthopnea [ n ]   Pedal Edema [  y ]    Palpitations [ n ] Syncope  [ n ]   Presyncope [   n]  General Review of Systems: [Y] = yes [  ]=no Constitional: recent weight change [n  ];  Wt loss over the last 3 months [   ] anorexia [  ]; fatigue [  ]; nausea [  ]; night sweats [  ]; fever [  ]; or chills [  ];          Dental: poor dentition[ n ]; Last Dentist visit:   Eye : blurred vision [  ]; diplopia [   ]; vision changes [  ];  Amaurosis fugax[  ]; Resp: cough [  ];  wheezing[  ];   hemoptysis[n  ]; shortness of breath[n  ]; paroxysmal nocturnal dyspnea[n  ]; dyspnea on exertion[ n ]; or orthopnea[ n ];  GI:  gallstones[  ], vomiting[  ];  dysphagia[  ]; melena[  ];  hematochezia [  ]; heartburn[  ];   Hx of  Colonoscopy[y several wekks ago  ]; GU: kidney stones [  ]; hematuria[n  ];   dysuria [  ];  nocturia[  ];  history of     obstruction [  ]; urinary frequency [  ]             Skin: rash, swelling[  ];, hair loss[  ];  peripheral edema[  ];  or itching[  ]; Musculosketetal: myalgias[  ];  joint swelling[  ];  joint erythema[  ];  joint pain[  ];  back pain[  ];  Heme/Lymph: bruising[  ];  bleeding[  ];  anemia[  ];  Neuro: TIA[  ];  headaches[  ];  stroke[  ];  vertigo[  ];  seizures[ n ];   paresthesias[  ];  difficulty walking[ n ];  Psych:depression[  ]; anxiety[  ];  Endocrine: diabetes[ n ];  thyroid dysfunction[  n];  Immunizations: Flu up to date Blue.Reese  ]; Pneumococcal up to date [ y ];  Other:  Physical Exam: BP (!) 144/83 (BP Location: Right Arm, Patient Position: Sitting, Cuff Size: Large)   Pulse 72   Resp 16   Ht 6\' 4"  (1.93 m)   Wt 300 lb (136.1 kg)   SpO2 95% Comment: ON RA  BMI 36.52 kg/m   PHYSICAL EXAMINATION: General appearance: alert, cooperative, appears stated age and no distress Head: Normocephalic, without obvious abnormality, atraumatic Neck: no adenopathy, no carotid bruit, no JVD, supple, symmetrical, trachea midline and thyroid not enlarged, symmetric, no tenderness/mass/nodules Lymph nodes: Cervical, supraclavicular, and axillary nodes normal. Resp: clear to auscultation bilaterally Back: symmetric, no curvature. ROM normal. No CVA tenderness. Cardio: regular rate and rhythm, S1, S2 normal, no murmur, click, rub or gallop GI: soft, non-tender; bowel sounds normal; no masses,  no organomegaly Extremities: Patient has a healed venous stasis ulcer at the left ankle he noted that it took months healing, no gangrene or trophic changes,  patient has significant bilateral varicose veins noted and venous stasis dermatitis noted Neurologic: Grossly normal Radial brachial femoral DP and PT pulses are present bilaterally     Diagnostic Studies & Laboratory data:     Recent Radiology Findings:  Ct Angio Chest Aorta W/cm &/or Wo/cm  Result Date: 04/14/2016 CLINICAL DATA:  Thoracic aortic aneurysm without rupture. EXAM: CT ANGIOGRAPHY CHEST WITH CONTRAST TECHNIQUE: Multidetector CT imaging of the chest was performed using the standard protocol during bolus administration of intravenous contrast. Multiplanar CT image reconstructions and MIPs were obtained to evaluate the vascular anatomy. CONTRAST:  75 mL of Isovue 370 intravenously. COMPARISON:  CT scan of April 23, 2015. FINDINGS: Cardiovascular: Stable 4.8 cm ascending thoracic aortic aneurysm is noted. Great vessels are widely patent without significant stenosis. Transverse aortic arch measures 2.9 cm. Proximal descending thoracic aorta measures 2.8 cm. Coronary artery calcifications are noted. Normal cardiac size. No pericardial effusion is noted. Mediastinum/Nodes: No enlarged mediastinal, hilar, or axillary lymph nodes. Thyroid gland, trachea, and esophagus demonstrate no significant findings. Lungs/Pleura: Lungs are clear. No pleural effusion or pneumothorax. Upper Abdomen: No acute abnormality. Musculoskeletal: No chest wall abnormality. No acute or significant osseous findings. Review of the MIP images confirms the above findings. IMPRESSION: Stable 4.8 cm ascending thoracic aortic aneurysm. Ascending thoracic aortic aneurysm. Recommend semi-annual imaging followup by CTA or MRA and referral to cardiothoracic surgery if not already obtained. This recommendation follows 2010 ACCF/AHA/AATS/ACR/ASA/SCA/SCAI/SIR/STS/SVM Guidelines for the Diagnosis and Management of Patients With Thoracic Aortic Disease. Circulation. 2010; 121: N829-F621. Coronary artery calcifications suggesting coronary  artery disease. Electronically Signed   By: Marijo Conception, M.D.   On: 04/14/2016 14:04    Ct Angio Chest Aorta W/cm &/or Wo/cm  04/23/2015  CLINICAL DATA:  Thoracic aortic aneurysm without rupture. EXAM: CT ANGIOGRAPHY CHEST WITH CONTRAST TECHNIQUE: Multidetector CT imaging of the chest was performed using the standard protocol during bolus administration of intravenous contrast. Multiplanar CT image reconstructions and MIPs were obtained to evaluate the vascular anatomy. CONTRAST:  75 mL of Isovue 370 intravenously. COMPARISON:  CT scan of October 02, 2014. FINDINGS: No pneumothorax or pleural effusion is noted. No acute pulmonary disease is noted. Coronary artery calcifications are noted. Aortic root measures 3.3 cm in diameter. Ascending thoracic aorta has a maximum  measured diameter 4.8 cm which is not significantly changed compared to prior exam based on my own measurement. Transverse aortic arch measures 2.9 cm in diameter. Descending thoracic aorta measures 2.6 cm in diameter. No dissection is noted. No mediastinal mass or adenopathy is noted. Visualized portion of upper abdomen is unremarkable. No significant osseous abnormality is noted. Review of the MIP images confirms the above findings. IMPRESSION: Coronary artery calcifications suggesting coronary artery disease. 4.8 cm ascending thoracic aortic aneurysm is noted. This is not significantly changed compared to prior exam. Recommend semi-annual imaging followup by CTA or MRA and referral to cardiothoracic surgery if not already obtained. This recommendation follows 2010 ACCF/AHA/AATS/ACR/ASA/SCA/SCAI/SIR/STS/SVM Guidelines for the Diagnosis and Management of Patients With Thoracic Aortic Disease. Circulation. 2010; 121: Y774-J287. Electronically Signed   By: Marijo Conception, M.D.   On: 04/23/2015 14:31  Ct Angio Chest Aorta W/cm &/or Wo/cm  10/02/2014   CLINICAL DATA:  History of thoracic aortic aneurysm 3 years, followup  EXAM: CT ANGIOGRAPHY  CHEST WITH CONTRAST  TECHNIQUE: Multidetector CT imaging of the chest was performed using the standard protocol during bolus administration of intravenous contrast. Multiplanar CT image reconstructions and MIPs were obtained to evaluate the vascular anatomy.  CONTRAST:  75 cc Isovue 370  COMPARISON:  CT angio chest of 10/04/2013  FINDINGS: There is again fusiform dilatation of the ascending aorta noted. Measured in the same planes as measured previously, the mid thoracic ascending aorta measures 4.8 x 5.0 cm compared to 4.8 x 4.9 cm previously. Cardiothoracic surgery consultation recommended due to increased risk of rupture for arch aneurysm ? 5.5 cm. This recommendation follows 2010 ACCF/AHA/AATS/ACR/ASA/SCA/SCAI/SIR/STS/SVM Guidelines for the Diagnosis and Management of Patients With Thoracic Aortic Disease. Circulation. 2010; 121: O676-H209. The mid thoracic aortic arch measures 2.7 cm. The mid descending thoracic aorta measured at the same level as the ascending aorta measures 2.6 cm. The distal thoracic aorta just above the diaphragmatic screw measures 2.7 cm. These measurements appear unchanged.  No mediastinal or hilar adenopathy is seen. The pulmonary arteries are not well opacified cannot and cannot be assessed. Extensive coronary artery calcifications are present primarily involving the left anterior descending artery and circumflex artery. Mild cardiomegaly is stable. 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 Correlation with surgical history is recommended. There are degenerative changes throughout the mid to lower thoracic spine.  On lung window images, no lung parenchymal abnormality is seen. No suspicious lung nodule is seen and there is no evidence of pleural effusion. The central airway is patent.  Review of the MIP images confirms the above findings.  IMPRESSION: 1. The mid ascending thoracic aorta measures 4.8 x 5.0 cm compared to 4.8  x 4.9 cm previously. Cardiothoracic surgery consultation recommended due to increased risk of rupture for arch aneurysm ? 5.5 cm. This recommendation follows 2010 ACCF/AHA/AATS/ACR/ASA/SCA/SCAI/SIR/STS/SVM Guidelines for the Diagnosis and Management of Patients With Thoracic Aortic Disease. Circulation. 2010; 121: O709-G283 . 2. Significant coronary artery calcifications primarily involving the left anterior descending and circumflex coronary arteries.   Electronically Signed   By: Ivar Drape M.D.   On: 10/02/2014 09:17   ECHO 04/13/2016 Echocardiography  Patient:    Mordechai, Matuszak MR #:       [id number] Study Date: 04/14/2016 Gender:     M Age:        3 Height:     193 cm Weight:     143.8 kg BSA:  2.83 m^2 Pt. Status: Room:   ATTENDING    Lanelle Bal MD  ORDERING     Lanelle Bal MD  Virden MD  PERFORMING   [facility], [facility]patient  SONOGRAPHER  Mikki Santee  cc:  ------------------------------------------------------------------- LV EF: 55% -   60%  ------------------------------------------------------------------- Indications:      Aneurysm - thoracic 441.2.  ------------------------------------------------------------------- History:   PMH:  Aneurysm of the Ascending Aorta.  ------------------------------------------------------------------- Study Conclusions  - Left ventricle: The cavity size was normal. Wall thickness was   normal. Systolic function was normal. The estimated ejection   fraction was in the range of 55% to 60%. Wall motion was normal;   there were no regional wall motion abnormalities. Doppler   parameters are consistent with abnormal left ventricular   relaxation (grade 1 diastolic dysfunction). - Aortic valve: There was mild to moderate regurgitation directed   centrally in the LVOT. - Left atrium: The atrium was moderately dilated. - Atrial septum: No defect or patent foramen ovale was  identified.  ------------------------------------------------------------------- Study data:  Comparison was made to the study of 04/20/2015.  Study status:  Routine.  Procedure:  The patient reported no pain pre or post test. Transthoracic echocardiography. Image quality was adequate.  Study completion:  There were no complications. Echocardiography.  M-mode, complete 2D, spectral Doppler, and color Doppler.  Birthdate:  Patient birthdate: June 25, 1955.  Age:  Patient is 61 yr old.  Sex:  Gender: male.    BMI: 38.6 kg/m^2.  Blood pressure:     153/87  Patient status:  Outpatient.  Study date: Study date: 04/14/2016. Study time: 08:54 AM.  Location:  Echo laboratory.  -------------------------------------------------------------------  ------------------------------------------------------------------- Left ventricle:  The cavity size was normal. Wall thickness was normal. Systolic function was normal. The estimated ejection fraction was in the range of 55% to 60%. Wall motion was normal; there were no regional wall motion abnormalities. Doppler parameters are consistent with abnormal left ventricular relaxation (grade 1 diastolic dysfunction).  ------------------------------------------------------------------- Aortic valve:   Structurally normal valve. Trileaflet. Cusp separation was normal.  Doppler:  Transvalvular velocity was within the normal range. There was no stenosis. There was mild to moderate regurgitation directed centrally in the LVOT.  ------------------------------------------------------------------- Aorta:  The aorta was poorly visualized. Ascending aorta: The ascending aorta was moderately dilated.  ------------------------------------------------------------------- Mitral valve:   Structurally normal valve.   Leaflet separation was normal.  Doppler:  Transvalvular velocity was within the normal range. There was no evidence for stenosis. There was  no regurgitation.    Peak gradient (D): 3 mm Hg.  ------------------------------------------------------------------- Left atrium:  The atrium was moderately dilated.  ------------------------------------------------------------------- Atrial septum:  No defect or patent foramen ovale was identified.   ------------------------------------------------------------------- Right ventricle:  Poorly visualized. The cavity size was normal. Systolic function was normal.  ------------------------------------------------------------------- Pulmonic valve:    Structurally normal valve.   Cusp separation was normal.  Doppler:  Transvalvular velocity was within the normal range. There was no regurgitation.  ------------------------------------------------------------------- Tricuspid valve:   Structurally normal valve.   Leaflet separation was normal.  Doppler:  Transvalvular velocity was within the normal range. There was trivial regurgitation.  ------------------------------------------------------------------- Pulmonary artery:   Systolic pressure was within the normal range.   ------------------------------------------------------------------- Right atrium:  The atrium was normal in size.  ------------------------------------------------------------------- Systemic veins: Inferior vena cava: The vessel was normal in size. The respirophasic diameter changes were in the normal range (= 50%), consistent with normal central venous pressure.  -------------------------------------------------------------------  Post procedure conclusions Ascending Aorta:  - The aorta was poorly visualized.  ------------------------------------------------------------------- Measurements   Left ventricle                          Value        Reference  LV ID, ED, PLAX chordal          (H)    53.5  mm     43 - 52  LV ID, ES, PLAX chordal                 32.1  mm     23 - 38  LV fx shortening,  PLAX chordal          40    %      >=29  LV PW thickness, ED                     11.3  mm     ----------  IVS/LV PW ratio, ED                     1.13         <=1.3  Stroke volume, 2D                       134   ml     ----------  Stroke volume/bsa, 2D                   47    ml/m^2 ----------  LV e&', lateral                          6.64  cm/s   ----------  LV E/e&', lateral                        12.68        ----------  LV e&', medial                           7.4   cm/s   ----------  LV E/e&', medial                         11.38        ----------  LV e&', average                          7.02  cm/s   ----------  LV E/e&', average                        11.99        ----------  Longitudinal strain, TDI                18    %      ----------    Ventricular septum                      Value        Reference  IVS thickness, ED                       12.8  mm     ----------    LVOT  Value        Reference  LVOT ID, S                              23    mm     ----------  LVOT area                               4.15  cm^2   ----------  LVOT peak velocity, S                   124   cm/s   ----------  LVOT mean velocity, S                   82.3  cm/s   ----------  LVOT VTI, S                             32.3  cm     ----------  LVOT peak gradient, S                   6     mm Hg  ----------    Aortic valve                            Value        Reference  Aortic regurg pressure half-time        816   ms     ----------    Aorta                                   Value        Reference  Aortic root ID, ED                      26    mm     ----------  Ascending aorta ID, A-P, mid, ED (H)    47.4  mm     21 - 34    Left atrium                             Value        Reference  LA ID, A-P, ES                          45    mm     ----------  LA ID/bsa, A-P                          1.59  cm/m^2 <=2.2  LA volume, ES, 1-p A4C                  49.8  ml      ----------  LA volume/bsa, ES, 1-p A4C              17.6  ml/m^2 ----------  LA volume, ES, 1-p A2C                  81.6  ml     ----------  LA volume/bsa, ES, 1-p A2C  28.9  ml/m^2 ----------    Mitral valve                            Value        Reference  Mitral E-wave peak velocity             84.2  cm/s   ----------  Mitral A-wave peak velocity             90.6  cm/s   ----------  Mitral deceleration time                215   ms     150 - 230  Mitral peak gradient, D                 3     mm Hg  ----------  Mitral E/A ratio, peak                  0.9          ----------    Pulmonary arteries                      Value        Reference  PA pressure, S, DP                      25    mm Hg  <=30    Tricuspid valve                         Value        Reference  Tricuspid regurg peak velocity          235   cm/s   ----------  Tricuspid peak RV-RA gradient           22    mm Hg  ----------    Right atrium                            Value        Reference  RA ID, S-I, ES, A4C              (H)    67.8  mm     34 - 49  RA area, ES, A4C                 (H)    24.3  cm^2   8.3 - 19.5  RA volume, ES, A/L                      72.8  ml     ----------  RA volume/bsa, ES, A/L                  25.7  ml/m^2 ----------    Systemic veins                          Value        Reference  Estimated CVP                           3     mm Hg  ----------    Right ventricle  Value        Reference  TAPSE                                   22    mm     ----------  RV pressure, S, DP                      25    mm Hg  <=30  RV s&', lateral, S                       12.9  cm/s   ----------  Legend: (L)  and  (H)  mark values outside specified reference range.  ------------------------------------------------------------------- Prepared and Electronically Authenticated by  Sanda Klein, MD 2018-04-05T14:52:39   ECHO  04/20/2015   LV EF: 60% -  65%  ------------------------------------------------------------------- Indications: Aortic stenosis 424.1.  ------------------------------------------------------------------- History: PMH: AAA. Ascending Aortic Aneurysm 51 mm. Risk factors: Hypertension. Dyslipidemia.  ------------------------------------------------------------------- Study Conclusions  - Left ventricle: The cavity size was normal. Wall thickness was  increased in a pattern of mild LVH. Systolic function was normal.  The estimated ejection fraction was in the range of 60% to 65%.  Wall motion was normal; there were no regional wall motion  abnormalities. Features are consistent with a pseudonormal left  ventricular filling pattern, with concomitant abnormal relaxation  and increased filling pressure (grade 2 diastolic dysfunction). - Aortic valve: Mildly to moderately calcified annulus. There was  mild stenosis. There was mild regurgitation. - Right atrium: The atrium was mildly dilated.  Impressions:  - The echo is technically difficult.  Transthoracic echocardiography. M-mode, complete 2D, spectral Doppler, and color Doppler. Birthdate: Patient birthdate: [birth year]. Age: Patient is 61 yr old. Sex: Gender: male. BMI: 36.5 kg/m^2. Blood pressure: 138/80 Patient status: Inpatient. Study date: Study date: 04/20/2015. Study time: 02:47 PM. Location: Echo laboratory.  -------------------------------------------------------------------  ------------------------------------------------------------------- Left ventricle: The cavity size was normal. Wall thickness was increased in a pattern of mild LVH. Systolic function was normal. The estimated ejection fraction was in the range of 60% to 65%. Wall motion was normal; there were no regional wall motion abnormalities. Features are consistent with a pseudonormal left ventricular filling pattern, with concomitant  abnormal relaxation and increased filling pressure (grade 2 diastolic dysfunction).  ------------------------------------------------------------------- Aortic valve: Mildly to moderately calcified annulus. Doppler: There was mild stenosis. There was mild regurgitation.  ------------------------------------------------------------------- Aorta: Aortic root: The aortic root was normal in size. Ascending aorta: The ascending aorta was normal in size.  ------------------------------------------------------------------- Mitral valve: Mildly thickened, mildly calcified leaflets . Doppler: There was no significant regurgitation. Peak gradient (D): 3 mm Hg.  ------------------------------------------------------------------- Left atrium: The atrium was normal in size.  ------------------------------------------------------------------- Right ventricle: The cavity size was normal. Systolic function was normal.  ------------------------------------------------------------------- Pulmonic valve: The valve appears to be grossly normal. Doppler: There was no significant regurgitation.  ------------------------------------------------------------------- Tricuspid valve: The valve appears to be grossly normal. Doppler: There was trivial regurgitation.  ------------------------------------------------------------------- Right atrium: The atrium was mildly dilated.  ------------------------------------------------------------------- Pericardium: There was no pericardial effusion.   ECHO 2014: Study Conclusions  - Left ventricle: The cavity size was at the upper limits of normal. There was mild-moderate concentric hypertrophy. Systolic function was normal. The estimated ejection fraction was in the range of 55% to 60%. Wall motion was normal; there were no regional wall motion abnormalities. Doppler parameters are consistent with  abnormal  left ventricular relaxation (grade 1 diastolic dysfunction). - Aortic valve: Mild regurgitation. Valve area: 2.05cm^2(VTI). Valve area: 2.09cm^2 (Vmax). - Mitral valve: Mild regurgitation. Valve area by continuity equation (using LVOT flow): 2.46cm^2. - Left atrium: The atrium was mildly dilated. - Right atrium: The atrium was mildly dilated. Transthoracic echocardiography. M-mode, complete 2D, spectral Doppler, and color Doppler. Height: Height: 193cm. Height: 76in. Weight: Weight: 136.1kg. Weight: 299.4lb. Body mass index: BMI: 36.5kg/m^2. Body surface area:  BSA: 2.89m^2. Blood pressure:   158/90. Patient status: Outpatient. Location: Echo laboratory.  ------------------------------------------------------------  ------------------------------------------------------------ Left ventricle: The cavity size was at the upper limits of normal. There was mild-moderate concentric hypertrophy. Systolic function was normal. The estimated ejection fraction was in the range of 55% to 60%. Wall motion was normal; there were no regional wall motion abnormalities. Doppler parameters are consistent with abnormal left ventricular relaxation (grade 1 diastolic dysfunction).  ------------------------------------------------------------ Aortic valve:  Trileaflet; mildly thickened leaflets. Doppler: Transvalvular velocity was within the normal range. There was no stenosis. Mild regurgitation.  VTI ratio of LVOT to aortic valve: 0.55. Valve area: 2.05cm^2(VTI). Indexed valve area: 0.78cm^2/m^2 (VTI). Peak velocity ratio of LVOT to aortic valve: 0.55. Valve area: 2.09cm^2 (Vmax). Indexed valve area: 0.79cm^2/m^2 (Vmax). Mean gradient: 65mm Hg (S). Peak gradient: 6mm Hg (S).  ------------------------------------------------------------ Aorta: The aorta was normal, not dilated, and non-diseased.  ------------------------------------------------------------ Mitral  valve: No echocardiographic evidence for prolapse. Doppler:  Mild regurgitation.  Valve area by pressure half-time: 3.38cm^2. Indexed valve area by pressure half-time: 1.29cm^2/m^2. Valve area by continuity equation (using LVOT flow): 2.46cm^2. Indexed valve area by continuity equation (using LVOT flow): 0.94cm^2/m^2.  Mean gradient: 22mm Hg (D). Peak gradient: 1mm Hg (D).  ------------------------------------------------------------ Left atrium: LA Volume/BSA 34.2 ml/m2. The atrium was mildly dilated.  ------------------------------------------------------------ Atrial septum: Poorly visualized.  ------------------------------------------------------------ Right ventricle: The cavity size was normal. Wall thickness was normal. The moderator band was in a normal position and normal-sized, without evidence of outflow obstruction. Systolic function was normal. Systolic pressure was within the normal range.  ------------------------------------------------------------ Pulmonic valve:  The valve appears to be grossly normal. Doppler:  No significant regurgitation.  ------------------------------------------------------------ Tricuspid valve:  Structurally normal valve.  Leaflet separation was normal. Doppler: Transvalvular velocity was within the normal range. Mild regurgitation.  ------------------------------------------------------------ Right atrium: The atrium was mildly dilated.  ------------------------------------------------------------ Pericardium: The pericardium was normal in appearance. There was no pericardial effusion.  ------------------------------------------------------------ Systemic veins: Inferior vena cava: The vessel was normal in size; the respirophasic diameter changes were in the normal range (= 50%); findings are consistent with normal central venous pressure.  ------------------------------------------------------------ Post  procedure conclusions Ascending Aorta:  - The aorta was normal, not dilated, and non-diseased.    Recent Lab Findings: Lab Results  Component Value Date   WBC 5.3 02/18/2016   HGB 13.5 02/18/2016   HCT 39.4 02/18/2016   PLT 199 02/18/2016   GLUCOSE 145 (H) 02/25/2016   ALT 26 09/28/2012   AST 17 09/28/2012   NA 139 02/25/2016   K 4.3 02/25/2016   CL 108 02/25/2016   CREATININE 0.90 02/25/2016   BUN 14 02/25/2016   CO2 25 02/25/2016   INR 1.01 03/05/2014   HGBA1C 5.6 02/18/2016   Aortic Size Index=     4.8   /Body surface area is 2.7 meters squared. = 1.85  < 2.75 cm/m2      4% risk per year 2.75 to 4.25          8% risk per year >  4.25 cm/m2    20% risk per year      Assessment / Plan: Coronary artery calcifications suggesting coronary artery disease.  4.8 cm ascending thoracic aortic aneurysm is noted. This is not significantly changed compared to prior exam-mild to moderate Aortic regurgitation directed centrally in the LVOT. I reviewed with the patient his diagnosis of ascending aortic aneurysm without trileaflet aortic valve and without positive family history of dilated aorta or aortic dissection. The risks and options of surgery and the timing of surgery was discussed in detail. We reviewed the signs and symptoms of acute aortic dissection.  I'll plan to see him back with a repeat CTA of the chest and echocardiogram in 8  months   Patient told  It's best to avoid activities that cause grunting or straining (medically referred to as a "valsalva maneuver"). This happens when a person bears down against a closed throat to increase the strength of arm or abdominal muscles. There's often a tendency to do this when lifting heavy weights, doing sit-ups, push-ups or chin-ups, etc., but it may be harmful.    According to the 2010 ACC/AHA guidelines, we recommend patients with thoracic aortic disease to maintain a LDL of less than 70 and a HDL of greater than 50. We recommend  their blood pressure to remain less than 135/85.      Grace Isaac MD      [address] [place] [address] Office [phone]   [place]  04/14/2016 1:32 PM

## 2016-04-14 NOTE — Progress Notes (Signed)
  Echocardiogram 2D Echocardiogram has been performed.  Jennette Dubin 04/14/2016, 10:02 AM

## 2016-05-11 ENCOUNTER — Other Ambulatory Visit (HOSPITAL_BASED_OUTPATIENT_CLINIC_OR_DEPARTMENT_OTHER): Payer: Self-pay

## 2016-05-11 DIAGNOSIS — G4730 Sleep apnea, unspecified: Secondary | ICD-10-CM

## 2016-05-11 DIAGNOSIS — G4710 Hypersomnia, unspecified: Secondary | ICD-10-CM

## 2016-05-11 DIAGNOSIS — R0683 Snoring: Secondary | ICD-10-CM

## 2016-05-17 ENCOUNTER — Ambulatory Visit: Payer: BC Managed Care – PPO | Admitting: Neurology

## 2016-07-07 ENCOUNTER — Encounter (HOSPITAL_BASED_OUTPATIENT_CLINIC_OR_DEPARTMENT_OTHER): Payer: BC Managed Care – PPO

## 2016-10-27 ENCOUNTER — Telehealth: Payer: Self-pay | Admitting: *Deleted

## 2016-10-27 NOTE — Telephone Encounter (Signed)
    Chart reviewed as part of pre-operative protocol coverage. Because of Travis Myers's past medical history and time since last visit, he/she will require a follow-up visit in order to better assess preoperative cardiovascular risk.   Last seen 06/2015. Patient of Dr. Gwenlyn Found.   Pre-op covering staff: - Please schedule appointment and call patient to inform them. - Please contact requesting surgeon's office via preferred method (i.e, phone, fax) to inform them of need for appointment prior to surgery.  [name], PA  10/27/2016, 1:51 PM

## 2016-10-27 NOTE — Telephone Encounter (Signed)
   [facility] Pre-operative Risk Assessment    Request for surgical clearance:  1. What type of surgery is being performed? RIGHT KNEE EUA, SCOPE, PARTIAL MEDIAL AND LATERAL MENISECTOMY, CHONDROPLASTY, KA MEDIAL AND LATERAL MENISECTOMY    2. When is this surgery scheduled? 12/15/16   3. Are there any medications that need to be held prior to surgery and how long?ASA   4. Practice name and name of physician performing surgery? [facility] DR BERRY   5. What is your office phone and fax number? [place]    6. Anesthesia type (None, local, MAC, general) ? UNKNOWN

## 2016-10-27 NOTE — Telephone Encounter (Signed)
Called patient to schedule a preop appt. Patients states that he will prefer to be seen by Dr.Berry only. He will see an APP only if he cannot get in to see in time to get cleared for his surgery in Dec 2018. Patients sts that he has met his deductible and needs to have the surgery before the end of the year. Advise pt that I will fwd the message to scheduling to call patient with an appt. Pt verbalized understanding.

## 2016-11-01 ENCOUNTER — Encounter: Payer: Self-pay | Admitting: Cardiovascular Disease

## 2016-11-01 ENCOUNTER — Ambulatory Visit (INDEPENDENT_AMBULATORY_CARE_PROVIDER_SITE_OTHER): Payer: BC Managed Care – PPO | Admitting: Cardiovascular Disease

## 2016-11-01 VITALS — BP 160/94 | HR 65 | Ht 76.5 in | Wt 317.4 lb

## 2016-11-01 DIAGNOSIS — E7800 Pure hypercholesterolemia, unspecified: Secondary | ICD-10-CM

## 2016-11-01 DIAGNOSIS — I10 Essential (primary) hypertension: Secondary | ICD-10-CM | POA: Diagnosis not present

## 2016-11-01 MED ORDER — AMLODIPINE BESYLATE 5 MG PO TABS: 5.0000 mg | ORAL_TABLET | Freq: Every day | ORAL | 3 refills | Status: DC

## 2016-11-01 NOTE — Assessment & Plan Note (Signed)
History of thoracic aortic aneurysm most recently measured at 48 mm by chest CT performed 04/14/16 followed by Dr. Pia Mau.

## 2016-11-01 NOTE — Assessment & Plan Note (Signed)
History of essential hypertension blood pressure measured at 172/92. He is on lisinopril and metoprolol. He is in chronic pain because of his back and knees which may be contributory. He does not eat an excessive amount of salt. I'm going to add amlodipine 5 mg a day I will have him come back to see Travis Myers in 2 weeks for follow-up.

## 2016-11-01 NOTE — Progress Notes (Addendum)
11/01/2016 Travis Myers   September 06, 1955  [id number]  Primary Physician Cyndi Bender, PA-C Primary Cardiologist: [name] MD Travis Myers, Travis Myers  HPI:  Travis Myers is a 61 y.o. male moderately overweight married Caucasian male with no children who works in the Transport planner at [employer]. He was previously a patient of Dr. Georgiann Mccoy His primary care provider is Cyndi Bender he PA-C at [facility]. I last saw him in the office 06/30/15. He has a history of treated hypertension and hyperlipidemia. There is no family history. He has never had a heart attack or stroke. He denies chest pain or shortness of breath. He does have a thoracic aortic aneurysm measuring 51 mm by CT angiogram performed in September last year. This is followed on an annual basis. He had a negative Myoview stress test performed in February of this year done for preoperative clearance before a right total hip replacement done via the anterior approach by Dr. Alvan Dame . He is recuperated from this nicely. He had a right meniscal surgery by Dr. Theda Sers as well. Recent CT angiogram showed that his thoracic aortic aneurysm measured 47mm and this is being followed by Dr. Servando Snare .his major complaint is of back and knee pain. He's gotten multiple injections by Dr. Nelva Bush . His blood pressure has been more difficult to control lately because of this.   Current Meds  Medication Sig  . aspirin EC 81 MG tablet Take 1 tablet (81 mg total) by mouth daily. Resume 4 days post-op  . ATORVASTATIN CALCIUM PO Take 1 tablet by mouth daily.  . Dermatological Products, Misc. (MOISTURE) CREA Apply 1-2 application topically 2 (two) times daily as needed (for dry skin). [employer] Emergency planning/management officer  . Emollient (EUCERIN ORIGINAL HEALING) LOTN Apply 1-2 application topically 4 (four) times daily as needed (for dry skin).  . furosemide (LASIX) 20 MG tablet Take 40 mg by mouth daily.  Marland Kitchen ibuprofen  (ADVIL,MOTRIN) 800 MG tablet Take 1 tablet (800 mg total) by mouth 3 (three) times daily. Resume 5 days post-op as needed  . lidocaine (ASPERCREME W/LIDOCAINE) 4 % cream Apply 1 application topically 3 (three) times daily as needed (for pain.).  Marland Kitchen lisinopril (PRINIVIL,ZESTRIL) 40 MG tablet TAKE 1 TABLET BY MOUTH EVERY DAY FOR BLOOD PRESSURE  . meclizine (ANTIVERT) 25 MG tablet Take 25 mg by mouth daily.  . metoprolol succinate (TOPROL-XL) 50 MG 24 hr tablet Take 50 mg by mouth daily. for high blood pressure  . traMADol (ULTRAM) 50 MG tablet Take 1 tablet by mouth as directed.     Allergies  Allergen Reactions  . Penicillins Other (See Comments)    Childhood reaction.  Has patient had a PCN reaction causing immediate rash, facial/tongue/throat swelling, SOB or lightheadedness with hypotension:unsure Has patient had a PCN reaction causing severe rash involving mucus membranes or skin necrosis:unsure Has patient had a PCN reaction that required hospitalization:unsure Has patient had a PCN reaction occurring within the last 10 years:No If all of the above answers are "NO", then may proceed with Cephalosporin use.     Social History   Social History  . Marital status: Married    Spouse name: N/A  . Number of children: N/A  . Years of education: N/A   Occupational History  . Not on file.   Social History Main Topics  . Smoking status: Never Smoker  . Smokeless tobacco: Former Systems developer    Quit date: 09/11/1988  .  Alcohol use 0.6 oz/week    1 Standard drinks or equivalent per week     Comment: RARE  . Drug use: No  . Sexual activity: Yes   Other Topics Concern  . Not on file   Social History Narrative  . No narrative on file     Review of Systems: General: negative for chills, fever, night sweats or weight changes.  Cardiovascular: negative for chest pain, dyspnea on exertion, edema, orthopnea, palpitations, paroxysmal nocturnal dyspnea or shortness of breath Dermatological:  negative for rash Respiratory: negative for cough or wheezing Urologic: negative for hematuria Abdominal: negative for nausea, vomiting, diarrhea, bright red blood per rectum, melena, or hematemesis Neurologic: negative for visual changes, syncope, or dizziness All other systems reviewed and are otherwise negative except as noted above.    Blood pressure (!) 160/94, pulse 65, height 6' 4.5" (1.943 m), weight (!) 317 lb 6.4 oz (144 kg).  General appearance: alert and no distress Neck: no adenopathy, no carotid bruit, no JVD, supple, symmetrical, trachea midline and thyroid not enlarged, symmetric, no tenderness/mass/nodules Lungs: clear to auscultation bilaterally Heart: regular rate and rhythm, S1, S2 normal, no murmur, click, rub or gallop Extremities: extremities normal, atraumatic, no cyanosis or edema Pulses: 2+ and symmetric Skin: Skin color, texture, turgor normal. No rashes or lesions Neurologic: Alert and oriented X 3, normal strength and tone. Normal symmetric reflexes. Normal coordination and gait  EKG sinus rhythm at 65 without ST or T-wave changes. I personally reviewed this EKG.  ASSESSMENT AND PLAN:   Thoracic aortic aneurysm History of thoracic aortic aneurysm most recently measured at 48 mm by chest CT performed 04/14/16 followed by Dr. Pia Mau.  Essential hypertension History of essential hypertension blood pressure measured at 172/92. He is on lisinopril and metoprolol. He is in chronic pain because of his back and knees which may be contributory. He does not eat an excessive amount of salt. I'm going to add amlodipine 5 mg a day I will have him come back to see Erasmo Downer in 2 weeks for follow-up.  Hyperlipidemia History of hyperlipidemia on statin therapy followed by his PCP.recent lipid profile performed 05/17/16 revealed total cholesterol 195, LDL 130 and HDL of 45.      [name] MD FACP,FACC,FAHA, [facility] 11/01/2016 4:37 PM

## 2016-11-01 NOTE — Patient Instructions (Signed)
Your physician has recommended you make the following change in your medication:  -- START amlodipine 5mg  once daily  Dr. Gwenlyn Found has requested that you schedule an appointment with one of our clinical pharmacists for a blood pressure check appointment within the next TWO weeks.  -- if you monitor your blood pressure (BP) at home, please bring your BP cuff and your BP readings with you to this appointment -- please check your BP no more than twice daily, after you have been sitting/resting for 5-10 minutes, at least 1 hour after taking your BP medications  Your physician wants you to follow-up in: 6 months with Dr. Gwenlyn Found. You will receive a reminder letter in the mail two months in advance. If you don't receive a letter, please call our office to schedule the follow-up appointment.

## 2016-11-01 NOTE — Assessment & Plan Note (Addendum)
History of hyperlipidemia on statin therapy followed by his PCP.recent lipid profile performed 05/17/16 revealed total cholesterol 195, LDL 130 and HDL of 45.

## 2016-11-17 ENCOUNTER — Ambulatory Visit (INDEPENDENT_AMBULATORY_CARE_PROVIDER_SITE_OTHER): Payer: BC Managed Care – PPO | Admitting: Pharmacist Clinician (PhC)/ Clinical Pharmacy Specialist

## 2016-11-17 DIAGNOSIS — I10 Essential (primary) hypertension: Secondary | ICD-10-CM

## 2016-11-17 MED ORDER — AMLODIPINE BESYLATE 10 MG PO TABS: 10.0000 mg | ORAL_TABLET | Freq: Every day | ORAL | 3 refills | Status: DC

## 2016-11-17 NOTE — Progress Notes (Signed)
11/21/2016 Travis Myers [birth year] [id number]   HPI:  Travis Myers is a 61 y.o. male patient of Dr Gwenlyn Found, with a PMH below who presents today for hypertension clinic evaluation.  In addition to hypertension, patient has a medical history significant for hyperlipidemia, obesity, spinal stenosis and thoracic aortic aneurysm.  He also reports chronic problems with vertigo, causing him to using meclizine daily.   Because of the spinal stenosis, he has been getting nerve block injections in his back with Dr. Nelva Bush for most of the past year, and patient admits that when he sees Dr. Nelva Bush, his BP has been as high as 200/112.  He admits to not liking the injections, so believes that the elevated readings there are a combination of pain and stress.  Travis Myers is scheduled for knee repair surgery in the next 2-3 weeks.   Blood Pressure Goal:  130/80  Current Medications:  Amlodipine 5 mg qd - am  Lisinopril 50 mg qd - am  Metoprolol succ 50 mg - am  Family Hx:  Nothing significant  Social Hx:  No tobacco, occasional alcohol. Still drinks 3-4 cups of coffee per day  Exercise:  Not able to do regular exercise due to back problems and upcoming knee surgery  Intolerances:   No antihypertensive sensitivities  Labs:  02/2016:  Na 139, K 4.3, Glu 145, BUN 14, SCr 0.9  Wt Readings from Last 3 Encounters:  11/01/16 (!) 317 lb 6.4 oz (144 kg)  04/14/16 300 lb (136.1 kg)  02/24/16 (!) 317 lb (143.8 kg)   BP Readings from Last 3 Encounters:  11/17/16 (!) 146/84  11/01/16 (!) 160/94  04/14/16 (!) 144/83   Pulse Readings from Last 3 Encounters:  11/17/16 72  11/01/16 65  04/14/16 72    Current Outpatient Medications  Medication Sig Dispense Refill  . amLODipine (NORVASC) 10 MG tablet Take 1 tablet (10 mg total) daily by mouth. 90 tablet 3  . aspirin EC 81 MG tablet Take 1 tablet (81 mg total) by mouth daily. Resume 4 days post-op    . ATORVASTATIN CALCIUM PO  Take 1 tablet by mouth daily.    . Dermatological Products, Misc. (MOISTURE) CREA Apply 1-2 application topically 2 (two) times daily as needed (for dry skin). [employer] Emergency planning/management officer    . Emollient (EUCERIN ORIGINAL HEALING) LOTN Apply 1-2 application topically 4 (four) times daily as needed (for dry skin).    . furosemide (LASIX) 20 MG tablet Take 40 mg by mouth daily.  5  . ibuprofen (ADVIL,MOTRIN) 800 MG tablet Take 1 tablet (800 mg total) by mouth 3 (three) times daily. Resume 5 days post-op as needed 30 tablet 0  . lidocaine (ASPERCREME W/LIDOCAINE) 4 % cream Apply 1 application topically 3 (three) times daily as needed (for pain.).    Marland Kitchen lisinopril (PRINIVIL,ZESTRIL) 40 MG tablet TAKE 1 TABLET BY MOUTH EVERY DAY FOR BLOOD PRESSURE  5  . meclizine (ANTIVERT) 25 MG tablet Take 25 mg by mouth daily.  0  . metoprolol succinate (TOPROL-XL) 50 MG 24 hr tablet Take 50 mg by mouth daily. for high blood pressure  5  . traMADol (ULTRAM) 50 MG tablet Take 1 tablet by mouth as directed.  2   No current facility-administered medications for this visit.     Allergies  Allergen Reactions  . Penicillins Other (See Comments)    Childhood reaction.  Has patient had a PCN reaction causing immediate rash, facial/tongue/throat swelling, SOB  or lightheadedness with hypotension:unsure Has patient had a PCN reaction causing severe rash involving mucus membranes or skin necrosis:unsure Has patient had a PCN reaction that required hospitalization:unsure Has patient had a PCN reaction occurring within the last 10 years:No If all of the above answers are "NO", then may proceed with Cephalosporin use.     Past Medical History:  Diagnosis Date  . Acute meniscal tear of knee left  . Aneurysm, ascending aorta (HCC) 4.8cm per ct 2011/ CARDIOLOGSIT--  DR Rollene Fare  LOV  10-07-2011  NOTE W/ CHART AND REQUEST LEXISCAN MYOVIEW AND ECHO TO BE FAXED.   ASYMPTOMATIC  . Heart murmur MILD-- ASYMPTOMATIC  .  Hyperlipidemia   . Hypertension   . OA (osteoarthritis) of knee left  . OSA (obstructive sleep apnea) NON-TOLERANT CPAP   no cpap  . Pneumonia   . Pre-diabetes   . Sleep apnea   . Tinnitus of both ears     Blood pressure (!) 146/84, pulse 72.  Essential hypertension Patient with essential hypertension, still not well controlled.  The elevation could be related to his chronic pain situation.  Will increase the amlodipine to 10 mg daily.  Because of upcoming knee surgery, we will see him back in 2 months for BP follow up.  Hopefully some decrease in daily pain levels as well as exercise from rehab will bring his BP down to goal.     Tommy Medal PharmD CPP [facility] [address] [place], [place] [address] [phone]

## 2016-11-17 NOTE — Patient Instructions (Addendum)
Return for a a follow up appointment on December 27  Your blood pressure today is 146/84  Take your BP meds as follows:  Increase amlodipine to 10 mg (2 tablets until gone then start the 10 mg tablets)  Bring all of your meds, your BP cuff and your record of home blood pressures to your next appointment.  Exercise as you're able, try to walk approximately 30 minutes per day.  Keep salt intake to a minimum, especially watch canned and prepared boxed foods.  Eat more fresh fruits and vegetables and fewer canned items.  Avoid eating in fast food restaurants.    HOW TO TAKE YOUR BLOOD PRESSURE: . Rest 5 minutes before taking your blood pressure. .  Don't smoke or drink caffeinated beverages for at least 30 minutes before. . Take your blood pressure before (not after) you eat. . Sit comfortably with your back supported and both feet on the floor (don't cross your legs). . Elevate your arm to heart level on a table or a desk. . Use the proper sized cuff. It should fit smoothly and snugly around your bare upper arm. There should be enough room to slip a fingertip under the cuff. The bottom edge of the cuff should be 1 inch above the crease of the elbow. . Ideally, take 3 measurements at one sitting and record the average.

## 2016-11-21 ENCOUNTER — Encounter: Payer: Self-pay | Admitting: Pharmacist Clinician (PhC)/ Clinical Pharmacy Specialist

## 2016-11-21 NOTE — Assessment & Plan Note (Addendum)
Patient with essential hypertension, still not well controlled.  The elevation could be related to his chronic pain situation.  Will increase the amlodipine to 10 mg daily.  Because of upcoming knee surgery, we will see him back in 2 months for BP follow up.  Hopefully some decrease in daily pain levels as well as exercise from rehab will bring his BP down to goal.

## 2016-12-08 ENCOUNTER — Telehealth: Payer: Self-pay

## 2016-12-08 ENCOUNTER — Other Ambulatory Visit: Payer: Self-pay | Admitting: Cardiothoracic Surgery

## 2016-12-08 DIAGNOSIS — I712 Thoracic aortic aneurysm, without rupture, unspecified: Secondary | ICD-10-CM

## 2016-12-08 NOTE — Telephone Encounter (Signed)
   [facility] Pre-operative Risk Assessment    Request for surgical clearance:  1. What type of surgery is being performed? RIGHT KNEE EUA, SCOPE, PARTIAL MEDICAL AND LATERAL MENISECTOMY CHONDROPLASTY, KA MEDICAL AND LATERAL MENISECTOMY   2. When is this surgery scheduled? NOT LISTED   3. Are there any medications that need to be held prior to surgery and how long?ASA   4. Practice name and name of physician performing surgery? [facility] ATTN:LAURIE FAUST   5. What is your office phone and fax number? [phone] FX [phone]   6. Anesthesia type (None, local, MAC, general) ? NOT LISTED   Waylan Rocher 12/08/2016, 7:08 AM  _________________________________________________________________   (provider comments below)

## 2016-12-09 NOTE — Telephone Encounter (Signed)
He would need cardiac cath prior to Ao Root replacement

## 2016-12-09 NOTE — Telephone Encounter (Signed)
    Chart reviewed as part of pre-operative protocol coverage.  The patient was last seen by Dr. Gwenlyn Found 11/01/16 at which time his blood pressure was elevated, prompting initiation of amlodipine with titration to 10mg  daily at f/u visit with pharmD on 11/17/16. Patient has known 61mm thoracic aortic aneurysm with goal BP <135/85 per Dr. Servando Snare. This is due for follow-up in the next few weeks - has appt with Dr. Servando Snare on 12/29/16 to review updated CT scan which has not yet been obtained. Will forward to Dr. Gwenlyn Found to get his input on whether this information should be obtained prior to clearing for knee surgery.   Also Dr. Gwenlyn Found, note last CT 04/2016 also showed coronary artery calcifications suggesting CAD. Remote nuc in 2016 was normal. Let us know if this weighs into your decision. Please route response to [facility] PREOP. Thanks!  Charlie Pitter, PA-C  12/09/2016, 9:10 AM

## 2016-12-09 NOTE — Telephone Encounter (Signed)
Question below is regarding knee surgery, not aortic root replacement. Patient is pending appt with [facility] in 12/2016 for routine surveillance of thoracic aneurysm, but we received pre-op clearance for knee surgery in the interim. Thanks. Dayna Dunn PA-C

## 2016-12-19 NOTE — Telephone Encounter (Signed)
   Provider reply not yet received. Will route back to Dr. Gwenlyn Found for clarification - Dr. Gwenlyn Found, please see note on 12/09/16 - do you feel patient needs updated eval for his aneurysm as planned this month before clearing for knee surgery? He has appt 12/20 with Dr. Servando Snare to reassess size and plan. Please route reply to P CV DIV PREOP. Thanks!  Charlie Pitter, PA-C 12/19/2016, 9:15 AM

## 2016-12-20 NOTE — Telephone Encounter (Signed)
His TAA was 48 mm by CTA back in April 2018. This should not affect his orthopedic procedure and is followed annually by Dr Servando Snare.

## 2016-12-21 NOTE — Telephone Encounter (Signed)
    Contacted the patient regarding pre-op clearance and he informed me he actually underwent surgery last Thursday. Denies any known complications and says he is recovering well. He has plans to have repeat imaging of his thoracic aneurysm later this month and follow-up with Dr. Servando Snare afterwards.  Passed along we are glad to hear he is recovering well and he will [facility]-up with [facility] later this month as previously scheduled.   Signed, Erma Heritage, PA-C 12/21/2016, 1:55 PM

## 2016-12-21 NOTE — Telephone Encounter (Signed)
See note below, will need phone call by pre-op team. Melina Copa PA-C

## 2016-12-29 ENCOUNTER — Ambulatory Visit: Payer: BC Managed Care – PPO | Admitting: Cardiothoracic Surgery

## 2016-12-29 ENCOUNTER — Ambulatory Visit
Admission: RE | Admit: 2016-12-29 | Discharge: 2016-12-29 | Disposition: A | Discharge status: Home / Self Care | Payer: BC Managed Care – PPO | Source: Ambulatory Visit | Attending: Cardiothoracic Surgery | Admitting: Cardiothoracic Surgery

## 2016-12-29 DIAGNOSIS — I712 Thoracic aortic aneurysm, without rupture, unspecified: Secondary | ICD-10-CM

## 2016-12-29 MED ORDER — IOPAMIDOL (ISOVUE-370) INJECTION 76%
75.0000 mL | Freq: Once | INTRAVENOUS | Status: AC | PRN
  Administered 2016-12-29: 75 mL via INTRAVENOUS

## 2017-01-05 ENCOUNTER — Ambulatory Visit: Payer: BC Managed Care – PPO

## 2017-01-19 NOTE — Progress Notes (Signed)
[address]       [place] [address]             [phone]                    Travis Myers [place] Medical Record #[id number] Date of Birth: 02/11/1955  Referring: [name], MD Primary Care: Cyndi Bender, PA-C Primary Cardiologist: No primary care provider on file. Chief Complaint:    Chief Complaint  Patient presents with  . Follow-up    Thoracic Aortic Aneurysm with chest CT 12/29/2016    History of Present Illness:    Travis Myers 62 y.o. male who was originally seen in the officeby me in oct 2016 at the request of Dr. Gwenlyn Found for dilated ascending aorta first noted in 2011.  First  The dilated aorta was first noted after abnormal preop chest x-ray before hip replacement in 2011, subsequent CT scan of the chest to rule out lung mass showed multiple small pulmonary nodules which are been unchanged over the years. His ascending aorta was noted to be dilated to 4.8 cm. Subsequent follow-up CT scans of the chest have been done in 2013 2014 2015 and fall  2016. An echocardiogram was done in 2014 and repeated 04/2015 . The patient has been asymptomatic from a cardiovascular standpoint. He has no evidence of congestive heart failure. He says these had a history of a murmur since childhood. He has no known family history of connective tissue disorder. He had a grandfather on his mother's side died of" heart disease" at age " 63 , But without any other details other than he died several hours after fishing at [employer] and bringing in a large fish. His father died at age 63 with lung cancer and COPD, he did have history of open repair of abdominal aortic aneurysm at 44.    Since last seen the patient has had arthroscopy on his right knee.  He notes that this is significantly improved his mobility and relieved him of knee and hip and back pain.  Patient comes in today with a follow-up CTA of the chest  Current Activity/ Functional  Status:  Patient is independent with mobility/ambulation, transfers, ADL's, IADL's.   Zubrod Score: At the time of surgery this patient's most appropriate activity status/level should be described as: [x]     0    Normal activity, no symptoms []     1    Restricted in physical strenuous activity but ambulatory, able to do out light work []     2    Ambulatory and capable of self care, unable to do work activities, up and about               >50 % of waking hours                              []     3    Only limited self care, in bed greater than 50% of waking hours []     4    Completely disabled, no self care, confined to bed or chair []     5    Moribund   Past Medical History:  Diagnosis Date  . Acute meniscal tear of knee left  . Aneurysm, ascending aorta (HCC) 4.8cm per ct 2011/ CARDIOLOGSIT--  DR Rollene Fare  LOV  10-07-2011  NOTE W/ CHART AND REQUEST  [place] AND ECHO TO BE FAXED.   ASYMPTOMATIC  . Heart murmur MILD-- ASYMPTOMATIC  . Hyperlipidemia   . Hypertension   . OA (osteoarthritis) of knee left  . OSA (obstructive sleep apnea) NON-TOLERANT CPAP   no cpap  . Pneumonia   . Pre-diabetes   . Sleep apnea   . Tinnitus of both ears     Past Surgical History:  Procedure Laterality Date  . [place]  . DOPPLER ECHOCARDIOGRAPHY  10/18/2011   EF >55 %  . LUMBAR LAMINECTOMY/DECOMPRESSION MICRODISCECTOMY N/A 02/24/2016   Procedure: Bilateral Decompression L4-5, Discectomy L4-5 ;  Surgeon: Susa Day, MD;  Location: [facility];  Service: Orthopedics;  Laterality: N/A;  . NM MYOCAR PERF WALL MOTION  10/13/2011   EF 49 %,low risk study  . TONSILLECTOMY  1965  . torn meniscus left knee surgery     . TOTAL HIP ARTHROPLASTY  06-18-2009   OA LEFT HIP  . TOTAL HIP ARTHROPLASTY Right 03/11/2014   Procedure: RIGHT TOTAL HIP ARTHROPLASTY ANTERIOR APPROACH;  Surgeon: [name], MD;  Location: [facility];  Service: Orthopedics;  Laterality: Right;  .  TRANSTHORACIC ECHOCARDIOGRAM  09/11/2012   EF 55 to 60 %,mild aortic valve regurg,mild mitral valve regurg,lf and rt atriums mildly dilated  . VARICOSE VEIN SURGERY Left 1980  . veins removed from left leg  1994    Family History  Problem Relation Age of Onset  . Diabetes Mother   . Cancer Mother   . Heart failure Other   . Diabetes Other   . Cancer Other   . COPD Other   . Colon cancer Neg Hx   . Stomach cancer Neg Hx     Social History   Socioeconomic History  . Marital status: Married    Spouse name: Not on file  . Number of children: Not on file  . Years of education: Not on file  . Highest education level: Not on file  Social Needs  . Financial resource strain: Not on file  . Food insecurity - worry: Not on file  . Food insecurity - inability: Not on file  . Transportation needs - medical: Not on file  . Transportation needs - non-medical: Not on file  Occupational History  . Not on file  Tobacco Use  . Smoking status: Never Smoker  . Smokeless tobacco: Former Network engineer and Sexual Activity  . Alcohol use: Yes    Alcohol/week: 0.6 oz    Types: 1 Standard drinks or equivalent per week    Comment: RARE  . Drug use: No  . Sexual activity: Yes  Other Topics Concern  . Not on file  Social History Narrative  . Not on file    Social History   Tobacco Use  Smoking Status Never Smoker  Smokeless Tobacco Former Systems developer    Social History   Substance and Sexual Activity  Alcohol Use Yes  . Alcohol/week: 0.6 oz  . Types: 1 Standard drinks or equivalent per week   Comment: RARE     Allergies  Allergen Reactions  . Penicillins Other (See Comments)    Childhood reaction.  Has patient had a PCN reaction causing immediate rash, facial/tongue/throat swelling, SOB or lightheadedness with hypotension:unsure Has patient had a PCN reaction causing severe rash involving mucus membranes or skin necrosis:unsure Has patient had a PCN reaction that required  hospitalization:unsure Has patient had a PCN reaction occurring within the last 10 years:No If all of  the above answers are "NO", then may proceed with Cephalosporin use.     Current Outpatient Medications  Medication Sig Dispense Refill  . amLODipine (NORVASC) 10 MG tablet Take 1 tablet (10 mg total) daily by mouth. 90 tablet 3  . aspirin EC 81 MG tablet Take 1 tablet (81 mg total) by mouth daily. Resume 4 days post-op    . ATORVASTATIN CALCIUM PO Take 1 tablet by mouth daily.    . Dermatological Products, Misc. (MOISTURE) CREA Apply 1-2 application topically 2 (two) times daily as needed (for dry skin). [employer] Emergency planning/management officer    . Emollient (EUCERIN ORIGINAL HEALING) LOTN Apply 1-2 application topically 4 (four) times daily as needed (for dry skin).    . furosemide (LASIX) 20 MG tablet Take 40 mg by mouth daily.  5  . ibuprofen (ADVIL,MOTRIN) 800 MG tablet Take 1 tablet (800 mg total) by mouth 3 (three) times daily. Resume 5 days post-op as needed 30 tablet 0  . lisinopril (PRINIVIL,ZESTRIL) 40 MG tablet TAKE 1 TABLET BY MOUTH EVERY DAY FOR BLOOD PRESSURE  5  . meclizine (ANTIVERT) 25 MG tablet Take 25 mg by mouth daily.  0  . metoprolol succinate (TOPROL-XL) 50 MG 24 hr tablet Take 50 mg by mouth daily. for high blood pressure  5  . traMADol (ULTRAM) 50 MG tablet Take 1 tablet by mouth as directed.  2   No current facility-administered medications for this visit.     Review of Systems:  Review of Systems  Constitutional: Positive for weight loss. Negative for chills, diaphoresis, fever and malaise/fatigue.  HENT: Negative.   Eyes: Negative.   Respiratory: Negative.   Cardiovascular: Negative.   Gastrointestinal: Negative.   Genitourinary: Negative.   Musculoskeletal: Positive for joint pain. Negative for back pain, myalgias and neck pain.  Skin: Negative.   Neurological: Negative.  Negative for weakness.  Endo/Heme/Allergies: Negative.   Psychiatric/Behavioral: Negative.        Physical Exam: BP 137/89 (BP Location: Left Arm, Patient Position: Sitting, Cuff Size: Large)   Pulse 66   Resp 18   Ht 6' 4.5" (1.943 m)   Wt (!) 320 lb 12.8 oz (145.5 kg)   SpO2 96% Comment: RA  BMI 38.54 kg/m   PHYSICAL EXAMINATION: General appearance: alert, cooperative, appears stated age and no distress Head: Normocephalic, without obvious abnormality, atraumatic Neck: no adenopathy, no carotid bruit, no JVD, supple, symmetrical, trachea midline and thyroid not enlarged, symmetric, no tenderness/mass/nodules Lymph nodes: Cervical, supraclavicular, and axillary nodes normal. Resp: clear to auscultation bilaterally Back: symmetric, no curvature. ROM normal. No CVA tenderness. Cardio: regular rate and rhythm, S1, S2 normal, no murmur, click, rub or gallop GI: soft, non-tender; bowel sounds normal; no masses,  no organomegaly Extremities: Patient has a healed venous stasis ulcer at the left ankle he noted that it took months healing, no gangrene or trophic changes, patient has significant bilateral varicose veins noted and venous stasis dermatitis noted Neurologic: Grossly normal Radial brachial femoral DP and PT pulses are present bilaterally     Diagnostic Studies & Laboratory data:     Recent Radiology Findings:  Ct Angio Chest Aorta W/cm &/or Wo/cm  Result Date: 12/29/2016 CLINICAL DATA:  Thoracic aortic aneurysm follow-up. EXAM: CT ANGIOGRAPHY CHEST WITH CONTRAST TECHNIQUE: Multidetector CT imaging of the chest was performed using the standard protocol during bolus administration of intravenous contrast. Multiplanar CT image reconstructions and MIPs were obtained to evaluate the vascular anatomy. CONTRAST:  75mL ISOVUE-370 IOPAMIDOL (ISOVUE-370) INJECTION 76%  COMPARISON:  CTA chest dated April 14, 2016. FINDINGS: Cardiovascular: Preferential opacification of the thoracic aorta. Stable 4.8 cm ascending thoracic aortic aneurysm. No evidence of thoracic aortic dissection.  Normal heart size. No pericardial effusion. Coronary artery atherosclerotic calcifications. Mediastinum/Nodes: No enlarged mediastinal, hilar, or axillary lymph nodes. Thyroid gland, trachea, and esophagus demonstrate no significant findings. Lungs/Pleura: Lungs are clear. No pleural effusion or pneumothorax. Upper Abdomen: No acute abnormality. Musculoskeletal: No chest wall abnormality. No acute or significant osseous findings. Review of the MIP images confirms the above findings. IMPRESSION: 1. Stable 4.8 cm ascending thoracic aortic aneurysm. Recommend semi-annual imaging followup by CTA or MRA and referral to cardiothoracic surgery if not already obtained. This recommendation follows 2010 ACCF/AHA/AATS/ACR/ASA/SCA/SCAI/SIR/STS/SVM Guidelines for the Diagnosis and Management of Patients With Thoracic Aortic Disease. Circulation. 2010; 121: J478-G956 Electronically Signed   By: Titus Dubin M.D.   On: 12/29/2016 15:44   I have independently reviewed the above radiology studies  and reviewed the findings with the patient.     Ct Angio Chest Aorta W/cm &/or Wo/cm  Result Date: 04/14/2016 CLINICAL DATA:  Thoracic aortic aneurysm without rupture. EXAM: CT ANGIOGRAPHY CHEST WITH CONTRAST TECHNIQUE: Multidetector CT imaging of the chest was performed using the standard protocol during bolus administration of intravenous contrast. Multiplanar CT image reconstructions and MIPs were obtained to evaluate the vascular anatomy. CONTRAST:  75 mL of Isovue 370 intravenously. COMPARISON:  CT scan of April 23, 2015. FINDINGS: Cardiovascular: Stable 4.8 cm ascending thoracic aortic aneurysm is noted. Great vessels are widely patent without significant stenosis. Transverse aortic arch measures 2.9 cm. Proximal descending thoracic aorta measures 2.8 cm. Coronary artery calcifications are noted. Normal cardiac size. No pericardial effusion is noted. Mediastinum/Nodes: No enlarged mediastinal, hilar, or axillary lymph  nodes. Thyroid gland, trachea, and esophagus demonstrate no significant findings. Lungs/Pleura: Lungs are clear. No pleural effusion or pneumothorax. Upper Abdomen: No acute abnormality. Musculoskeletal: No chest wall abnormality. No acute or significant osseous findings. Review of the MIP images confirms the above findings. IMPRESSION: Stable 4.8 cm ascending thoracic aortic aneurysm. Ascending thoracic aortic aneurysm. Recommend semi-annual imaging followup by CTA or MRA and referral to cardiothoracic surgery if not already obtained. This recommendation follows 2010 ACCF/AHA/AATS/ACR/ASA/SCA/SCAI/SIR/STS/SVM Guidelines for the Diagnosis and Management of Patients With Thoracic Aortic Disease. Circulation. 2010; 121: O130-Q657. Coronary artery calcifications suggesting coronary artery disease. Electronically Signed   By: Marijo Conception, M.D.   On: 04/14/2016 14:04    Ct Angio Chest Aorta W/cm &/or Wo/cm  04/23/2015  CLINICAL DATA:  Thoracic aortic aneurysm without rupture. EXAM: CT ANGIOGRAPHY CHEST WITH CONTRAST TECHNIQUE: Multidetector CT imaging of the chest was performed using the standard protocol during bolus administration of intravenous contrast. Multiplanar CT image reconstructions and MIPs were obtained to evaluate the vascular anatomy. CONTRAST:  75 mL of Isovue 370 intravenously. COMPARISON:  CT scan of October 02, 2014. FINDINGS: No pneumothorax or pleural effusion is noted. No acute pulmonary disease is noted. Coronary artery calcifications are noted. Aortic root measures 3.3 cm in diameter. Ascending thoracic aorta has a maximum measured diameter 4.8 cm which is not significantly changed compared to prior exam based on my own measurement. Transverse aortic arch measures 2.9 cm in diameter. Descending thoracic aorta measures 2.6 cm in diameter. No dissection is noted. No mediastinal mass or adenopathy is noted. Visualized portion of upper abdomen is unremarkable. No significant osseous  abnormality is noted. Review of the MIP images confirms the above findings. IMPRESSION: Coronary artery calcifications suggesting coronary artery  disease. 4.8 cm ascending thoracic aortic aneurysm is noted. This is not significantly changed compared to prior exam. Recommend semi-annual imaging followup by CTA or MRA and referral to cardiothoracic surgery if not already obtained. This recommendation follows 2010 ACCF/AHA/AATS/ACR/ASA/SCA/SCAI/SIR/STS/SVM Guidelines for the Diagnosis and Management of Patients With Thoracic Aortic Disease. Circulation. 2010; 121: Z610-R604. Electronically Signed   By: Marijo Conception, M.D.   On: 04/23/2015 14:31  Ct Angio Chest Aorta W/cm &/or Wo/cm  10/02/2014   CLINICAL DATA:  History of thoracic aortic aneurysm 3 years, followup  EXAM: CT ANGIOGRAPHY CHEST WITH CONTRAST  TECHNIQUE: Multidetector CT imaging of the chest was performed using the standard protocol during bolus administration of intravenous contrast. Multiplanar CT image reconstructions and MIPs were obtained to evaluate the vascular anatomy.  CONTRAST:  75 cc Isovue 370  COMPARISON:  CT angio chest of 10/04/2013  FINDINGS: There is again fusiform dilatation of the ascending aorta noted. Measured in the same planes as measured previously, the mid thoracic ascending aorta measures 4.8 x 5.0 cm compared to 4.8 x 4.9 cm previously. Cardiothoracic surgery consultation recommended due to increased risk of rupture for arch aneurysm ? 5.5 cm. This recommendation follows 2010 ACCF/AHA/AATS/ACR/ASA/SCA/SCAI/SIR/STS/SVM Guidelines for the Diagnosis and Management of Patients With Thoracic Aortic Disease. Circulation. 2010; 121: V409-W119. The mid thoracic aortic arch measures 2.7 cm. The mid descending thoracic aorta measured at the same level as the ascending aorta measures 2.6 cm. The distal thoracic aorta just above the diaphragmatic screw measures 2.7 cm. These measurements appear unchanged.  No mediastinal or hilar  adenopathy is seen. The pulmonary arteries are not well opacified cannot and cannot be assessed. Extensive coronary artery calcifications are present primarily involving the left anterior descending artery and circumflex artery. Mild cardiomegaly is stable. 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 Correlation with surgical history is recommended. There are degenerative changes throughout the mid to lower thoracic spine.  On lung window images, no lung parenchymal abnormality is seen. No suspicious lung nodule is seen and there is no evidence of pleural effusion. The central airway is patent.  Review of the MIP images confirms the above findings.  IMPRESSION: 1. The mid ascending thoracic aorta measures 4.8 x 5.0 cm compared to 4.8 x 4.9 cm previously. Cardiothoracic surgery consultation recommended due to increased risk of rupture for arch aneurysm ? 5.5 cm. This recommendation follows 2010 ACCF/AHA/AATS/ACR/ASA/SCA/SCAI/SIR/STS/SVM Guidelines for the Diagnosis and Management of Patients With Thoracic Aortic Disease. Circulation. 2010; 121: J478-G956 . 2. Significant coronary artery calcifications primarily involving the left anterior descending and circumflex coronary arteries.   Electronically Signed   By: Ivar Drape M.D.   On: 10/02/2014 09:17   ECHO 04/13/2016 Echocardiography  Patient:    Reyden, Smith MR #:       [id number] Study Date: 04/14/2016 Gender:     M Age:        53 Height:     193 cm Weight:     143.8 kg BSA:        2.83 m^2 Pt. Status: Room:   ATTENDING    Lanelle Bal MD  ORDERING     Lanelle Bal MD  [place] MD  PERFORMING   [facility], Outpatient  SONOGRAPHER  Mikki Santee  cc:  ------------------------------------------------------------------- LV EF: 55% -   60%  ------------------------------------------------------------------- Indications:      Aneurysm - thoracic  441.2.  -------------------------------------------------------------------  History:   PMH:  Aneurysm of the Ascending Aorta.  ------------------------------------------------------------------- Study Conclusions  - Left ventricle: The cavity size was normal. Wall thickness was   normal. Systolic function was normal. The estimated ejection   fraction was in the range of 55% to 60%. Wall motion was normal;   there were no regional wall motion abnormalities. Doppler   parameters are consistent with abnormal left ventricular   relaxation (grade 1 diastolic dysfunction). - Aortic valve: There was mild to moderate regurgitation directed   centrally in the LVOT. - Left atrium: The atrium was moderately dilated. - Atrial septum: No defect or patent foramen ovale was identified.  ------------------------------------------------------------------- Study data:  Comparison was made to the study of 04/20/2015.  Study status:  Routine.  Procedure:  The patient reported no pain pre or post test. Transthoracic echocardiography. Image quality was adequate.  Study completion:  There were no complications. Echocardiography.  M-mode, complete 2D, spectral Doppler, and color Doppler.  Birthdate:  Patient birthdate: February 13, 1955.  Age:  Patient is 62 yr old.  Sex:  Gender: male.    BMI: 38.6 kg/m^2.  Blood pressure:     153/87  Patient status:  Outpatient.  Study date: Study date: 04/14/2016. Study time: 08:54 AM.  Location:  Echo laboratory.  -------------------------------------------------------------------  ------------------------------------------------------------------- Left ventricle:  The cavity size was normal. Wall thickness was normal. Systolic function was normal. The estimated ejection fraction was in the range of 55% to 60%. Wall motion was normal; there were no regional wall motion abnormalities. Doppler parameters are consistent with abnormal left ventricular relaxation (grade  1 diastolic dysfunction).  ------------------------------------------------------------------- Aortic valve:   Structurally normal valve. Trileaflet. Cusp separation was normal.  Doppler:  Transvalvular velocity was within the normal range. There was no stenosis. There was mild to moderate regurgitation directed centrally in the LVOT.  ------------------------------------------------------------------- Aorta:  The aorta was poorly visualized. Ascending aorta: The ascending aorta was moderately dilated.  ------------------------------------------------------------------- Mitral valve:   Structurally normal valve.   Leaflet separation was normal.  Doppler:  Transvalvular velocity was within the normal range. There was no evidence for stenosis. There was no regurgitation.    Peak gradient (D): 3 mm Hg.  ------------------------------------------------------------------- Left atrium:  The atrium was moderately dilated.  ------------------------------------------------------------------- Atrial septum:  No defect or patent foramen ovale was identified.   ------------------------------------------------------------------- Right ventricle:  Poorly visualized. The cavity size was normal. Systolic function was normal.  ------------------------------------------------------------------- Pulmonic valve:    Structurally normal valve.   Cusp separation was normal.  Doppler:  Transvalvular velocity was within the normal range. There was no regurgitation.  ------------------------------------------------------------------- Tricuspid valve:   Structurally normal valve.   Leaflet separation was normal.  Doppler:  Transvalvular velocity was within the normal range. There was trivial regurgitation.  ------------------------------------------------------------------- Pulmonary artery:   Systolic pressure was within the normal range.    ------------------------------------------------------------------- Right atrium:  The atrium was normal in size.  ------------------------------------------------------------------- Systemic veins: Inferior vena cava: The vessel was normal in size. The respirophasic diameter changes were in the normal range (= 50%), consistent with normal central venous pressure.  ------------------------------------------------------------------- Post procedure conclusions Ascending Aorta:  - The aorta was poorly visualized.  ------------------------------------------------------------------- Measurements   Left ventricle                          Value        Reference  LV ID, ED, PLAX chordal          (  H)    53.5  mm     43 - 52  LV ID, ES, PLAX chordal                 32.1  mm     23 - 38  LV fx shortening, PLAX chordal          40    %      >=29  LV PW thickness, ED                     11.3  mm     ----------  IVS/LV PW ratio, ED                     1.13         <=1.3  Stroke volume, 2D                       134   ml     ----------  Stroke volume/bsa, 2D                   47    ml/m^2 ----------  LV e&', lateral                          6.64  cm/s   ----------  LV E/e&', lateral                        12.68        ----------  LV e&', medial                           7.4   cm/s   ----------  LV E/e&', medial                         11.38        ----------  LV e&', average                          7.02  cm/s   ----------  LV E/e&', average                        11.99        ----------  Longitudinal strain, TDI                18    %      ----------    Ventricular septum                      Value        Reference  IVS thickness, ED                       12.8  mm     ----------    LVOT                                    Value        Reference  LVOT ID, S                              23  mm     ----------  LVOT area                               4.15  cm^2   ----------  LVOT peak  velocity, S                   124   cm/s   ----------  LVOT mean velocity, S                   82.3  cm/s   ----------  LVOT VTI, S                             32.3  cm     ----------  LVOT peak gradient, S                   6     mm Hg  ----------    Aortic valve                            Value        Reference  Aortic regurg pressure half-time        816   ms     ----------    Aorta                                   Value        Reference  Aortic root ID, ED                      26    mm     ----------  Ascending aorta ID, A-P, mid, ED (H)    47.4  mm     21 - 34    Left atrium                             Value        Reference  LA ID, A-P, ES                          45    mm     ----------  LA ID/bsa, A-P                          1.59  cm/m^2 <=2.2  LA volume, ES, 1-p A4C                  49.8  ml     ----------  LA volume/bsa, ES, 1-p A4C              17.6  ml/m^2 ----------  LA volume, ES, 1-p A2C                  81.6  ml     ----------  LA volume/bsa, ES, 1-p A2C              28.9  ml/m^2 ----------    Mitral valve                            Value  Reference  Mitral E-wave peak velocity             84.2  cm/s   ----------  Mitral A-wave peak velocity             90.6  cm/s   ----------  Mitral deceleration time                215   ms     150 - 230  Mitral peak gradient, D                 3     mm Hg  ----------  Mitral E/A ratio, peak                  0.9          ----------    Pulmonary arteries                      Value        Reference  PA pressure, S, DP                      25    mm Hg  <=30    Tricuspid valve                         Value        Reference  Tricuspid regurg peak velocity          235   cm/s   ----------  Tricuspid peak RV-RA gradient           22    mm Hg  ----------    Right atrium                            Value        Reference  RA ID, S-I, ES, A4C              (H)    67.8  mm     34 - 49  RA area, ES, A4C                 (H)    24.3   cm^2   8.3 - 19.5  RA volume, ES, A/L                      72.8  ml     ----------  RA volume/bsa, ES, A/L                  25.7  ml/m^2 ----------    Systemic veins                          Value        Reference  Estimated CVP                           3     mm Hg  ----------    Right ventricle                         Value        Reference  TAPSE  22    mm     ----------  RV pressure, S, DP                      25    mm Hg  <=30  RV s&', lateral, S                       12.9  cm/s   ----------  Legend: (L)  and  (H)  mark values outside specified reference range.  ------------------------------------------------------------------- Prepared and Electronically Authenticated by  Sanda Klein, MD 2018-04-05T14:52:39   ECHO  04/20/2015   LV EF: 60% - 65%  ------------------------------------------------------------------- Indications: Aortic stenosis 424.1.  ------------------------------------------------------------------- History: PMH: AAA. Ascending Aortic Aneurysm 51 mm. Risk factors: Hypertension. Dyslipidemia.  ------------------------------------------------------------------- Study Conclusions  - Left ventricle: The cavity size was normal. Wall thickness was  increased in a pattern of mild LVH. Systolic function was normal.  The estimated ejection fraction was in the range of 60% to 65%.  Wall motion was normal; there were no regional wall motion  abnormalities. Features are consistent with a pseudonormal left  ventricular filling pattern, with concomitant abnormal relaxation  and increased filling pressure (grade 2 diastolic dysfunction). - Aortic valve: Mildly to moderately calcified annulus. There was  mild stenosis. There was mild regurgitation. - Right atrium: The atrium was mildly dilated.  Impressions:  - The echo is technically difficult.  Transthoracic echocardiography. M-mode, complete  2D, spectral Doppler, and color Doppler. Birthdate: Patient birthdate: 04/10/1955. Age: Patient is 62 yr old. Sex: Gender: male. BMI: 36.5 kg/m^2. Blood pressure: 138/80 Patient status: Inpatient. Study date: Study date: 04/20/2015. Study time: 02:47 PM. Location: Echo laboratory.  -------------------------------------------------------------------  ------------------------------------------------------------------- Left ventricle: The cavity size was normal. Wall thickness was increased in a pattern of mild LVH. Systolic function was normal. The estimated ejection fraction was in the range of 60% to 65%. Wall motion was normal; there were no regional wall motion abnormalities. Features are consistent with a pseudonormal left ventricular filling pattern, with concomitant abnormal relaxation and increased filling pressure (grade 2 diastolic dysfunction).  ------------------------------------------------------------------- Aortic valve: Mildly to moderately calcified annulus. Doppler: There was mild stenosis. There was mild regurgitation.  ------------------------------------------------------------------- Aorta: Aortic root: The aortic root was normal in size. Ascending aorta: The ascending aorta was normal in size.  ------------------------------------------------------------------- Mitral valve: Mildly thickened, mildly calcified leaflets . Doppler: There was no significant regurgitation. Peak gradient (D): 3 mm Hg.  ------------------------------------------------------------------- Left atrium: The atrium was normal in size.  ------------------------------------------------------------------- Right ventricle: The cavity size was normal. Systolic function was normal.  ------------------------------------------------------------------- Pulmonic valve: The valve appears to be grossly normal. Doppler: There was no significant  regurgitation.  ------------------------------------------------------------------- Tricuspid valve: The valve appears to be grossly normal. Doppler: There was trivial regurgitation.  ------------------------------------------------------------------- Right atrium: The atrium was mildly dilated.  ------------------------------------------------------------------- Pericardium: There was no pericardial effusion.   ECHO 2014: Study Conclusions  - Left ventricle: The cavity size was at the upper limits of normal. There was mild-moderate concentric hypertrophy. Systolic function was normal. The estimated ejection fraction was in the range of 55% to 60%. Wall motion was normal; there were no regional wall motion abnormalities. Doppler parameters are consistent with abnormal left ventricular relaxation (grade 1 diastolic dysfunction). - Aortic valve: Mild regurgitation. Valve area: 2.05cm^2(VTI). Valve area: 2.09cm^2 (Vmax). - Mitral valve: Mild regurgitation. Valve area by continuity equation (using LVOT flow): 2.46cm^2. - Left atrium: The atrium was mildly dilated. - Right atrium:  The atrium was mildly dilated. Transthoracic echocardiography. M-mode, complete 2D, spectral Doppler, and color Doppler. Height: Height: 193cm. Height: 76in. Weight: Weight: 136.1kg. Weight: 299.4lb. Body mass index: BMI: 36.5kg/m^2. Body surface area:  BSA: 2.39m^2. Blood pressure:   158/90. Patient status: Outpatient. Location: Echo laboratory.  ------------------------------------------------------------  ------------------------------------------------------------ Left ventricle: The cavity size was at the upper limits of normal. There was mild-moderate concentric hypertrophy. Systolic function was normal. The estimated ejection fraction was in the range of 55% to 60%. Wall motion was normal; there were no regional wall motion abnormalities. Doppler  parameters are consistent with abnormal left ventricular relaxation (grade 1 diastolic dysfunction).  ------------------------------------------------------------ Aortic valve:  Trileaflet; mildly thickened leaflets. Doppler: Transvalvular velocity was within the normal range. There was no stenosis. Mild regurgitation.  VTI ratio of LVOT to aortic valve: 0.55. Valve area: 2.05cm^2(VTI). Indexed valve area: 0.78cm^2/m^2 (VTI). Peak velocity ratio of LVOT to aortic valve: 0.55. Valve area: 2.09cm^2 (Vmax). Indexed valve area: 0.79cm^2/m^2 (Vmax). Mean gradient: 91mm Hg (S). Peak gradient: 54mm Hg (S).  ------------------------------------------------------------ Aorta: The aorta was normal, not dilated, and non-diseased.  ------------------------------------------------------------ Mitral valve: No echocardiographic evidence for prolapse. Doppler:  Mild regurgitation.  Valve area by pressure half-time: 3.38cm^2. Indexed valve area by pressure half-time: 1.29cm^2/m^2. Valve area by continuity equation (using LVOT flow): 2.46cm^2. Indexed valve area by continuity equation (using LVOT flow): 0.94cm^2/m^2.  Mean gradient: 75mm Hg (D). Peak gradient: 24mm Hg (D).  ------------------------------------------------------------ Left atrium: LA Volume/BSA 34.2 ml/m2. The atrium was mildly dilated.  ------------------------------------------------------------ Atrial septum: Poorly visualized.  ------------------------------------------------------------ Right ventricle: The cavity size was normal. Wall thickness was normal. The moderator band was in a normal position and normal-sized, without evidence of outflow obstruction. Systolic function was normal. Systolic pressure was within the normal range.  ------------------------------------------------------------ Pulmonic valve:  The valve appears to be grossly normal. Doppler:  No significant  regurgitation.  ------------------------------------------------------------ Tricuspid valve:  Structurally normal valve.  Leaflet separation was normal. Doppler: Transvalvular velocity was within the normal range. Mild regurgitation.  ------------------------------------------------------------ Right atrium: The atrium was mildly dilated.  ------------------------------------------------------------ Pericardium: The pericardium was normal in appearance. There was no pericardial effusion.  ------------------------------------------------------------ Systemic veins: Inferior vena cava: The vessel was normal in size; the respirophasic diameter changes were in the normal range (= 50%); findings are consistent with normal central venous pressure.  ------------------------------------------------------------ Post procedure conclusions Ascending Aorta:  - The aorta was normal, not dilated, and non-diseased.    Recent Lab Findings: Lab Results  Component Value Date   WBC 5.3 02/18/2016   HGB 13.5 02/18/2016   HCT 39.4 02/18/2016   PLT 199 02/18/2016   GLUCOSE 145 (H) 02/25/2016   ALT 26 09/28/2012   AST 17 09/28/2012   NA 139 02/25/2016   K 4.3 02/25/2016   CL 108 02/25/2016   CREATININE 0.90 02/25/2016   BUN 14 02/25/2016   CO2 25 02/25/2016   INR 1.01 03/05/2014   HGBA1C 5.6 02/18/2016   Aortic Size Index=     4.8   /Body surface area is 2.8 meters squared. = 1.78  < 2.75 cm/m2      4% risk per year 2.75 to 4.25          8% risk per year > 4.25 cm/m2    20% risk per year  Cross-sectional area to height ratio=9.4    Assessment / Plan: Coronary artery calcifications suggesting coronary artery disease.  4.8 cm ascending thoracic aortic aneurysm is noted. This is not significantly changed compared to prior  exam- mild to moderate Aortic regurgitation with a trileaflet aortic valve  I have again reviewed with the patient diagnosis of mild to moderate aortic  regurgitation and dilatation of the ascending aorta-at this point the patient is asymptomatic and the size of the aorta does not warrant elective repair.  He is aware of the need to continue with close follow-up concerning the size of his aorta and also the degree of aortic insufficiency.  I'll plan to see him back with a repeat CTA of the chest  in 8  months   Patient told  It's best to avoid activities that cause grunting or straining   Patient was warned about not using Cipro and similar antibiotics. Recent studies have raised concern that fluoroquinolone antibiotics could be associated with an increased risk of aortic aneurysm Fluoroquinolones have non-antimicrobial properties that might jeopardise the integrity of the extracellular matrix of the vascular wall In a  propensity score matched cohort study in [place], there was a 66% increased rate of aortic aneurysm or dissection associated with oral fluoroquinolone use, compared with amoxicillin use, within a 60 day risk period from start of treatment   According to the 2010 ACC/AHA guidelines, we recommend patients with thoracic aortic disease to maintain a LDL of less than 70 and a HDL of greater than 50. We recommend their blood pressure to remain less than 135/85.      Grace Isaac MD      [address] [place] [address] Office [phone]   Beeper [phone]  01/20/2017 5:01 PM

## 2017-01-20 ENCOUNTER — Encounter: Payer: Self-pay | Admitting: Cardiothoracic Surgery

## 2017-01-20 ENCOUNTER — Other Ambulatory Visit: Payer: Self-pay

## 2017-01-20 ENCOUNTER — Ambulatory Visit (INDEPENDENT_AMBULATORY_CARE_PROVIDER_SITE_OTHER): Payer: BC Managed Care – PPO | Admitting: Cardiothoracic Surgery

## 2017-01-20 VITALS — BP 137/89 | HR 66 | Resp 18 | Ht 76.5 in | Wt 320.8 lb

## 2017-01-20 DIAGNOSIS — I712 Thoracic aortic aneurysm, without rupture, unspecified: Secondary | ICD-10-CM

## 2017-01-20 NOTE — Patient Instructions (Signed)

## 2017-08-24 ENCOUNTER — Other Ambulatory Visit: Payer: Self-pay | Admitting: Cardiothoracic Surgery

## 2017-08-24 DIAGNOSIS — I712 Thoracic aortic aneurysm, without rupture, unspecified: Secondary | ICD-10-CM

## 2017-08-24 NOTE — Progress Notes (Unsigned)
c 

## 2017-10-12 ENCOUNTER — Ambulatory Visit: Payer: BC Managed Care – PPO | Admitting: Cardiothoracic Surgery

## 2017-10-12 ENCOUNTER — Ambulatory Visit
Admission: RE | Admit: 2017-10-12 | Discharge: 2017-10-12 | Disposition: A | Discharge status: Home / Self Care | Payer: BC Managed Care – PPO | Source: Ambulatory Visit | Attending: Cardiothoracic Surgery | Admitting: Cardiothoracic Surgery

## 2017-10-12 DIAGNOSIS — I712 Thoracic aortic aneurysm, without rupture, unspecified: Secondary | ICD-10-CM

## 2017-10-12 MED ORDER — IOPAMIDOL (ISOVUE-370) INJECTION 76%
75.0000 mL | Freq: Once | INTRAVENOUS | Status: AC | PRN
  Administered 2017-10-12: 75 mL via INTRAVENOUS

## 2017-10-13 ENCOUNTER — Encounter: Payer: Self-pay | Admitting: Cardiothoracic Surgery

## 2017-10-13 ENCOUNTER — Ambulatory Visit: Payer: BC Managed Care – PPO | Admitting: Cardiothoracic Surgery

## 2017-10-13 VITALS — BP 121/73 | HR 67 | Resp 20 | Ht 76.5 in | Wt 305.0 lb

## 2017-10-13 DIAGNOSIS — I712 Thoracic aortic aneurysm, without rupture, unspecified: Secondary | ICD-10-CM

## 2017-10-13 NOTE — Patient Instructions (Signed)

## 2017-10-13 NOTE — Progress Notes (Signed)
[address]       [place] [address]             [phone]                    Travis Myers [place] Medical Record #[id number] Date of Birth: December 20, 1955  Referring: Travis Myers Primary Care: Travis Myers Primary Cardiologist: No primary care provider on file. Chief Complaint:    Chief Complaint  Patient presents with  . Thoracic Aortic Aneurysm    8 month f/u with CTA Chest     History of Present Illness:    Travis Myers 62 y.o. male who returns today with a follow-up CTA of the chest to evaluate his a sending aorta .  He has a known dilated a sending aorta and trileaflet aortic valve .  He was originally seen by me in oct 2016 at the request of Dr. Gwenlyn Myers for dilated ascending aorta first noted in 2011.   The dilated aorta was first noted after abnormal preop chest x-ray before hip replacement in 2011, subsequent CT scan of the chest to rule out lung mass showed multiple small pulmonary nodules which are been unchanged over the years. His ascending aorta was noted to be dilated to 4.8 cm. Subsequent follow-up CT scans of the chest have been done in 2013 2014 2015 and fall  2016. An echocardiogram was done in 2014 and repeated 04/2015 and  2018. The patient has been asymptomatic from a cardiovascular standpoint. He has no evidence of congestive heart failure. He says these had a history of a murmur since childhood. He has no known family history of connective tissue disorder. He had a grandfather on his mother's side died of" heart disease" at age " 35 , But without any other details other than he died several hours after fishing at [employer] and bringing in a large fish. His father died at age 4 with lung cancer and COPD, he did have history of open repair of abdominal aortic aneurysm at 74.    Patient does note that since last saw him he had ruptured lumbar disc which was repaired.  He is also been consciously trying to lose  weight and is down 22 pounds.  He notes with this his blood pressure has been easier to control.  Patient comes in today with a follow-up CTA of the chest  Current Activity/ Functional Status:  Patient is independent with mobility/ambulation, transfers, ADL's, IADL's.   Zubrod Score: At the time of surgery this patient's most appropriate activity status/level should be described as: [x]     0    Normal activity, no symptoms []     1    Restricted in physical strenuous activity but ambulatory, able to do out light work []     2    Ambulatory and capable of self care, unable to do work activities, up and about               >50 % of waking hours                              []     3    Only limited self care, in bed greater than 50% of waking hours []     4    Completely disabled, no self care, confined to bed or chair []   5    Moribund   Past Medical History:  Diagnosis Date  . Acute meniscal tear of knee left  . Aneurysm, ascending aorta (HCC) 4.8cm per ct 2011/ CARDIOLOGSIT--  DR Travis Myers  LOV  10-07-2011  NOTE W/ CHART AND REQUEST [facility] AND ECHO TO BE FAXED.   ASYMPTOMATIC  . Heart murmur MILD-- ASYMPTOMATIC  . Hyperlipidemia   . Hypertension   . OA (osteoarthritis) of knee left  . OSA (obstructive sleep apnea) NON-TOLERANT CPAP   no cpap  . Pneumonia   . Pre-diabetes   . Sleep apnea   . Tinnitus of both ears     Past Surgical History:  Procedure Laterality Date  . [place]  . DOPPLER ECHOCARDIOGRAPHY  10/18/2011   EF >55 %  . LUMBAR LAMINECTOMY/DECOMPRESSION MICRODISCECTOMY N/A 02/24/2016   Procedure: Bilateral Decompression L4-5, Discectomy L4-5 ;  Surgeon: Travis Day, Myers;  Location: [facility];  Service: Orthopedics;  Laterality: N/A;  . NM MYOCAR PERF WALL MOTION  10/13/2011   EF 49 %,low risk study  . TONSILLECTOMY  1965  . torn meniscus left knee surgery     . TOTAL HIP ARTHROPLASTY  06-18-2009   OA LEFT HIP  . TOTAL HIP  ARTHROPLASTY Right 03/11/2014   Procedure: RIGHT TOTAL HIP ARTHROPLASTY ANTERIOR APPROACH;  Surgeon: Travis Pole, Myers;  Location: [facility];  Service: Orthopedics;  Laterality: Right;  . TRANSTHORACIC ECHOCARDIOGRAM  09/11/2012   EF 55 to 60 %,mild aortic valve regurg,mild mitral valve regurg,lf and rt atriums mildly dilated  . VARICOSE VEIN SURGERY Left 1980  . veins removed from left leg  1994    Family History  Problem Relation Age of Onset  . Diabetes Mother   . Cancer Mother   . Heart failure Other   . Diabetes Other   . Cancer Other   . COPD Other   . Colon cancer Neg Hx   . Stomach cancer Neg Hx     Social History   Socioeconomic History  . Marital status: Married    Spouse name: Not on file  . Number of children: Not on file  . Years of education: Not on file  . Highest education level: Not on file  Occupational History  . Not on file  Social Needs  . Financial resource strain: Not on file  . Food insecurity:    Worry: Not on file    Inability: Not on file  . Transportation needs:    Medical: Not on file    Non-medical: Not on file  Tobacco Use  . Smoking status: Never Smoker  . Smokeless tobacco: Former Network engineer and Sexual Activity  . Alcohol use: Yes    Alcohol/week: 1.0 standard drinks    Types: 1 Standard drinks or equivalent per week    Comment: RARE  . Drug use: No    Social History   Tobacco Use  Smoking Status Never Smoker  Smokeless Tobacco Former Systems developer    Social History   Substance and Sexual Activity  Alcohol Use Yes  . Alcohol/week: 1.0 standard drinks  . Types: 1 Standard drinks or equivalent per week   Comment: RARE     Allergies  Allergen Reactions  . Penicillins Other (See Comments)    Childhood reaction.  Has patient had a PCN reaction causing immediate rash, facial/tongue/throat swelling, SOB or lightheadedness with hypotension:unsure Has patient had a PCN reaction causing severe rash involving mucus membranes or skin  necrosis:unsure  Has patient had a PCN reaction that required hospitalization:unsure Has patient had a PCN reaction occurring within the last 10 years:No If all of the above answers are "NO", then may proceed with Cephalosporin use.     Current Outpatient Medications  Medication Sig Dispense Refill  . amLODipine (NORVASC) 10 MG tablet Take 1 tablet (10 mg total) daily by mouth. 90 tablet 3  . aspirin EC 81 MG tablet Take 1 tablet (81 mg total) by mouth daily. Resume 4 days post-op    . ATORVASTATIN CALCIUM PO Take 1 tablet by mouth daily.    . Dermatological Products, Misc. (MOISTURE) CREA Apply 1-2 application topically 2 (two) times daily as needed (for dry skin). [employer] Emergency planning/management officer    . furosemide (LASIX) 20 MG tablet Take 40 mg by mouth daily.  5  . ibuprofen (ADVIL,MOTRIN) 800 MG tablet Take 1 tablet (800 mg total) by mouth 3 (three) times daily. Resume 5 days post-op as needed 30 tablet 0  . lisinopril (PRINIVIL,ZESTRIL) 40 MG tablet TAKE 1 TABLET BY MOUTH EVERY Myers FOR BLOOD PRESSURE  5  . meclizine (ANTIVERT) 25 MG tablet Take 25 mg by mouth daily.  0  . traMADol (ULTRAM) 50 MG tablet Take 1 tablet by mouth as directed.  2   No current facility-administered medications for this visit.     Review of Systems:  Review of Systems  Constitutional: Negative.   HENT: Negative.   Eyes: Negative.   Respiratory: Negative.   Cardiovascular: Positive for leg swelling. Negative for chest pain, palpitations, orthopnea, claudication and PND.  Gastrointestinal: Negative.   Genitourinary: Negative.   Musculoskeletal: Positive for back pain and joint pain.  Skin: Negative.   Neurological: Negative.   Endo/Heme/Allergies: Negative.   Psychiatric/Behavioral: Negative.       Physical Exam: BP 121/73   Pulse 67   Resp 20   Ht 6' 4.5" (1.943 m)   Wt (!) 305 lb (138.3 kg)   SpO2 98% Comment: RA  BMI 36.64 kg/m   PHYSICAL EXAMINATION: General appearance: alert, cooperative and no  distress Head: Normocephalic, without obvious abnormality, atraumatic Neck: no adenopathy, no carotid bruit, no JVD, supple, symmetrical, trachea midline and thyroid not enlarged, symmetric, no tenderness/mass/nodules Lymph nodes: Cervical, supraclavicular, and axillary nodes normal. Resp: clear to auscultation bilaterally Cardio: regular rate and rhythm, S1, S2 normal, no murmur, click, rub or gallop GI: soft, non-tender; bowel sounds normal; no masses,  no organomegaly Extremities: extremities normal, atraumatic, no cyanosis or edema and Homans sign is negative, no sign of DVT Neurologic: Grossly normal    Diagnostic Studies & Laboratory data:     Recent Radiology Findings:  Ct Angio Chest Aorta W/cm &/or Wo/cm  Result Date: 10/12/2017 CLINICAL DATA:  Thoracic aortic aneurysm without rupture. EXAM: CT ANGIOGRAPHY CHEST WITH CONTRAST TECHNIQUE: Multidetector CT imaging of the chest was performed using the standard protocol during bolus administration of intravenous contrast. Multiplanar CT image reconstructions and MIPs were obtained to evaluate the vascular anatomy. CONTRAST:  76mL ISOVUE-370 IOPAMIDOL (ISOVUE-370) INJECTION 76% COMPARISON:  CT scan of December 29, 2016. FINDINGS: Cardiovascular: 4.9 cm ascending thoracic aortic aneurysm is noted which is not significantly changed compared to prior exam. No dissection is noted. Transverse aortic arch measures 2.9 cm. Proximal descending thoracic aorta measures 2.8 cm. Great vessels are widely patent without significant stenosis. Normal cardiac size. No pericardial effusion is noted. Mild coronary artery calcifications are noted. Mediastinum/Nodes: No enlarged mediastinal, hilar, or axillary lymph nodes. Thyroid gland, trachea, and  esophagus demonstrate no significant findings. Lungs/Pleura: Lungs are clear. No pleural effusion or pneumothorax. Upper Abdomen: No acute abnormality. Musculoskeletal: No chest wall abnormality. No acute or significant  osseous findings. Review of the MIP images confirms the above findings. IMPRESSION: Grossly stable 4.9 cm ascending thoracic aorta aneurysm. Recommend semi-annual imaging followup by CTA or MRA and referral to cardiothoracic surgery if not already obtained. This recommendation follows 2010 ACCF/AHA/AATS/ACR/ASA/SCA/SCAI/SIR/STS/SVM Guidelines for the Diagnosis and Management of Patients With Thoracic Aortic Disease. Circulation. 2010; 121: H962-I297. Mild coronary artery calcifications are noted. Electronically Signed   By: Marijo Conception, M.D.   On: 10/12/2017 12:46   I have independently reviewed the above radiology studies  and reviewed the findings with the patient.     Ct Angio Chest Aorta W/cm &/or Wo/cm  Result Date: 04/14/2016 CLINICAL DATA:  Thoracic aortic aneurysm without rupture. EXAM: CT ANGIOGRAPHY CHEST WITH CONTRAST TECHNIQUE: Multidetector CT imaging of the chest was performed using the standard protocol during bolus administration of intravenous contrast. Multiplanar CT image reconstructions and MIPs were obtained to evaluate the vascular anatomy. CONTRAST:  75 mL of Isovue 370 intravenously. COMPARISON:  CT scan of April 23, 2015. FINDINGS: Cardiovascular: Stable 4.8 cm ascending thoracic aortic aneurysm is noted. Great vessels are widely patent without significant stenosis. Transverse aortic arch measures 2.9 cm. Proximal descending thoracic aorta measures 2.8 cm. Coronary artery calcifications are noted. Normal cardiac size. No pericardial effusion is noted. Mediastinum/Nodes: No enlarged mediastinal, hilar, or axillary lymph nodes. Thyroid gland, trachea, and esophagus demonstrate no significant findings. Lungs/Pleura: Lungs are clear. No pleural effusion or pneumothorax. Upper Abdomen: No acute abnormality. Musculoskeletal: No chest wall abnormality. No acute or significant osseous findings. Review of the MIP images confirms the above findings. IMPRESSION: Stable 4.8 cm ascending  thoracic aortic aneurysm. Ascending thoracic aortic aneurysm. Recommend semi-annual imaging followup by CTA or MRA and referral to cardiothoracic surgery if not already obtained. This recommendation follows 2010 ACCF/AHA/AATS/ACR/ASA/SCA/SCAI/SIR/STS/SVM Guidelines for the Diagnosis and Management of Patients With Thoracic Aortic Disease. Circulation. 2010; 121: L892-J194. Coronary artery calcifications suggesting coronary artery disease. Electronically Signed   By: Marijo Conception, M.D.   On: 04/14/2016 14:04    Ct Angio Chest Aorta W/cm &/or Wo/cm  04/23/2015  CLINICAL DATA:  Thoracic aortic aneurysm without rupture. EXAM: CT ANGIOGRAPHY CHEST WITH CONTRAST TECHNIQUE: Multidetector CT imaging of the chest was performed using the standard protocol during bolus administration of intravenous contrast. Multiplanar CT image reconstructions and MIPs were obtained to evaluate the vascular anatomy. CONTRAST:  75 mL of Isovue 370 intravenously. COMPARISON:  CT scan of October 02, 2014. FINDINGS: No pneumothorax or pleural effusion is noted. No acute pulmonary disease is noted. Coronary artery calcifications are noted. Aortic root measures 3.3 cm in diameter. Ascending thoracic aorta has a maximum measured diameter 4.8 cm which is not significantly changed compared to prior exam based on my own measurement. Transverse aortic arch measures 2.9 cm in diameter. Descending thoracic aorta measures 2.6 cm in diameter. No dissection is noted. No mediastinal mass or adenopathy is noted. Visualized portion of upper abdomen is unremarkable. No significant osseous abnormality is noted. Review of the MIP images confirms the above findings. IMPRESSION: Coronary artery calcifications suggesting coronary artery disease. 4.8 cm ascending thoracic aortic aneurysm is noted. This is not significantly changed compared to prior exam. Recommend semi-annual imaging followup by CTA or MRA and referral to cardiothoracic surgery if not already  obtained. This recommendation follows 2010 ACCF/AHA/AATS/ACR/ASA/SCA/SCAI/SIR/STS/SVM Guidelines for the Diagnosis and  Management of Patients With Thoracic Aortic Disease. Circulation. 2010; 121: S505-L976. Electronically Signed   By: Marijo Conception, M.D.   On: 04/23/2015 14:31  Ct Angio Chest Aorta W/cm &/or Wo/cm  10/02/2014   CLINICAL DATA:  History of thoracic aortic aneurysm 3 years, followup  EXAM: CT ANGIOGRAPHY CHEST WITH CONTRAST  TECHNIQUE: Multidetector CT imaging of the chest was performed using the standard protocol during bolus administration of intravenous contrast. Multiplanar CT image reconstructions and MIPs were obtained to evaluate the vascular anatomy.  CONTRAST:  75 cc Isovue 370  COMPARISON:  CT angio chest of 10/04/2013  FINDINGS: There is again fusiform dilatation of the ascending aorta noted. Measured in the same planes as measured previously, the mid thoracic ascending aorta measures 4.8 x 5.0 cm compared to 4.8 x 4.9 cm previously. Cardiothoracic surgery consultation recommended due to increased risk of rupture for arch aneurysm ? 5.5 cm. This recommendation follows 2010 ACCF/AHA/AATS/ACR/ASA/SCA/SCAI/SIR/STS/SVM Guidelines for the Diagnosis and Management of Patients With Thoracic Aortic Disease. Circulation. 2010; 121: B341-P379. The mid thoracic aortic arch measures 2.7 cm. The mid descending thoracic aorta measured at the same level as the ascending aorta measures 2.6 cm. The distal thoracic aorta just above the diaphragmatic screw measures 2.7 cm. These measurements appear unchanged.  No mediastinal or hilar adenopathy is seen. The pulmonary arteries are not well opacified cannot and cannot be assessed. Extensive coronary artery calcifications are present primarily involving the left anterior descending artery and circumflex artery. Mild cardiomegaly is stable. 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 Correlation  with surgical history is recommended. There are degenerative changes throughout the mid to lower thoracic spine.  On lung window images, no lung parenchymal abnormality is seen. No suspicious lung nodule is seen and there is no evidence of pleural effusion. The central airway is patent.  Review of the MIP images confirms the above findings.  IMPRESSION: 1. The mid ascending thoracic aorta measures 4.8 x 5.0 cm compared to 4.8 x 4.9 cm previously. Cardiothoracic surgery consultation recommended due to increased risk of rupture for arch aneurysm ? 5.5 cm. This recommendation follows 2010 ACCF/AHA/AATS/ACR/ASA/SCA/SCAI/SIR/STS/SVM Guidelines for the Diagnosis and Management of Patients With Thoracic Aortic Disease. Circulation. 2010; 121: K240-X735 . 2. Significant coronary artery calcifications primarily involving the left anterior descending and circumflex coronary arteries.   Electronically Signed   By: Ivar Drape M.D.   On: 10/02/2014 09:17   ECHO 04/13/2016 Echocardiography  Patient:    Kyon, Bentler MR #:       [id number] Study Date: 04/14/2016 Gender:     M Age:        33 Height:     193 cm Weight:     143.8 kg BSA:        2.83 m^2 Pt. Status: Room:   ATTENDING    Lanelle Bal Myers  ORDERING     Lanelle Bal Myers  [place] Myers  PERFORMING   [facility]  SONOGRAPHER  Mikki Santee  cc:  ------------------------------------------------------------------- LV EF: 55% -   60%  ------------------------------------------------------------------- Indications:      Aneurysm - thoracic 441.2.  ------------------------------------------------------------------- History:   PMH:  Aneurysm of the Ascending Aorta.  ------------------------------------------------------------------- Study Conclusions  - Left ventricle: The cavity size was normal. Wall thickness was   normal. Systolic function was normal. The estimated ejection   fraction was in the  range of 55%  to 60%. Wall motion was normal;   there were no regional wall motion abnormalities. Doppler   parameters are consistent with abnormal left ventricular   relaxation (grade 1 diastolic dysfunction). - Aortic valve: There was mild to moderate regurgitation directed   centrally in the LVOT. - Left atrium: The atrium was moderately dilated. - Atrial septum: No defect or patent foramen ovale was identified.  ------------------------------------------------------------------- Study data:  Comparison was made to the study of 04/20/2015.  Study status:  Routine.  Procedure:  The patient reported no pain pre or post test. Transthoracic echocardiography. Image quality was adequate.  Study completion:  There were no complications. Echocardiography.  M-mode, complete 2D, spectral Doppler, and color Doppler.  Birthdate:  Patient birthdate: 06/02/1955.  Age:  Patient is 62 yr old.  Sex:  Gender: male.    BMI: 38.6 kg/m^2.  Blood pressure:     153/87  Patient status:  Outpatient.  Study date: Study date: 04/14/2016. Study time: 08:54 AM.  Location:  Echo laboratory.  -------------------------------------------------------------------  ------------------------------------------------------------------- Left ventricle:  The cavity size was normal. Wall thickness was normal. Systolic function was normal. The estimated ejection fraction was in the range of 55% to 60%. Wall motion was normal; there were no regional wall motion abnormalities. Doppler parameters are consistent with abnormal left ventricular relaxation (grade 1 diastolic dysfunction).  ------------------------------------------------------------------- Aortic valve:   Structurally normal valve. Trileaflet. Cusp separation was normal.  Doppler:  Transvalvular velocity was within the normal range. There was no stenosis. There was mild to moderate regurgitation directed centrally in the  LVOT.  ------------------------------------------------------------------- Aorta:  The aorta was poorly visualized. Ascending aorta: The ascending aorta was moderately dilated.  ------------------------------------------------------------------- Mitral valve:   Structurally normal valve.   Leaflet separation was normal.  Doppler:  Transvalvular velocity was within the normal range. There was no evidence for stenosis. There was no regurgitation.    Peak gradient (D): 3 mm Hg.  ------------------------------------------------------------------- Left atrium:  The atrium was moderately dilated.  ------------------------------------------------------------------- Atrial septum:  No defect or patent foramen ovale was identified.   ------------------------------------------------------------------- Right ventricle:  Poorly visualized. The cavity size was normal. Systolic function was normal.  ------------------------------------------------------------------- Pulmonic valve:    Structurally normal valve.   Cusp separation was normal.  Doppler:  Transvalvular velocity was within the normal range. There was no regurgitation.  ------------------------------------------------------------------- Tricuspid valve:   Structurally normal valve.   Leaflet separation was normal.  Doppler:  Transvalvular velocity was within the normal range. There was trivial regurgitation.  ------------------------------------------------------------------- Pulmonary artery:   Systolic pressure was within the normal range.   ------------------------------------------------------------------- Right atrium:  The atrium was normal in size.  ------------------------------------------------------------------- Systemic veins: Inferior vena cava: The vessel was normal in size. The respirophasic diameter changes were in the normal range (= 50%), consistent with normal central venous  pressure.  ------------------------------------------------------------------- Post procedure conclusions Ascending Aorta:  - The aorta was poorly visualized.  ------------------------------------------------------------------- Measurements   Left ventricle                          Value        Reference  LV ID, ED, PLAX chordal          (H)    53.5  mm     43 - 52  LV ID, ES, PLAX chordal                 32.1  mm     23 -  38  LV fx shortening, PLAX chordal          40    %      >=29  LV PW thickness, ED                     11.3  mm     ----------  IVS/LV PW ratio, ED                     1.13         <=1.3  Stroke volume, 2D                       134   ml     ----------  Stroke volume/bsa, 2D                   47    ml/m^2 ----------  LV e&', lateral                          6.64  cm/s   ----------  LV E/e&', lateral                        12.68        ----------  LV e&', medial                           7.4   cm/s   ----------  LV E/e&', medial                         11.38        ----------  LV e&', average                          7.02  cm/s   ----------  LV E/e&', average                        11.99        ----------  Longitudinal strain, TDI                18    %      ----------    Ventricular septum                      Value        Reference  IVS thickness, ED                       12.8  mm     ----------    LVOT                                    Value        Reference  LVOT ID, S                              23    mm     ----------  LVOT area                               4.15  cm^2   ----------  LVOT peak velocity, S                   124   cm/s   ----------  LVOT mean velocity, S                   82.3  cm/s   ----------  LVOT VTI, S                             32.3  cm     ----------  LVOT peak gradient, S                   6     mm Hg  ----------    Aortic valve                            Value        Reference  Aortic regurg pressure half-time        816    ms     ----------    Aorta                                   Value        Reference  Aortic root ID, ED                      26    mm     ----------  Ascending aorta ID, A-P, mid, ED (H)    47.4  mm     21 - 34    Left atrium                             Value        Reference  LA ID, A-P, ES                          45    mm     ----------  LA ID/bsa, A-P                          1.59  cm/m^2 <=2.2  LA volume, ES, 1-p A4C                  49.8  ml     ----------  LA volume/bsa, ES, 1-p A4C              17.6  ml/m^2 ----------  LA volume, ES, 1-p A2C                  81.6  ml     ----------  LA volume/bsa, ES, 1-p A2C              28.9  ml/m^2 ----------    Mitral valve                            Value        Reference  Mitral E-wave peak velocity             84.2  cm/s   ----------  Mitral A-wave peak velocity  90.6  cm/s   ----------  Mitral deceleration time                215   ms     150 - 230  Mitral peak gradient, D                 3     mm Hg  ----------  Mitral E/A ratio, peak                  0.9          ----------    Pulmonary arteries                      Value        Reference  PA pressure, S, DP                      25    mm Hg  <=30    Tricuspid valve                         Value        Reference  Tricuspid regurg peak velocity          235   cm/s   ----------  Tricuspid peak RV-RA gradient           22    mm Hg  ----------    Right atrium                            Value        Reference  RA ID, S-I, ES, A4C              (H)    67.8  mm     34 - 49  RA area, ES, A4C                 (H)    24.3  cm^2   8.3 - 19.5  RA volume, ES, A/L                      72.8  ml     ----------  RA volume/bsa, ES, A/L                  25.7  ml/m^2 ----------    Systemic veins                          Value        Reference  Estimated CVP                           3     mm Hg  ----------    Right ventricle                         Value        Reference  TAPSE                                    22    mm     ----------  RV pressure, S, DP  25    mm Hg  <=30  RV s&', lateral, S                       12.9  cm/s   ----------  Legend: (L)  and  (H)  mark values outside specified reference range.  ------------------------------------------------------------------- Prepared and Electronically Authenticated by  Sanda Klein, Myers 2018-04-05T14:52:39    Recent Lab Findings: Lab Results  Component Value Date   WBC 5.3 02/18/2016   HGB 13.5 02/18/2016   HCT 39.4 02/18/2016   PLT 199 02/18/2016   GLUCOSE 145 (H) 02/25/2016   ALT 26 09/28/2012   AST 17 09/28/2012   NA 139 02/25/2016   K 4.3 02/25/2016   CL 108 02/25/2016   CREATININE 0.90 02/25/2016   BUN 14 02/25/2016   CO2 25 02/25/2016   INR 1.01 03/05/2014   HGBA1C 5.6 02/18/2016   Aortic Size Index=     4.9 /Body surface area is 2.73 meters squared. = 1.79  < 2.75 cm/m2      4% risk per year 2.75 to 4.25          8% risk per year > 4.25 cm/m2    20% risk per year  Cross-sectional area to height ratio=9.4    Assessment / Plan: Coronary artery calcifications suggesting coronary artery disease-without symptoms of angina  4.9 cm ascending thoracic aortic aneurysm is noted. This is not significantly changed compared to prior exam- mild to moderate Aortic regurgitation with a trileaflet aortic valve  I have again reviewed with the patient diagnosis of mild to moderate aortic regurgitation and dilatation of the ascending aorta-at this point the patient is asymptomatic and the size of the aorta does not warrant elective repair.  He is aware of the need to continue with close follow-up concerning the size of his aorta and also the degree of aortic insufficiency.  I'll plan to see him back with a repeat CTA of the chest and echocardiogram in 8  months   Patient told  It's best to avoid activities that cause grunting or straining   Patient was warned about not using Cipro and  similar antibiotics. Recent studies have raised concern that fluoroquinolone antibiotics could be associated with an increased risk of aortic aneurysm Fluoroquinolones have non-antimicrobial properties that might jeopardise the integrity of the extracellular matrix of the vascular wall In a  propensity score matched cohort study in [place], there was a 66% increased rate of aortic aneurysm or dissection associated with oral fluoroquinolone use, compared with amoxicillin use, within a 60 Myers risk period from start of treatment   According to the 2010 ACC/AHA guidelines, we recommend patients with thoracic aortic disease to maintain a LDL of less than 70 and a HDL of greater than 50. We recommend their blood pressure to remain less than 135/85.      Grace Isaac Myers      [address] [place] [address] Office [phone]   Beeper [phone]  10/13/2017 3:15 PM

## 2018-04-11 ENCOUNTER — Other Ambulatory Visit: Payer: Self-pay | Admitting: Cardiothoracic Surgery

## 2018-04-11 DIAGNOSIS — I712 Thoracic aortic aneurysm, without rupture, unspecified: Secondary | ICD-10-CM

## 2018-04-11 DIAGNOSIS — I359 Nonrheumatic aortic valve disorder, unspecified: Secondary | ICD-10-CM

## 2018-06-26 ENCOUNTER — Other Ambulatory Visit (HOSPITAL_COMMUNITY): Payer: BC Managed Care – PPO

## 2018-06-27 ENCOUNTER — Telehealth (HOSPITAL_COMMUNITY): Payer: Self-pay | Admitting: *Deleted

## 2018-06-27 NOTE — Telephone Encounter (Signed)

## 2018-06-28 ENCOUNTER — Other Ambulatory Visit (HOSPITAL_COMMUNITY): Payer: BC Managed Care – PPO

## 2018-07-02 ENCOUNTER — Telehealth (HOSPITAL_COMMUNITY): Payer: Self-pay

## 2018-07-02 NOTE — Telephone Encounter (Signed)

## 2018-07-03 ENCOUNTER — Other Ambulatory Visit: Payer: Self-pay

## 2018-07-03 ENCOUNTER — Ambulatory Visit (HOSPITAL_COMMUNITY): Payer: BC Managed Care – PPO | Attending: Internal Medicine

## 2018-07-03 DIAGNOSIS — I359 Nonrheumatic aortic valve disorder, unspecified: Secondary | ICD-10-CM | POA: Diagnosis not present

## 2018-07-04 ENCOUNTER — Other Ambulatory Visit: Payer: Self-pay

## 2018-07-05 ENCOUNTER — Ambulatory Visit
Admission: RE | Admit: 2018-07-05 | Discharge: 2018-07-05 | Disposition: A | Discharge status: Home / Self Care | Payer: BC Managed Care – PPO | Source: Ambulatory Visit | Attending: Cardiothoracic Surgery | Admitting: Cardiothoracic Surgery

## 2018-07-05 ENCOUNTER — Encounter: Payer: Self-pay | Admitting: Cardiothoracic Surgery

## 2018-07-05 ENCOUNTER — Ambulatory Visit (INDEPENDENT_AMBULATORY_CARE_PROVIDER_SITE_OTHER): Payer: BC Managed Care – PPO | Admitting: Cardiothoracic Surgery

## 2018-07-05 VITALS — BP 122/75 | HR 74 | Temp 96.7°F | Resp 18 | Ht 76.5 in | Wt 308.0 lb

## 2018-07-05 DIAGNOSIS — I712 Thoracic aortic aneurysm, without rupture, unspecified: Secondary | ICD-10-CM

## 2018-07-05 MED ORDER — IOPAMIDOL (ISOVUE-370) INJECTION 76%
75.0000 mL | Freq: Once | INTRAVENOUS | Status: AC | PRN
  Administered 2018-07-05: 75 mL via INTRAVENOUS

## 2018-07-05 NOTE — Progress Notes (Signed)
[address] [address]       Travis Myers,Travis Myers [id number]             [phone]                    Travis Myers [place] [facility] Medical Record #[id number] Date of Birth: 06-09-1955  Referring: [name], MD Primary Care: Cyndi Bender, PA-C Primary Cardiologist: No primary care provider on file. Chief Complaint:    Chief Complaint  Patient presents with   Thoracic Aortic Aneurysm    f/u with CTA 07/05/2018 and ECHO 07/03/2018    History of Present Illness:    Travis Myers 63 y.o. male who returns today with a follow-up CTA of the chest to evaluate his a sending aorta .  He has a known dilated a sending aorta and trileaflet aortic valve .  He was originally seen by me in oct 2016 at the request of Dr. Gwenlyn Found for dilated ascending aorta first noted in 2011.   The dilated aorta was first noted after abnormal preop chest x-ray before hip replacement in 2011, subsequent CT scan of the chest to rule out lung mass showed multiple small pulmonary nodules which are been unchanged over the years. His ascending aorta was noted to be dilated to 4.8 cm. Subsequent follow-up CT scans of the chest have been done in 2013 2014 2015 and fall  2016. An echocardiogram was done in 2014 and repeated 04/2015 and  2018. The patient has been asymptomatic from a cardiovascular standpoint. He has no evidence of congestive heart failure. He says these had a history of a murmur since childhood. He has no known family history of connective tissue disorder. He had a grandfather on his mother's side died of" heart disease" at age " 59 , But without any other details other than he died several hours after fishing at [employer] and bringing in a large fish. His father died at age 5 with lung cancer and COPD, he did have history of open repair of abdominal aortic aneurysm at 3.    Patient does note that since last saw him he had ruptured lumbar disc which was repaired.  He is also been consciously  trying to lose weight and is down 22 pounds.  He notes with this his blood pressure has been easier to control.  Patient comes in today with a follow-up CTA of the chest  Current Activity/ Functional Status:  Patient is independent with mobility/ambulation, transfers, ADL's, IADL's.   Zubrod Score: At the time of surgery this patients most appropriate activity status/level should be described as: [x]     0    Normal activity, no symptoms []     1    Restricted in physical strenuous activity but ambulatory, able to do out light work []     2    Ambulatory and capable of self care, unable to do work activities, up and about               >50 % of waking hours                              []     3    Only limited self care, in bed greater than 50% of waking hours []     4    Completely disabled, no self care, confined to bed or chair []   5    Moribund   Past Medical History:  Diagnosis Date   Acute meniscal tear of knee left   Aneurysm, ascending aorta ([place]) 4.8cm per ct 2011/ CARDIOLOGSIT--  DR Rollene Fare  LOV  10-07-2011  NOTE W/ CHART AND REQUEST LEXISCAN MYOVIEW AND ECHO TO BE FAXED.   ASYMPTOMATIC   Heart murmur MILD-- ASYMPTOMATIC   Hyperlipidemia    Hypertension    OA (osteoarthritis) of knee left   OSA (obstructive sleep apnea) NON-TOLERANT CPAP   no cpap   Pneumonia    Pre-diabetes    Sleep apnea    Tinnitus of both ears     Past Surgical History:  Procedure Laterality Date   BILATERAL IINGUINAL HERNIA REPAIR  1963   DOPPLER ECHOCARDIOGRAPHY  10/18/2011   EF >55 %   LUMBAR LAMINECTOMY/DECOMPRESSION MICRODISCECTOMY N/A 02/24/2016   Procedure: Bilateral Decompression L4-5, Discectomy L4-5 ;  Surgeon: Susa Day, MD;  Location: [facility];  Service: Orthopedics;  Laterality: N/A;   NM MYOCAR PERF WALL MOTION  10/13/2011   EF 49 %,low risk study   TONSILLECTOMY  1965   torn meniscus left knee surgery      TOTAL HIP ARTHROPLASTY  06-18-2009   OA LEFT HIP     TOTAL HIP ARTHROPLASTY Right 03/11/2014   Procedure: RIGHT TOTAL HIP ARTHROPLASTY ANTERIOR APPROACH;  Surgeon: [name], MD;  Location: [facility];  Service: Orthopedics;  Laterality: Right;   TRANSTHORACIC ECHOCARDIOGRAM  09/11/2012   EF 55 to 60 %,mild aortic valve regurg,mild mitral valve regurg,lf and rt atriums mildly dilated   VARICOSE VEIN SURGERY Left 1980   veins removed from left leg  1994    Family History  Problem Relation Age of Onset   Diabetes Mother    Cancer Mother    Heart failure Other    Diabetes Other    Cancer Other    COPD Other    Colon cancer Neg Hx    Stomach cancer Neg Hx     Social History   Socioeconomic History   Marital status: Married    Spouse name: Not on file   Number of children: Not on file   Years of education: Not on file   Highest education level: Not on file  Occupational History   Not on file  Social Needs   Financial resource strain: Not on file   Food insecurity:    Worry: Not on file    Inability: Not on file   Transportation needs:    Medical: Not on file    Non-medical: Not on file  Tobacco Use   Smoking status: Never Smoker   Smokeless tobacco: Former Systems developer  Substance and Sexual Activity   Alcohol use: Yes    Alcohol/week: 1.0 standard drinks    Types: 1 Standard drinks or equivalent per week    Comment: RARE   Drug use: No    Social History   Tobacco Use  Smoking Status Never Smoker  Smokeless Tobacco Former Systems developer    Social History   Substance and Sexual Activity  Alcohol Use Yes   Alcohol/week: 1.0 standard drinks   Types: 1 Standard drinks or equivalent per week   Comment: RARE     Allergies  Allergen Reactions   Penicillins Other (See Comments)    Childhood reaction.  Has patient had a PCN reaction causing immediate rash, facial/tongue/throat swelling, SOB or lightheadedness with hypotension:unsure Has patient had a PCN reaction causing severe rash involving mucus  membranes or skin  necrosis:unsure Has patient had a PCN reaction that required hospitalization:unsure Has patient had a PCN reaction occurring within the last 10 years:No If all of the above answers are "NO", then may proceed with Cephalosporin use.    Quinolones     Patient was warned about not using Cipro and similar antibiotics. Recent studies have raised concern that fluoroquinolone antibiotics could be associated with an increased risk of aortic aneurysm Fluoroquinolones have non-antimicrobial properties that might jeopardise the integrity of the extracellular matrix of the vascular wall In a  propensity score matched cohort study in [place], there was a 66% increased rate of aortic aneurysm or dissection associated with oral fluoroquinolone use, compared wit    Current Outpatient Medications  Medication Sig Dispense Refill   aspirin EC 81 MG tablet Take 1 tablet (81 mg total) by mouth daily. Resume 4 days post-op     ATORVASTATIN CALCIUM PO Take 1 tablet by mouth daily.     Dermatological Products, Misc. (MOISTURE) CREA Apply 1-2 application topically 2 (two) times daily as needed (for dry skin). [employer]     furosemide (LASIX) 20 MG tablet Take 40 mg by mouth daily.  5   lisinopril (PRINIVIL,ZESTRIL) 40 MG tablet TAKE 1 TABLET BY MOUTH EVERY DAY FOR BLOOD PRESSURE  5   meclizine (ANTIVERT) 25 MG tablet Take 25 mg by mouth daily.  0   traMADol (ULTRAM) 50 MG tablet Take 1 tablet by mouth as directed.  2   amLODipine (NORVASC) 10 MG tablet Take 1 tablet (10 mg total) daily by mouth. 90 tablet 3   No current facility-administered medications for this visit.     Review of Systems:  ROS   Physical Exam: BP 122/75 (BP Location: Left Arm, Patient Position: Sitting, Cuff Size: Large)    Pulse 74    Temp (!) 96.7 F (35.9 C)    Resp 18    Ht 6' 4.5" (1.943 m)    Wt (!) 308 lb (139.7 kg)    SpO2 97% Comment: RA   BMI 37.00 kg/m   PHYSICAL EXAMINATION: General  appearance: alert, cooperative and no distress Myers: Normocephalic, without obvious abnormality, atraumatic Neck: no adenopathy, no carotid bruit, no JVD, supple, symmetrical, trachea midline and thyroid not enlarged, symmetric, no tenderness/mass/nodules Lymph nodes: Cervical, supraclavicular, and axillary nodes normal. Resp: clear to auscultation bilaterally Back: symmetric, no curvature. ROM normal. No CVA tenderness. Cardio: diastolic murmur: mid diastolic 2/6, decrescendo at 2nd left intercostal space GI: soft, non-tender; bowel sounds normal; no masses,  no organomegaly Extremities: Significant bilateral lower extremity edema chronic in nature Neurologic: Grossly normal  Diagnostic Studies & Laboratory data:     Recent Radiology Findings:  Ct Angio Chest Aorta W &/or Wo Contrast  Result Date: 07/05/2018 CLINICAL DATA:  Thoracic aortic aneurysm without rupture. EXAM: CT ANGIOGRAPHY CHEST WITH CONTRAST TECHNIQUE: Multidetector CT imaging of the chest was performed using the standard protocol during bolus administration of intravenous contrast. Multiplanar CT image reconstructions and MIPs were obtained to evaluate the vascular anatomy. CONTRAST:  78mL ISOVUE-370 IOPAMIDOL (ISOVUE-370) INJECTION 76% COMPARISON:  CT scan of October 12, 2017. FINDINGS: Cardiovascular: Grossly stable 4.9 cm ascending thoracic aortic aneurysm is noted without dissection. Aortic arch measures 2.9 cm. Proximal descending thoracic aorta measures 2.8 cm. Great vessels are widely patent without significant stenosis. Normal cardiac size. No pericardial effusion is noted. Coronary artery calcifications are noted. Mediastinum/Nodes: No enlarged mediastinal, hilar, or axillary lymph nodes. Thyroid gland, trachea, and esophagus demonstrate no significant  findings. Lungs/Pleura: Lungs are clear. No pleural effusion or pneumothorax. Upper Abdomen: No acute abnormality. Musculoskeletal: No chest wall abnormality. No acute or  significant osseous findings. Review of the MIP images confirms the above findings. IMPRESSION: Grossly stable 4.9 cm ascending thoracic aortic aneurysm. Recommend semi-annual imaging followup by CTA or MRA and referral to cardiothoracic surgery if not already obtained. This recommendation follows 2010 ACCF/AHA/AATS/ACR/ASA/SCA/SCAI/SIR/STS/SVM Guidelines for the Diagnosis and Management of Patients With Thoracic Aortic Disease. Circulation. 2010; 121: B147-W295. Aortic aneurysm NOS (ICD10-I71.9). Coronary artery calcifications are noted. Aortic aneurysm NOS (ICD10-I71.9). Electronically Signed   By: Marijo Conception M.D.   On: 07/05/2018 13:52   I have independently reviewed the above radiology studies  and reviewed the findings with the patient.     Ct Angio Chest Aorta W/cm &/or Wo/cm  Result Date: 04/14/2016 CLINICAL DATA:  Thoracic aortic aneurysm without rupture. EXAM: CT ANGIOGRAPHY CHEST WITH CONTRAST TECHNIQUE: Multidetector CT imaging of the chest was performed using the standard protocol during bolus administration of intravenous contrast. Multiplanar CT image reconstructions and MIPs were obtained to evaluate the vascular anatomy. CONTRAST:  75 mL of Isovue 370 intravenously. COMPARISON:  CT scan of April 23, 2015. FINDINGS: Cardiovascular: Stable 4.8 cm ascending thoracic aortic aneurysm is noted. Great vessels are widely patent without significant stenosis. Transverse aortic arch measures 2.9 cm. Proximal descending thoracic aorta measures 2.8 cm. Coronary artery calcifications are noted. Normal cardiac size. No pericardial effusion is noted. Mediastinum/Nodes: No enlarged mediastinal, hilar, or axillary lymph nodes. Thyroid gland, trachea, and esophagus demonstrate no significant findings. Lungs/Pleura: Lungs are clear. No pleural effusion or pneumothorax. Upper Abdomen: No acute abnormality. Musculoskeletal: No chest wall abnormality. No acute or significant osseous findings. Review of the MIP  images confirms the above findings. IMPRESSION: Stable 4.8 cm ascending thoracic aortic aneurysm. Ascending thoracic aortic aneurysm. Recommend semi-annual imaging followup by CTA or MRA and referral to cardiothoracic surgery if not already obtained. This recommendation follows 2010 ACCF/AHA/AATS/ACR/ASA/SCA/SCAI/SIR/STS/SVM Guidelines for the Diagnosis and Management of Patients With Thoracic Aortic Disease. Circulation. 2010; 121: A213-Y865. Coronary artery calcifications suggesting coronary artery disease. Electronically Signed   By: Marijo Conception, M.D.   On: 04/14/2016 14:04    Ct Angio Chest Aorta W/cm &/or Wo/cm  04/23/2015  CLINICAL DATA:  Thoracic aortic aneurysm without rupture. EXAM: CT ANGIOGRAPHY CHEST WITH CONTRAST TECHNIQUE: Multidetector CT imaging of the chest was performed using the standard protocol during bolus administration of intravenous contrast. Multiplanar CT image reconstructions and MIPs were obtained to evaluate the vascular anatomy. CONTRAST:  75 mL of Isovue 370 intravenously. COMPARISON:  CT scan of October 02, 2014. FINDINGS: No pneumothorax or pleural effusion is noted. No acute pulmonary disease is noted. Coronary artery calcifications are noted. Aortic root measures 3.3 cm in diameter. Ascending thoracic aorta has a maximum measured diameter 4.8 cm which is not significantly changed compared to prior exam based on my own measurement. Transverse aortic arch measures 2.9 cm in diameter. Descending thoracic aorta measures 2.6 cm in diameter. No dissection is noted. No mediastinal mass or adenopathy is noted. Visualized portion of upper abdomen is unremarkable. No significant osseous abnormality is noted. Review of the MIP images confirms the above findings. IMPRESSION: Coronary artery calcifications suggesting coronary artery disease. 4.8 cm ascending thoracic aortic aneurysm is noted. This is not significantly changed compared to prior exam. Recommend semi-annual imaging  followup by CTA or MRA and referral to cardiothoracic surgery if not already obtained. This recommendation follows 2010 ACCF/AHA/AATS/ACR/ASA/SCA/SCAI/SIR/STS/SVM Guidelines for  the Diagnosis and Management of Patients With Thoracic Aortic Disease. Circulation. 2010; 121: K938-H829. Electronically Signed   By: Marijo Conception, M.D.   On: 04/23/2015 14:31  Ct Angio Chest Aorta W/cm &/or Wo/cm  10/02/2014   CLINICAL DATA:  History of thoracic aortic aneurysm 3 years, followup  EXAM: CT ANGIOGRAPHY CHEST WITH CONTRAST  TECHNIQUE: Multidetector CT imaging of the chest was performed using the standard protocol during bolus administration of intravenous contrast. Multiplanar CT image reconstructions and MIPs were obtained to evaluate the vascular anatomy.  CONTRAST:  75 cc Isovue 370  COMPARISON:  CT angio chest of 10/04/2013  FINDINGS: There is again fusiform dilatation of the ascending aorta noted. Measured in the same planes as measured previously, the mid thoracic ascending aorta measures 4.8 x 5.0 cm compared to 4.8 x 4.9 cm previously. Cardiothoracic surgery consultation recommended due to increased risk of rupture for arch aneurysm ? 5.5 cm. This recommendation follows 2010 ACCF/AHA/AATS/ACR/ASA/SCA/SCAI/SIR/STS/SVM Guidelines for the Diagnosis and Management of Patients With Thoracic Aortic Disease. Circulation. 2010; 121: H371-I967. The mid thoracic aortic arch measures 2.7 cm. The mid descending thoracic aorta measured at the same level as the ascending aorta measures 2.6 cm. The distal thoracic aorta just above the diaphragmatic screw measures 2.7 cm. These measurements appear unchanged.  No mediastinal or hilar adenopathy is seen. The pulmonary arteries are not well opacified cannot and cannot be assessed. Extensive coronary artery calcifications are present primarily involving the left anterior descending artery and circumflex artery. Mild cardiomegaly is stable. 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 Correlation with surgical history is recommended. There are degenerative changes throughout the mid to lower thoracic spine.  On lung window images, no lung parenchymal abnormality is seen. No suspicious lung nodule is seen and there is no evidence of pleural effusion. The central airway is patent.  Review of the MIP images confirms the above findings.  IMPRESSION: 1. The mid ascending thoracic aorta measures 4.8 x 5.0 cm compared to 4.8 x 4.9 cm previously. Cardiothoracic surgery consultation recommended due to increased risk of rupture for arch aneurysm ? 5.5 cm. This recommendation follows 2010 ACCF/AHA/AATS/ACR/ASA/SCA/SCAI/SIR/STS/SVM Guidelines for the Diagnosis and Management of Patients With Thoracic Aortic Disease. Circulation. 2010; 121: E938-B017 . 2. Significant coronary artery calcifications primarily involving the left anterior descending and circumflex coronary arteries.   Electronically Signed   By: Ivar Drape M.D.   On: 10/02/2014 09:17   2020 ECHOCARDIOGRAM REPORT       Patient Name:   Travis Myers Date of Exam: 07/03/2018 Medical Rec #:  [id number]              Height:       76.5 in Accession #:    [id number]             Weight:       305.0 lb Date of Birth:  21-Sep-1955              BSA:          2.66 m Patient Age:    63 years               BP:           121/73 mmHg Patient Gender: M                      HR:  65 bpm. Exam Location:  [facility]    Procedure: 2D Echo, 3D Echo, Cardiac Doppler and Color Doppler  Indications:    I35.9 Aortic Valve Disorder   History:        Patient has prior history of Echocardiogram examinations, most                 recent 04/14/2016. Signs/Symptoms: Murmur Risk Factors:                 Hypertension, Dyslipidemia and Sleep Apnea. Edema, Ascending                 Aortic Aneurysm (prior 4.9 cm).   Sonographer:    Deliah Boston RDCS Referring Phys: [phone]  Franciscojavier Wronski B Roanne Haye  IMPRESSIONS    1. The left ventricle has normal systolic function with an ejection fraction of 60-65%. The cavity size was normal. Left ventricular diastolic Doppler parameters are consistent with impaired relaxation. Indeterminate filling pressures The E/e' is 8-15.  No evidence of left ventricular regional wall motion abnormalities.  2. The right ventricle has normal systolic function. The cavity was normal. There is no increase in right ventricular wall thickness.  3. Right atrial size was mildly dilated.  4. The mitral valve is grossly normal.  5. The tricuspid valve is grossly normal.  6. The aortic valve is abnormal. Aortic valve regurgitation is mild to moderate by color flow Doppler. No stenosis of the aortic valve.  7. There is moderate to severe dilatation of the ascending aorta measuring 49 mm.  8. When compared to the prior study: 04/14/2016: LVEF 55-60%, dilated ascending aorta to 4.9 cm.  SUMMARY   LVEF 60-65%, normal wall thickness, normal wall motion, grade 1 DD indeterminate LV filling pressure, dilated ascending aorta to 4.9cm, mild to moderate AI, trivial TR, normal RVSP, normal IVC  FINDINGS  Left Ventricle: The left ventricle has normal systolic function, with an ejection fraction of 60-65%. The cavity size was normal. There is no increase in left ventricular wall thickness. Left ventricular diastolic Doppler parameters are consistent with  impaired relaxation. Indeterminate filling pressures The E/e' is 8-15. No evidence of left ventricular regional wall motion abnormalities..  Right Ventricle: The right ventricle has normal systolic function. The cavity was normal. There is no increase in right ventricular wall thickness.  Left Atrium: Left atrial size was normal in size.  Right Atrium: Right atrial size was mildly dilated. Right atrial pressure is estimated at 3 mmHg.  Interatrial Septum: No atrial level shunt detected by color flow  Doppler.  Pericardium: There is no evidence of pericardial effusion.  Mitral Valve: The mitral valve is grossly normal. Mitral valve regurgitation is not visualized by color flow Doppler.  Tricuspid Valve: The tricuspid valve is grossly normal. Tricuspid valve regurgitation is trivial by color flow Doppler.  Aortic Valve: The aortic valve is abnormal Aortic valve regurgitation is mild to moderate by color flow Doppler. There is No stenosis of the aortic valve, with a calculated valve area of 2.67 cm.  Pulmonic Valve: The pulmonic valve was grossly normal. Pulmonic valve regurgitation is not visualized by color flow Doppler.  Aorta: There is moderate to severe dilatation of the ascending aorta measuring 49 mm.  Venous: The inferior vena cava measures 1.60 cm, is normal in size with greater than 50% respiratory variability.  Compared to previous exam: 04/14/2016: LVEF 55-60%, dilated ascending aorta to 4.9 cm.    +--------------+--------++  LEFT VENTRICLE            +----------------+----------++ +--------------+--------++  Diastology                     PLAX 2D                   +----------------+----------++ +--------------+--------++  LV e' lateral:   9.32 cm/s     LVIDd:         5.46 cm    +----------------+----------++ +--------------+--------++  LV E/e' lateral: 10.6          LVIDs:         3.10 cm    +----------------+----------++ +--------------+--------++  LV e' medial:    10.10 cm/s    LV PW:         1.07 cm    +----------------+----------++ +--------------+--------++  LV E/e' medial:  9.8           LV IVS:        0.83 cm    +----------------+----------++ +--------------+--------++  LVOT diam:     2.40 cm    +--------------+--------++  LV SV:         107 ml     +--------------+--------++  LV SV Index:   38.55      +--------------+--------++  LVOT Area:     4.52 cm   +--------------+--------++                             +--------------+--------++  +---------------+----------++  RIGHT VENTRICLE              +---------------+----------++  RV S prime:     15.10 cm/s   +---------------+----------++  TAPSE (M-mode): 3.7 cm       +---------------+----------++  RVSP:           20.0 mmHg    +---------------+----------++  +---------------+-------++-----------++  LEFT ATRIUM              Index         +---------------+-------++-----------++  LA diam:        4.30 cm  1.61 cm/m    +---------------+-------++-----------++  LA Vol (A2C):   66.1 ml  24.82 ml/m   +---------------+-------++-----------++  LA Vol (A4C):   83.9 ml  31.50 ml/m   +---------------+-------++-----------++  LA Biplane Vol: 75.0 ml  28.16 ml/m   +---------------+-------++-----------++ +------------+---------++-----------++  RIGHT ATRIUM            Index         +------------+---------++-----------++  RA Pressure: 3.00 mmHg                +------------+---------++-----------++  RA Area:     28.40 cm                +------------+---------++-----------++  RA Volume:   95.50 ml   35.86 ml/m   +------------+---------++-----------++  +------------------+------------++  AORTIC VALVE                      +------------------+------------++  AV Area (Vmax):    2.60 cm       +------------------+------------++  AV Area (Vmean):   2.66 cm       +------------------+------------++  AV Area (VTI):     2.67 cm       +------------------+------------++  AV Vmax:           195.00 cm/s    +------------------+------------++  AV Vmean:          132.667 cm/s   +------------------+------------++  AV VTI:  0.488 m        +------------------+------------++  AV Peak Grad:      15.2 mmHg      +------------------+------------++  AV Mean Grad:      8.3 mmHg       +------------------+------------++  LVOT Vmax:         112.00 cm/s    +------------------+------------++  LVOT Vmean:        77.900 cm/s     +------------------+------------++  LVOT VTI:          0.288 m        +------------------+------------++  LVOT/AV VTI ratio: 0.59           +------------------+------------++  AR PHT:            479 msec       +------------------+------------++   +-------------+-------++  AORTA                   +-------------+-------++  Ao Root diam: 3.10 cm   +-------------+-------++  Ao Asc diam:  4.85 cm   +-------------+-------++  +--------------+--------++    +---------------+-----------++  MITRAL VALVE                  TRICUSPID VALVE               +--------------+--------++    +---------------+-----------++  MV Area (PHT): cm            TR Peak grad:   17.0 mmHg     +--------------+--------++    +---------------+-----------++  MV PHT:        msec           TR Vmax:        206.00 cm/s   +--------------+--------++    +---------------+-----------++  MV Decel Time: 317 msec       Estimated RAP:  3.00 mmHg     +--------------+--------++    +---------------+-----------++ +--------------+-----------++  RVSP:           20.0 mmHg      MV E velocity: 98.50 cm/s    +---------------+-----------++ +--------------+-----------++  MV A velocity: 118.00 cm/s   +--------------+-------+ +--------------+-----------++  SHUNTS                   MV E/A ratio:  0.83          +--------------+-------+ +--------------+-----------++  Systemic VTI:  0.29 m                                 +--------------+-------+                                Systemic Diam: 2.40 cm                                +--------------+-------+  +---------+-------+  IVC                +---------+-------+  IVC diam: 1.60 cm  +---------+-------+    Lyman Bishop MD Electronically signed by Lyman Bishop MD Signature Date/Time: 07/03/2018/1:16:55 PM       ECHO 04/13/2016 Echocardiography  Patient:    Jayshaun, Phillips MR #:       [id number] Study Date: 04/14/2016 Gender:     M Age:        50 Height:  193  cm Weight:     143.8 kg BSA:        2.83 m^2 Pt. Status: Room:   ATTENDING    Lanelle Bal MD  ORDERING     Lanelle Bal MD  [place] MD  PERFORMING   [facility] Outpatient  SONOGRAPHER  Mikki Santee  cc:  ------------------------------------------------------------------- LV EF: 55% -   60%  ------------------------------------------------------------------- Indications:      Aneurysm - thoracic 441.2.  ------------------------------------------------------------------- History:   PMH:  Aneurysm of the Ascending Aorta.  ------------------------------------------------------------------- Study Conclusions  - Left ventricle: The cavity size was normal. Wall thickness was   normal. Systolic function was normal. The estimated ejection   fraction was in the range of 55% to 60%. Wall motion was normal;   there were no regional wall motion abnormalities. Doppler   parameters are consistent with abnormal left ventricular   relaxation (grade 1 diastolic dysfunction). - Aortic valve: There was mild to moderate regurgitation directed   centrally in the LVOT. - Left atrium: The atrium was moderately dilated. - Atrial septum: No defect or patent foramen ovale was identified.  ------------------------------------------------------------------- Study data:  Comparison was made to the study of 04/20/2015.  Study status:  Routine.  Procedure:  The patient reported no pain pre or post test. Transthoracic echocardiography. Image quality was adequate.  Study completion:  There were no complications. Echocardiography.  M-mode, complete 2D, spectral Doppler, and color Doppler.  Birthdate:  Patient birthdate: March 25, 1955.  Age:  Patient is 63 yr old.  Sex:  Gender: male.    BMI: 38.6 kg/m^2.  Blood pressure:     153/87  Patient status:  Outpatient.  Study date: Study date: 04/14/2016. Study time: 08:54 AM.  Location:   Echo laboratory.  -------------------------------------------------------------------  ------------------------------------------------------------------- Left ventricle:  The cavity size was normal. Wall thickness was normal. Systolic function was normal. The estimated ejection fraction was in the range of 55% to 60%. Wall motion was normal; there were no regional wall motion abnormalities. Doppler parameters are consistent with abnormal left ventricular relaxation (grade 1 diastolic dysfunction).  ------------------------------------------------------------------- Aortic valve:   Structurally normal valve. Trileaflet. Cusp separation was normal.  Doppler:  Transvalvular velocity was within the normal range. There was no stenosis. There was mild to moderate regurgitation directed centrally in the LVOT.  ------------------------------------------------------------------- Aorta:  The aorta was poorly visualized. Ascending aorta: The ascending aorta was moderately dilated.  ------------------------------------------------------------------- Mitral valve:   Structurally normal valve.   Leaflet separation was normal.  Doppler:  Transvalvular velocity was within the normal range. There was no evidence for stenosis. There was no regurgitation.    Peak gradient (D): 3 mm Hg.  ------------------------------------------------------------------- Left atrium:  The atrium was moderately dilated.  ------------------------------------------------------------------- Atrial septum:  No defect or patent foramen ovale was identified.   ------------------------------------------------------------------- Right ventricle:  Poorly visualized. The cavity size was normal. Systolic function was normal.  ------------------------------------------------------------------- Pulmonic valve:    Structurally normal valve.   Cusp separation was normal.  Doppler:  Transvalvular velocity was within the  normal range. There was no regurgitation.  ------------------------------------------------------------------- Tricuspid valve:   Structurally normal valve.   Leaflet separation was normal.  Doppler:  Transvalvular velocity was within the normal range. There was trivial regurgitation.  ------------------------------------------------------------------- Pulmonary artery:   Systolic pressure was within the normal range.   ------------------------------------------------------------------- Right atrium:  The atrium was normal in size.  ------------------------------------------------------------------- Systemic veins: Inferior vena cava: The vessel was normal in size. The  respirophasic diameter changes were in the normal range (= 50%), consistent with normal central venous pressure.  ------------------------------------------------------------------- Post procedure conclusions Ascending Aorta:  - The aorta was poorly visualized.  ------------------------------------------------------------------- Measurements   Left ventricle                          Value        Reference  LV ID, ED, PLAX chordal          (H)    53.5  mm     43 - 52  LV ID, ES, PLAX chordal                 32.1  mm     23 - 38  LV fx shortening, PLAX chordal          40    %      >=29  LV PW thickness, ED                     11.3  mm     ----------  IVS/LV PW ratio, ED                     1.13         <=1.3  Stroke volume, 2D                       134   ml     ----------  Stroke volume/bsa, 2D                   47    ml/m^2 ----------  LV e&', lateral                          6.64  cm/s   ----------  LV E/e&', lateral                        12.68        ----------  LV e&', medial                           7.4   cm/s   ----------  LV E/e&', medial                         11.38        ----------  LV e&', average                          7.02  cm/s   ----------  LV E/e&', average                         11.99        ----------  Longitudinal strain, TDI                18    %      ----------    Ventricular septum                      Value        Reference  IVS thickness, ED                       12.8  mm     ----------  LVOT                                    Value        Reference  LVOT ID, S                              23    mm     ----------  LVOT area                               4.15  cm^2   ----------  LVOT peak velocity, S                   124   cm/s   ----------  LVOT mean velocity, S                   82.3  cm/s   ----------  LVOT VTI, S                             32.3  cm     ----------  LVOT peak gradient, S                   6     mm Hg  ----------    Aortic valve                            Value        Reference  Aortic regurg pressure half-time        816   ms     ----------    Aorta                                   Value        Reference  Aortic root ID, ED                      26    mm     ----------  Ascending aorta ID, A-P, mid, ED (H)    47.4  mm     21 - 34    Left atrium                             Value        Reference  LA ID, A-P, ES                          45    mm     ----------  LA ID/bsa, A-P                          1.59  cm/m^2 <=2.2  LA volume, ES, 1-p A4C                  49.8  ml     ----------  LA volume/bsa, ES, 1-p A4C              17.6  ml/m^2 ----------  LA volume, ES, 1-p A2C  81.6  ml     ----------  LA volume/bsa, ES, 1-p A2C              28.9  ml/m^2 ----------    Mitral valve                            Value        Reference  Mitral E-wave peak velocity             84.2  cm/s   ----------  Mitral A-wave peak velocity             90.6  cm/s   ----------  Mitral deceleration time                215   ms     150 - 230  Mitral peak gradient, D                 3     mm Hg  ----------  Mitral E/A ratio, peak                  0.9          ----------    Pulmonary arteries                      Value        Reference  PA  pressure, S, DP                      25    mm Hg  <=30    Tricuspid valve                         Value        Reference  Tricuspid regurg peak velocity          235   cm/s   ----------  Tricuspid peak RV-RA gradient           22    mm Hg  ----------    Right atrium                            Value        Reference  RA ID, S-I, ES, A4C              (H)    67.8  mm     34 - 49  RA area, ES, A4C                 (H)    24.3  cm^2   8.3 - 19.5  RA volume, ES, A/L                      72.8  ml     ----------  RA volume/bsa, ES, A/L                  25.7  ml/m^2 ----------    Systemic veins                          Value        Reference  Estimated CVP                           3  mm Hg  ----------    Right ventricle                         Value        Reference  TAPSE                                   22    mm     ----------  RV pressure, S, DP                      25    mm Hg  <=30  RV s&', lateral, S                       12.9  cm/s   ----------  Legend: (L)  and  (H)  mark values outside specified reference range.  ------------------------------------------------------------------- Prepared and Electronically Authenticated by  Sanda Klein, MD 2018-04-05T14:52:39    Recent Lab Findings: Lab Results  Component Value Date   WBC 5.3 02/18/2016   HGB 13.5 02/18/2016   HCT 39.4 02/18/2016   PLT 199 02/18/2016   GLUCOSE 145 (H) 02/25/2016   ALT 26 09/28/2012   AST 17 09/28/2012   NA 139 02/25/2016   K 4.3 02/25/2016   CL 108 02/25/2016   CREATININE 0.90 02/25/2016   BUN 14 02/25/2016   CO2 25 02/25/2016   INR 1.01 03/05/2014   HGBA1C 5.6 02/18/2016   Aortic Size Index=     4.9 /Body surface area is 2.75 meters squared. = 1.79  < 2.75 cm/m2      4% risk per year 2.75 to 4.25          8% risk per year > 4.25 cm/m2    20% risk per year  Cross-sectional area to height ratio=9.4    Assessment / Plan: Coronary artery calcifications suggesting coronary artery  disease-without symptoms of angina  4.9 cm ascending thoracic aortic aneurysm is noted. This is not significantly changed compared to prior exam- mild to moderate Aortic regurgitation with a trileaflet aortic valve  We will plan to increase surveillance CT scan to every 6 months, last cardiology appointment was in 2018 , will have him reestablish cardiology care with Dr. Alvester Chou, also consider noninvasive testing of his possible coronary artery disease.  It is difficult to determine symptoms from him because he has become very limited in his physical ability due to his back and joint pain.   According to the 2010 ACC/AHA guidelines, we recommend patients with thoracic aortic disease to maintain a LDL of less than 70 and a HDL of greater than 50. We recommend their blood pressure to remain less than 135/85.      Grace Isaac MD      [address] [place] [address] Office [phone]   Beeper [phone]  07/05/2018 2:53 PM

## 2018-07-10 ENCOUNTER — Other Ambulatory Visit: Payer: Self-pay

## 2018-07-10 DIAGNOSIS — I712 Thoracic aortic aneurysm, without rupture, unspecified: Secondary | ICD-10-CM

## 2018-07-10 NOTE — Progress Notes (Signed)
Notes recorded by [name], MD on 07/03/2018 at 6:04 PM EDT  No change in size of Thoracic Ao Aneurysm. Repeat 2 months  I35.9 Aortic Valve Disorder

## 2018-07-20 ENCOUNTER — Telehealth: Payer: Self-pay

## 2018-07-20 NOTE — Telephone Encounter (Signed)
    COVID-19 Pre-Screening Questions:  . In the past 7 to 10 days have you had a cough,  shortness of breath, headache, congestion, fever (100 or greater) body aches, chills, sore throat, or sudden loss of taste or sense of smell? NO . Have you been around anyone with known Covid 19? NO . Have you been around anyone who is awaiting Covid 19 test results in the past 7 to 10 days?NO . Have you been around anyone who has been exposed to Covid 19, or has mentioned symptoms of Covid 19 within the past 7 to 10 days? NO  If you have any concerns/questions about symptoms patients report during screening (either on the phone or at threshold). Contact the provider seeing the patient or DOD for further guidance.  If neither are available contact a member of the leadership team.    Spoke with pt who is ok with converting 7/16 appt from virtual to [place]. Pt aware of mask policy in office and aware of restrictions regarding guests and verbalized understanding

## 2018-07-26 ENCOUNTER — Encounter: Payer: Self-pay | Admitting: Cardiovascular Disease

## 2018-07-26 ENCOUNTER — Ambulatory Visit (INDEPENDENT_AMBULATORY_CARE_PROVIDER_SITE_OTHER): Payer: BC Managed Care – PPO | Admitting: Cardiovascular Disease

## 2018-07-26 ENCOUNTER — Other Ambulatory Visit: Payer: Self-pay

## 2018-07-26 DIAGNOSIS — E782 Mixed hyperlipidemia: Secondary | ICD-10-CM

## 2018-07-26 DIAGNOSIS — I351 Nonrheumatic aortic (valve) insufficiency: Secondary | ICD-10-CM | POA: Diagnosis not present

## 2018-07-26 DIAGNOSIS — I712 Thoracic aortic aneurysm, without rupture, unspecified: Secondary | ICD-10-CM

## 2018-07-26 DIAGNOSIS — I10 Essential (primary) hypertension: Secondary | ICD-10-CM | POA: Diagnosis not present

## 2018-07-26 NOTE — Progress Notes (Signed)
07/26/2018 Travis Myers   1955-06-06  [id number]  Primary Physician Cyndi Bender, PA-C Primary Cardiologist: [name] MD Travis Myers, [place]  HPI:  Travis Myers is a 63 y.o.  moderately overweight married Caucasian male with no children who works in the Transport planner at [employer]. He was previously a patient of Dr. Georgiann Mccoy His primary care provider is Cyndi Bender he PA-C at [facility]. I last saw him in the office  11/01/2016. He has a history of treated hypertension and hyperlipidemia. There is no family history. He has never had a heart attack or stroke. He denies chest pain or shortness of breath. He does have a thoracic aortic aneurysm measuring 51 mm by CT angiogram performed in September last year. This is followed on an annual basis. He had a negative Myoview stress test performed in February of this year done for preoperative clearance before a right total hip replacement done via the anterior approach by Dr. Alvan Dame . He is recuperated from this nicely. He had a right meniscal surgery by Dr. Theda Sers as well.   He does have a moderate sized ascending thoracic aortic aneurysm which we have been following by CTA.  Recently it measured 4.8-4.9.  Dr. Servando Snare is following this as well every 6 months.  Is asymptomatic from this.  He denies chest pain or shortness of breath.  No outpatient medications have been marked as taking for the 07/26/18 encounter (Office Visit) with [name], MD.     Allergies  Allergen Reactions  . Penicillins Other (See Comments)    Childhood reaction.  Has patient had a PCN reaction causing immediate rash, facial/tongue/throat swelling, SOB or lightheadedness with hypotension:unsure Has patient had a PCN reaction causing severe rash involving mucus membranes or skin necrosis:unsure Has patient had a PCN reaction that required hospitalization:unsure Has patient had a PCN reaction occurring within  the last 10 years:No If all of the above answers are "NO", then may proceed with Cephalosporin use.   . Quinolones     Patient was warned about not using Cipro and similar antibiotics. Recent studies have raised concern that fluoroquinolone antibiotics could be associated with an increased risk of aortic aneurysm Fluoroquinolones have non-antimicrobial properties that might jeopardise the integrity of the extracellular matrix of the vascular wall In a  propensity score matched cohort study in [place], there was a 66% increased rate of aortic aneurysm or dissection associated with oral fluoroquinolone use, compared wit    Social History   Socioeconomic History  . Marital status: Married    Spouse name: Not on file  . Number of children: Not on file  . Years of education: Not on file  . Highest education level: Not on file  Occupational History  . Not on file  Social Needs  . Financial resource strain: Not on file  . Food insecurity    Worry: Not on file    Inability: Not on file  . Transportation needs    Medical: Not on file    Non-medical: Not on file  Tobacco Use  . Smoking status: Never Smoker  . Smokeless tobacco: Former Network engineer and Sexual Activity  . Alcohol use: Yes    Alcohol/week: 1.0 standard drinks    Types: 1 Standard drinks or equivalent per week    Comment: RARE  . Drug use: No  . Sexual activity: Yes  Lifestyle  . Physical activity    Days  per week: Not on file    Minutes per session: Not on file  . Stress: Not on file  Relationships  . Social Herbalist on phone: Not on file    Gets together: Not on file    Attends religious service: Not on file    Active member of club or organization: Not on file    Attends meetings of clubs or organizations: Not on file    Relationship status: Not on file  . Intimate partner violence    Fear of current or ex partner: Not on file    Emotionally abused: Not on file    Physically abused: Not on  file    Forced sexual activity: Not on file  Other Topics Concern  . Not on file  Social History Narrative  . Not on file     Review of Systems: General: negative for chills, fever, night sweats or weight changes.  Cardiovascular: negative for chest pain, dyspnea on exertion, edema, orthopnea, palpitations, paroxysmal nocturnal dyspnea or shortness of breath Dermatological: negative for rash Respiratory: negative for cough or wheezing Urologic: negative for hematuria Abdominal: negative for nausea, vomiting, diarrhea, bright red blood per rectum, melena, or hematemesis Neurologic: negative for visual changes, syncope, or dizziness All other systems reviewed and are otherwise negative except as noted above.    Blood pressure 136/89, pulse 65, temperature (!) 97.5 F (36.4 C), height 6\' 4"  (1.93 m), weight (!) 308 lb 12.8 oz (140.1 kg), SpO2 96 %.  General appearance: alert and no distress Neck: no adenopathy, no carotid bruit, no JVD, supple, symmetrical, trachea midline and thyroid not enlarged, symmetric, no tenderness/mass/nodules Lungs: clear to auscultation bilaterally Heart: regular rate and rhythm, S1, S2 normal, no murmur, click, rub or gallop Extremities: extremities normal, atraumatic, no cyanosis or edema Pulses: 2+ and symmetric Skin: Skin color, texture, turgor normal. No rashes or lesions Neurologic: Alert and oriented X 3, normal strength and tone. Normal symmetric reflexes. Normal coordination and gait  EKG sinus rhythm at 65 with no ST or T wave changes.  I personally reviewed this EKG.  ASSESSMENT AND PLAN:   Thoracic aortic aneurysm History of thoracic aortic aneurysm which is followed by myself and Dr. Servando Snare it is remained stable at 4.9 cm by recent CTA.  He is otherwise asymptomatic from this.  He will be followed on a semiannual basis.  Essential hypertension History of essential hypertension with blood pressure measured today 136/89.  He is on Norvasc  and Prinivil.  Continue current meds current dosing  Hyperlipidemia History of hyperlipidemia on atorvastatin with lipid profile performed 07/12/2018 revealing total cholesterol 167, LDL of 102 and HDL 48.  Aortic insufficiency Mild to moderate aortic insufficiency by 2D echo recently performed 07/03/2018 with normal LV size and function.  We will continue to monitor to this on an annual basis.      [name] MD FACP,FACC,FAHA, [facility] 07/26/2018 11:18 AM

## 2018-07-26 NOTE — Assessment & Plan Note (Signed)
History of essential hypertension with blood pressure measured today 136/89.  He is on Norvasc and Prinivil.  Continue current meds current dosing

## 2018-07-26 NOTE — Assessment & Plan Note (Signed)
History of thoracic aortic aneurysm which is followed by myself and Dr. Servando Snare it is remained stable at 4.9 cm by recent CTA.  He is otherwise asymptomatic from this.  He will be followed on a semiannual basis.

## 2018-07-26 NOTE — Assessment & Plan Note (Signed)
Mild to moderate aortic insufficiency by 2D echo recently performed 07/03/2018 with normal LV size and function.  We will continue to monitor to this on an annual basis.

## 2018-07-26 NOTE — Assessment & Plan Note (Signed)
History of hyperlipidemia on atorvastatin with lipid profile performed 07/12/2018 revealing total cholesterol 167, LDL of 102 and HDL 48.

## 2018-07-26 NOTE — Patient Instructions (Addendum)
Medication Instructions:  Your physician recommends that you continue on your current medications as directed. Please refer to the Current Medication list given to you today.  If you need a refill on your cardiac medications before your next appointment, please call your pharmacy.   Lab work: none If you have labs (blood work) drawn today and your tests are completely normal, you will receive your results only by: Marland Kitchen MyChart Message (if you have MyChart) OR . A paper copy in the mail If you have any lab test that is abnormal or we need to change your treatment, we will call you to review the results.  Testing/Procedures: Your physician has requested that you have an echocardiogram. Echocardiography is a painless test that uses sound waves to create images of your heart. It provides your doctor with information about the size and shape of your heart and how well your heart's chambers and valves are working. This procedure takes approximately one hour. There are no restrictions for this procedure. LOCATION: [place], [place] [address]  TO BE SCHEDULED FOR APPROXIMATELY 12 MONTHS FROM TODAY   Follow-Up: At [facility], you and your health needs are our priority.  As part of our continuing mission to provide you with exceptional heart care, we have created designated Provider Care Teams.  These Care Teams include your primary Cardiologist (physician) and Advanced Practice Providers (APPs -  Physician Assistants and Nurse Practitioners) who all work together to provide you with the care you need, when you need it. You will need a follow up appointment in 6 months WITH DR. Gwenlyn Found.  Please call our office 2 months in advance to schedule this appointment.

## 2018-09-10 ENCOUNTER — Other Ambulatory Visit (HOSPITAL_COMMUNITY): Payer: BC Managed Care – PPO

## 2018-11-27 ENCOUNTER — Other Ambulatory Visit: Payer: Self-pay | Admitting: *Deleted

## 2018-11-27 DIAGNOSIS — I712 Thoracic aortic aneurysm, without rupture, unspecified: Secondary | ICD-10-CM

## 2019-01-02 ENCOUNTER — Other Ambulatory Visit: Payer: BC Managed Care – PPO

## 2019-01-03 ENCOUNTER — Encounter: Payer: BC Managed Care – PPO | Admitting: Cardiothoracic Surgery

## 2019-01-08 ENCOUNTER — Other Ambulatory Visit: Payer: Self-pay | Admitting: *Deleted

## 2019-01-08 DIAGNOSIS — I712 Thoracic aortic aneurysm, without rupture, unspecified: Secondary | ICD-10-CM

## 2019-01-09 ENCOUNTER — Other Ambulatory Visit: Payer: Self-pay | Admitting: *Deleted

## 2019-01-09 DIAGNOSIS — I712 Thoracic aortic aneurysm, without rupture, unspecified: Secondary | ICD-10-CM

## 2019-01-17 ENCOUNTER — Other Ambulatory Visit: Payer: BC Managed Care – PPO

## 2019-01-17 ENCOUNTER — Encounter: Payer: BC Managed Care – PPO | Admitting: Cardiothoracic Surgery

## 2019-02-20 ENCOUNTER — Other Ambulatory Visit: Payer: Self-pay | Admitting: *Deleted

## 2019-02-20 NOTE — Progress Notes (Deleted)
[address]       [place] [address]             [phone]                    Doran J Grothaus Myers [facility] Medical Record [id number] Date of Birth: 12/26/1955  Referring: [name], MD Primary Care: Cyndi Bender, PA-C Primary Cardiologist: No primary care provider on file. Chief Complaint:    No chief complaint on file.   History of Present Illness:    Travis Myers 64 y.o. male who returns today with a follow-up CTA of the chest to evaluate his  ascending aorta .  He has a known dilated a sending aorta and trileaflet aortic valve .  He was originally seen by me in oct 2016 at the request of Dr. Gwenlyn Found for dilated ascending aorta first noted in 2011.   The dilated aorta was first noted after abnormal preop chest x-ray before hip replacement in 2011, subsequent CT scan of the chest to rule out lung mass showed multiple small pulmonary nodules which are been unchanged over the years. His ascending aorta was noted to be dilated to 4.8 cm. Subsequent follow-up CT scans of the chest have been done in 2013 2014 2015 and fall  2016. An echocardiogram was done in 2014 and repeated 04/2015 and  2018. The patient has been asymptomatic from a cardiovascular standpoint. He has no evidence of congestive heart failure. He says these had a history of a murmur since childhood. He has no known family history of connective tissue disorder. He had a grandfather on his mother's side died of" heart disease" at age " 44 , But without any other details other than he died several hours after fishing at [employer] and bringing in a large fish. His father died at age 38 with lung cancer and COPD, he did have history of open repair of abdominal aortic aneurysm at 32.     Patient comes in today with a follow-up CTA of the chest  Current Activity/ Functional Status:  Patient is independent with mobility/ambulation, transfers, ADL's, IADL's.   Zubrod Score: At the time  of surgery this patients most appropriate activity status/level should be described as: [x]     0    Normal activity, no symptoms []     1    Restricted in physical strenuous activity but ambulatory, able to do out light work []     2    Ambulatory and capable of self care, unable to do work activities, up and about               >50 % of waking hours                              []     3    Only limited self care, in bed greater than 50% of waking hours []     4    Completely disabled, no self care, confined to bed or chair []     5    Moribund     Past Surgical History:  Procedure Laterality Date   BILATERAL [place] ECHOCARDIOGRAPHY  10/18/2011   EF >55 %   LUMBAR LAMINECTOMY/DECOMPRESSION MICRODISCECTOMY N/A 02/24/2016   Procedure: Bilateral Decompression L4-5, Discectomy L4-5 ;  Surgeon: Susa Day, MD;  Location: [facility];  Service: Orthopedics;  Laterality: N/A;   NM MYOCAR PERF WALL MOTION  10/13/2011   EF 49 %,low risk study   TONSILLECTOMY  1965   torn meniscus left knee surgery      TOTAL HIP ARTHROPLASTY  06-18-2009   OA LEFT HIP   TOTAL HIP ARTHROPLASTY Right 03/11/2014   Procedure: RIGHT TOTAL HIP ARTHROPLASTY ANTERIOR APPROACH;  Surgeon: [name], MD;  Location: [facility];  Service: Orthopedics;  Laterality: Right;   TRANSTHORACIC ECHOCARDIOGRAM  09/11/2012   EF 55 to 60 %,mild aortic valve regurg,mild mitral valve regurg,lf and rt atriums mildly dilated   VARICOSE VEIN SURGERY Left 1980   veins removed from left leg  1994    Family History  Problem Relation Age of Onset   Diabetes Mother    Cancer Mother    Heart failure Other    Diabetes Other    Cancer Other    COPD Other    Colon cancer Neg Hx    Stomach cancer Neg Hx     Social History   Socioeconomic History   Marital status: Married    Spouse name: Not on file   Number of children: Not on file   Years of education: Not on file   Highest education  level: Not on file  Occupational History   Not on file  Social Needs   Financial resource strain: Not on file   Food insecurity:    Worry: Not on file    Inability: Not on file   Transportation needs:    Medical: Not on file    Non-medical: Not on file  Tobacco Use   Smoking status: Never Smoker   Smokeless tobacco: Former Systems developer  Substance and Sexual Activity   Alcohol use: Yes    Alcohol/week: 1.0 standard drinks    Types: 1 Standard drinks or equivalent per week    Comment: RARE   Drug use: No    Social History   Tobacco Use  Smoking Status Never Smoker  Smokeless Tobacco Former Systems developer    Social History   Substance and Sexual Activity  Alcohol Use Yes   Alcohol/week: 1.0 standard drinks   Types: 1 Standard drinks or equivalent per week   Comment: RARE     Allergies  Allergen Reactions   Penicillins Other (See Comments)    Childhood reaction.  Has patient had a PCN reaction causing immediate rash, facial/tongue/throat swelling, SOB or lightheadedness with hypotension:unsure Has patient had a PCN reaction causing severe rash involving mucus membranes or skin necrosis:unsure Has patient had a PCN reaction that required hospitalization:unsure Has patient had a PCN reaction occurring within the last 10 years:No If all of the above answers are "NO", then may proceed with Cephalosporin use.    Quinolones     Patient was warned about not using Cipro and similar antibiotics. Recent studies have raised concern that fluoroquinolone antibiotics could be associated with an increased risk of aortic aneurysm Fluoroquinolones have non-antimicrobial properties that might jeopardise the integrity of the extracellular matrix of the vascular wall In a  propensity score matched cohort study in [place], there was a 66% increased rate of aortic aneurysm or dissection associated with oral fluoroquinolone use, compared wit    Current Outpatient Medications  Medication Sig  Dispense Refill   amLODipine (NORVASC) 10 MG tablet Take 1 tablet (10 mg total) daily by mouth. 90 tablet 3   aspirin EC 81 MG tablet Take 1 tablet (81 mg total) by mouth daily. Resume  4 days post-op     ATORVASTATIN CALCIUM PO Take 1 tablet by mouth daily.     Dermatological Products, Misc. (MOISTURE) CREA Apply 1-2 application topically 2 (two) times daily as needed (for dry skin). [employer]     furosemide (LASIX) 20 MG tablet Take 40 mg by mouth daily.  5   lisinopril (PRINIVIL,ZESTRIL) 40 MG tablet TAKE 1 TABLET BY MOUTH EVERY DAY FOR BLOOD PRESSURE  5   meclizine (ANTIVERT) 25 MG tablet Take 25 mg by mouth daily.  0   traMADol (ULTRAM) 50 MG tablet Take 1 tablet by mouth as directed.  2   No current facility-administered medications for this visit.    Review of Systems:  ROS    Physical Exam: There were no vitals taken for this visit.  PHYSICAL EXAMINATION: {physical exam:21449}  Diagnostic Studies & Laboratory data:     Recent Radiology Findings:  No results found. I have independently reviewed the above radiology studies  and reviewed the findings with the patient.   Ct Angio Chest Aorta W/cm &/or Wo/cm  Result Date: 04/14/2016 CLINICAL DATA:  Thoracic aortic aneurysm without rupture. EXAM: CT ANGIOGRAPHY CHEST WITH CONTRAST TECHNIQUE: Multidetector CT imaging of the chest was performed using the standard protocol during bolus administration of intravenous contrast. Multiplanar CT image reconstructions and MIPs were obtained to evaluate the vascular anatomy. CONTRAST:  75 mL of Isovue 370 intravenously. COMPARISON:  CT scan of April 23, 2015. FINDINGS: Cardiovascular: Stable 4.8 cm ascending thoracic aortic aneurysm is noted. Great vessels are widely patent without significant stenosis. Transverse aortic arch measures 2.9 cm. Proximal descending thoracic aorta measures 2.8 cm. Coronary artery calcifications are noted. Normal cardiac size. No pericardial  effusion is noted. Mediastinum/Nodes: No enlarged mediastinal, hilar, or axillary lymph nodes. Thyroid gland, trachea, and esophagus demonstrate no significant findings. Lungs/Pleura: Lungs are clear. No pleural effusion or pneumothorax. Upper Abdomen: No acute abnormality. Musculoskeletal: No chest wall abnormality. No acute or significant osseous findings. Review of the MIP images confirms the above findings. IMPRESSION: Stable 4.8 cm ascending thoracic aortic aneurysm. Ascending thoracic aortic aneurysm. Recommend semi-annual imaging followup by CTA or MRA and referral to cardiothoracic surgery if not already obtained. This recommendation follows 2010 ACCF/AHA/AATS/ACR/ASA/SCA/SCAI/SIR/STS/SVM Guidelines for the Diagnosis and Management of Patients With Thoracic Aortic Disease. Circulation. 2010; 121SP:1689793. Coronary artery calcifications suggesting coronary artery disease. Electronically Signed   By: Marijo Conception, M.D.   On: 04/14/2016 14:04    Ct Angio Chest Aorta W/cm &/or Wo/cm  04/23/2015  CLINICAL DATA:  Thoracic aortic aneurysm without rupture. EXAM: CT ANGIOGRAPHY CHEST WITH CONTRAST TECHNIQUE: Multidetector CT imaging of the chest was performed using the standard protocol during bolus administration of intravenous contrast. Multiplanar CT image reconstructions and MIPs were obtained to evaluate the vascular anatomy. CONTRAST:  75 mL of Isovue 370 intravenously. COMPARISON:  CT scan of October 02, 2014. FINDINGS: No pneumothorax or pleural effusion is noted. No acute pulmonary disease is noted. Coronary artery calcifications are noted. Aortic root measures 3.3 cm in diameter. Ascending thoracic aorta has a maximum measured diameter 4.8 cm which is not significantly changed compared to prior exam based on my own measurement. Transverse aortic arch measures 2.9 cm in diameter. Descending thoracic aorta measures 2.6 cm in diameter. No dissection is noted. No mediastinal mass or adenopathy is  noted. Visualized portion of upper abdomen is unremarkable. No significant osseous abnormality is noted. Review of the MIP images confirms the above findings. IMPRESSION: Coronary artery calcifications  suggesting coronary artery disease. 4.8 cm ascending thoracic aortic aneurysm is noted. This is not significantly changed compared to prior exam. Recommend semi-annual imaging followup by CTA or MRA and referral to cardiothoracic surgery if not already obtained. This recommendation follows 2010 ACCF/AHA/AATS/ACR/ASA/SCA/SCAI/SIR/STS/SVM Guidelines for the Diagnosis and Management of Patients With Thoracic Aortic Disease. Circulation. 2010; 121ZK:[id number]. Electronically Signed   By: Marijo Conception, M.D.   On: 04/23/2015 14:31  Ct Angio Chest Aorta W/cm &/or Wo/cm  10/02/2014   CLINICAL DATA:  History of thoracic aortic aneurysm 3 years, followup  EXAM: CT ANGIOGRAPHY CHEST WITH CONTRAST  TECHNIQUE: Multidetector CT imaging of the chest was performed using the standard protocol during bolus administration of intravenous contrast. Multiplanar CT image reconstructions and MIPs were obtained to evaluate the vascular anatomy.  CONTRAST:  75 cc Isovue 370  COMPARISON:  CT angio chest of 10/04/2013  FINDINGS: There is again fusiform dilatation of the ascending aorta noted. Measured in the same planes as measured previously, the mid thoracic ascending aorta measures 4.8 x 5.0 cm compared to 4.8 x 4.9 cm previously. Cardiothoracic surgery consultation recommended due to increased risk of rupture for arch aneurysm ? 5.5 cm. This recommendation follows 2010 ACCF/AHA/AATS/ACR/ASA/SCA/SCAI/SIR/STS/SVM Guidelines for the Diagnosis and Management of Patients With Thoracic Aortic Disease. Circulation. 2010; 121ZK:[id number]. The mid thoracic aortic arch measures 2.7 cm. The mid descending thoracic aorta measured at the same level as the ascending aorta measures 2.6 cm. The distal thoracic aorta just above the diaphragmatic screw  measures 2.7 cm. These measurements appear unchanged.  No mediastinal or hilar adenopathy is seen. The pulmonary arteries are not well opacified cannot and cannot be assessed. Extensive coronary artery calcifications are present primarily involving the left anterior descending artery and circumflex artery. Mild cardiomegaly is stable. 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 Correlation with surgical history is recommended. There are degenerative changes throughout the mid to lower thoracic spine.  On lung window images, no lung parenchymal abnormality is seen. No suspicious lung nodule is seen and there is no evidence of pleural effusion. The central airway is patent.  Review of the MIP images confirms the above findings.  IMPRESSION: 1. The mid ascending thoracic aorta measures 4.8 x 5.0 cm compared to 4.8 x 4.9 cm previously. Cardiothoracic surgery consultation recommended due to increased risk of rupture for arch aneurysm ? 5.5 cm. This recommendation follows 2010 ACCF/AHA/AATS/ACR/ASA/SCA/SCAI/SIR/STS/SVM Guidelines for the Diagnosis and Management of Patients With Thoracic Aortic Disease. Circulation. 2010; 121: [id number] . 2. Significant coronary artery calcifications primarily involving the left anterior descending and circumflex coronary arteries.   Electronically Signed   By: Ivar Drape M.D.   On: 10/02/2014 09:17   2020 ECHOCARDIOGRAM REPORT       Patient Name:   Travis Myers Date of Exam: 07/03/2018 Medical Rec #:  [id number]              Height:       76.5 in Accession #:    [id number]             Weight:       305.0 lb Date of Birth:  1956/01/06              BSA:          2.66 m Patient Age:    45 years  BP:           121/73 mmHg Patient Gender: M                      HR:           65 bpm. Exam Location:  [facility]    Procedure: 2D Echo, 3D Echo, Cardiac Doppler and Color  Doppler  Indications:    I35.9 Aortic Valve Disorder   History:        Patient has prior history of Echocardiogram examinations, most                 recent 04/14/2016. Signs/Symptoms: Murmur Risk Factors:                 Hypertension, Dyslipidemia and Sleep Apnea. Edema, Ascending                 Aortic Aneurysm (prior 4.9 cm).   Sonographer:    Deliah Boston RDCS Referring Phys: [id number] Travis Myers  IMPRESSIONS    1. The left ventricle has normal systolic function with an ejection fraction of 60-65%. The cavity size was normal. Left ventricular diastolic Doppler parameters are consistent with impaired relaxation. Indeterminate filling pressures The E/e' is 8-15.  No evidence of left ventricular regional wall motion abnormalities.  2. The right ventricle has normal systolic function. The cavity was normal. There is no increase in right ventricular wall thickness.  3. Right atrial size was mildly dilated.  4. The mitral valve is grossly normal.  5. The tricuspid valve is grossly normal.  6. The aortic valve is abnormal. Aortic valve regurgitation is mild to moderate by color flow Doppler. No stenosis of the aortic valve.  7. There is moderate to severe dilatation of the ascending aorta measuring 49 mm.  8. When compared to the prior study: 04/14/2016: LVEF 55-60%, dilated ascending aorta to 4.9 cm.  SUMMARY   LVEF 60-65%, normal wall thickness, normal wall motion, grade 1 DD indeterminate LV filling pressure, dilated ascending aorta to 4.9cm, mild to moderate AI, trivial TR, normal RVSP, normal IVC  FINDINGS  Left Ventricle: The left ventricle has normal systolic function, with an ejection fraction of 60-65%. The cavity size was normal. There is no increase in left ventricular wall thickness. Left ventricular diastolic Doppler parameters are consistent with  impaired relaxation. Indeterminate filling pressures The E/e' is 8-15. No evidence of left ventricular regional wall  motion abnormalities..  Right Ventricle: The right ventricle has normal systolic function. The cavity was normal. There is no increase in right ventricular wall thickness.  Left Atrium: Left atrial size was normal in size.  Right Atrium: Right atrial size was mildly dilated. Right atrial pressure is estimated at 3 mmHg.  Interatrial Septum: No atrial level shunt detected by color flow Doppler.  Pericardium: There is no evidence of pericardial effusion.  Mitral Valve: The mitral valve is grossly normal. Mitral valve regurgitation is not visualized by color flow Doppler.  Tricuspid Valve: The tricuspid valve is grossly normal. Tricuspid valve regurgitation is trivial by color flow Doppler.  Aortic Valve: The aortic valve is abnormal Aortic valve regurgitation is mild to moderate by color flow Doppler. There is No stenosis of the aortic valve, with a calculated valve area of 2.67 cm.  Pulmonic Valve: The pulmonic valve was grossly normal. Pulmonic valve regurgitation is not visualized by color flow Doppler.  Aorta: There is moderate to severe dilatation of the ascending aorta measuring 49  mm.  Venous: The inferior vena cava measures 1.60 cm, is normal in size with greater than 50% respiratory variability.  Compared to previous exam: 04/14/2016: LVEF 55-60%, dilated ascending aorta to 4.9 cm.    +--------------+--------++  LEFT VENTRICLE            +----------------+----------++ +--------------+--------++  Diastology                     PLAX 2D                   +----------------+----------++ +--------------+--------++  LV e' lateral:   9.32 cm/s     LVIDd:         5.46 cm    +----------------+----------++ +--------------+--------++  LV E/e' lateral: 10.6          LVIDs:         3.10 cm    +----------------+----------++ +--------------+--------++  LV e' medial:    10.10 cm/s    LV PW:         1.07 cm    +----------------+----------++ +--------------+--------++  LV E/e'  medial:  9.8           LV IVS:        0.83 cm    +----------------+----------++ +--------------+--------++  LVOT diam:     2.40 cm    +--------------+--------++  LV SV:         107 ml     +--------------+--------++  LV SV Index:   38.55      +--------------+--------++  LVOT Area:     4.52 cm   +--------------+--------++                            +--------------+--------++  +---------------+----------++  RIGHT VENTRICLE              +---------------+----------++  RV S prime:     15.10 cm/s   +---------------+----------++  TAPSE (M-mode): 3.7 cm       +---------------+----------++  RVSP:           20.0 mmHg    +---------------+----------++  +---------------+-------++-----------++  LEFT ATRIUM              Index         +---------------+-------++-----------++  LA diam:        4.30 cm  1.61 cm/m    +---------------+-------++-----------++  LA Vol (A2C):   66.1 ml  24.82 ml/m   +---------------+-------++-----------++  LA Vol (A4C):   83.9 ml  31.50 ml/m   +---------------+-------++-----------++  LA Biplane Vol: 75.0 ml  28.16 ml/m   +---------------+-------++-----------++ +------------+---------++-----------++  RIGHT ATRIUM            Index         +------------+---------++-----------++  RA Pressure: 3.00 mmHg                +------------+---------++-----------++  RA Area:     28.40 cm                +------------+---------++-----------++  RA Volume:   95.50 ml   35.86 ml/m   +------------+---------++-----------++  +------------------+------------++  AORTIC VALVE                      +------------------+------------++  AV Area (Vmax):    2.60 cm       +------------------+------------++  AV Area (Vmean):   2.66 cm       +------------------+------------++  AV Area (VTI):  2.67 cm       +------------------+------------++  AV Vmax:           195.00 cm/s    +------------------+------------++  AV Vmean:          132.667  cm/s   +------------------+------------++  AV VTI:            0.488 m        +------------------+------------++  AV Peak Grad:      15.2 mmHg      +------------------+------------++  AV Mean Grad:      8.3 mmHg       +------------------+------------++  LVOT Vmax:         112.00 cm/s    +------------------+------------++  LVOT Vmean:        77.900 cm/s    +------------------+------------++  LVOT VTI:          0.288 m        +------------------+------------++  LVOT/AV VTI ratio: 0.59           +------------------+------------++  AR PHT:            479 msec       +------------------+------------++   +-------------+-------++  AORTA                   +-------------+-------++  Ao Root diam: 3.10 cm   +-------------+-------++  Ao Asc diam:  4.85 cm   +-------------+-------++  +--------------+--------++    +---------------+-----------++  MITRAL VALVE                  TRICUSPID VALVE               +--------------+--------++    +---------------+-----------++  MV Area (PHT): cm            TR Peak grad:   17.0 mmHg     +--------------+--------++    +---------------+-----------++  MV PHT:        msec           TR Vmax:        206.00 cm/s   +--------------+--------++    +---------------+-----------++  MV Decel Time: 317 msec       Estimated RAP:  3.00 mmHg     +--------------+--------++    +---------------+-----------++ +--------------+-----------++  RVSP:           20.0 mmHg      MV E velocity: 98.50 cm/s    +---------------+-----------++ +--------------+-----------++  MV A velocity: 118.00 cm/s   +--------------+-------+ +--------------+-----------++  SHUNTS                   MV E/A ratio:  0.83          +--------------+-------+ +--------------+-----------++  Systemic VTI:  0.29 m                                 +--------------+-------+                                Systemic Diam: 2.40 cm                                +--------------+-------+  +---------+-------+  IVC                 +---------+-------+  IVC diam: 1.60 cm  +---------+-------+    Chrissie Noa  Hilty MD Electronically signed by Lyman Bishop MD Signature Date/Time: 07/03/2018/1:16:55 PM       ECHO 04/13/2016 Echocardiography  Patient:    Travis Myers, Travis Myers MR #:       [id number] Study Date: 04/14/2016 Gender:     M Age:        27 Height:     193 cm Weight:     143.8 kg BSA:        2.83 m^2 Pt. Status: Room:   ATTENDING    Lanelle Bal MD  ORDERING     Lanelle Bal MD  Clio MD  PERFORMING   [facility], Outpatient  SONOGRAPHER  Mikki Santee  cc:  ------------------------------------------------------------------- LV EF: 55% -   60%  ------------------------------------------------------------------- Indications:      Aneurysm - thoracic 441.2.  ------------------------------------------------------------------- History:   PMH:  Aneurysm of the Ascending Aorta.  ------------------------------------------------------------------- Study Conclusions  - Left ventricle: The cavity size was normal. Wall thickness was   normal. Systolic function was normal. The estimated ejection   fraction was in the range of 55% to 60%. Wall motion was normal;   there were no regional wall motion abnormalities. Doppler   parameters are consistent with abnormal left ventricular   relaxation (grade 1 diastolic dysfunction). - Aortic valve: There was mild to moderate regurgitation directed   centrally in the LVOT. - Left atrium: The atrium was moderately dilated. - Atrial septum: No defect or patent foramen ovale was identified.  ------------------------------------------------------------------- Study data:  Comparison was made to the study of 04/20/2015.  Study status:  Routine.  Procedure:  The patient reported no pain pre or post test. Transthoracic echocardiography. Image quality was adequate.  Study completion:  There were no  complications. Echocardiography.  M-mode, complete 2D, spectral Doppler, and color Doppler.  Birthdate:  Patient birthdate: 28-Nov-1955.  Age:  Patient is 63 yr old.  Sex:  Gender: male.    BMI: 38.6 kg/m^2.  Blood pressure:     153/87  Patient status:  Outpatient.  Study date: Study date: 04/14/2016. Study time: 08:54 AM.  Location:  Echo laboratory.  -------------------------------------------------------------------  ------------------------------------------------------------------- Left ventricle:  The cavity size was normal. Wall thickness was normal. Systolic function was normal. The estimated ejection fraction was in the range of 55% to 60%. Wall motion was normal; there were no regional wall motion abnormalities. Doppler parameters are consistent with abnormal left ventricular relaxation (grade 1 diastolic dysfunction).  ------------------------------------------------------------------- Aortic valve:   Structurally normal valve. Trileaflet. Cusp separation was normal.  Doppler:  Transvalvular velocity was within the normal range. There was no stenosis. There was mild to moderate regurgitation directed centrally in the LVOT.  ------------------------------------------------------------------- Aorta:  The aorta was poorly visualized. Ascending aorta: The ascending aorta was moderately dilated.  ------------------------------------------------------------------- Mitral valve:   Structurally normal valve.   Leaflet separation was normal.  Doppler:  Transvalvular velocity was within the normal range. There was no evidence for stenosis. There was no regurgitation.    Peak gradient (D): 3 mm Hg.  ------------------------------------------------------------------- Left atrium:  The atrium was moderately dilated.  ------------------------------------------------------------------- Atrial septum:  No defect or patent foramen ovale was identified.    ------------------------------------------------------------------- Right ventricle:  Poorly visualized. The cavity size was normal. Systolic function was normal.  ------------------------------------------------------------------- Pulmonic valve:    Structurally normal valve.   Cusp separation was normal.  Doppler:  Transvalvular velocity was within the normal range. There was no regurgitation.  ------------------------------------------------------------------- Tricuspid valve:   Structurally normal  valve.   Leaflet separation was normal.  Doppler:  Transvalvular velocity was within the normal range. There was trivial regurgitation.  ------------------------------------------------------------------- Pulmonary artery:   Systolic pressure was within the normal range.   ------------------------------------------------------------------- Right atrium:  The atrium was normal in size.  ------------------------------------------------------------------- Systemic veins: Inferior vena cava: The vessel was normal in size. The respirophasic diameter changes were in the normal range (= 50%), consistent with normal central venous pressure.  ------------------------------------------------------------------- Post procedure conclusions Ascending Aorta:  - The aorta was poorly visualized.  ------------------------------------------------------------------- Measurements   Left ventricle                          Value        Reference  LV ID, ED, PLAX chordal          (H)    53.5  mm     43 - 52  LV ID, ES, PLAX chordal                 32.1  mm     23 - 38  LV fx shortening, PLAX chordal          40    %      >=29  LV PW thickness, ED                     11.3  mm     ----------  IVS/LV PW ratio, ED                     1.13         <=1.3  Stroke volume, 2D                       134   ml     ----------  Stroke volume/bsa, 2D                   47    ml/m^2 ----------  LV e&',  lateral                          6.64  cm/s   ----------  LV E/e&', lateral                        12.68        ----------  LV e&', medial                           7.4   cm/s   ----------  LV E/e&', medial                         11.38        ----------  LV e&', average                          7.02  cm/s   ----------  LV E/e&', average                        11.99        ----------  Longitudinal strain, TDI                18    %      ----------    Ventricular  septum                      Value        Reference  IVS thickness, ED                       12.8  mm     ----------    LVOT                                    Value        Reference  LVOT ID, S                              23    mm     ----------  LVOT area                               4.15  cm^2   ----------  LVOT peak velocity, S                   124   cm/s   ----------  LVOT mean velocity, S                   82.3  cm/s   ----------  LVOT VTI, S                             32.3  cm     ----------  LVOT peak gradient, S                   6     mm Hg  ----------    Aortic valve                            Value        Reference  Aortic regurg pressure half-time        816   ms     ----------    Aorta                                   Value        Reference  Aortic root ID, ED                      26    mm     ----------  Ascending aorta ID, A-P, mid, ED (H)    47.4  mm     21 - 34    Left atrium                             Value        Reference  LA ID, A-P, ES                          45    mm     ----------  LA ID/bsa, A-P  1.59  cm/m^2 <=2.2  LA volume, ES, 1-p A4C                  49.8  ml     ----------  LA volume/bsa, ES, 1-p A4C              17.6  ml/m^2 ----------  LA volume, ES, 1-p A2C                  81.6  ml     ----------  LA volume/bsa, ES, 1-p A2C              28.9  ml/m^2 ----------    Mitral valve                            Value        Reference  Mitral E-wave peak velocity              84.2  cm/s   ----------  Mitral A-wave peak velocity             90.6  cm/s   ----------  Mitral deceleration time                215   ms     150 - 230  Mitral peak gradient, D                 3     mm Hg  ----------  Mitral E/A ratio, peak                  0.9          ----------    Pulmonary arteries                      Value        Reference  PA pressure, S, DP                      25    mm Hg  <=30    Tricuspid valve                         Value        Reference  Tricuspid regurg peak velocity          235   cm/s   ----------  Tricuspid peak RV-RA gradient           22    mm Hg  ----------    Right atrium                            Value        Reference  RA ID, S-I, ES, A4C              (H)    67.8  mm     34 - 49  RA area, ES, A4C                 (H)    24.3  cm^2   8.3 - 19.5  RA volume, ES, A/L                      72.8  ml     ----------  RA volume/bsa, ES, A/L  25.7  ml/m^2 ----------    Systemic veins                          Value        Reference  Estimated CVP                           3     mm Hg  ----------    Right ventricle                         Value        Reference  TAPSE                                   22    mm     ----------  RV pressure, S, DP                      25    mm Hg  <=30  RV s&', lateral, S                       12.9  cm/s   ----------  Legend: (L)  and  (H)  mark values outside specified reference range.  ------------------------------------------------------------------- Prepared and Electronically Authenticated by  Sanda Klein, MD 2018-04-05T14:52:39    Recent Lab Findings: Lab Results  Component Value Date   WBC 5.3 02/18/2016   HGB 13.5 02/18/2016   HCT 39.4 02/18/2016   PLT 199 02/18/2016   GLUCOSE 145 (H) 02/25/2016   ALT 26 09/28/2012   AST 17 09/28/2012   NA 139 02/25/2016   K 4.3 02/25/2016   CL 108 02/25/2016   CREATININE 0.90 02/25/2016   BUN 14 02/25/2016   CO2 25 02/25/2016   INR  1.01 03/05/2014   HGBA1C 5.6 02/18/2016   Aortic Size Index=     4.9 /There is no height or weight on file to calculate BSA. = 1.79  < 2.75 cm/m2      4% risk per year 2.75 to 4.25          8% risk per year > 4.25 cm/m2    20% risk per year  Cross-sectional area to height ratio=9.4    Assessment / Plan: Coronary artery calcifications suggesting coronary artery disease-without symptoms of angina  4.9 cm ascending thoracic aortic aneurysm is noted. This is not significantly changed compared to prior exam- mild to moderate Aortic regurgitation with a trileaflet aortic valve  We will plan to increase surveillance CT scan to every 6 months, last cardiology appointment was in 2018 , will have him reestablish cardiology care with Dr. Alvester Chou, also consider noninvasive testing of his possible coronary artery disease.  It is difficult to determine symptoms from him because he has become very limited in his physical ability due to his back and joint pain.   According to the 2010 ACC/AHA guidelines, we recommend patients with thoracic aortic disease to maintain a LDL of less than 70 and a HDL of greater than 50. We recommend their blood pressure to remain less than 135/85.      Grace Isaac MD      [address] [place] [address] Office [phone]   Beeper [phone]  02/20/2019 10:36 AM

## 2019-02-21 ENCOUNTER — Inpatient Hospital Stay: Admission: RE | Admit: 2019-02-21 | Payer: BC Managed Care – PPO | Source: Ambulatory Visit

## 2019-02-21 ENCOUNTER — Encounter: Payer: BC Managed Care – PPO | Admitting: Cardiothoracic Surgery

## 2019-03-08 ENCOUNTER — Other Ambulatory Visit: Payer: Self-pay

## 2019-03-08 ENCOUNTER — Ambulatory Visit: Payer: BC Managed Care – PPO | Admitting: Cardiovascular Disease

## 2019-03-08 ENCOUNTER — Encounter: Payer: Self-pay | Admitting: Cardiovascular Disease

## 2019-03-08 VITALS — BP 142/85 | HR 75 | Ht 76.0 in | Wt 322.4 lb

## 2019-03-08 DIAGNOSIS — I712 Thoracic aortic aneurysm, without rupture, unspecified: Secondary | ICD-10-CM

## 2019-03-08 DIAGNOSIS — I10 Essential (primary) hypertension: Secondary | ICD-10-CM

## 2019-03-08 DIAGNOSIS — I351 Nonrheumatic aortic (valve) insufficiency: Secondary | ICD-10-CM

## 2019-03-08 DIAGNOSIS — E782 Mixed hyperlipidemia: Secondary | ICD-10-CM

## 2019-03-08 NOTE — Assessment & Plan Note (Signed)
History of mild to moderate aortic insufficiency by recent 2D echo performed 07/03/2018 with normal LV systolic function.  We will continue to follow this on annual basis.

## 2019-03-08 NOTE — Progress Notes (Signed)
03/08/2019 Travis Myers   11/22/1955  [id number]  Primary Physician Cyndi Bender, PA-C Primary Cardiologist: [name] MD Travis Myers, [place]  HPI:  Travis Myers is a 64 y.o.  moderately overweight married Caucasian male with no children who works in the Transport planner at [employer]. He was previously a patient of Dr. Georgiann Mccoy His primary care provider is Cyndi Bender he PA-C at [facility]. I last saw him in the office  07/26/2018. He has a history of treated hypertension and hyperlipidemia. There is no family history. He has never had a heart attack or stroke. He denies chest pain or shortness of breath. He does have a thoracic aortic aneurysm measuring 51 mm by CT angiogram performed in September last year. This is followed on an annual basis. He had a negative Myoview stress test performed in February of this year done for preoperative clearance before a right total hip replacement done via the anterior approach by Dr. Alvan Dame . He is recuperated from this nicely. He had a right meniscal surgery by Dr. Theda Sers as well.   He does have a moderate sized ascending thoracic aortic aneurysm which we have been following by CTA.  Recently it measured 49 mm. Dr. Servando Snare is following this as well every 6 months.  Is asymptomatic from this.  He denies chest pain or shortness of breath.  Recent 2D echo performed 07/03/2018 revealed normal LV systolic function with mild to moderate aortic insufficiency.   Current Meds  Medication Sig  . amLODipine (NORVASC) 10 MG tablet Take 1 tablet (10 mg total) daily by mouth.  Marland Kitchen aspirin EC 81 MG tablet Take 1 tablet (81 mg total) by mouth daily. Resume 4 days post-op  . ATORVASTATIN CALCIUM PO Take 1 tablet by mouth daily.  . Dermatological Products, Misc. (MOISTURE) CREA Apply 1-2 application topically 2 (two) times daily as needed (for dry skin). [employer] Emergency planning/management officer  . furosemide (LASIX) 20 MG tablet Take  40 mg by mouth daily.  Marland Kitchen lisinopril (PRINIVIL,ZESTRIL) 40 MG tablet TAKE 1 TABLET BY MOUTH EVERY DAY FOR BLOOD PRESSURE  . meclizine (ANTIVERT) 25 MG tablet Take 25 mg by mouth daily.  . traMADol (ULTRAM) 50 MG tablet Take 1 tablet by mouth as directed.     Allergies  Allergen Reactions  . Penicillins Other (See Comments)    Childhood reaction.  Has patient had a PCN reaction causing immediate rash, facial/tongue/throat swelling, SOB or lightheadedness with hypotension:unsure Has patient had a PCN reaction causing severe rash involving mucus membranes or skin necrosis:unsure Has patient had a PCN reaction that required hospitalization:unsure Has patient had a PCN reaction occurring within the last 10 years:No If all of the above answers are "NO", then may proceed with Cephalosporin use.   . Quinolones     Patient was warned about not using Cipro and similar antibiotics. Recent studies have raised concern that fluoroquinolone antibiotics could be associated with an increased risk of aortic aneurysm Fluoroquinolones have non-antimicrobial properties that might jeopardise the integrity of the extracellular matrix of the vascular wall In a  propensity score matched cohort study in [place], there was a 66% increased rate of aortic aneurysm or dissection associated with oral fluoroquinolone use, compared wit    Social History   Socioeconomic History  . Marital status: Married    Spouse name: Not on file  . Number of children: Not on file  . Years of education: Not on file  .  Highest education level: Not on file  Occupational History  . Not on file  Tobacco Use  . Smoking status: Never Smoker  . Smokeless tobacco: Former Network engineer and Sexual Activity  . Alcohol use: Yes    Alcohol/week: 1.0 standard drinks    Types: 1 Standard drinks or equivalent per week    Comment: RARE  . Drug use: No  . Sexual activity: Yes  Other Topics Concern  . Not on file  Social History Narrative   . Not on file   Social Determinants of Health   Financial Resource Strain:   . Difficulty of Paying Living Expenses: Not on file  Food Insecurity:   . Worried About Charity fundraiser in the Last Year: Not on file  . Ran Out of Food in the Last Year: Not on file  Transportation Needs:   . Lack of Transportation (Medical): Not on file  . Lack of Transportation (Non-Medical): Not on file  Physical Activity:   . Days of Exercise per Week: Not on file  . Minutes of Exercise per Session: Not on file  Stress:   . Feeling of Stress : Not on file  Social Connections:   . Frequency of Communication with Friends and Family: Not on file  . Frequency of Social Gatherings with Friends and Family: Not on file  . Attends Religious Services: Not on file  . Active Member of Clubs or Organizations: Not on file  . Attends Archivist Meetings: Not on file  . Marital Status: Not on file  Intimate Partner Violence:   . Fear of Current or Ex-Partner: Not on file  . Emotionally Abused: Not on file  . Physically Abused: Not on file  . Sexually Abused: Not on file     Review of Systems: General: negative for chills, fever, night sweats or weight changes.  Cardiovascular: negative for chest pain, dyspnea on exertion, edema, orthopnea, palpitations, paroxysmal nocturnal dyspnea or shortness of breath Dermatological: negative for rash Respiratory: negative for cough or wheezing Urologic: negative for hematuria Abdominal: negative for nausea, vomiting, diarrhea, bright red blood per rectum, melena, or hematemesis Neurologic: negative for visual changes, syncope, or dizziness All other systems reviewed and are otherwise negative except as noted above.    Blood pressure (!) 142/85, pulse 75, height 6\' 4"  (1.93 m), weight (!) 322 lb 6.4 oz (146.2 kg), SpO2 96 %.  General appearance: alert and no distress Neck: no adenopathy, no carotid bruit, no JVD, supple, symmetrical, trachea midline and  thyroid not enlarged, symmetric, no tenderness/mass/nodules Lungs: clear to auscultation bilaterally Heart: regular rate and rhythm, S1, S2 normal, no murmur, click, rub or gallop Extremities: extremities normal, atraumatic, no cyanosis or edema Pulses: 2+ and symmetric Skin: Skin color, texture, turgor normal. No rashes or lesions Neurologic: Alert and oriented X 3, normal strength and tone. Normal symmetric reflexes. Normal coordination and gait  EKG sinus rhythm at 75 without ST or T wave changes.  I personally reviewed this EKG.  ASSESSMENT AND PLAN:   Thoracic aortic aneurysm History of thoracic aortic aneurysm most recently studied by CT angiogram 07/05/2018 measuring 4.9 cm followed by Dr. Servando Snare as an outpatient for this.  Essential hypertension History of essential hypertension with blood pressure measured today 142/85.  He is on amlodipine and Prinivil.  Hyperlipidemia History of hyperlipidemia on statin therapy with lipid profile performed 07/12/2018 revealing total cholesterol 167, LDL 102 and HDL 48.  Aortic insufficiency History of mild to moderate aortic  insufficiency by recent 2D echo performed 07/03/2018 with normal LV systolic function.  We will continue to follow this on annual basis.      [name] MD FACP,FACC,FAHA, [facility] 03/08/2019 3:10 PM

## 2019-03-08 NOTE — Assessment & Plan Note (Signed)
History of essential hypertension with blood pressure measured today 142/85.  He is on amlodipine and Prinivil.

## 2019-03-08 NOTE — Assessment & Plan Note (Signed)
History of hyperlipidemia on statin therapy with lipid profile performed 07/12/2018 revealing total cholesterol 167, LDL 102 and HDL 48.

## 2019-03-08 NOTE — Patient Instructions (Signed)
Medication Instructions:  Your physician recommends that you continue on your current medications as directed. Please refer to the Current Medication list given to you today.  If you need a refill on your cardiac medications before your next appointment, please call your pharmacy.   Lab work: NONE  Testing/Procedures: Your physician has requested that you have an echocardiogram in June 2021. Echocardiography is a painless test that uses sound waves to create images of your heart. It provides your doctor with information about the size and shape of your heart and how well your heart's chambers and valves are working. This procedure takes approximately one hour. There are no restrictions for this procedure. Travis Myers 300  Follow-Up: At [facility], you and your health needs are our priority.  As part of our continuing mission to provide you with exceptional heart care, we have created designated Provider Care Teams.  These Care Teams include your primary Cardiologist (physician) and Advanced Practice Providers (APPs -  Physician Assistants and Nurse Practitioners) who all work together to provide you with the care you need, when you need it. You may see Dr. Gwenlyn Found or one of the following Advanced Practice Providers on your designated Care Team:    [name], PA-C  [place], [place]  [name], [place]  Your physician wants you to follow-up in: 1 year with Dr. Gwenlyn Found. You will receive a reminder letter in the mail two months in advance. If you don't receive a letter, please call our office to schedule the follow-up appointment.

## 2019-03-08 NOTE — Assessment & Plan Note (Signed)
History of thoracic aortic aneurysm most recently studied by CT angiogram 07/05/2018 measuring 4.9 cm followed by Dr. Servando Snare as an outpatient for this.

## 2019-03-14 ENCOUNTER — Ambulatory Visit
Admission: RE | Admit: 2019-03-14 | Discharge: 2019-03-14 | Disposition: A | Discharge status: Home / Self Care | Payer: BC Managed Care – PPO | Source: Ambulatory Visit | Attending: Cardiothoracic Surgery | Admitting: Cardiothoracic Surgery

## 2019-03-14 ENCOUNTER — Encounter: Payer: Self-pay | Admitting: *Deleted

## 2019-03-14 ENCOUNTER — Ambulatory Visit: Payer: BC Managed Care – PPO | Admitting: Cardiothoracic Surgery

## 2019-03-14 ENCOUNTER — Encounter: Payer: Self-pay | Admitting: Cardiothoracic Surgery

## 2019-03-14 ENCOUNTER — Other Ambulatory Visit: Payer: Self-pay

## 2019-03-14 VITALS — BP 129/83 | HR 80 | Temp 98.6°F | Resp 20 | Ht 76.0 in | Wt 316.0 lb

## 2019-03-14 DIAGNOSIS — I359 Nonrheumatic aortic valve disorder, unspecified: Secondary | ICD-10-CM | POA: Diagnosis not present

## 2019-03-14 DIAGNOSIS — I712 Thoracic aortic aneurysm, without rupture, unspecified: Secondary | ICD-10-CM

## 2019-03-14 MED ORDER — IOPAMIDOL (ISOVUE-370) INJECTION 76%
75.0000 mL | Freq: Once | INTRAVENOUS | Status: AC | PRN
  Administered 2019-03-14: 12:00:00 75 mL via INTRAVENOUS

## 2019-03-14 NOTE — Progress Notes (Signed)
[address]       [place] [address]             [phone]                    Arick J Hedding Myers [place] [facility] Medical Record [id number] Date of Birth: June 12, 1955  Referring: [name], MD Primary Care: Cyndi Bender, PA-C Primary Cardiologist: No primary care provider on file. Chief Complaint:    Chief Complaint  Patient presents with  . Thoracic Aortic Aneurysm    F/u with CTA today    History of Present Illness:    Travis Myers 64 y.o. male who returns today with a follow-up CTA of the chest to evaluate his  ascending aorta .  He has been followed by me since 2016 for a dilated ascending aorta and trileaflet aortic valve .  He was originally seen by me in oct 2016 at the request of Dr. Gwenlyn Found for dilated ascending aorta first noted in 2011.   The dilated aorta was first noted after abnormal preop chest x-ray before hip replacement in 2011, subsequent CT scan of the chest to rule out lung mass showed multiple small pulmonary nodules which are been unchanged over the years. His ascending aorta was noted to be dilated to 4.8 cm. Subsequent follow-up CT scans of the chest have been done in 2013 2014 2015 and fall  2016. An echocardiogram was done in 2014 and repeated 04/2015 and  2018. The patient has been asymptomatic from a cardiovascular standpoint. He has no evidence of congestive heart failure. He says these had a history of a murmur since childhood  .  I reviewed his family history of aortic disease with him - he has no known family history of connective tissue disorder. He had a grandfather on his mother's side died of" heart disease" at age " 34 , But without any other details other than he died several hours after fishing at [employer] and bringing in a large fish. His father died at age 73 with lung cancer and COPD, he did have history of open repair of abdominal aortic aneurysm at 27.     Patient comes in today with a follow-up CTA of  the chest  Current Activity/ Functional Status:  Patient is independent with mobility/ambulation, transfers, ADL's, IADL's.   Zubrod Score: At the time of surgery this patient's most appropriate activity status/level should be described as: [x]     0    Normal activity, no symptoms []     1    Restricted in physical strenuous activity but ambulatory, able to do out light work []     2    Ambulatory and capable of self care, unable to do work activities, up and about               >50 % of waking hours                              []     3    Only limited self care, in bed greater than 50% of waking hours []     4    Completely disabled, no self care, confined to bed or chair []     5    Moribund     Past Surgical History:  Procedure Laterality Date  . [place]  . DOPPLER ECHOCARDIOGRAPHY  10/18/2011   EF >55 %  . LUMBAR LAMINECTOMY/DECOMPRESSION MICRODISCECTOMY N/A 02/24/2016   Procedure: Bilateral Decompression L4-5, Discectomy L4-5 ;  Surgeon: Susa Day, MD;  Location: [facility];  Service: Orthopedics;  Laterality: N/A;  . NM MYOCAR PERF WALL MOTION  10/13/2011   EF 49 %,low risk study  . TONSILLECTOMY  1965  . torn meniscus left knee surgery     . TOTAL HIP ARTHROPLASTY  06-18-2009   OA LEFT HIP  . TOTAL HIP ARTHROPLASTY Right 03/11/2014   Procedure: RIGHT TOTAL HIP ARTHROPLASTY ANTERIOR APPROACH;  Surgeon: [name], MD;  Location: [facility];  Service: Orthopedics;  Laterality: Right;  . TRANSTHORACIC ECHOCARDIOGRAM  09/11/2012   EF 55 to 60 %,mild aortic valve regurg,mild mitral valve regurg,lf and rt atriums mildly dilated  . VARICOSE VEIN SURGERY Left 1980  . veins removed from left leg  1994    Family History  Problem Relation Age of Onset  . Diabetes Mother   . Cancer Mother   . Heart failure Other   . Diabetes Other   . Cancer Other   . COPD Other   . Colon cancer Neg Hx   . Stomach cancer Neg Hx     Social History   Socioeconomic  History  . Marital status: Married    Spouse name: Not on file  . Number of children: Not on file  . Years of education: Not on file  . Highest education level: Not on file  Occupational History  . Not on file  Social Needs  . Financial resource strain: Not on file  . Food insecurity:    Worry: Not on file    Inability: Not on file  . Transportation needs:    Medical: Not on file    Non-medical: Not on file  Tobacco Use  . Smoking status: Never Smoker  . Smokeless tobacco: Former Network engineer and Sexual Activity  . Alcohol use: Yes    Alcohol/week: 1.0 standard drinks    Types: 1 Standard drinks or equivalent per week    Comment: RARE  . Drug use: No    Social History   Tobacco Use  Smoking Status Never Smoker  Smokeless Tobacco Former Systems developer    Social History   Substance and Sexual Activity  Alcohol Use Yes  . Alcohol/week: 1.0 standard drinks  . Types: 1 Standard drinks or equivalent per week   Comment: RARE     Allergies  Allergen Reactions  . Penicillins Other (See Comments)    Childhood reaction.  Has patient had a PCN reaction causing immediate rash, facial/tongue/throat swelling, SOB or lightheadedness with hypotension:unsure Has patient had a PCN reaction causing severe rash involving mucus membranes or skin necrosis:unsure Has patient had a PCN reaction that required hospitalization:unsure Has patient had a PCN reaction occurring within the last 10 years:No If all of the above answers are "NO", then may proceed with Cephalosporin use.   . Quinolones     Patient was warned about not using Cipro and similar antibiotics. Recent studies have raised concern that fluoroquinolone antibiotics could be associated with an increased risk of aortic aneurysm Fluoroquinolones have non-antimicrobial properties that might jeopardise the integrity of the extracellular matrix of the vascular wall In a  propensity score matched cohort study in [place], there was a 66%  increased rate of aortic aneurysm or dissection associated with oral fluoroquinolone use, compared wit    Current Outpatient Medications  Medication Sig Dispense Refill  . aspirin EC  81 MG tablet Take 1 tablet (81 mg total) by mouth daily. Resume 4 days post-op    . ATORVASTATIN CALCIUM PO Take 1 tablet by mouth daily.    . Dermatological Products, Misc. (MOISTURE) CREA Apply 1-2 application topically 2 (two) times daily as needed (for dry skin). [employer] Emergency planning/management officer    . furosemide (LASIX) 20 MG tablet Take 40 mg by mouth daily.  5  . lisinopril (PRINIVIL,ZESTRIL) 40 MG tablet TAKE 1 TABLET BY MOUTH EVERY DAY FOR BLOOD PRESSURE  5  . meclizine (ANTIVERT) 25 MG tablet Take 25 mg by mouth daily.  0  . traMADol (ULTRAM) 50 MG tablet Take 1 tablet by mouth as directed.  2  . amLODipine (NORVASC) 10 MG tablet Take 1 tablet (10 mg total) daily by mouth. 90 tablet 3   No current facility-administered medications for this visit.    Review of Systems:  ROS    Physical Exam: BP 129/83 (BP Location: Right Arm, Patient Position: Sitting, Cuff Size: Large)   Pulse 80   Temp 98.6 F (37 C) (Oral)   Resp 20   Ht 6\' 4"  (1.93 m)   Wt (!) 316 lb (143.3 kg)   SpO2 96% Comment: RA  BMI 38.46 kg/m   PHYSICAL EXAMINATION: General appearance: alert, cooperative, appears stated age and no distress Head: Normocephalic, without obvious abnormality, atraumatic Neck: no adenopathy, no carotid bruit, no JVD, supple, symmetrical, trachea midline and thyroid not enlarged, symmetric, no tenderness/mass/nodules Lymph nodes: Cervical, supraclavicular, and axillary nodes normal. Resp: clear to auscultation bilaterally Cardio: regular rate and rhythm, S1, S2 normal, no murmur, click, rub or gallop GI: soft, non-tender; bowel sounds normal; no masses,  no organomegaly Extremities: extremities normal, atraumatic, no cyanosis , patient does have chronic venous stasis changes in both lower extremities with  pedal edema Neurologic: Grossly normal  Diagnostic Studies & Laboratory data:     Recent Radiology Findings:  CT ANGIO CHEST AORTA W &/OR WO CONTRAST  Result Date: 03/14/2019 CLINICAL DATA:  Thoracic aortic aneurysm. EXAM: CT ANGIOGRAPHY CHEST WITH CONTRAST TECHNIQUE: Multidetector CT imaging of the chest was performed using the standard protocol during bolus administration of intravenous contrast. Multiplanar CT image reconstructions and MIPs were obtained to evaluate the vascular anatomy. CONTRAST:  53mL ISOVUE-370 IOPAMIDOL (ISOVUE-370) INJECTION 76% COMPARISON:  July 05, 2018. FINDINGS: Cardiovascular: 4.8 cm ascending thoracic aortic aneurysm is noted which is not significantly changed compared to prior exam. Transverse aortic arch measures 3 cm. Proximal descending thoracic aorta measures 3 cm. No dissection is noted. Great vessels are widely patent without significant stenosis. Normal cardiac size. No pericardial effusion. Coronary artery calcifications are noted. Mediastinum/Nodes: No enlarged mediastinal, hilar, or axillary lymph nodes. Thyroid gland, trachea, and esophagus demonstrate no significant findings. Lungs/Pleura: Lungs are clear. No pleural effusion or pneumothorax. Upper Abdomen: No acute abnormality. Musculoskeletal: No chest wall abnormality. No acute or significant osseous findings. Review of the MIP images confirms the above findings. IMPRESSION: 1. Stable 4.8 cm ascending thoracic aortic aneurysm. Recommend semi-annual imaging followup by CTA or MRA and referral to cardiothoracic surgery if not already obtained. This recommendation follows 2010 ACCF/AHA/AATS/ACR/ASA/SCA/SCAI/SIR/STS/SVM Guidelines for the Diagnosis and Management of Patients With Thoracic Aortic Disease. Circulation. 2010; [id number]. Aortic aneurysm NOS (ICD10-I71.9). 2. Coronary artery calcifications are noted suggesting coronary artery disease. Electronically Signed   By: Marijo Conception M.D.   On: 03/14/2019  11:57   I have independently reviewed the above radiology studies  and reviewed the findings with  the patient.  CLINICAL DATA:  Thoracic aortic aneurysm without rupture.  EXAM: CT ANGIOGRAPHY CHEST WITH CONTRAST  TECHNIQUE: Multidetector CT imaging of the chest was performed using the standard protocol during bolus administration of intravenous contrast. Multiplanar CT image reconstructions and MIPs were obtained to evaluate the vascular anatomy.  CONTRAST:  85mL ISOVUE-370 IOPAMIDOL (ISOVUE-370) INJECTION 76%  COMPARISON:  CT scan of October 12, 2017.  FINDINGS: Cardiovascular: Grossly stable 4.9 cm ascending thoracic aortic aneurysm is noted without dissection. Aortic arch measures 2.9 cm. Proximal descending thoracic aorta measures 2.8 cm. Great vessels are widely patent without significant stenosis. Normal cardiac size. No pericardial effusion is noted. Coronary artery calcifications are noted.  Mediastinum/Nodes: No enlarged mediastinal, hilar, or axillary lymph nodes. Thyroid gland, trachea, and esophagus demonstrate no significant findings.  Lungs/Pleura: Lungs are clear. No pleural effusion or pneumothorax.  Upper Abdomen: No acute abnormality.  Musculoskeletal: No chest wall abnormality. No acute or significant osseous findings.  Review of the MIP images confirms the above findings.  IMPRESSION: Grossly stable 4.9 cm ascending thoracic aortic aneurysm. Recommend semi-annual imaging followup by CTA or MRA and referral to cardiothoracic surgery if not already obtained. This recommendation follows 2010 ACCF/AHA/AATS/ACR/ASA/SCA/SCAI/SIR/STS/SVM Guidelines for the Diagnosis and Management of Patients With Thoracic Aortic Disease. Circulation. 2010; [id number]. Aortic aneurysm NOS (ICD10-I71.9).  Coronary artery calcifications are noted.  Aortic aneurysm NOS (ICD10-I71.9).   Electronically Signed   By: Marijo Conception M.D.   On: 07/05/2018  13:52   Ct Angio Chest Aorta W/cm &/or Wo/cm  Result Date: 04/14/2016 CLINICAL DATA:  Thoracic aortic aneurysm without rupture. EXAM: CT ANGIOGRAPHY CHEST WITH CONTRAST TECHNIQUE: Multidetector CT imaging of the chest was performed using the standard protocol during bolus administration of intravenous contrast. Multiplanar CT image reconstructions and MIPs were obtained to evaluate the vascular anatomy. CONTRAST:  75 mL of Isovue 370 intravenously. COMPARISON:  CT scan of April 23, 2015. FINDINGS: Cardiovascular: Stable 4.8 cm ascending thoracic aortic aneurysm is noted. Great vessels are widely patent without significant stenosis. Transverse aortic arch measures 2.9 cm. Proximal descending thoracic aorta measures 2.8 cm. Coronary artery calcifications are noted. Normal cardiac size. No pericardial effusion is noted. Mediastinum/Nodes: No enlarged mediastinal, hilar, or axillary lymph nodes. Thyroid gland, trachea, and esophagus demonstrate no significant findings. Lungs/Pleura: Lungs are clear. No pleural effusion or pneumothorax. Upper Abdomen: No acute abnormality. Musculoskeletal: No chest wall abnormality. No acute or significant osseous findings. Review of the MIP images confirms the above findings. IMPRESSION: Stable 4.8 cm ascending thoracic aortic aneurysm. Ascending thoracic aortic aneurysm. Recommend semi-annual imaging followup by CTA or MRA and referral to cardiothoracic surgery if not already obtained. This recommendation follows 2010 ACCF/AHA/AATS/ACR/ASA/SCA/SCAI/SIR/STS/SVM Guidelines for the Diagnosis and Management of Patients With Thoracic Aortic Disease. Circulation. 2010; [id number]. Coronary artery calcifications suggesting coronary artery disease. Electronically Signed   By: Marijo Conception, M.D.   On: 04/14/2016 14:04    Ct Angio Chest Aorta W/cm &/or Wo/cm  04/23/2015  CLINICAL DATA:  Thoracic aortic aneurysm without rupture. EXAM: CT ANGIOGRAPHY CHEST WITH CONTRAST TECHNIQUE:  Multidetector CT imaging of the chest was performed using the standard protocol during bolus administration of intravenous contrast. Multiplanar CT image reconstructions and MIPs were obtained to evaluate the vascular anatomy. CONTRAST:  75 mL of Isovue 370 intravenously. COMPARISON:  CT scan of October 02, 2014. FINDINGS: No pneumothorax or pleural effusion is noted. No acute pulmonary disease is noted. Coronary artery calcifications are noted. Aortic root measures 3.3 cm in  diameter. Ascending thoracic aorta has a maximum measured diameter 4.8 cm which is not significantly changed compared to prior exam based on my own measurement. Transverse aortic arch measures 2.9 cm in diameter. Descending thoracic aorta measures 2.6 cm in diameter. No dissection is noted. No mediastinal mass or adenopathy is noted. Visualized portion of upper abdomen is unremarkable. No significant osseous abnormality is noted. Review of the MIP images confirms the above findings. IMPRESSION: Coronary artery calcifications suggesting coronary artery disease. 4.8 cm ascending thoracic aortic aneurysm is noted. This is not significantly changed compared to prior exam. Recommend semi-annual imaging followup by CTA or MRA and referral to cardiothoracic surgery if not already obtained. This recommendation follows 2010 ACCF/AHA/AATS/ACR/ASA/SCA/SCAI/SIR/STS/SVM Guidelines for the Diagnosis and Management of Patients With Thoracic Aortic Disease. Circulation. 2010; [id number]. Electronically Signed   By: Marijo Conception, M.D.   On: 04/23/2015 14:31  Ct Angio Chest Aorta W/cm &/or Wo/cm  10/02/2014   CLINICAL DATA:  History of thoracic aortic aneurysm 3 years, followup  EXAM: CT ANGIOGRAPHY CHEST WITH CONTRAST  TECHNIQUE: Multidetector CT imaging of the chest was performed using the standard protocol during bolus administration of intravenous contrast. Multiplanar CT image reconstructions and MIPs were obtained to evaluate the vascular  anatomy.  CONTRAST:  75 cc Isovue 370  COMPARISON:  CT angio chest of 10/04/2013  FINDINGS: There is again fusiform dilatation of the ascending aorta noted. Measured in the same planes as measured previously, the mid thoracic ascending aorta measures 4.8 x 5.0 cm compared to 4.8 x 4.9 cm previously. Cardiothoracic surgery consultation recommended due to increased risk of rupture for arch aneurysm ? 5.5 cm. This recommendation follows 2010 ACCF/AHA/AATS/ACR/ASA/SCA/SCAI/SIR/STS/SVM Guidelines for the Diagnosis and Management of Patients With Thoracic Aortic Disease. Circulation. 2010; [id number]. The mid thoracic aortic arch measures 2.7 cm. The mid descending thoracic aorta measured at the same level as the ascending aorta measures 2.6 cm. The distal thoracic aorta just above the diaphragmatic screw measures 2.7 cm. These measurements appear unchanged.  No mediastinal or hilar adenopathy is seen. The pulmonary arteries are not well opacified cannot and cannot be assessed. Extensive coronary artery calcifications are present primarily involving the left anterior descending artery and circumflex artery. Mild cardiomegaly is stable. 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 Correlation with surgical history is recommended. There are degenerative changes throughout the mid to lower thoracic spine.  On lung window images, no lung parenchymal abnormality is seen. No suspicious lung nodule is seen and there is no evidence of pleural effusion. The central airway is patent.  Review of the MIP images confirms the above findings.  IMPRESSION: 1. The mid ascending thoracic aorta measures 4.8 x 5.0 cm compared to 4.8 x 4.9 cm previously. Cardiothoracic surgery consultation recommended due to increased risk of rupture for arch aneurysm ? 5.5 cm. This recommendation follows 2010 ACCF/AHA/AATS/ACR/ASA/SCA/SCAI/SIR/STS/SVM Guidelines for the Diagnosis and Management  of Patients With Thoracic Aortic Disease. Circulation. 2010; 121: [id number] . 2. Significant coronary artery calcifications primarily involving the left anterior descending and circumflex coronary arteries.   Electronically Signed   By: Ivar Drape M.D.   On: 10/02/2014 09:17   2020 ECHOCARDIOGRAM REPORT       Patient Name:   Travis Myers Date of Exam: 07/03/2018 Medical Rec #:  [id number]              Height:  76.5 in Accession #:    [id number]             Weight:       305.0 lb Date of Birth:  04/07/1955              BSA:          2.66 m Patient Age:    53 years               BP:           121/73 mmHg Patient Gender: M                      HR:           65 bpm. Exam Location:  [facility]    Procedure: 2D Echo, 3D Echo, Cardiac Doppler and Color Doppler  Indications:    I35.9 Aortic Valve Disorder   History:        Patient has prior history of Echocardiogram examinations, most                 recent 04/14/2016. Signs/Symptoms: Murmur Risk Factors:                 Hypertension, Dyslipidemia and Sleep Apnea. Edema, Ascending                 Aortic Aneurysm (prior 4.9 cm).   Sonographer:    Deliah Boston RDCS Referring Phys: [phone] Damonta Cossey B Akiah Bauch  IMPRESSIONS    1. The left ventricle has normal systolic function with an ejection fraction of 60-65%. The cavity size was normal. Left ventricular diastolic Doppler parameters are consistent with impaired relaxation. Indeterminate filling pressures The E/e' is 8-15.  No evidence of left ventricular regional wall motion abnormalities.  2. The right ventricle has normal systolic function. The cavity was normal. There is no increase in right ventricular wall thickness.  3. Right atrial size was mildly dilated.  4. The mitral valve is grossly normal.  5. The tricuspid valve is grossly normal.  6. The aortic valve is abnormal. Aortic valve regurgitation is mild to moderate by color flow Doppler. No stenosis of the  aortic valve.  7. There is moderate to severe dilatation of the ascending aorta measuring 49 mm.  8. When compared to the prior study: 04/14/2016: LVEF 55-60%, dilated ascending aorta to 4.9 cm.  SUMMARY   LVEF 60-65%, normal wall thickness, normal wall motion, grade 1 DD indeterminate LV filling pressure, dilated ascending aorta to 4.9cm, mild to moderate AI, trivial TR, normal RVSP, normal IVC  FINDINGS  Left Ventricle: The left ventricle has normal systolic function, with an ejection fraction of 60-65%. The cavity size was normal. There is no increase in left ventricular wall thickness. Left ventricular diastolic Doppler parameters are consistent with  impaired relaxation. Indeterminate filling pressures The E/e' is 8-15. No evidence of left ventricular regional wall motion abnormalities..  Right Ventricle: The right ventricle has normal systolic function. The cavity was normal. There is no increase in right ventricular wall thickness.  Left Atrium: Left atrial size was normal in size.  Right Atrium: Right atrial size was mildly dilated. Right atrial pressure is estimated at 3 mmHg.  Interatrial Septum: No atrial level shunt detected by color flow Doppler.  Pericardium: There is no evidence of pericardial effusion.  Mitral Valve: The mitral valve is grossly normal. Mitral valve regurgitation is not visualized by color flow Doppler.  Tricuspid Valve: The tricuspid valve is  grossly normal. Tricuspid valve regurgitation is trivial by color flow Doppler.  Aortic Valve: The aortic valve is abnormal Aortic valve regurgitation is mild to moderate by color flow Doppler. There is No stenosis of the aortic valve, with a calculated valve area of 2.67 cm.  Pulmonic Valve: The pulmonic valve was grossly normal. Pulmonic valve regurgitation is not visualized by color flow Doppler.  Aorta: There is moderate to severe dilatation of the ascending aorta measuring 49 mm.  Venous: The  inferior vena cava measures 1.60 cm, is normal in size with greater than 50% respiratory variability.  Compared to previous exam: 04/14/2016: LVEF 55-60%, dilated ascending aorta to 4.9 cm.    +--------------+--------++ LEFT VENTRICLE         +----------------+----------++ +--------------+--------++ Diastology                 PLAX 2D                +----------------+----------++ +--------------+--------++ LV e' lateral:  9.32 cm/s  LVIDd:        5.46 cm  +----------------+----------++ +--------------+--------++ LV E/e' lateral:10.6       LVIDs:        3.10 cm  +----------------+----------++ +--------------+--------++ LV e' medial:   10.10 cm/s LV PW:        1.07 cm  +----------------+----------++ +--------------+--------++ LV E/e' medial: 9.8        LV IVS:       0.83 cm  +----------------+----------++ +--------------+--------++ LVOT diam:    2.40 cm  +--------------+--------++ LV SV:        107 ml   +--------------+--------++ LV SV Index:  38.55    +--------------+--------++ LVOT Area:    4.52 cm +--------------+--------++                        +--------------+--------++  +---------------+----------++ RIGHT VENTRICLE           +---------------+----------++ RV S prime:    15.10 cm/s +---------------+----------++ TAPSE (M-mode):3.7 cm     +---------------+----------++ RVSP:          20.0 mmHg  +---------------+----------++  +---------------+-------++-----------++ LEFT ATRIUM           Index       +---------------+-------++-----------++ LA diam:       4.30 cm1.61 cm/m  +---------------+-------++-----------++ LA Vol (A2C):  66.1 ml24.82 ml/m +---------------+-------++-----------++ LA Vol (A4C):  83.9 ml31.50 ml/m +---------------+-------++-----------++ LA Biplane Vol:75.0 ml28.16  ml/m +---------------+-------++-----------++ +------------+---------++-----------++ RIGHT ATRIUM         Index       +------------+---------++-----------++ RA Pressure:3.00 mmHg            +------------+---------++-----------++ RA Area:    28.40 cm            +------------+---------++-----------++ RA Volume:  95.50 ml 35.86 ml/m +------------+---------++-----------++  +------------------+------------++ AORTIC VALVE                   +------------------+------------++ AV Area (Vmax):   2.60 cm     +------------------+------------++ AV Area (Vmean):  2.66 cm     +------------------+------------++ AV Area (VTI):    2.67 cm     +------------------+------------++ AV Vmax:          195.00 cm/s  +------------------+------------++ AV Vmean:         132.667 cm/s +------------------+------------++ AV VTI:           0.488 m      +------------------+------------++ AV Peak Grad:     15.2 mmHg    +------------------+------------++  AV Mean Grad:     8.3 mmHg     +------------------+------------++ LVOT Vmax:        112.00 cm/s  +------------------+------------++ LVOT Vmean:       77.900 cm/s  +------------------+------------++ LVOT VTI:         0.288 m      +------------------+------------++ LVOT/AV VTI ratio:0.59         +------------------+------------++ AR PHT:           479 msec     +------------------+------------++   +-------------+-------++ AORTA                +-------------+-------++ Ao Root diam:3.10 cm +-------------+-------++ Ao Asc diam: 4.85 cm +-------------+-------++  +--------------+--------++    +---------------+-----------++ MITRAL VALVE              TRICUSPID VALVE            +--------------+--------++    +---------------+-----------++ MV Area (PHT):cm         TR Peak grad:  17.0 mmHg   +--------------+--------++     +---------------+-----------++ MV PHT:       msec        TR Vmax:       206.00 cm/s +--------------+--------++    +---------------+-----------++ MV Decel Time:317 msec    Estimated RAP: 3.00 mmHg   +--------------+--------++    +---------------+-----------++ +--------------+-----------++ RVSP:          20.0 mmHg   MV E velocity:98.50 cm/s  +---------------+-----------++ +--------------+-----------++ MV A velocity:118.00 cm/s +--------------+-------+ +--------------+-----------++ SHUNTS                MV E/A ratio: 0.83        +--------------+-------+ +--------------+-----------++ Systemic VTI: 0.29 m                                +--------------+-------+                               Systemic Diam:2.40 cm                               +--------------+-------+  +---------+-------+ IVC              +---------+-------+ IVC diam:1.60 cm +---------+-------+    Lyman Bishop MD Electronically signed by Lyman Bishop MD Signature Date/Time: 07/03/2018/1:16:55 PM       ECHO 04/13/2016 Echocardiography  Patient:    Crayton, Martina MR #:       [id number] Study Date: 04/14/2016 Gender:     M Age:        50 Height:     193 cm Weight:     143.8 kg BSA:        2.83 m^2 Pt. Status: Room:   ATTENDING    Lanelle Bal MD  ORDERING     Lanelle Bal MD  [facility] MD  PERFORMING   [facility]patient  SONOGRAPHER  Mikki Santee  cc:  ------------------------------------------------------------------- LV EF: 55% -   60%  ------------------------------------------------------------------- Indications:      Aneurysm - thoracic 441.2.  ------------------------------------------------------------------- History:   PMH:  Aneurysm of the Ascending Aorta.  ------------------------------------------------------------------- Study Conclusions  - Left ventricle: The cavity size was normal. Wall  thickness was   normal. Systolic function was normal. The estimated ejection   fraction was  in the range of 55% to 60%. Wall motion was normal;   there were no regional wall motion abnormalities. Doppler   parameters are consistent with abnormal left ventricular   relaxation (grade 1 diastolic dysfunction). - Aortic valve: There was mild to moderate regurgitation directed   centrally in the LVOT. - Left atrium: The atrium was moderately dilated. - Atrial septum: No defect or patent foramen ovale was identified.  ------------------------------------------------------------------- Study data:  Comparison was made to the study of 04/20/2015.  Study status:  Routine.  Procedure:  The patient reported no pain pre or post test. Transthoracic echocardiography. Image quality was adequate.  Study completion:  There were no complications. Echocardiography.  M-mode, complete 2D, spectral Doppler, and color Doppler.  Birthdate:  Patient birthdate: January 26, 1955.  Age:  Patient is 64 yr old.  Sex:  Gender: male.    BMI: 38.6 kg/m^2.  Blood pressure:     153/87  Patient status:  Outpatient.  Study date: Study date: 04/14/2016. Study time: 08:54 AM.  Location:  [facility] laboratory.  -------------------------------------------------------------------  ------------------------------------------------------------------- Left ventricle:  The cavity size was normal. Wall thickness was normal. Systolic function was normal. The estimated ejection fraction was in the range of 55% to 60%. Wall motion was normal; there were no regional wall motion abnormalities. Doppler parameters are consistent with abnormal left ventricular relaxation (grade 1 diastolic dysfunction).  ------------------------------------------------------------------- Aortic valve:   Structurally normal valve. Trileaflet. Cusp separation was normal.  Doppler:  Transvalvular velocity was within the normal range. There was no stenosis. There  was mild to moderate regurgitation directed centrally in the LVOT.  ------------------------------------------------------------------- Aorta:  The aorta was poorly visualized. Ascending aorta: The ascending aorta was moderately dilated.  ------------------------------------------------------------------- Mitral valve:   Structurally normal valve.   Leaflet separation was normal.  Doppler:  Transvalvular velocity was within the normal range. There was no evidence for stenosis. There was no regurgitation.    Peak gradient (D): 3 mm Hg.  ------------------------------------------------------------------- Left atrium:  The atrium was moderately dilated.  ------------------------------------------------------------------- Atrial septum:  No defect or patent foramen ovale was identified.   ------------------------------------------------------------------- Right ventricle:  Poorly visualized. The cavity size was normal. Systolic function was normal.  ------------------------------------------------------------------- Pulmonic valve:    Structurally normal valve.   Cusp separation was normal.  Doppler:  Transvalvular velocity was within the normal range. There was no regurgitation.  ------------------------------------------------------------------- Tricuspid valve:   Structurally normal valve.   Leaflet separation was normal.  Doppler:  Transvalvular velocity was within the normal range. There was trivial regurgitation.  ------------------------------------------------------------------- Pulmonary artery:   Systolic pressure was within the normal range.   ------------------------------------------------------------------- Right atrium:  The atrium was normal in size.  ------------------------------------------------------------------- Systemic veins: Inferior vena cava: The vessel was normal in size. The respirophasic diameter changes were in the normal range (=  50%), consistent with normal central venous pressure.  ------------------------------------------------------------------- Post procedure conclusions Ascending Aorta:  - The aorta was poorly visualized.  ------------------------------------------------------------------- Measurements   Left ventricle                          Value        Reference  LV ID, ED, PLAX chordal          (H)    53.5  mm     43 - 52  LV ID, ES, PLAX chordal                 32.1  mm  23 - 38  LV fx shortening, PLAX chordal          40    %      >=29  LV PW thickness, ED                     11.3  mm     ----------  IVS/LV PW ratio, ED                     1.13         <=1.3  Stroke volume, 2D                       134   ml     ----------  Stroke volume/bsa, 2D                   47    ml/m^2 ----------  LV e&', lateral                          6.64  cm/s   ----------  LV E/e&', lateral                        12.68        ----------  LV e&', medial                           7.4   cm/s   ----------  LV E/e&', medial                         11.38        ----------  LV e&', average                          7.02  cm/s   ----------  LV E/e&', average                        11.99        ----------  Longitudinal strain, TDI                18    %      ----------    Ventricular septum                      Value        Reference  IVS thickness, ED                       12.8  mm     ----------    LVOT                                    Value        Reference  LVOT ID, S                              23    mm     ----------  LVOT area                               4.15  cm^2   ----------  LVOT peak velocity, S                   124   cm/s   ----------  LVOT mean velocity, S                   82.3  cm/s   ----------  LVOT VTI, S                             32.3  cm     ----------  LVOT peak gradient, S                   6     mm Hg  ----------    Aortic valve                            Value        Reference   Aortic regurg pressure half-time        816   ms     ----------    Aorta                                   Value        Reference  Aortic root ID, ED                      26    mm     ----------  Ascending aorta ID, A-P, mid, ED (H)    47.4  mm     21 - 34    Left atrium                             Value        Reference  LA ID, A-P, ES                          45    mm     ----------  LA ID/bsa, A-P                          1.59  cm/m^2 <=2.2  LA volume, ES, 1-p A4C                  49.8  ml     ----------  LA volume/bsa, ES, 1-p A4C              17.6  ml/m^2 ----------  LA volume, ES, 1-p A2C                  81.6  ml     ----------  LA volume/bsa, ES, 1-p A2C              28.9  ml/m^2 ----------    Mitral valve                            Value        Reference  Mitral E-wave peak velocity             84.2  cm/s   ----------  Mitral A-wave peak velocity  90.6  cm/s   ----------  Mitral deceleration time                215   ms     150 - 230  Mitral peak gradient, D                 3     mm Hg  ----------  Mitral E/A ratio, peak                  0.9          ----------    Pulmonary arteries                      Value        Reference  PA pressure, S, DP                      25    mm Hg  <=30    Tricuspid valve                         Value        Reference  Tricuspid regurg peak velocity          235   cm/s   ----------  Tricuspid peak RV-RA gradient           22    mm Hg  ----------    Right atrium                            Value        Reference  RA ID, S-I, ES, A4C              (H)    67.8  mm     34 - 49  RA area, ES, A4C                 (H)    24.3  cm^2   8.3 - 19.5  RA volume, ES, A/L                      72.8  ml     ----------  RA volume/bsa, ES, A/L                  25.7  ml/m^2 ----------    Systemic veins                          Value        Reference  Estimated CVP                           3     mm Hg  ----------    Right ventricle                          Value        Reference  TAPSE                                   22    mm     ----------  RV pressure, S, DP  25    mm Hg  <=30  RV s&', lateral, S                       12.9  cm/s   ----------  Legend: (L)  and  (H)  mark values outside specified reference range.  ------------------------------------------------------------------- Prepared and Electronically Authenticated by  Sanda Klein, MD 2018-04-05T14:52:39    Recent Lab Findings: Lab Results  Component Value Date   WBC 5.3 02/18/2016   HGB 13.5 02/18/2016   HCT 39.4 02/18/2016   PLT 199 02/18/2016   GLUCOSE 145 (H) 02/25/2016   ALT 26 09/28/2012   AST 17 09/28/2012   NA 139 02/25/2016   K 4.3 02/25/2016   CL 108 02/25/2016   CREATININE 0.90 02/25/2016   BUN 14 02/25/2016   CO2 25 02/25/2016   INR 1.01 03/05/2014   HGBA1C 5.6 02/18/2016   Aortic Size Index=     4.9 /Body surface area is 2.77 meters squared. = 1.79  < 2.75 cm/m2      4% risk per year 2.75 to 4.25          8% risk per year > 4.25 cm/m2    20% risk per year  Cross-sectional area to height ratio=9.4    Assessment / Plan: #1 stable 4.8 cm ascending thoracic aortic dilatation, not significantly changed from previous exams #2 mild to moderate aortic insufficiency by echocardiogram with normal ejection fraction #3 significant coronary artery calcification suggesting coronary artery disease but without specific symptoms of angina  The patient has reestablish care with cardiology, seeing Dr. Alvester Chou last week  We will plan follow-up CTA of the chest 6 to 7 months  Patient was again cautioned about strenuous lifting, maintaining good blood pressure control, avoiding quinolones.   Grace Isaac MD      [address] [place] [address] Office [phone]   Beeper [phone]  03/14/2019 1:01 PM

## 2019-04-08 ENCOUNTER — Other Ambulatory Visit: Payer: Self-pay | Admitting: Physician Assistant

## 2019-04-08 DIAGNOSIS — G453 Amaurosis fugax: Secondary | ICD-10-CM

## 2019-04-11 ENCOUNTER — Ambulatory Visit
Admission: RE | Admit: 2019-04-11 | Discharge: 2019-04-11 | Disposition: A | Discharge status: Home / Self Care | Payer: BC Managed Care – PPO | Source: Ambulatory Visit | Attending: Physician Assistant | Admitting: Physician Assistant

## 2019-04-11 DIAGNOSIS — G453 Amaurosis fugax: Secondary | ICD-10-CM

## 2019-06-26 ENCOUNTER — Other Ambulatory Visit (HOSPITAL_COMMUNITY): Payer: BC Managed Care – PPO

## 2019-07-04 ENCOUNTER — Other Ambulatory Visit (HOSPITAL_COMMUNITY): Payer: BC Managed Care – PPO

## 2019-08-29 ENCOUNTER — Other Ambulatory Visit: Payer: Self-pay | Admitting: Cardiothoracic Surgery

## 2019-08-29 DIAGNOSIS — I712 Thoracic aortic aneurysm, without rupture, unspecified: Secondary | ICD-10-CM

## 2019-09-26 ENCOUNTER — Other Ambulatory Visit (HOSPITAL_COMMUNITY): Payer: BC Managed Care – PPO

## 2019-10-03 ENCOUNTER — Ambulatory Visit
Admission: RE | Admit: 2019-10-03 | Discharge: 2019-10-03 | Disposition: A | Discharge status: Home / Self Care | Payer: BC Managed Care – PPO | Source: Ambulatory Visit | Attending: Cardiothoracic Surgery | Admitting: Cardiothoracic Surgery

## 2019-10-03 ENCOUNTER — Ambulatory Visit: Payer: BC Managed Care – PPO | Admitting: Cardiothoracic Surgery

## 2019-10-03 ENCOUNTER — Other Ambulatory Visit: Payer: Self-pay

## 2019-10-03 ENCOUNTER — Encounter: Payer: Self-pay | Admitting: Cardiothoracic Surgery

## 2019-10-03 VITALS — BP 134/74 | HR 72 | Temp 97.6°F | Resp 20 | Ht 76.0 in | Wt 277.0 lb

## 2019-10-03 DIAGNOSIS — I712 Thoracic aortic aneurysm, without rupture, unspecified: Secondary | ICD-10-CM

## 2019-10-03 MED ORDER — IOPAMIDOL (ISOVUE-370) INJECTION 76%
75.0000 mL | Freq: Once | INTRAVENOUS | Status: AC | PRN
  Administered 2019-10-03: 75 mL via INTRAVENOUS

## 2019-10-03 NOTE — Progress Notes (Signed)
[address]       [place] [address]             [phone]                    Ralston J Kellman Myers [facility] Medical Record #[id number] Date of Birth: [birth year]  Referring: [name], MD Primary Care: Cyndi Bender, PA-C Primary Cardiologist: No primary care provider on file. Chief Complaint:    Chief Complaint  Patient presents with  . Thoracic Aortic Aneurysm    6 month f/u with Chest CTA    History of Present Illness:    Travis Myers 64 y.o. male who returns today with a follow-up CTA of the chest to evaluate his  ascending aorta .  He has been followed by me since 2016 for a dilated ascending aorta and trileaflet aortic valve .  He was originally seen by me in oct 2016 at the request of Dr. Gwenlyn Found for dilated ascending aorta first noted in 2011.   The dilated aorta was first noted after abnormal preop chest x-ray before hip replacement in 2011, subsequent CT scan of the chest to rule out lung mass showed multiple small pulmonary nodules which are been unchanged over the years. His ascending aorta was noted to be dilated to 4.8 cm. Subsequent follow-up CT scans of the chest have been done in 2013 2014 2015 , fall  2016, 2018 2020 and 2021. An echocardiogram was done in 2014 and repeated 04/2015 and  2018 and 2020. The patient has been asymptomatic from a cardiovascular standpoint. He has no evidence of congestive heart failure. He says these had a history of a murmur since childhood  The patient's had chronic pain in both knees and is planning on bilateral knee replacements next year.  He notes that because of the death of a friend he has made effort to him prove his overall health situation particular about losing weight he notes that over the past 6 months he has dropped his weight from 325 pounds to 277, and overall says he feels better.  .  I reviewed his family history of aortic disease with him - he has no known family history of  connective tissue disorder. He had a grandfather on his mother's side died of" heart disease" at age " 40 , But without any other details other than he died several hours after fishing at [employer] and bringing in a large fish. His father died at age 41 with lung cancer and COPD, he did have history of open repair of abdominal aortic aneurysm at 21.     Patient comes in today with a follow-up CTA of the chest  Current Activity/ Functional Status:  Patient is independent with mobility/ambulation, transfers, ADL's, IADL's.   Zubrod Score: At the time of surgery this patient's most appropriate activity status/level should be described as: [x]     0    Normal activity, no symptoms []     1    Restricted in physical strenuous activity but ambulatory, able to do out light work []     2    Ambulatory and capable of self care, unable to do work activities, up and about               >50 % of waking hours                              []   3    Only limited self care, in bed greater than 50% of waking hours []     4    Completely disabled, no self care, confined to bed or chair []     5    Moribund     Past Surgical History:  Procedure Laterality Date  . [place]  . DOPPLER ECHOCARDIOGRAPHY  10/18/2011   EF >55 %  . LUMBAR LAMINECTOMY/DECOMPRESSION MICRODISCECTOMY N/A 02/24/2016   Procedure: Bilateral Decompression L4-5, Discectomy L4-5 ;  Surgeon: Susa Day, MD;  Location: [facility];  Service: Orthopedics;  Laterality: N/A;  . NM MYOCAR PERF WALL MOTION  10/13/2011   EF 49 %,low risk study  . TONSILLECTOMY  1965  . torn meniscus left knee surgery     . TOTAL HIP ARTHROPLASTY  06-18-2009   OA LEFT HIP  . TOTAL HIP ARTHROPLASTY Right 03/11/2014   Procedure: RIGHT TOTAL HIP ARTHROPLASTY ANTERIOR APPROACH;  Surgeon: [name], MD;  Location: [facility];  Service: Orthopedics;  Laterality: Right;  . TRANSTHORACIC ECHOCARDIOGRAM  09/11/2012   EF 55 to 60 %,mild aortic  valve regurg,mild mitral valve regurg,lf and rt atriums mildly dilated  . VARICOSE VEIN SURGERY Left 1980  . veins removed from left leg  1994    Family History  Problem Relation Age of Onset  . Diabetes Mother   . Cancer Mother   . Heart failure Other   . Diabetes Other   . Cancer Other   . COPD Other   . Colon cancer Neg Hx   . Stomach cancer Neg Hx     Social History   Socioeconomic History  . Marital status: Married    Spouse name: Not on file  . Number of children: Not on file  . Years of education: Not on file  . Highest education level: Not on file  Occupational History  . Not on file  Social Needs  . Financial resource strain: Not on file  . Food insecurity:    Worry: Not on file    Inability: Not on file  . Transportation needs:    Medical: Not on file    Non-medical: Not on file  Tobacco Use  . Smoking status: Never Smoker  . Smokeless tobacco: Former Network engineer and Sexual Activity  . Alcohol use: Yes    Alcohol/week: 1.0 standard drinks    Types: 1 Standard drinks or equivalent per week    Comment: RARE  . Drug use: No    Social History   Tobacco Use  Smoking Status Never Smoker  Smokeless Tobacco Former Systems developer    Social History   Substance and Sexual Activity  Alcohol Use Yes  . Alcohol/week: 1.0 standard drink  . Types: 1 Standard drinks or equivalent per week   Comment: RARE     Allergies  Allergen Reactions  . Penicillins Other (See Comments)    Childhood reaction.  Has patient had a PCN reaction causing immediate rash, facial/tongue/throat swelling, SOB or lightheadedness with hypotension:unsure Has patient had a PCN reaction causing severe rash involving mucus membranes or skin necrosis:unsure Has patient had a PCN reaction that required hospitalization:unsure Has patient had a PCN reaction occurring within the last 10 years:No If all of the above answers are "NO", then may proceed with Cephalosporin use.   . Quinolones      Patient was warned about not using Cipro and similar antibiotics. Recent studies have raised concern that fluoroquinolone antibiotics could be associated  with an increased risk of aortic aneurysm Fluoroquinolones have non-antimicrobial properties that might jeopardise the integrity of the extracellular matrix of the vascular wall In a  propensity score matched cohort study in [place], there was a 66% increased rate of aortic aneurysm or dissection associated with oral fluoroquinolone use, compared wit    Current Outpatient Medications  Medication Sig Dispense Refill  . aspirin EC 81 MG tablet Take 1 tablet (81 mg total) by mouth daily. Resume 4 days post-op    . ATORVASTATIN CALCIUM PO Take 1 tablet by mouth daily.    . Dermatological Products, Misc. (MOISTURE) CREA Apply 1-2 application topically 2 (two) times daily as needed (for dry skin). [employer] Emergency planning/management officer    . furosemide (LASIX) 20 MG tablet Take 40 mg by mouth daily.  5  . lisinopril (PRINIVIL,ZESTRIL) 40 MG tablet TAKE 1 TABLET BY MOUTH EVERY DAY FOR BLOOD PRESSURE  5  . meclizine (ANTIVERT) 25 MG tablet Take 25 mg by mouth daily.  0  . traMADol (ULTRAM) 50 MG tablet Take 1 tablet by mouth as directed.  2  . amLODipine (NORVASC) 10 MG tablet Take 1 tablet (10 mg total) daily by mouth. 90 tablet 3   No current facility-administered medications for this visit.    Review of Systems:  ROS    Physical Exam: BP 134/74   Pulse 72   Temp 97.6 F (36.4 C) (Skin)   Resp 20   Ht 6\' 4"  (1.93 m)   Wt 277 lb (125.6 kg)   SpO2 96% Comment: RA  BMI 33.72 kg/m   PHYSICAL EXAMINATION: General appearance: alert and no distress Neck: no adenopathy, no carotid bruit, no JVD, supple, symmetrical, trachea midline and thyroid not enlarged, symmetric, no tenderness/mass/nodules Resp: clear to auscultation bilaterally Cardio: regular rate and rhythm, S1, S2 normal, no murmur, click, rub or gallop GI: soft, non-tender; bowel sounds normal;  no masses,  no organomegaly Extremities: extremities normal, atraumatic, no cyanosis or edema and Homans sign is negative, no sign of DVT Neurologic: Grossly normal   Diagnostic Studies & Laboratory data:     Recent Radiology Findings:  CT ANGIO CHEST AORTA W/CM & OR WO/CM  Result Date: 10/03/2019 CLINICAL DATA:  Thoracic aortic aneurysm, follow-up EXAM: CT ANGIOGRAPHY CHEST WITH CONTRAST TECHNIQUE: Multidetector CT imaging of the chest was performed using the standard protocol during bolus administration of intravenous contrast. Multiplanar CT image reconstructions and MIPs were obtained to evaluate the vascular anatomy. CONTRAST:  70mL ISOVUE-370 IOPAMIDOL (ISOVUE-370) INJECTION 76% COMPARISON:  03/14/2019 FINDINGS: Cardiovascular: 4.5 cm ascending thoracic aortic aneurysm. Aortic arch and descending thoracic aorta normal caliber. Extensive coronary artery calcifications. Heart is normal size. Mediastinum/Nodes: No mediastinal, hilar, or axillary adenopathy. Trachea and esophagus are unremarkable. 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 Right thyroid lobe unremarkable. Lungs/Pleura: Lungs are clear. No focal airspace opacities or suspicious nodules. No effusions. Upper Abdomen: Imaging into the upper abdomen demonstrates no acute findings. Musculoskeletal: Chest wall soft tissues are unremarkable. No acute bony abnormality. Review of the MIP images confirms the above findings. IMPRESSION: Ascending thoracic aorta measures 4.5 cm maximally on today's study. Recommend semi-annual imaging followup by CTA or MRA and referral to cardiothoracic surgery if not already obtained. This recommendation follows 2010 ACCF/AHA/AATS/ACR/ASA/SCA/SCAI/SIR/STS/SVM Guidelines for the Diagnosis and Management of Patients With Thoracic Aortic Disease. Circulation. 2010; 121: X937-J696. Aortic aneurysm NOS (ICD10-I71.9) Coronary artery disease. No acute cardiopulmonary disease. Electronically Signed   By: Rolm Baptise M.D.   On:  10/03/2019 10:17   I  have independently reviewed the above radiology studies  and reviewed the findings with the patient.  CLINICAL DATA:  Thoracic aortic aneurysm without rupture.  EXAM: CT ANGIOGRAPHY CHEST WITH CONTRAST  TECHNIQUE: Multidetector CT imaging of the chest was performed using the standard protocol during bolus administration of intravenous contrast. Multiplanar CT image reconstructions and MIPs were obtained to evaluate the vascular anatomy.  CONTRAST:  40mL ISOVUE-370 IOPAMIDOL (ISOVUE-370) INJECTION 76%  COMPARISON:  CT scan of October 12, 2017.  FINDINGS: Cardiovascular: Grossly stable 4.9 cm ascending thoracic aortic aneurysm is noted without dissection. Aortic arch measures 2.9 cm. Proximal descending thoracic aorta measures 2.8 cm. Great vessels are widely patent without significant stenosis. Normal cardiac size. No pericardial effusion is noted. Coronary artery calcifications are noted.  Mediastinum/Nodes: No enlarged mediastinal, hilar, or axillary lymph nodes. Thyroid gland, trachea, and esophagus demonstrate no significant findings.  Lungs/Pleura: Lungs are clear. No pleural effusion or pneumothorax.  Upper Abdomen: No acute abnormality.  Musculoskeletal: No chest wall abnormality. No acute or significant osseous findings.  Review of the MIP images confirms the above findings.  IMPRESSION: Grossly stable 4.9 cm ascending thoracic aortic aneurysm. Recommend semi-annual imaging followup by CTA or MRA and referral to cardiothoracic surgery if not already obtained. This recommendation follows 2010 ACCF/AHA/AATS/ACR/ASA/SCA/SCAI/SIR/STS/SVM Guidelines for the Diagnosis and Management of Patients With Thoracic Aortic Disease. Circulation. 2010; 121: D638-V564. Aortic aneurysm NOS (ICD10-I71.9).  Coronary artery calcifications are noted.  Aortic aneurysm NOS (ICD10-I71.9).   Electronically Signed   By: Marijo Conception M.D.   On:  07/05/2018 13:52   Ct Angio Chest Aorta W/cm &/or Wo/cm  Result Date: 04/14/2016 CLINICAL DATA:  Thoracic aortic aneurysm without rupture. EXAM: CT ANGIOGRAPHY CHEST WITH CONTRAST TECHNIQUE: Multidetector CT imaging of the chest was performed using the standard protocol during bolus administration of intravenous contrast. Multiplanar CT image reconstructions and MIPs were obtained to evaluate the vascular anatomy. CONTRAST:  75 mL of Isovue 370 intravenously. COMPARISON:  CT scan of April 23, 2015. FINDINGS: Cardiovascular: Stable 4.8 cm ascending thoracic aortic aneurysm is noted. Great vessels are widely patent without significant stenosis. Transverse aortic arch measures 2.9 cm. Proximal descending thoracic aorta measures 2.8 cm. Coronary artery calcifications are noted. Normal cardiac size. No pericardial effusion is noted. Mediastinum/Nodes: No enlarged mediastinal, hilar, or axillary lymph nodes. Thyroid gland, trachea, and esophagus demonstrate no significant findings. Lungs/Pleura: Lungs are clear. No pleural effusion or pneumothorax. Upper Abdomen: No acute abnormality. Musculoskeletal: No chest wall abnormality. No acute or significant osseous findings. Review of the MIP images confirms the above findings. IMPRESSION: Stable 4.8 cm ascending thoracic aortic aneurysm. Ascending thoracic aortic aneurysm. Recommend semi-annual imaging followup by CTA or MRA and referral to cardiothoracic surgery if not already obtained. This recommendation follows 2010 ACCF/AHA/AATS/ACR/ASA/SCA/SCAI/SIR/STS/SVM Guidelines for the Diagnosis and Management of Patients With Thoracic Aortic Disease. Circulation. 2010; 121: P329-J188. Coronary artery calcifications suggesting coronary artery disease. Electronically Signed   By: Marijo Conception, M.D.   On: 04/14/2016 14:04    Ct Angio Chest Aorta W/cm &/or Wo/cm  04/23/2015  CLINICAL DATA:  Thoracic aortic aneurysm without rupture. EXAM: CT ANGIOGRAPHY CHEST WITH CONTRAST  TECHNIQUE: Multidetector CT imaging of the chest was performed using the standard protocol during bolus administration of intravenous contrast. Multiplanar CT image reconstructions and MIPs were obtained to evaluate the vascular anatomy. CONTRAST:  75 mL of Isovue 370 intravenously. COMPARISON:  CT scan of October 02, 2014. FINDINGS: No pneumothorax or pleural effusion is noted. No acute pulmonary disease  is noted. Coronary artery calcifications are noted. Aortic root measures 3.3 cm in diameter. Ascending thoracic aorta has a maximum measured diameter 4.8 cm which is not significantly changed compared to prior exam based on my own measurement. Transverse aortic arch measures 2.9 cm in diameter. Descending thoracic aorta measures 2.6 cm in diameter. No dissection is noted. No mediastinal mass or adenopathy is noted. Visualized portion of upper abdomen is unremarkable. No significant osseous abnormality is noted. Review of the MIP images confirms the above findings. IMPRESSION: Coronary artery calcifications suggesting coronary artery disease. 4.8 cm ascending thoracic aortic aneurysm is noted. This is not significantly changed compared to prior exam. Recommend semi-annual imaging followup by CTA or MRA and referral to cardiothoracic surgery if not already obtained. This recommendation follows 2010 ACCF/AHA/AATS/ACR/ASA/SCA/SCAI/SIR/STS/SVM Guidelines for the Diagnosis and Management of Patients With Thoracic Aortic Disease. Circulation. 2010; 121: I264-B583. Electronically Signed   By: Marijo Conception, M.D.   On: 04/23/2015 14:31  Ct Angio Chest Aorta W/cm &/or Wo/cm  10/02/2014   CLINICAL DATA:  History of thoracic aortic aneurysm 3 years, followup  EXAM: CT ANGIOGRAPHY CHEST WITH CONTRAST  TECHNIQUE: Multidetector CT imaging of the chest was performed using the standard protocol during bolus administration of intravenous contrast. Multiplanar CT image reconstructions and MIPs were obtained to evaluate the  vascular anatomy.  CONTRAST:  75 cc Isovue 370  COMPARISON:  CT angio chest of 10/04/2013  FINDINGS: There is again fusiform dilatation of the ascending aorta noted. Measured in the same planes as measured previously, the mid thoracic ascending aorta measures 4.8 x 5.0 cm compared to 4.8 x 4.9 cm previously. Cardiothoracic surgery consultation recommended due to increased risk of rupture for arch aneurysm ? 5.5 cm. This recommendation follows 2010 ACCF/AHA/AATS/ACR/ASA/SCA/SCAI/SIR/STS/SVM Guidelines for the Diagnosis and Management of Patients With Thoracic Aortic Disease. Circulation. 2010; 121: E940-H680. The mid thoracic aortic arch measures 2.7 cm. The mid descending thoracic aorta measured at the same level as the ascending aorta measures 2.6 cm. The distal thoracic aorta just above the diaphragmatic screw measures 2.7 cm. These measurements appear unchanged.  No mediastinal or hilar adenopathy is seen. The pulmonary arteries are not well opacified cannot and cannot be assessed. Extensive coronary artery calcifications are present primarily involving the left anterior descending artery and circumflex artery. Mild cardiomegaly is stable. 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 Correlation with surgical history is recommended. There are degenerative changes throughout the mid to lower thoracic spine.  On lung window images, no lung parenchymal abnormality is seen. No suspicious lung nodule is seen and there is no evidence of pleural effusion. The central airway is patent.  Review of the MIP images confirms the above findings.  IMPRESSION: 1. The mid ascending thoracic aorta measures 4.8 x 5.0 cm compared to 4.8 x 4.9 cm previously. Cardiothoracic surgery consultation recommended due to increased risk of rupture for arch aneurysm ? 5.5 cm. This recommendation follows 2010 ACCF/AHA/AATS/ACR/ASA/SCA/SCAI/SIR/STS/SVM Guidelines for the Diagnosis and  Management of Patients With Thoracic Aortic Disease. Circulation. 2010; 121: S811-S315 . 2. Significant coronary artery calcifications primarily involving the left anterior descending and circumflex coronary arteries.   Electronically Signed   By: Ivar Drape M.D.   On: 10/02/2014 09:17   2020 ECHOCARDIOGRAM REPORT       Patient Name:   SHAURYA RAWDON Myers Date of Exam: 07/03/2018 Medical Rec #:  [id number]  Height:       76.5 in Accession #:    [id number]             Weight:       305.0 lb Date of Birth:  04/04/1955              BSA:          2.66 m Patient Age:    59 years               BP:           121/73 mmHg Patient Gender: M                      HR:           65 bpm. Exam Location:  [facility]    Procedure: 2D Echo, 3D Echo, Cardiac Doppler and Color Doppler  Indications:    I35.9 Aortic Valve Disorder   History:        Patient has prior history of Echocardiogram examinations, most                 recent 04/14/2016. Signs/Symptoms: Murmur Risk Factors:                 Hypertension, Dyslipidemia and Sleep Apnea. Edema, Ascending                 Aortic Aneurysm (prior 4.9 cm).   Sonographer:    Deliah Boston RDCS Referring Phys: [phone] EDWARD B GERHARDT  IMPRESSIONS    1. The left ventricle has normal systolic function with an ejection fraction of 60-65%. The cavity size was normal. Left ventricular diastolic Doppler parameters are consistent with impaired relaxation. Indeterminate filling pressures The E/e' is 8-15.  No evidence of left ventricular regional wall motion abnormalities.  2. The right ventricle has normal systolic function. The cavity was normal. There is no increase in right ventricular wall thickness.  3. Right atrial size was mildly dilated.  4. The mitral valve is grossly normal.  5. The tricuspid valve is grossly normal.  6. The aortic valve is abnormal. Aortic valve regurgitation is mild to moderate by color flow Doppler. No stenosis  of the aortic valve.  7. There is moderate to severe dilatation of the ascending aorta measuring 49 mm.  8. When compared to the prior study: 04/14/2016: LVEF 55-60%, dilated ascending aorta to 4.9 cm.  SUMMARY   LVEF 60-65%, normal wall thickness, normal wall motion, grade 1 DD indeterminate LV filling pressure, dilated ascending aorta to 4.9cm, mild to moderate AI, trivial TR, normal RVSP, normal IVC  FINDINGS  Left Ventricle: The left ventricle has normal systolic function, with an ejection fraction of 60-65%. The cavity size was normal. There is no increase in left ventricular wall thickness. Left ventricular diastolic Doppler parameters are consistent with  impaired relaxation. Indeterminate filling pressures The E/e' is 8-15. No evidence of left ventricular regional wall motion abnormalities..  Right Ventricle: The right ventricle has normal systolic function. The cavity was normal. There is no increase in right ventricular wall thickness.  Left Atrium: Left atrial size was normal in size.  Right Atrium: Right atrial size was mildly dilated. Right atrial pressure is estimated at 3 mmHg.  Interatrial Septum: No atrial level shunt detected by color flow Doppler.  Pericardium: There is no evidence of pericardial effusion.  Mitral Valve: The mitral valve is grossly normal. Mitral valve regurgitation is not visualized by color flow Doppler.  Tricuspid Valve: The tricuspid valve is grossly normal. Tricuspid valve regurgitation is trivial by color flow Doppler.  Aortic Valve: The aortic valve is abnormal Aortic valve regurgitation is mild to moderate by color flow Doppler. There is No stenosis of the aortic valve, with a calculated valve area of 2.67 cm.  Pulmonic Valve: The pulmonic valve was grossly normal. Pulmonic valve regurgitation is not visualized by color flow Doppler.  Aorta: There is moderate to severe dilatation of the ascending aorta measuring 49 mm.  Venous:  The inferior vena cava measures 1.60 cm, is normal in size with greater than 50% respiratory variability.  Compared to previous exam: 04/14/2016: LVEF 55-60%, dilated ascending aorta to 4.9 cm.    +--------------+--------++ LEFT VENTRICLE         +----------------+----------++ +--------------+--------++ Diastology                 PLAX 2D                +----------------+----------++ +--------------+--------++ LV e' lateral:  9.32 cm/s  LVIDd:        5.46 cm  +----------------+----------++ +--------------+--------++ LV E/e' lateral:10.6       LVIDs:        3.10 cm  +----------------+----------++ +--------------+--------++ LV e' medial:   10.10 cm/s LV PW:        1.07 cm  +----------------+----------++ +--------------+--------++ LV E/e' medial: 9.8        LV IVS:       0.83 cm  +----------------+----------++ +--------------+--------++ LVOT diam:    2.40 cm  +--------------+--------++ LV SV:        107 ml   +--------------+--------++ LV SV Index:  38.55    +--------------+--------++ LVOT Area:    4.52 cm +--------------+--------++                        +--------------+--------++  +---------------+----------++ RIGHT VENTRICLE           +---------------+----------++ RV S prime:    15.10 cm/s +---------------+----------++ TAPSE (M-mode):3.7 cm     +---------------+----------++ RVSP:          20.0 mmHg  +---------------+----------++  +---------------+-------++-----------++ LEFT ATRIUM           Index       +---------------+-------++-----------++ LA diam:       4.30 cm1.61 cm/m  +---------------+-------++-----------++ LA Vol (A2C):  66.1 ml24.82 ml/m +---------------+-------++-----------++ LA Vol (A4C):  83.9 ml31.50 ml/m +---------------+-------++-----------++ LA Biplane Vol:75.0 ml28.16 ml/m +---------------+-------++-----------++  +------------+---------++-----------++ RIGHT ATRIUM         Index       +------------+---------++-----------++ RA Pressure:3.00 mmHg            +------------+---------++-----------++ RA Area:    28.40 cm            +------------+---------++-----------++ RA Volume:  95.50 ml 35.86 ml/m +------------+---------++-----------++  +------------------+------------++ AORTIC VALVE                   +------------------+------------++ AV Area (Vmax):   2.60 cm     +------------------+------------++ AV Area (Vmean):  2.66 cm     +------------------+------------++ AV Area (VTI):    2.67 cm     +------------------+------------++ AV Vmax:          195.00 cm/s  +------------------+------------++ AV Vmean:         132.667 cm/s +------------------+------------++ AV VTI:           0.488 m      +------------------+------------++ AV Peak Grad:  15.2 mmHg    +------------------+------------++ AV Mean Grad:     8.3 mmHg     +------------------+------------++ LVOT Vmax:        112.00 cm/s  +------------------+------------++ LVOT Vmean:       77.900 cm/s  +------------------+------------++ LVOT VTI:         0.288 m      +------------------+------------++ LVOT/AV VTI ratio:0.59         +------------------+------------++ AR PHT:           479 msec     +------------------+------------++   +-------------+-------++ AORTA                +-------------+-------++ Ao Root diam:3.10 cm +-------------+-------++ Ao Asc diam: 4.85 cm +-------------+-------++  +--------------+--------++    +---------------+-----------++ MITRAL VALVE              TRICUSPID VALVE            +--------------+--------++    +---------------+-----------++ MV Area (PHT):cm         TR Peak grad:  17.0 mmHg   +--------------+--------++    +---------------+-----------++ MV PHT:       msec        TR Vmax:        206.00 cm/s +--------------+--------++    +---------------+-----------++ MV Decel Time:317 msec    Estimated RAP: 3.00 mmHg   +--------------+--------++    +---------------+-----------++ +--------------+-----------++ RVSP:          20.0 mmHg   MV E velocity:98.50 cm/s  +---------------+-----------++ +--------------+-----------++ MV A velocity:118.00 cm/s +--------------+-------+ +--------------+-----------++ SHUNTS                MV E/A ratio: 0.83        +--------------+-------+ +--------------+-----------++ Systemic VTI: 0.29 m                                +--------------+-------+                               Systemic Diam:2.40 cm                               +--------------+-------+  +---------+-------+ IVC              +---------+-------+ IVC diam:1.60 cm +---------+-------+    Lyman Bishop MD Electronically signed by Lyman Bishop MD Signature Date/Time: 07/03/2018/1:16:55 PM       ECHO 04/13/2016 Echocardiography  Patient:    Kairi, Tufo MR #:       [id number] Study Date: 04/14/2016 Gender:     M Age:        57 Height:     193 cm Weight:     143.8 kg BSA:        2.83 m^2 Pt. Status: Room:   ATTENDING    Lanelle Bal MD  ORDERING     Lanelle Bal MD  [place] MD  PERFORMING   [facility], Outpatient  SONOGRAPHER  Mikki Santee  cc:  ------------------------------------------------------------------- LV EF: 55% -   60%  ------------------------------------------------------------------- Indications:      Aneurysm - thoracic 441.2.  ------------------------------------------------------------------- History:   PMH:  Aneurysm of the Ascending Aorta.  ------------------------------------------------------------------- Study Conclusions  - Left ventricle: The cavity size was normal. Wall thickness was   normal. Systolic function was normal. The estimated  ejection    fraction was in the range of 55% to 60%. Wall motion was normal;   there were no regional wall motion abnormalities. Doppler   parameters are consistent with abnormal left ventricular   relaxation (grade 1 diastolic dysfunction). - Aortic valve: There was mild to moderate regurgitation directed   centrally in the LVOT. - Left atrium: The atrium was moderately dilated. - Atrial septum: No defect or patent foramen ovale was identified.  ------------------------------------------------------------------- Study data:  Comparison was made to the study of 04/20/2015.  Study status:  Routine.  Procedure:  The patient reported no pain pre or post test. Transthoracic echocardiography. Image quality was adequate.  Study completion:  There were no complications. Echocardiography.  M-mode, complete 2D, spectral Doppler, and color Doppler.  Birthdate:  Patient birthdate: 1955-12-06.  Age:  Patient is 64 yr old.  Sex:  Gender: male.    BMI: 38.6 kg/m^2.  Blood pressure:     153/87  Patient status:  Outpatient.  Study date: Study date: 04/14/2016. Study time: 08:54 AM.  Location:  Echo laboratory.  -------------------------------------------------------------------  ------------------------------------------------------------------- Left ventricle:  The cavity size was normal. Wall thickness was normal. Systolic function was normal. The estimated ejection fraction was in the range of 55% to 60%. Wall motion was normal; there were no regional wall motion abnormalities. Doppler parameters are consistent with abnormal left ventricular relaxation (grade 1 diastolic dysfunction).  ------------------------------------------------------------------- Aortic valve:   Structurally normal valve. Trileaflet. Cusp separation was normal.  Doppler:  Transvalvular velocity was within the normal range. There was no stenosis. There was mild to moderate regurgitation directed centrally in the LVOT.   ------------------------------------------------------------------- Aorta:  The aorta was poorly visualized. Ascending aorta: The ascending aorta was moderately dilated.  ------------------------------------------------------------------- Mitral valve:   Structurally normal valve.   Leaflet separation was normal.  Doppler:  Transvalvular velocity was within the normal range. There was no evidence for stenosis. There was no regurgitation.    Peak gradient (D): 3 mm Hg.  ------------------------------------------------------------------- Left atrium:  The atrium was moderately dilated.  ------------------------------------------------------------------- Atrial septum:  No defect or patent foramen ovale was identified.   ------------------------------------------------------------------- Right ventricle:  Poorly visualized. The cavity size was normal. Systolic function was normal.  ------------------------------------------------------------------- Pulmonic valve:    Structurally normal valve.   Cusp separation was normal.  Doppler:  Transvalvular velocity was within the normal range. There was no regurgitation.  ------------------------------------------------------------------- Tricuspid valve:   Structurally normal valve.   Leaflet separation was normal.  Doppler:  Transvalvular velocity was within the normal range. There was trivial regurgitation.  ------------------------------------------------------------------- Pulmonary artery:   Systolic pressure was within the normal range.   ------------------------------------------------------------------- Right atrium:  The atrium was normal in size.  ------------------------------------------------------------------- Systemic veins: Inferior vena cava: The vessel was normal in size. The respirophasic diameter changes were in the normal range (= 50%), consistent with normal central venous pressure.   ------------------------------------------------------------------- Post procedure conclusions Ascending Aorta:  - The aorta was poorly visualized.  ------------------------------------------------------------------- Measurements   Left ventricle                          Value        Reference  LV ID, ED, PLAX chordal          (H)    53.5  mm     43 - 52  LV ID, ES, PLAX chordal  32.1  mm     23 - 38  LV fx shortening, PLAX chordal          40    %      >=29  LV PW thickness, ED                     11.3  mm     ----------  IVS/LV PW ratio, ED                     1.13         <=1.3  Stroke volume, 2D                       134   ml     ----------  Stroke volume/bsa, 2D                   47    ml/m^2 ----------  LV e&', lateral                          6.64  cm/s   ----------  LV E/e&', lateral                        12.68        ----------  LV e&', medial                           7.4   cm/s   ----------  LV E/e&', medial                         11.38        ----------  LV e&', average                          7.02  cm/s   ----------  LV E/e&', average                        11.99        ----------  Longitudinal strain, TDI                18    %      ----------    Ventricular septum                      Value        Reference  IVS thickness, ED                       12.8  mm     ----------    LVOT                                    Value        Reference  LVOT ID, S                              23    mm     ----------  LVOT area  4.15  cm^2   ----------  LVOT peak velocity, S                   124   cm/s   ----------  LVOT mean velocity, S                   82.3  cm/s   ----------  LVOT VTI, S                             32.3  cm     ----------  LVOT peak gradient, S                   6     mm Hg  ----------    Aortic valve                            Value        Reference  Aortic regurg pressure half-time        816   ms      ----------    Aorta                                   Value        Reference  Aortic root ID, ED                      26    mm     ----------  Ascending aorta ID, A-P, mid, ED (H)    47.4  mm     21 - 34    Left atrium                             Value        Reference  LA ID, A-P, ES                          45    mm     ----------  LA ID/bsa, A-P                          1.59  cm/m^2 <=2.2  LA volume, ES, 1-p A4C                  49.8  ml     ----------  LA volume/bsa, ES, 1-p A4C              17.6  ml/m^2 ----------  LA volume, ES, 1-p A2C                  81.6  ml     ----------  LA volume/bsa, ES, 1-p A2C              28.9  ml/m^2 ----------    Mitral valve                            Value        Reference  Mitral E-wave peak velocity             84.2  cm/s   ----------  Mitral A-wave peak velocity  90.6  cm/s   ----------  Mitral deceleration time                215   ms     150 - 230  Mitral peak gradient, D                 3     mm Hg  ----------  Mitral E/A ratio, peak                  0.9          ----------    Pulmonary arteries                      Value        Reference  PA pressure, S, DP                      25    mm Hg  <=30    Tricuspid valve                         Value        Reference  Tricuspid regurg peak velocity          235   cm/s   ----------  Tricuspid peak RV-RA gradient           22    mm Hg  ----------    Right atrium                            Value        Reference  RA ID, S-I, ES, A4C              (H)    67.8  mm     34 - 49  RA area, ES, A4C                 (H)    24.3  cm^2   8.3 - 19.5  RA volume, ES, A/L                      72.8  ml     ----------  RA volume/bsa, ES, A/L                  25.7  ml/m^2 ----------    Systemic veins                          Value        Reference  Estimated CVP                           3     mm Hg  ----------    Right ventricle                         Value        Reference  TAPSE                                    22    mm     ----------  RV pressure, S, DP  25    mm Hg  <=30  RV s&', lateral, S                       12.9  cm/s   ----------  Legend: (L)  and  (H)  mark values outside specified reference range.  ------------------------------------------------------------------- Prepared and Electronically Authenticated by  Sanda Klein, MD 2018-04-05T14:52:39    Recent Lab Findings: Lab Results  Component Value Date   WBC 5.3 02/18/2016   HGB 13.5 02/18/2016   HCT 39.4 02/18/2016   PLT 199 02/18/2016   GLUCOSE 145 (H) 02/25/2016   ALT 26 09/28/2012   AST 17 09/28/2012   NA 139 02/25/2016   K 4.3 02/25/2016   CL 108 02/25/2016   CREATININE 0.90 02/25/2016   BUN 14 02/25/2016   CO2 25 02/25/2016   INR 1.01 03/05/2014   HGBA1C 5.6 02/18/2016   Aortic Size Index=     4.8 /Body surface area is 2.6 meters squared. = 1.79  < 2.75 cm/m2      4% risk per year 2.75 to 4.25          8% risk per year > 4.25 cm/m2    20% risk per year  Cross-sectional area to height ratio=9.4    Assessment / Plan: #1 stable 4.8 ( 4.5 on measurement today ) cm ascending thoracic aortic dilatation, not significantly changed from previous exams  #2 mild to moderate aortic insufficiency by echocardiogram with normal ejection fraction-last echo 2020.  Patient will discuss with Dr. Alvester Chou on his next appointment follow-up echo and preoperative clearance for bilateral knee replacement early next year  #3 significant coronary artery calcification suggesting coronary artery disease but without specific symptoms of angina   We will plan follow-up CTA of the chest 6  Months,    Patient was again cautioned about strenuous lifting, maintaining good blood pressure control, avoiding quinolones.   Grace Isaac MD      [address] [place] [address] Office [phone]   Beeper [phone]  10/03/2019 11:30 AM

## 2019-12-17 ENCOUNTER — Telehealth: Payer: Self-pay | Admitting: *Deleted

## 2019-12-17 NOTE — Telephone Encounter (Signed)
   [facility] Pre-operative Risk Assessment    HEARTCARE STAFF: - Please ensure there is not already an duplicate clearance open for this procedure. - Under Visit Info/Reason for Call, type in Other and utilize the format Clearance MM/DD/YY or Clearance TBD. Do not use dashes or single digits. - If request is for dental extraction, please clarify the # of teeth to be extracted.  Request for surgical clearance:  1. What type of surgery is being performed? Right TKA   2. When is this surgery scheduled? TBD   3. What type of clearance is required (medical clearance vs. Pharmacy clearance to hold med vs. Both)? medical  4. Are there any medications that need to be held prior to surgery and how long?none   5. Practice name and name of physician performing surgery? [facility]   6. What is the office phone number? [phone]   7.   What is the office fax number? [phone]  8.   Anesthesia type (None, local, MAC, general) ? Spinal with adduction    Fredia Beets 12/17/2019, 7:19 AM  _________________________________________________________________   (provider comments below)

## 2019-12-17 NOTE — Telephone Encounter (Signed)
Pt has been scheduled to see Loma Boston, PA-C 01/07/2020 and surgery clearance will be addressed at that time.  Will route to the requesting surgeon's office to make them aware.

## 2019-12-17 NOTE — Telephone Encounter (Signed)
   Primary Cardiologist: Dr Gwenlyn Found  Chart reviewed as part of pre-operative protocol coverage. Because of HANSFORD HIRT II's past medical history and time since last visit, he will require a follow-up visit in order to better assess preoperative cardiovascular risk.  Pre-op covering staff: - Please schedule appointment to see Dr Gwenlyn Found after his echo scheduled for 01/06/2020 and call patient to inform them. If patient already had an upcoming appointment within acceptable timeframe, please add "pre-op clearance" to the appointment notes so provider is aware.  - Please contact requesting surgeon's office via preferred method (i.e, phone, fax) to inform them of need for appointment prior to surgery.  If applicable, this message will also be routed to pharmacy pool and/or primary cardiologist for input on holding anticoagulant/antiplatelet agent as requested below so that this information is available to the clearing provider at time of patient's appointment.   [name], PA-C  12/17/2019, 8:49 AM

## 2020-01-06 ENCOUNTER — Other Ambulatory Visit: Payer: Self-pay

## 2020-01-06 ENCOUNTER — Ambulatory Visit (HOSPITAL_COMMUNITY): Payer: BC Managed Care – PPO | Attending: Cardiology

## 2020-01-06 DIAGNOSIS — I351 Nonrheumatic aortic (valve) insufficiency: Secondary | ICD-10-CM | POA: Insufficient documentation

## 2020-01-06 LAB — ECHOCARDIOGRAM COMPLETE
AR max vel: 1.67 cm2
AV Area VTI: 1.62 cm2
AV Area mean vel: 1.66 cm2
AV Mean grad: 11 mmHg
AV Peak grad: 21.7 mmHg
Ao pk vel: 2.33 m/s
Area-P 1/2: 2.29 cm2
P 1/2 time: 525 msec
S' Lateral: 3.6 cm

## 2020-01-07 ENCOUNTER — Ambulatory Visit: Payer: BC Managed Care – PPO | Admitting: Cardiology

## 2020-01-07 ENCOUNTER — Encounter: Payer: Self-pay | Admitting: Cardiology

## 2020-01-07 VITALS — BP 130/72 | HR 67 | Ht 76.0 in | Wt 294.6 lb

## 2020-01-07 DIAGNOSIS — I351 Nonrheumatic aortic (valve) insufficiency: Secondary | ICD-10-CM

## 2020-01-07 DIAGNOSIS — I10 Essential (primary) hypertension: Secondary | ICD-10-CM

## 2020-01-07 DIAGNOSIS — Z01818 Encounter for other preprocedural examination: Secondary | ICD-10-CM

## 2020-01-07 DIAGNOSIS — E782 Mixed hyperlipidemia: Secondary | ICD-10-CM

## 2020-01-07 DIAGNOSIS — I712 Thoracic aortic aneurysm, without rupture, unspecified: Secondary | ICD-10-CM

## 2020-01-07 NOTE — Assessment & Plan Note (Signed)
Pt is an acceptable risk for the proposed procedure without further cardiac work-up. Official clearance will be faxed to Dr. Thomasena Edis via epic.

## 2020-01-07 NOTE — Progress Notes (Signed)
Cardiology Office Note:    Date:  01/07/2020   ID:  DEZMAN SCHOENING, DOB 01/08/1956, MRN [id number]  PCP:  Cyndi Bender, PA-C  Cardiologist:  No primary care provider on file.  Electrophysiologist:  None   Referring MD: Cyndi Bender, PA-C   CC: pre op clearance  History of Present Illness:    Travis Myers is a 64 y.o. male who works for [employer] He has a history of an ascending aortic aneurysm with mild to moderate aortic regurgitation. This is been stable. He is followed by Dr. Servando Snare. His last CTA was September 2021. This showed a 4.5 cm aneurysm. He saw Dr. Servando Snare in September 2021. Patient's blood pressure is controlled. His lipids are followed by his primary care provider, his medication list shows that he is on atorvastatin but I am not sure with the dose is.  He is in the office today for preop clearance. He needs a right knee replacement. Since we saw the patient last he is not had chest pain or unusual shortness of breath. He is actually lost about 50 pounds since April 2021. He said he did this with diet and exercise, mainly bike riding and weightlifting.  Past Medical History:  Diagnosis Date  . Acute meniscal tear of knee left  . Aneurysm, ascending aorta (HCC) 4.8cm per ct 2011/ CARDIOLOGSIT--  DR Rollene Fare  LOV  10-07-2011  NOTE W/ CHART AND REQUEST LEXISCAN MYOVIEW AND ECHO TO BE FAXED.   ASYMPTOMATIC  . Heart murmur MILD-- ASYMPTOMATIC  . Hyperlipidemia   . Hypertension   . OA (osteoarthritis) of knee left  . OSA (obstructive sleep apnea) NON-TOLERANT CPAP   no cpap  . Pneumonia   . Pre-diabetes   . Sleep apnea   . Tinnitus of both ears     Past Surgical History:  Procedure Laterality Date  . [place]  . DOPPLER ECHOCARDIOGRAPHY  10/18/2011   EF >55 %  . LUMBAR LAMINECTOMY/DECOMPRESSION MICRODISCECTOMY N/A 02/24/2016   Procedure: Bilateral Decompression L4-5, Discectomy L4-5 ;  Surgeon: Susa Day,  MD;  Location: [facility];  Service: Orthopedics;  Laterality: N/A;  . NM MYOCAR PERF WALL MOTION  10/13/2011   EF 49 %,low risk study  . TONSILLECTOMY  1965  . torn meniscus left knee surgery     . TOTAL HIP ARTHROPLASTY  06-18-2009   OA LEFT HIP  . TOTAL HIP ARTHROPLASTY Right 03/11/2014   Procedure: RIGHT TOTAL HIP ARTHROPLASTY ANTERIOR APPROACH;  Surgeon: [name], MD;  Location: [facility];  Service: Orthopedics;  Laterality: Right;  . TRANSTHORACIC ECHOCARDIOGRAM  09/11/2012   EF 55 to 60 %,mild aortic valve regurg,mild mitral valve regurg,lf and rt atriums mildly dilated  . VARICOSE VEIN SURGERY Left 1980  . veins removed from left leg  1994    Current Medications: Current Meds  Medication Sig  . aspirin EC 81 MG tablet Take 1 tablet (81 mg total) by mouth daily. Resume 4 days post-op  . ATORVASTATIN CALCIUM PO Take 1 tablet by mouth daily.  . Dermatological Products, Misc. (MOISTURE) CREA Apply 1-2 application topically 2 (two) times daily as needed (for dry skin). [employer] Emergency planning/management officer  . furosemide (LASIX) 20 MG tablet Take 40 mg by mouth daily.  Marland Kitchen lisinopril (PRINIVIL,ZESTRIL) 40 MG tablet TAKE 1 TABLET BY MOUTH EVERY DAY FOR BLOOD PRESSURE  . meclizine (ANTIVERT) 25 MG tablet Take 25 mg by mouth daily.  . traMADol (ULTRAM) 50 MG tablet  Take 1 tablet by mouth as directed.     Allergies:   Penicillins and Quinolones   Social History   Socioeconomic History  . Marital status: Married    Spouse name: Not on file  . Number of children: Not on file  . Years of education: Not on file  . Highest education level: Not on file  Occupational History  . Not on file  Tobacco Use  . Smoking status: Never Smoker  . Smokeless tobacco: Former Network engineer and Sexual Activity  . Alcohol use: Yes    Alcohol/week: 1.0 standard drink    Types: 1 Standard drinks or equivalent per week    Comment: RARE  . Drug use: No  . Sexual activity: Yes  Other Topics Concern  . Not on file   Social History Narrative  . Not on file   Social Determinants of Health   Financial Resource Strain: Not on file  Food Insecurity: Not on file  Transportation Needs: Not on file  Physical Activity: Not on file  Stress: Not on file  Social Connections: Not on file     Family History: The patient's family history includes COPD in an other family member; Cancer in his mother and another family member; Diabetes in his mother and another family member; Heart failure in an other family member. There is no history of Colon cancer or Stomach cancer.  ROS:   Please see the history of present illness.     All other systems reviewed and are negative.  EKGs/Labs/Other Studies Reviewed:    The following studies were reviewed today: CTA- 10/03/2019- IMPRESSION: Ascending thoracic aorta measures 4.5 cm maximally on today's study.  Echo 01/06/2020- IMPRESSIONS    1. Left ventricular ejection fraction, by estimation, is 60 to 65%. The  left ventricle has normal function. The left ventricle has no regional  wall motion abnormalities. There is mild left ventricular hypertrophy.  Left ventricular diastolic parameters  were normal.  2. Right ventricular systolic function is normal. The right ventricular  size is normal.  3. The mitral valve is normal in structure. Trivial mitral valve  regurgitation.  4. The aortic valve was not well visualized. Aortic valve regurgitation  is mild to moderate. Mild aortic valve stenosis. Vmax 2.3 m/s, MG 11 mmHg,  AVA 1.6 cm^2, DI 0.53  5. Aortic dilatation noted. Aneurysm of the ascending aorta, measuring 49  mm.   Myoview 2/19/20216- Overall Impression:  Low risk stress nuclear study with a small, mild, fixed inferior defect consistent with inferior thinning; no ischemia; note significant LVE.   EKG:  EKG is ordered today.  The ekg ordered today demonstrates NSR, HR 67  Recent Labs: No results found for requested labs within last 8760 hours.   Recent Lipid Panel No results found for: CHOL, TRIG, HDL, CHOLHDL, VLDL, LDLCALC, LDLDIRECT  Physical Exam:    VS:  BP 130/72 (BP Location: Left Arm, Patient Position: Sitting)   Pulse 67   Ht 6\' 4"  (1.93 m)   Wt 294 lb 9.6 oz (133.6 kg)   SpO2 97%   BMI 35.86 kg/m     Wt Readings from Last 3 Encounters:  01/07/20 294 lb 9.6 oz (133.6 kg)  10/03/19 277 lb (125.6 kg)  03/14/19 (!) 316 lb (143.3 kg)     GEN:  Well nourished, well developed in no acute distress HEENT: Normal NECK: No JVD; No carotid bruits CARDIAC: RRR, no murmurs, rubs, gallops RESPIRATORY:  Clear to auscultation without rales, wheezing  or rhonchi  ABDOMEN: Soft, non-tender, non-distended MUSCULOSKELETAL:  No edema; No deformity  SKIN: Warm and dry NEUROLOGIC:  Alert and oriented x 3 PSYCHIATRIC:  Normal affect   ASSESSMENT:    Preoperative clearance Pt is an acceptable risk for the proposed procedure without further cardiac work-up. Official clearance will be faxed to Dr. Thomasena Edis via epic.  Thoracic aortic aneurysm [facility]) Ascending  thoracic aortic aneurysm 4.9 cm, followed by Dr Tyrone Sage  Aortic regurgitation Mild to moderate on echo Dec 2021  Hyperlipidemia Followed by PCP- I have requested recent lipid panel  Essential hypertension Controlled  PLAN:    Same cardiac Rx- f/u Dr Allyson Sabal in 6 months.    Medication Adjustments/Labs and Tests Ordered: Current medicines are reviewed at length with the patient today.  Concerns regarding medicines are outlined above.  Orders Placed This Encounter  Procedures  . EKG 12-Lead   No orders of the defined types were placed in this encounter.   Patient Instructions  Medication Instructions:  Continue current  Medications  *If you need a refill on your cardiac medications before your next appointment, please call your pharmacy*   Lab Work: None Ordered   Testing/Procedures: None Ordered   Follow-Up: At [facility], you and your health  needs are our priority.  As part of our continuing mission to provide you with exceptional heart care, we have created designated Provider Care Teams.  These Care Teams include your primary Cardiologist (physician) and Advanced Practice Providers (APPs -  Physician Assistants and Nurse Practitioners) who all work together to provide you with the care you need, when you need it.  We recommend signing up for the patient portal called "MyChart".  Sign up information is provided on this After Visit Summary.  MyChart is used to connect with patients for Virtual Visits (Telemedicine).  Patients are able to view lab/test results, encounter notes, upcoming appointments, etc.  Non-urgent messages can be sent to your provider as well.   To learn more about what you can do with MyChart, go to [email or web address].    Your next appointment:   6 month(s)  The format for your next appointment:   In Person  Provider:   You may see Nanetta Batty, MD  or one of the following Advanced Practice Providers on your designated Care Team:    Corine Shelter, PA-C  [place], [place]  Edd Fabian, FNP         Signed, Corine Shelter, [place]  01/07/2020 9:46 AM    [facility]

## 2020-01-07 NOTE — Patient Instructions (Signed)
Medication Instructions:  Continue current  Medications  *If you need a refill on your cardiac medications before your next appointment, please call your pharmacy*   Lab Work: None Ordered   Testing/Procedures: None Ordered   Follow-Up: At [facility], you and your health needs are our priority.  As part of our continuing mission to provide you with exceptional heart care, we have created designated Provider Care Teams.  These Care Teams include your primary Cardiologist (physician) and Advanced Practice Providers (APPs -  Physician Assistants and Nurse Practitioners) who all work together to provide you with the care you need, when you need it.  We recommend signing up for the patient portal called "MyChart".  Sign up information is provided on this After Visit Summary.  MyChart is used to connect with patients for Virtual Visits (Telemedicine).  Patients are able to view lab/test results, encounter notes, upcoming appointments, etc.  Non-urgent messages can be sent to your provider as well.   To learn more about what you can do with MyChart, go to [email or web address].    Your next appointment:   6 month(s)  The format for your next appointment:   In Person  Provider:   You may see Nanetta Batty, MD  or one of the following Advanced Practice Providers on your designated Care Team:    Corine Shelter, PA-C  [place], [place]  Edd Fabian, [place]

## 2020-01-07 NOTE — Assessment & Plan Note (Signed)
Ascending  thoracic aortic aneurysm 4.9 cm, followed by Dr Tyrone Sage

## 2020-01-07 NOTE — Assessment & Plan Note (Signed)
Mild to moderate on echo Dec 2021

## 2020-01-07 NOTE — Assessment & Plan Note (Signed)
Followed by PCP- I have requested recent lipid panel

## 2020-01-07 NOTE — Assessment & Plan Note (Signed)
Controlled.  

## 2020-01-07 NOTE — Telephone Encounter (Signed)
   Primary Cardiologist: Dr Allyson Sabal  Chart reviewed and patient seen in the office today as part of pre-operative protocol coverage. Given past medical history and time since last visit, based on ACC/AHA guidelines, DEMARYIUS IMRAN II would be at acceptable risk for the planned procedure without further cardiovascular testing.  OK to hold aspirin if needed peri op.    The patient was advised that if he develops new symptoms prior to surgery to contact our office to arrange for a follow-up visit, and he verbalized understanding.  I will route this recommendation to the requesting party via Epic fax function and remove from pre-op pool.  Please call with questions.  Corine Shelter, PA-C 01/07/2020, 9:49 AM

## 2020-01-11 HISTORY — PX: SHOULDER ARTHROSCOPY: SHX128

## 2020-01-27 ENCOUNTER — Other Ambulatory Visit (HOSPITAL_COMMUNITY): Payer: BC Managed Care – PPO

## 2020-01-27 ENCOUNTER — Encounter (HOSPITAL_COMMUNITY): Admission: RE | Admit: 2020-01-27 | Payer: BC Managed Care – PPO | Source: Ambulatory Visit

## 2020-02-14 ENCOUNTER — Encounter (HOSPITAL_COMMUNITY): Payer: Self-pay

## 2020-02-14 ENCOUNTER — Other Ambulatory Visit (HOSPITAL_COMMUNITY): Payer: Self-pay

## 2020-02-14 ENCOUNTER — Other Ambulatory Visit: Payer: Self-pay

## 2020-02-14 ENCOUNTER — Encounter (HOSPITAL_COMMUNITY)
Admission: RE | Admit: 2020-02-14 | Discharge: 2020-02-14 | Disposition: A | Discharge status: Home / Self Care | Payer: Self-pay | Source: Ambulatory Visit | Attending: Specialist | Admitting: Specialist

## 2020-02-14 DIAGNOSIS — I10 Essential (primary) hypertension: Secondary | ICD-10-CM | POA: Insufficient documentation

## 2020-02-14 DIAGNOSIS — I714 Abdominal aortic aneurysm, without rupture: Secondary | ICD-10-CM | POA: Insufficient documentation

## 2020-02-14 DIAGNOSIS — M1711 Unilateral primary osteoarthritis, right knee: Secondary | ICD-10-CM | POA: Insufficient documentation

## 2020-02-14 DIAGNOSIS — G4733 Obstructive sleep apnea (adult) (pediatric): Secondary | ICD-10-CM | POA: Insufficient documentation

## 2020-02-14 DIAGNOSIS — Z79891 Long term (current) use of opiate analgesic: Secondary | ICD-10-CM | POA: Insufficient documentation

## 2020-02-14 DIAGNOSIS — Z79899 Other long term (current) drug therapy: Secondary | ICD-10-CM | POA: Insufficient documentation

## 2020-02-14 DIAGNOSIS — Z7982 Long term (current) use of aspirin: Secondary | ICD-10-CM | POA: Insufficient documentation

## 2020-02-14 DIAGNOSIS — Z01812 Encounter for preprocedural laboratory examination: Secondary | ICD-10-CM | POA: Insufficient documentation

## 2020-02-14 LAB — BASIC METABOLIC PANEL
Anion gap: 10 (ref 5–15)
BUN: 23 mg/dL (ref 8–23)
CO2: 27 mmol/L (ref 22–32)
Calcium: 9.2 mg/dL (ref 8.9–10.3)
Chloride: 101 mmol/L (ref 98–111)
Creatinine, Ser: 1.06 mg/dL (ref 0.61–1.24)
GFR, Estimated: >60 mL/min (ref >60)
Glucose, Bld: 112 mg/dL — ABNORMAL HIGH (ref 70–99)
Potassium: 4.1 mmol/L (ref 3.5–5.1)
Sodium: 138 mmol/L (ref 135–145)

## 2020-02-14 LAB — SURGICAL PCR SCREEN
MRSA, PCR: NEGATIVE
Staphylococcus aureus: NEGATIVE

## 2020-02-14 LAB — CBC
HCT: 43.8 % (ref 39.0–52.0)
Hemoglobin: 14.6 g/dL (ref 13.0–17.0)
MCH: 31.8 pg (ref 26.0–34.0)
MCHC: 33.3 g/dL (ref 30.0–36.0)
MCV: 95.4 fL (ref 80.0–100.0)
Platelets: 185 10*3/uL (ref 150–400)
RBC: 4.59 MIL/uL (ref 4.22–5.81)
RDW: 12.5 % (ref 11.5–15.5)
WBC: 6.8 10*3/uL (ref 4.0–10.5)
nRBC: 0 % (ref 0.0–0.2)

## 2020-02-14 LAB — HEMOGLOBIN A1C
Hgb A1c MFr Bld: 5.3 % (ref 4.8–5.6)
Mean Plasma Glucose: 105.41 mg/dL

## 2020-02-14 NOTE — H&P (View-Only) (Signed)
DUE TO COVID-19 ONLY ONE VISITOR IS ALLOWED TO COME WITH YOU AND STAY IN THE WAITING ROOM ONLY DURING PRE OP AND PROCEDURE DAY OF SURGERY. THE 1 VISITOR  MAY VISIT WITH YOU AFTER SURGERY IN YOUR PRIVATE ROOM DURING VISITING HOURS ONLY!  YOU NEED TO HAVE A COVID 19 TEST ON_2/12/2020 ______ @___1115AM  ____, THIS TEST MUST BE DONE BEFORE SURGERY,  COVID TESTING SITE [address] [place] [address], IT IS ON THE RIGHT GOING OUT [address] APPROXIMATELY  2 MINUTES PAST [employer] ON THE RIGHT. ONCE YOUR COVID TEST IS COMPLETED,  PLEASE BEGIN THE QUARANTINE INSTRUCTIONS AS OUTLINED IN YOUR HANDOUT.                Travis Myers  02/14/2020   Your procedure is scheduled on: 02/26/2020    Report to [facility] Main  Entrance   Report to admitting at      0600 AM     Call this number if you have problems the morning of surgery [phone]    Remember: Do not eat food , candy gum or mints :After Midnight. You may have clear liquids from midnight until 0530AM     CLEAR LIQUID DIET   Foods Allowed                                                                       Coffee and tea, regular and decaf                              Plain Jell-O any favor except red or purple                                            Fruit ices (not with fruit pulp)                                      Iced Popsicles                                     Carbonated beverages, regular and diet                                    Cranberry, grape and apple juices Sports drinks like Gatorade Lightly seasoned clear broth or consume(fat free) Sugar, honey syrup   _____________________________________________________________________    BRUSH YOUR TEETH MORNING OF SURGERY AND RINSE YOUR MOUTH OUT, NO CHEWING GUM CANDY OR MINTS.     Take these medicines the morning of surgery with A SIP OF WATER: AM LODIPINE   DO NOT TAKE ANY DIABETIC MEDICATIONS DAY OF YOUR SURGERY                                You may not  have any metal on your body including hair pins and              piercings  Do not wear jewelry, make-up, lotions, powders or perfumes, deodorant             Do not wear nail polish on your fingernails.  Do not shave  48 hours prior to surgery.              Men may shave face and neck.   Do not bring valuables to the hospital. [facility] IS NOT             RESPONSIBLE   FOR VALUABLES.  Contacts, dentures or bridgework may not be worn into surgery.  Leave suitcase in the car. After surgery it may be brought to your room.     Patients discharged the day of surgery will not be allowed to drive home. IF YOU ARE HAVING SURGERY AND GOING HOME THE SAME DAY, YOU MUST HAVE AN ADULT TO DRIVE YOU HOME AND BE WITH YOU FOR 24 HOURS. YOU MAY GO HOME BY TAXI OR UBER OR ORTHERWISE, BUT AN ADULT MUST ACCOMPANY YOU HOME AND STAY WITH YOU FOR 24 HOURS.  Name and phone number of your driver:  Special Instructions: N/A              Please read over the following fact sheets you were given: _____________________________________________________________________  [facility] - Preparing for Surgery Before surgery, you can play an important role.  Because skin is not sterile, your skin needs to be as free of germs as possible.  You can reduce the number of germs on your skin by washing with CHG (chlorahexidine gluconate) soap before surgery.  CHG is an antiseptic cleaner which kills germs and bonds with the skin to continue killing germs even after washing. Please DO NOT use if you have an allergy to CHG or antibacterial soaps.  If your skin becomes reddened/irritated stop using the CHG and inform your nurse when you arrive at Short Stay. Do not shave (including legs and underarms) for at least 48 hours prior to the first CHG shower.  You may shave your face/neck. Please follow these instructions carefully:  1.  Shower with CHG Soap the night before surgery and the  morning of  Surgery.  2.  If you choose to wash your hair, wash your hair first as usual with your  normal  shampoo.  3.  After you shampoo, rinse your hair and body thoroughly to remove the  shampoo.                           4.  Use CHG as you would any other liquid soap.  You can apply chg directly  to the skin and wash                       Gently with a scrungie or clean washcloth.  5.  Apply the CHG Soap to your body ONLY FROM THE NECK DOWN.   Do not use on face/ open                           Wound or open sores. Avoid contact with eyes, ears mouth and genitals (private parts).                         Wash face,  Genitals (private parts) with your normal soap.             6.  Wash thoroughly, paying special attention to the area where your surgery  will be performed.  7.  Thoroughly rinse your body with warm water from the neck down.  8.  DO NOT shower/wash with your normal soap after using and rinsing off  the CHG Soap.                9.  Pat yourself dry with a clean towel.            10.  Wear clean pajamas.            11.  Place clean sheets on your bed the night of your first shower and do not  sleep with pets. Day of Surgery : Do not apply any lotions/deodorants the morning of surgery.  Please wear clean clothes to the hospital/surgery center.  FAILURE TO FOLLOW THESE INSTRUCTIONS MAY RESULT IN THE CANCELLATION OF YOUR SURGERY PATIENT SIGNATURE_________________________________  NURSE SIGNATURE__________________________________  ________________________________________________________________________

## 2020-02-14 NOTE — Progress Notes (Signed)
DUE TO COVID-19 ONLY ONE VISITOR IS ALLOWED TO COME WITH YOU AND STAY IN THE WAITING ROOM ONLY DURING PRE OP AND PROCEDURE DAY OF SURGERY. THE 1 VISITOR  MAY VISIT WITH YOU AFTER SURGERY IN YOUR PRIVATE ROOM DURING VISITING HOURS ONLY!  YOU NEED TO HAVE A COVID 19 TEST ON_2/12/2020 ______ @___1115AM  ____, THIS TEST MUST BE DONE BEFORE SURGERY,  COVID TESTING SITE [address] [place] [address], IT IS ON THE RIGHT GOING OUT [address] APPROXIMATELY  2 MINUTES PAST [employer] ON THE RIGHT. ONCE YOUR COVID TEST IS COMPLETED,  PLEASE BEGIN THE QUARANTINE INSTRUCTIONS AS OUTLINED IN YOUR HANDOUT.                Travis Myers  02/14/2020   Your procedure is scheduled on: 02/26/2020    Report to [facility] Main  Entrance   Report to admitting at      0600 AM     Call this number if you have problems the morning of surgery [phone]    Remember: Do not eat food , candy gum or mints :After Midnight. You may have clear liquids from midnight until 0530AM     CLEAR LIQUID DIET   Foods Allowed                                                                       Coffee and tea, regular and decaf                              Plain Jell-O any favor except red or purple                                            Fruit ices (not with fruit pulp)                                      Iced Popsicles                                     Carbonated beverages, regular and diet                                    Cranberry, grape and apple juices Sports drinks like Gatorade Lightly seasoned clear broth or consume(fat free) Sugar, honey syrup   _____________________________________________________________________    BRUSH YOUR TEETH MORNING OF SURGERY AND RINSE YOUR MOUTH OUT, NO CHEWING GUM CANDY OR MINTS.     Take these medicines the morning of surgery with A SIP OF WATER: AM LODIPINE   DO NOT TAKE ANY DIABETIC MEDICATIONS DAY OF YOUR SURGERY                                You may not  have any metal on your body including hair pins and              piercings  Do not wear jewelry, make-up, lotions, powders or perfumes, deodorant             Do not wear nail polish on your fingernails.  Do not shave  48 hours prior to surgery.              Men may shave face and neck.   Do not bring valuables to the hospital. [place].  Contacts, dentures or bridgework may not be worn into surgery.  Leave suitcase in the car. After surgery it may be brought to your room.     Patients discharged the day of surgery will not be allowed to drive home. IF YOU ARE HAVING SURGERY AND GOING HOME THE SAME DAY, YOU MUST HAVE AN ADULT TO DRIVE YOU HOME AND BE WITH YOU FOR 24 HOURS. YOU MAY GO HOME BY TAXI OR UBER OR ORTHERWISE, BUT AN ADULT MUST ACCOMPANY YOU HOME AND STAY WITH YOU FOR 24 HOURS.  Name and phone number of your driver:  Special Instructions: N/A              Please read over the following fact sheets you were given: _____________________________________________________________________  [facility] - Preparing for Surgery Before surgery, you can play an important role.  Because skin is not sterile, your skin needs to be as free of germs as possible.  You can reduce the number of germs on your skin by washing with CHG (chlorahexidine gluconate) soap before surgery.  CHG is an antiseptic cleaner which kills germs and bonds with the skin to continue killing germs even after washing. Please DO NOT use if you have an allergy to CHG or antibacterial soaps.  If your skin becomes reddened/irritated stop using the CHG and inform your nurse when you arrive at Short Stay. Do not shave (including legs and underarms) for at least 48 hours prior to the first CHG shower.  You may shave your face/neck. Please follow these instructions carefully:  1.  Shower with CHG Soap the night before surgery and the  morning of  Surgery.  2.  If you choose to wash your hair, wash your hair first as usual with your  normal  shampoo.  3.  After you shampoo, rinse your hair and body thoroughly to remove the  shampoo.                           4.  Use CHG as you would any other liquid soap.  You can apply chg directly  to the skin and wash                       Gently with a scrungie or clean washcloth.  5.  Apply the CHG Soap to your body ONLY FROM THE NECK DOWN.   Do not use on face/ open                           Wound or open sores. Avoid contact with eyes, ears mouth and genitals (private parts).  Wash face,  Genitals (private parts) with your normal soap.             6.  Wash thoroughly, paying special attention to the area where your surgery  will be performed.  7.  Thoroughly rinse your body with warm water from the neck down.  8.  DO NOT shower/wash with your normal soap after using and rinsing off  the CHG Soap.                9.  Pat yourself dry with a clean towel.            10.  Wear clean pajamas.            11.  Place clean sheets on your bed the night of your first shower and do not  sleep with pets. Day of Surgery : Do not apply any lotions/deodorants the morning of surgery.  Please wear clean clothes to the hospital/surgery center.  FAILURE TO FOLLOW THESE INSTRUCTIONS MAY RESULT IN THE CANCELLATION OF YOUR SURGERY PATIENT SIGNATURE_________________________________  NURSE SIGNATURE__________________________________  ________________________________________________________________________

## 2020-02-14 NOTE — Progress Notes (Addendum)
MS                Anesthesia Review:  PCP: Acquanetta Sit  Cardiologist : Jana Half  01/07/20 LOV - clearance  DR Servando Snare- follows AAA- LOV 10/03/19  Chest x-ray :10/03/19 Ct angio chest  EKG : 01/07/2020  Echo : Stress test: Cardiac Cath :  Activity level:  Sleep Study/ CPAP : never completed a sleep study per pt  Fasting Blood Sugar :      / Checks Blood Sugar -- times a day:   Blood Thinner/ Instructions /Last Dose: ASA / Instructions/ Last Dose :  81 mg aspirin- seeing Dr Theda Sers on 02/14/20 after preop  Prediabetes hgba1c-02/14/20- 5.3

## 2020-02-17 NOTE — Progress Notes (Signed)
Anesthesia Chart Review   Case: [id number] Date/Time: 02/26/20 0815   Procedure: TOTAL KNEE ARTHROPLASTY (Right Knee) - adductor canal   Anesthesia type: Spinal   Pre-op diagnosis: Right knee osteoarthritis   Location: [facility] ROOM 04 / [facility]   Surgeons: Sydnee Cabal, MD      DISCUSSION:65 y.o. never smoker with h/o HTN, OSA, AAA, sleep apnea, right knee OA scheduled for above procedure 02/26/2020 with Dr. Sydnee Cabal.   Pt last seen by cardiology 01/07/2020. Per OV note, "Pt is an acceptable risk for the proposed procedure without further cardiac work-up."  AAA followed by vascular surgeon.  Last seen 10/03/2019. Per OV note stable 4.8cm ascending thoracic aortic dilatation with no change from previous exams. CTA planned in 6 months.   Anticipate pt can proceed with planned procedure barring acute status change.   VS: BP 133/74   Pulse 69   Temp 37.2 C   Resp 16   SpO2 97%   PROVIDERS: Cyndi Bender, PA-C  Is PCP   Quay Burow, MD is Cardiologist  LABS: Labs reviewed: Acceptable for surgery. (all labs ordered are listed, but only abnormal results are displayed)  Labs Reviewed  BASIC METABOLIC PANEL - Abnormal; Notable for the following components:      Result Value   Glucose, Bld 112 (*)    All other components within normal limits  SURGICAL PCR SCREEN  CBC  HEMOGLOBIN A1C     IMAGES: US Carotid 04/11/2019 IMPRESSION: 1. Bilateral carotid bifurcation plaque resulting in less than 50% diameter ICA stenosis. 2. Antegrade bilateral vertebral arterial flow.  EKG: 01/07/2020 Rate 67 bpm  NSR  CV: Echo 01/06/20 IMPRESSIONS    1. Left ventricular ejection fraction, by estimation, is 60 to 65%. The  left ventricle has normal function. The left ventricle has no regional  wall motion abnormalities. There is mild left ventricular hypertrophy.  Left ventricular diastolic parameters  were normal.  2. Right ventricular systolic function is normal. The right  ventricular  size is normal.  3. The mitral valve is normal in structure. Trivial mitral valve  regurgitation.  4. The aortic valve was not well visualized. Aortic valve regurgitation  is mild to moderate. Mild aortic valve stenosis. Vmax 2.3 m/s, MG 11 mmHg,  AVA 1.6 cm^2, DI 0.53  5. Aortic dilatation noted. Aneurysm of the ascending aorta, measuring 49  mm.   Stress Test 02/28/2014 Overall Impression:  Low risk stress nuclear study with a small, mild, fixed inferior defect consistent with inferior thinning; no ischemia; note significant LVE.  Past Medical History:  Diagnosis Date  . Acute meniscal tear of knee left  . Aneurysm, ascending aorta (HCC) 4.8cm per ct 2011/ CARDIOLOGSIT--  DR Rollene Fare  LOV  10-07-2011  NOTE W/ CHART AND REQUEST LEXISCAN MYOVIEW AND ECHO TO BE FAXED.   ASYMPTOMATIC  . Heart murmur MILD-- ASYMPTOMATIC  . Hyperlipidemia   . Hypertension   . OA (osteoarthritis) of knee left  . OSA (obstructive sleep apnea) NON-TOLERANT CPAP   no cpap PT REPORTS NEVER FINISHED TEST   . Pre-diabetes   . Sleep apnea   . Tinnitus of both ears     Past Surgical History:  Procedure Laterality Date  . [place]  . DOPPLER ECHOCARDIOGRAPHY  10/18/2011   EF >55 %  . LUMBAR LAMINECTOMY/DECOMPRESSION MICRODISCECTOMY N/A 02/24/2016   Procedure: Bilateral Decompression L4-5, Discectomy L4-5 ;  Surgeon: Susa Day, MD;  Location: [facility];  Service: Orthopedics;  Laterality: N/A;  .  NM MYOCAR PERF WALL MOTION  10/13/2011   EF 49 %,low risk study  . RIGHT KNEE SURGERY X 2      ARTHROSCOPY   . TONSILLECTOMY  1965  . torn meniscus left knee surgery     . TORN MENISCUS X 3 IN KNEES SURGERY      LEFT   . TOTAL HIP ARTHROPLASTY  06-18-2009   OA LEFT HIP  . TOTAL HIP ARTHROPLASTY Right 03/11/2014   Procedure: RIGHT TOTAL HIP ARTHROPLASTY ANTERIOR APPROACH;  Surgeon: [name], MD;  Location: [facility];  Service: Orthopedics;  Laterality: Right;  .  TRANSTHORACIC ECHOCARDIOGRAM  09/11/2012   EF 55 to 60 %,mild aortic valve regurg,mild mitral valve regurg,lf and rt atriums mildly dilated  . VARICOSE VEIN SURGERY Left 1980  . veins removed from left leg  1994    MEDICATIONS: . acetaminophen (TYLENOL) 500 MG tablet  . amLODipine (NORVASC) 10 MG tablet  . aspirin EC 81 MG tablet  . atorvastatin (LIPITOR) 10 MG tablet  . furosemide (LASIX) 20 MG tablet  . lisinopril (PRINIVIL,ZESTRIL) 40 MG tablet  . meclizine (ANTIVERT) 25 MG tablet  . traMADol (ULTRAM) 50 MG tablet  . triamcinolone (KENALOG) 0.1 %   No current facility-administered medications for this encounter.    Konrad Felix, PA-C [facility]-Surgical Testing [phone]

## 2020-02-17 NOTE — Anesthesia Preprocedure Evaluation (Addendum)
Anesthesia Evaluation  Patient identified by MRN, date of birth, ID band Patient awake    Reviewed: Allergy & Precautions, NPO status , Patient's Chart, lab work & pertinent test results  Airway Mallampati: II  TM Distance: >3 FB Neck ROM: Full    Dental no notable dental hx.    Pulmonary sleep apnea ,    Pulmonary exam normal breath sounds clear to auscultation       Cardiovascular Exercise Tolerance: Good hypertension, Normal cardiovascular exam Rhythm:Regular Rate:Normal + Systolic murmurs Ascending aortic aneursym  1. Left ventricular ejection fraction, by estimation, is 60 to 65%. The  left ventricle has normal function. The left ventricle has no regional  wall motion abnormalities. There is mild left ventricular hypertrophy.  Left ventricular diastolic parameters  were normal.  2. Right ventricular systolic function is normal. The right ventricular  size is normal.  3. The mitral valve is normal in structure. Trivial mitral valve  regurgitation.  4. The aortic valve was not well visualized. Aortic valve regurgitation  is mild to moderate. Mild aortic valve stenosis. Vmax 2.3 m/s, MG 11 mmHg,  AVA 1.6 cm^2, DI 0.53  5. Aortic dilatation noted. Aneurysm of the ascending aorta, measuring 49  mm.    Neuro/Psych negative neurological ROS  negative psych ROS   GI/Hepatic negative GI ROS, Neg liver ROS,   Endo/Other  Diabetes: prediabetes.hyperlipidemia  Renal/GU negative Renal ROS  negative genitourinary   Musculoskeletal  (+) Arthritis ,   Abdominal Normal abdominal exam  (+)   Peds negative pediatric ROS (+)  Hematology negative hematology ROS (+)   Anesthesia Other Findings   Reproductive/Obstetrics                           Anesthesia Physical Anesthesia Plan  ASA: III  Anesthesia Plan: General and Regional   Post-op Pain Management: GA combined w/ Regional for post-op  pain   Induction:   PONV Risk Score and Plan:   Airway Management Planned: LMA  Additional Equipment: None  Intra-op Plan:   Post-operative Plan: Extubation in OR  Informed Consent: I have reviewed the patients History and Physical, chart, labs and discussed the procedure including the risks, benefits and alternatives for the proposed anesthesia with the patient or authorized representative who has indicated his/her understanding and acceptance.     Dental advisory given  Plan Discussed with: CRNA and Anesthesiologist  Anesthesia Plan Comments: (Plan for LMA given ECHO with mild aortic stenosis and h/o of back surgery. Adductor canal block for postop pain control)      Anesthesia Quick Evaluation

## 2020-02-18 ENCOUNTER — Other Ambulatory Visit: Payer: Self-pay | Admitting: Cardiothoracic Surgery

## 2020-02-18 DIAGNOSIS — I712 Thoracic aortic aneurysm, without rupture, unspecified: Secondary | ICD-10-CM

## 2020-02-22 ENCOUNTER — Other Ambulatory Visit (HOSPITAL_COMMUNITY)
Admission: RE | Admit: 2020-02-22 | Discharge: 2020-02-22 | Disposition: A | Discharge status: Home / Self Care | Payer: BC Managed Care – PPO | Source: Ambulatory Visit | Attending: Specialist | Admitting: Specialist

## 2020-02-22 DIAGNOSIS — Z01812 Encounter for preprocedural laboratory examination: Secondary | ICD-10-CM | POA: Insufficient documentation

## 2020-02-22 DIAGNOSIS — Z20822 Contact with and (suspected) exposure to covid-19: Secondary | ICD-10-CM | POA: Insufficient documentation

## 2020-02-22 LAB — SARS CORONAVIRUS 2 (TAT 6-24 HRS): SARS Coronavirus 2: NEGATIVE

## 2020-02-24 NOTE — H&P (Signed)
TOTAL KNEE ADMISSION H&P  Patient is being admitted for right total knee arthroplasty.  Subjective:  Chief Complaint:right knee pain.  HPI: Travis Myers, 65 y.o. male, has a history of pain and functional disability in the right knee due to arthritis and has failed non-surgical conservative treatments for greater than 12 weeks to includeNSAID's and/or analgesics, corticosteriod injections, viscosupplementation injections, flexibility and strengthening excercises and weight reduction as appropriate.  Onset of symptoms was gradual, starting 5 years ago with gradually worsening course since that time. The patient noted no past surgery on the right knee(s).  Patient currently rates pain in the right knee(s) at 5 out of 10 with activity. Patient has night pain, worsening of pain with activity and weight bearing, pain that interferes with activities of daily living, pain with passive range of motion, crepitus and joint swelling.  Patient has evidence of periarticular osteophytes and joint space narrowing by imaging studies. This patient has had no previous injury. There is no active infection.  Patient Active Problem List   Diagnosis Date Noted  . Aortic regurgitation 07/26/2018  . Spinal stenosis of lumbar region 02/24/2016  . HNP (herniated nucleus pulposus), lumbar 02/24/2016  . Obese 03/13/2014  . S/P right THA, AA 03/11/2014  . Preoperative clearance 02/28/2014  . Thoracic aortic aneurysm ([place]) 03/14/2013  . Essential hypertension 03/14/2013  . Hyperlipidemia 03/14/2013   Past Medical History:  Diagnosis Date  . Acute meniscal tear of knee left  . Aneurysm, ascending aorta ([facility]) 4.8cm per ct 2011/ CARDIOLOGSIT--  DR Rollene Fare  LOV  10-07-2011  NOTE W/ CHART AND REQUEST LEXISCAN MYOVIEW AND ECHO TO BE FAXED.   ASYMPTOMATIC  . Heart murmur MILD-- ASYMPTOMATIC  . Hyperlipidemia   . Hypertension   . OA (osteoarthritis) of knee left  . OSA (obstructive sleep apnea) NON-TOLERANT  CPAP   no cpap PT REPORTS NEVER FINISHED TEST   . Pre-diabetes   . Sleep apnea   . Tinnitus of both ears     Past Surgical History:  Procedure Laterality Date  . [place]  . DOPPLER ECHOCARDIOGRAPHY  10/18/2011   EF >55 %  . LUMBAR LAMINECTOMY/DECOMPRESSION MICRODISCECTOMY N/A 02/24/2016   Procedure: Bilateral Decompression L4-5, Discectomy L4-5 ;  Surgeon: Susa Day, MD;  Location: [facility];  Service: Orthopedics;  Laterality: N/A;  . NM MYOCAR PERF WALL MOTION  10/13/2011   EF 49 %,low risk study  . RIGHT KNEE SURGERY X 2      ARTHROSCOPY   . TONSILLECTOMY  1965  . torn meniscus left knee surgery     . TORN MENISCUS X 3 IN KNEES SURGERY      LEFT   . TOTAL HIP ARTHROPLASTY  06-18-2009   OA LEFT HIP  . TOTAL HIP ARTHROPLASTY Right 03/11/2014   Procedure: RIGHT TOTAL HIP ARTHROPLASTY ANTERIOR APPROACH;  Surgeon: [name], MD;  Location: [facility];  Service: Orthopedics;  Laterality: Right;  . TRANSTHORACIC ECHOCARDIOGRAM  09/11/2012   EF 55 to 60 %,mild aortic valve regurg,mild mitral valve regurg,lf and rt atriums mildly dilated  . VARICOSE VEIN SURGERY Left 1980  . veins removed from left leg  1994    No current facility-administered medications for this encounter.   Current Outpatient Medications  Medication Sig Dispense Refill Last Dose  . acetaminophen (TYLENOL) 500 MG tablet Take 1,000 mg by mouth every 8 (eight) hours as needed for moderate pain.     Marland Kitchen amLODipine (NORVASC) 10 MG tablet Take  10 mg by mouth daily.     . furosemide (LASIX) 20 MG tablet Take 40 mg by mouth daily.  5   . lisinopril (PRINIVIL,ZESTRIL) 40 MG tablet Take 40 mg by mouth daily.  5   . meclizine (ANTIVERT) 25 MG tablet Take 25 mg by mouth daily.  0   . traMADol (ULTRAM) 50 MG tablet Take 1 tablet by mouth every 12 (twelve) hours as needed for severe pain.  2   . triamcinolone (KENALOG) 0.1 % Apply 1 application topically 2 (two) times daily as needed (dry skin).     Marland Kitchen  aspirin EC 81 MG tablet Take 1 tablet (81 mg total) by mouth daily. Resume 4 days post-op     . atorvastatin (LIPITOR) 10 MG tablet Take 1 tablet by mouth daily.      Allergies  Allergen Reactions  . Penicillins Other (See Comments)    Childhood reaction.  Has patient had a PCN reaction causing immediate rash, facial/tongue/throat swelling, SOB or lightheadedness with hypotension:unsure Has patient had a PCN reaction causing severe rash involving mucus membranes or skin necrosis:unsure Has patient had a PCN reaction that required hospitalization:unsure Has patient had a PCN reaction occurring within the last 10 years:No If all of the above answers are "NO", then may proceed with Cephalosporin use.   . Quinolones     Patient was warned about not using Cipro and similar antibiotics. Recent studies have raised concern that fluoroquinolone antibiotics could be associated with an increased risk of aortic aneurysm Fluoroquinolones have non-antimicrobial properties that might jeopardise the integrity of the extracellular matrix of the vascular wall In a  propensity score matched cohort study in [place], there was a 66% increased rate of aortic aneurysm or dissection associated with oral fluoroquinolone use, compared wit    Social History   Tobacco Use  . Smoking status: Never Smoker  . Smokeless tobacco: Former Network engineer Use Topics  . Alcohol use: Yes    Alcohol/week: 1.0 standard drink    Types: 1 Standard drinks or equivalent per week    Comment: RARE    Family History  Problem Relation Age of Onset  . Diabetes Mother   . Cancer Mother   . Heart failure Other   . Diabetes Other   . Cancer Other   . COPD Other   . Colon cancer Neg Hx   . Stomach cancer Neg Hx      Review of Systems  Constitutional: Negative.   HENT: Negative.   Respiratory: Negative.   Cardiovascular: Negative.   Gastrointestinal: Negative.   Endocrine: Negative.   Musculoskeletal: Positive for  arthralgias and joint swelling.  Skin: Negative.   Allergic/Immunologic: Negative.   Neurological: Negative.   Hematological: Negative.   Psychiatric/Behavioral: Negative.   All other systems reviewed and are negative.   Objective:  Physical Exam Constitutional:      Appearance: Normal appearance. He is normal weight.  HENT:     Head: Normocephalic and atraumatic.     Nose: Nose normal.  Eyes:     Extraocular Movements: Extraocular movements intact.     Pupils: Pupils are equal, round, and reactive to light.  Neck:     Vascular: No carotid bruit.  Cardiovascular:     Rate and Rhythm: Normal rate and regular rhythm.     Pulses: Normal pulses.     Heart sounds: Normal heart sounds. No murmur heard. No friction rub. No gallop.   Pulmonary:     Effort: Pulmonary  effort is normal. No respiratory distress.     Breath sounds: Normal breath sounds. No stridor. No wheezing, rhonchi or rales.  Chest:     Chest wall: No tenderness.  Musculoskeletal:     Cervical back: Normal range of motion and neck supple. No tenderness.     Comments: Tenderness over the medial and lateral joint line Full ROM of right knee No calf tenderness NVI in right lower extremity  Skin:    General: Skin is warm and dry.     Capillary Refill: Capillary refill takes less than 2 seconds.  Neurological:     General: No focal deficit present.     Mental Status: He is alert and oriented to person, place, and time.  Psychiatric:        Mood and Affect: Mood normal.        Behavior: Behavior normal.        Thought Content: Thought content normal.        Judgment: Judgment normal.     Vital signs in last 24 hours:    Labs:   Estimated body mass index is 35.86 kg/m as calculated from the following:   Height as of 01/07/20: 6\' 4"  (1.93 m).   Weight as of 01/07/20: 133.6 kg.   Imaging Review Plain radiographs demonstrate moderate degenerative joint disease of the right knee(s). The overall alignment  isneutral. The bone quality appears to be fair for age and reported activity level.      Assessment/Plan:  End stage arthritis, right knee   The patient history, physical examination, clinical judgment of the provider and imaging studies are consistent with end stage degenerative joint disease of the right knee(s) and total knee arthroplasty is deemed medically necessary. The treatment options including medical management, injection therapy arthroscopy and arthroplasty were discussed at length. The risks and benefits of total knee arthroplasty were presented and reviewed. The risks due to aseptic loosening, infection, stiffness, patella tracking problems, thromboembolic complications and other imponderables were discussed. The patient acknowledged the explanation, agreed to proceed with the plan and consent was signed. Patient is being admitted for inpatient treatment for surgery, pain control, PT, OT, prophylactic antibiotics, VTE prophylaxis, progressive ambulation and ADL's and discharge planning. The patient is planning to be discharged home with out patient PT.      Patient's anticipated LOS is less than 2 midnights, meeting these requirements: - Younger than 6 - Lives within 1 hour of care - Has a competent adult at home to recover with post-op recover - NO history of  - Chronic pain requiring opiods  - Diabetes  - Coronary Artery Disease  - Heart failure  - Heart attack  - Stroke  - DVT/VTE  - Cardiac arrhythmia  - Respiratory Failure/COPD  - Renal failure  - Anemia  - Advanced Liver disease

## 2020-02-25 MED ORDER — BUPIVACAINE LIPOSOME 1.3 % IJ SUSP
20.0000 mL | INTRAMUSCULAR | Status: DC
  Filled 2020-02-25: qty 20

## 2020-02-26 ENCOUNTER — Observation Stay (HOSPITAL_COMMUNITY)
Admission: RE | Admit: 2020-02-26 | Discharge: 2020-02-27 | Disposition: A | Discharge status: Home / Self Care | Payer: BC Managed Care – PPO | Source: Ambulatory Visit | Attending: Specialist | Admitting: Specialist

## 2020-02-26 ENCOUNTER — Other Ambulatory Visit: Payer: Self-pay

## 2020-02-26 ENCOUNTER — Ambulatory Visit (HOSPITAL_COMMUNITY): Payer: BC Managed Care – PPO | Admitting: Certified Registered"

## 2020-02-26 ENCOUNTER — Encounter (HOSPITAL_COMMUNITY)
Admission: RE | Disposition: A | Discharge status: Home / Self Care | Payer: Self-pay | Source: Ambulatory Visit | Attending: Specialist

## 2020-02-26 ENCOUNTER — Ambulatory Visit (HOSPITAL_COMMUNITY): Payer: BC Managed Care – PPO | Admitting: Physician Assistant

## 2020-02-26 ENCOUNTER — Encounter (HOSPITAL_COMMUNITY): Payer: Self-pay | Admitting: Specialist

## 2020-02-26 DIAGNOSIS — Z791 Long term (current) use of non-steroidal anti-inflammatories (NSAID): Secondary | ICD-10-CM | POA: Insufficient documentation

## 2020-02-26 DIAGNOSIS — M25561 Pain in right knee: Secondary | ICD-10-CM | POA: Diagnosis present

## 2020-02-26 DIAGNOSIS — R7303 Prediabetes: Secondary | ICD-10-CM | POA: Diagnosis not present

## 2020-02-26 DIAGNOSIS — E785 Hyperlipidemia, unspecified: Secondary | ICD-10-CM | POA: Diagnosis not present

## 2020-02-26 DIAGNOSIS — Z92241 Personal history of systemic steroid therapy: Secondary | ICD-10-CM | POA: Diagnosis not present

## 2020-02-26 DIAGNOSIS — Z79899 Other long term (current) drug therapy: Secondary | ICD-10-CM | POA: Diagnosis not present

## 2020-02-26 DIAGNOSIS — R011 Cardiac murmur, unspecified: Secondary | ICD-10-CM | POA: Insufficient documentation

## 2020-02-26 DIAGNOSIS — I10 Essential (primary) hypertension: Secondary | ICD-10-CM | POA: Insufficient documentation

## 2020-02-26 DIAGNOSIS — M1711 Unilateral primary osteoarthritis, right knee: Secondary | ICD-10-CM | POA: Diagnosis not present

## 2020-02-26 HISTORY — PX: TOTAL KNEE ARTHROPLASTY: SHX125

## 2020-02-26 LAB — TYPE AND SCREEN
ABO/RH(D): A POS
Antibody Screen: NEGATIVE

## 2020-02-26 LAB — COMPREHENSIVE METABOLIC PANEL
ALT: 23 U/L (ref 0–44)
AST: 20 U/L (ref 15–41)
Albumin: 4.3 g/dL (ref 3.5–5.0)
Alkaline Phosphatase: 43 U/L (ref 38–126)
Anion gap: 9 (ref 5–15)
BUN: 18 mg/dL (ref 8–23)
CO2: 25 mmol/L (ref 22–32)
Calcium: 8.9 mg/dL (ref 8.9–10.3)
Chloride: 103 mmol/L (ref 98–111)
Creatinine, Ser: 0.83 mg/dL (ref 0.61–1.24)
GFR, Estimated: >60 mL/min (ref >60)
Glucose, Bld: 111 mg/dL — ABNORMAL HIGH (ref 70–99)
Potassium: 3.8 mmol/L (ref 3.5–5.1)
Sodium: 137 mmol/L (ref 135–145)
Total Bilirubin: 0.8 mg/dL (ref 0.3–1.2)
Total Protein: 6.6 g/dL (ref 6.5–8.1)

## 2020-02-26 LAB — CBC
HCT: 41.9 % (ref 39.0–52.0)
HCT: 41.5 % (ref 39.0–52.0)
Hemoglobin: 14 g/dL (ref 13.0–17.0)
Hemoglobin: 13.8 g/dL (ref 13.0–17.0)
MCH: 32 pg (ref 26.0–34.0)
MCH: 32.1 pg (ref 26.0–34.0)
MCHC: 33.4 g/dL (ref 30.0–36.0)
MCHC: 33.3 g/dL (ref 30.0–36.0)
MCV: 95.7 fL (ref 80.0–100.0)
MCV: 96.5 fL (ref 80.0–100.0)
Platelets: 151 10*3/uL (ref 150–400)
Platelets: 151 10*3/uL (ref 150–400)
RBC: 4.38 MIL/uL (ref 4.22–5.81)
RBC: 4.3 MIL/uL (ref 4.22–5.81)
RDW: 12.6 % (ref 11.5–15.5)
RDW: 12.7 % (ref 11.5–15.5)
WBC: 5.3 10*3/uL (ref 4.0–10.5)
WBC: 7.4 10*3/uL (ref 4.0–10.5)
nRBC: 0 % (ref 0.0–0.2)
nRBC: 0 % (ref 0.0–0.2)

## 2020-02-26 LAB — APTT: aPTT: 30 seconds (ref 24–36)

## 2020-02-26 LAB — GLUCOSE, CAPILLARY: Glucose-Capillary: 98 mg/dL (ref 70–99)

## 2020-02-26 LAB — PROTIME-INR
INR: 1 (ref 0.8–1.2)
Prothrombin Time: 12.7 seconds (ref 11.4–15.2)

## 2020-02-26 LAB — HIV ANTIBODY (ROUTINE TESTING W REFLEX): HIV Screen 4th Generation wRfx: NONREACTIVE

## 2020-02-26 LAB — CREATININE, SERUM
Creatinine, Ser: 0.82 mg/dL (ref 0.61–1.24)
GFR, Estimated: >60 mL/min (ref >60)

## 2020-02-26 SURGERY — ARTHROPLASTY, KNEE, TOTAL
Anesthesia: Regional | Site: Knee | Laterality: Right

## 2020-02-26 MED ORDER — OXYCODONE HCL 5 MG/5ML PO SOLN: 5.0000 mg | Freq: Once | ORAL | Status: AC | PRN

## 2020-02-26 MED ORDER — ENOXAPARIN SODIUM 40 MG/0.4ML ~~LOC~~ SOLN
40.0000 mg | SUBCUTANEOUS | Status: DC
  Administered 2020-02-27: 40 mg via SUBCUTANEOUS
  Filled 2020-02-26: qty 0.4

## 2020-02-26 MED ORDER — ACETAMINOPHEN 325 MG PO TABS: 325.0000 mg | ORAL_TABLET | Freq: Four times a day (QID) | ORAL | Status: DC | PRN

## 2020-02-26 MED ORDER — ATORVASTATIN CALCIUM 10 MG PO TABS
10.0000 mg | ORAL_TABLET | Freq: Every day | ORAL | Status: DC
  Administered 2020-02-26: 10 mg via ORAL
  Filled 2020-02-26: qty 1

## 2020-02-26 MED ORDER — MAGNESIUM CITRATE PO SOLN: 1.0000 | Freq: Once | ORAL | Status: DC | PRN

## 2020-02-26 MED ORDER — BUPIVACAINE LIPOSOME 1.3 % IJ SUSP
INTRAMUSCULAR | Status: DC | PRN
  Administered 2020-02-26: 20 mL

## 2020-02-26 MED ORDER — HYDROMORPHONE HCL 1 MG/ML IJ SOLN
INTRAMUSCULAR | Status: AC
  Filled 2020-02-26: qty 1

## 2020-02-26 MED ORDER — PROMETHAZINE HCL 25 MG/ML IJ SOLN: 6.2500 mg | INTRAMUSCULAR | Status: DC | PRN

## 2020-02-26 MED ORDER — DEXAMETHASONE SODIUM PHOSPHATE 10 MG/ML IJ SOLN: 8.0000 mg | Freq: Once | INTRAMUSCULAR | Status: DC

## 2020-02-26 MED ORDER — ONDANSETRON HCL 4 MG/2ML IJ SOLN
INTRAMUSCULAR | Status: DC | PRN
  Administered 2020-02-26: 4 mg via INTRAVENOUS

## 2020-02-26 MED ORDER — FENTANYL CITRATE (PF) 100 MCG/2ML IJ SOLN
INTRAMUSCULAR | Status: AC
  Filled 2020-02-26: qty 2

## 2020-02-26 MED ORDER — LIDOCAINE HCL (CARDIAC) PF 100 MG/5ML IV SOSY
PREFILLED_SYRINGE | INTRAVENOUS | Status: DC | PRN
  Administered 2020-02-26: 80 mg via INTRAVENOUS

## 2020-02-26 MED ORDER — AMLODIPINE BESYLATE 10 MG PO TABS
10.0000 mg | ORAL_TABLET | Freq: Every day | ORAL | Status: DC
  Administered 2020-02-27: 10 mg via ORAL
  Filled 2020-02-26: qty 1

## 2020-02-26 MED ORDER — OXYCODONE HCL 5 MG PO TABS
5.0000 mg | ORAL_TABLET | Freq: Once | ORAL | Status: AC | PRN
  Administered 2020-02-26: 5 mg via ORAL

## 2020-02-26 MED ORDER — ASPIRIN EC 81 MG PO TBEC: 81.0000 mg | DELAYED_RELEASE_TABLET | Freq: Two times a day (BID) | ORAL | Status: AC

## 2020-02-26 MED ORDER — SENNOSIDES-DOCUSATE SODIUM 8.6-50 MG PO TABS: 1.0000 | ORAL_TABLET | Freq: Every evening | ORAL | Status: DC | PRN

## 2020-02-26 MED ORDER — ONDANSETRON HCL 4 MG PO TABS: 4.0000 mg | ORAL_TABLET | Freq: Four times a day (QID) | ORAL | Status: DC | PRN

## 2020-02-26 MED ORDER — FENTANYL CITRATE (PF) 100 MCG/2ML IJ SOLN
25.0000 ug | INTRAMUSCULAR | Status: DC | PRN
  Administered 2020-02-26 (×3): 50 ug via INTRAVENOUS

## 2020-02-26 MED ORDER — OXYCODONE HCL 5 MG PO TABS
5.0000 mg | ORAL_TABLET | ORAL | Status: DC | PRN
  Administered 2020-02-26: 5 mg via ORAL
  Administered 2020-02-26: 10 mg via ORAL
  Filled 2020-02-26 (×2): qty 2
  Filled 2020-02-26 (×2): qty 1

## 2020-02-26 MED ORDER — MIDAZOLAM HCL 2 MG/2ML IJ SOLN
INTRAMUSCULAR | Status: DC | PRN
  Administered 2020-02-26: 2 mg via INTRAVENOUS

## 2020-02-26 MED ORDER — BUPIVACAINE HCL (PF) 0.5 % IJ SOLN
INTRAMUSCULAR | Status: DC | PRN
  Administered 2020-02-26: 20 mL

## 2020-02-26 MED ORDER — SODIUM CHLORIDE 0.9 % IR SOLN
Status: DC | PRN
  Administered 2020-02-26: 3000 mL

## 2020-02-26 MED ORDER — OXYCODONE HCL 5 MG PO TABS
ORAL_TABLET | ORAL | Status: AC
  Filled 2020-02-26: qty 1

## 2020-02-26 MED ORDER — ACETAMINOPHEN 500 MG PO TABS
1000.0000 mg | ORAL_TABLET | Freq: Four times a day (QID) | ORAL | Status: AC
  Administered 2020-02-26 – 2020-02-27 (×4): 1000 mg via ORAL
  Filled 2020-02-26 (×4): qty 2

## 2020-02-26 MED ORDER — ONDANSETRON HCL 4 MG/2ML IJ SOLN
INTRAMUSCULAR | Status: AC
  Filled 2020-02-26: qty 2

## 2020-02-26 MED ORDER — OXYCODONE HCL 5 MG PO TABS: 5.0000 mg | ORAL_TABLET | ORAL | 0 refills | Status: AC | PRN

## 2020-02-26 MED ORDER — HYDROMORPHONE HCL 1 MG/ML IJ SOLN
INTRAMUSCULAR | Status: AC
  Filled 2020-02-26: qty 2

## 2020-02-26 MED ORDER — METHOCARBAMOL 500 MG PO TABS: 500.0000 mg | ORAL_TABLET | Freq: Four times a day (QID) | ORAL | 0 refills | Status: DC

## 2020-02-26 MED ORDER — CEFAZOLIN SODIUM-DEXTROSE 1-4 GM/50ML-% IV SOLN
1.0000 g | Freq: Three times a day (TID) | INTRAVENOUS | Status: AC
  Administered 2020-02-26 – 2020-02-27 (×3): 1 g via INTRAVENOUS
  Filled 2020-02-26 (×3): qty 50

## 2020-02-26 MED ORDER — FENTANYL CITRATE (PF) 250 MCG/5ML IJ SOLN
INTRAMUSCULAR | Status: AC
  Filled 2020-02-26: qty 5

## 2020-02-26 MED ORDER — PROPOFOL 10 MG/ML IV BOLUS
INTRAVENOUS | Status: AC
  Filled 2020-02-26: qty 40

## 2020-02-26 MED ORDER — FUROSEMIDE 40 MG PO TABS
40.0000 mg | ORAL_TABLET | Freq: Every day | ORAL | Status: DC
  Administered 2020-02-27: 40 mg via ORAL
  Filled 2020-02-26: qty 1

## 2020-02-26 MED ORDER — METHOCARBAMOL 500 MG IVPB - SIMPLE MED
INTRAVENOUS | Status: AC
  Filled 2020-02-26: qty 50

## 2020-02-26 MED ORDER — DEXAMETHASONE SODIUM PHOSPHATE 10 MG/ML IJ SOLN
INTRAMUSCULAR | Status: AC
  Filled 2020-02-26: qty 1

## 2020-02-26 MED ORDER — OXYCODONE HCL 5 MG PO TABS
10.0000 mg | ORAL_TABLET | ORAL | Status: DC | PRN
  Administered 2020-02-27: 10 mg via ORAL
  Administered 2020-02-27: 15 mg via ORAL
  Filled 2020-02-26: qty 2

## 2020-02-26 MED ORDER — ONDANSETRON HCL 4 MG/2ML IJ SOLN: 4.0000 mg | Freq: Four times a day (QID) | INTRAMUSCULAR | Status: DC | PRN

## 2020-02-26 MED ORDER — METHOCARBAMOL 500 MG IVPB - SIMPLE MED
500.0000 mg | Freq: Four times a day (QID) | INTRAVENOUS | Status: DC | PRN
  Administered 2020-02-26: 500 mg via INTRAVENOUS
  Filled 2020-02-26: qty 50

## 2020-02-26 MED ORDER — PROPOFOL 10 MG/ML IV BOLUS
INTRAVENOUS | Status: DC | PRN
  Administered 2020-02-26: 50 mg via INTRAVENOUS
  Administered 2020-02-26: 200 mg via INTRAVENOUS

## 2020-02-26 MED ORDER — KETOROLAC TROMETHAMINE 30 MG/ML IJ SOLN
INTRAMUSCULAR | Status: AC
  Filled 2020-02-26: qty 1

## 2020-02-26 MED ORDER — ASPIRIN EC 81 MG PO TBEC: 81.0000 mg | DELAYED_RELEASE_TABLET | Freq: Two times a day (BID) | ORAL | 0 refills | Status: AC

## 2020-02-26 MED ORDER — ACETAMINOPHEN 10 MG/ML IV SOLN
INTRAVENOUS | Status: AC
  Filled 2020-02-26: qty 100

## 2020-02-26 MED ORDER — TRAMADOL HCL 50 MG PO TABS
50.0000 mg | ORAL_TABLET | Freq: Four times a day (QID) | ORAL | Status: DC
  Administered 2020-02-26 – 2020-02-27 (×4): 50 mg via ORAL
  Filled 2020-02-26 (×4): qty 1

## 2020-02-26 MED ORDER — AMISULPRIDE (ANTIEMETIC) 5 MG/2ML IV SOLN: 10.0000 mg | Freq: Once | INTRAVENOUS | Status: DC | PRN

## 2020-02-26 MED ORDER — IRRISEPT - 450ML BOTTLE WITH 0.05% CHG IN STERILE WATER, USP 99.95% OPTIME
TOPICAL | Status: DC | PRN
  Administered 2020-02-26: 450 mL

## 2020-02-26 MED ORDER — HYDROMORPHONE HCL 1 MG/ML IJ SOLN
INTRAMUSCULAR | Status: DC | PRN
Start: 2020-02-26 — End: 2020-02-26
  Administered 2020-02-26: 1 mg via INTRAVENOUS

## 2020-02-26 MED ORDER — SODIUM CHLORIDE (PF) 0.9 % IJ SOLN
INTRAMUSCULAR | Status: DC | PRN
  Administered 2020-02-26: 60 mL

## 2020-02-26 MED ORDER — SODIUM CHLORIDE (PF) 0.9 % IJ SOLN
INTRAMUSCULAR | Status: AC
  Filled 2020-02-26: qty 10

## 2020-02-26 MED ORDER — MIDAZOLAM HCL 2 MG/2ML IJ SOLN
INTRAMUSCULAR | Status: AC
  Filled 2020-02-26: qty 2

## 2020-02-26 MED ORDER — MECLIZINE HCL 25 MG PO TABS: 25.0000 mg | ORAL_TABLET | Freq: Every day | ORAL | Status: DC | PRN

## 2020-02-26 MED ORDER — ONDANSETRON HCL 4 MG PO TABS: 4.0000 mg | ORAL_TABLET | Freq: Every day | ORAL | 1 refills | Status: AC | PRN

## 2020-02-26 MED ORDER — TRANEXAMIC ACID-NACL 1000-0.7 MG/100ML-% IV SOLN
1000.0000 mg | INTRAVENOUS | Status: AC
  Administered 2020-02-26: 1000 mg via INTRAVENOUS
  Filled 2020-02-26: qty 100

## 2020-02-26 MED ORDER — MECLIZINE HCL 25 MG PO TABS
25.0000 mg | ORAL_TABLET | Freq: Every day | ORAL | Status: DC
  Administered 2020-02-26 – 2020-02-27 (×2): 25 mg via ORAL
  Filled 2020-02-26 (×2): qty 1

## 2020-02-26 MED ORDER — CHLORHEXIDINE GLUCONATE 0.12 % MT SOLN
15.0000 mL | Freq: Once | OROMUCOSAL | Status: AC
  Administered 2020-02-26: 15 mL via OROMUCOSAL

## 2020-02-26 MED ORDER — 0.9 % SODIUM CHLORIDE (POUR BTL) OPTIME
TOPICAL | Status: DC | PRN
  Administered 2020-02-26: 1000 mL

## 2020-02-26 MED ORDER — LACTATED RINGERS IV SOLN: INTRAVENOUS | Status: DC

## 2020-02-26 MED ORDER — KETOROLAC TROMETHAMINE 30 MG/ML IJ SOLN
30.0000 mg | Freq: Once | INTRAMUSCULAR | Status: AC
  Administered 2020-02-26: 30 mg via INTRAVENOUS

## 2020-02-26 MED ORDER — HYDROMORPHONE HCL 1 MG/ML IJ SOLN
0.2500 mg | INTRAMUSCULAR | Status: DC | PRN
  Administered 2020-02-26 (×4): 0.5 mg via INTRAVENOUS

## 2020-02-26 MED ORDER — LIDOCAINE HCL (PF) 2 % IJ SOLN
INTRAMUSCULAR | Status: AC
  Filled 2020-02-26: qty 5

## 2020-02-26 MED ORDER — DEXAMETHASONE SODIUM PHOSPHATE 10 MG/ML IJ SOLN
INTRAMUSCULAR | Status: DC | PRN
  Administered 2020-02-26: 10 mg via INTRAVENOUS

## 2020-02-26 MED ORDER — HYDROMORPHONE HCL 1 MG/ML IJ SOLN
0.5000 mg | INTRAMUSCULAR | Status: DC | PRN
  Administered 2020-02-26: 1 mg via INTRAVENOUS
  Filled 2020-02-26: qty 1

## 2020-02-26 MED ORDER — GABAPENTIN 300 MG PO CAPS
300.0000 mg | ORAL_CAPSULE | Freq: Three times a day (TID) | ORAL | Status: DC
  Administered 2020-02-26: 300 mg via ORAL
  Filled 2020-02-26 (×2): qty 1

## 2020-02-26 MED ORDER — FENTANYL CITRATE (PF) 250 MCG/5ML IJ SOLN
INTRAMUSCULAR | Status: DC | PRN
  Administered 2020-02-26 (×10): 50 ug via INTRAVENOUS

## 2020-02-26 MED ORDER — DIPHENHYDRAMINE HCL 12.5 MG/5ML PO ELIX: 12.5000 mg | ORAL_SOLUTION | ORAL | Status: DC | PRN

## 2020-02-26 MED ORDER — ORAL CARE MOUTH RINSE: 15.0000 mL | Freq: Once | OROMUCOSAL | Status: AC

## 2020-02-26 MED ORDER — DEXTROSE 5 % IV SOLN
3.0000 g | INTRAVENOUS | Status: AC
  Administered 2020-02-26: 3 g via INTRAVENOUS
  Filled 2020-02-26: qty 3

## 2020-02-26 MED ORDER — POVIDONE-IODINE 10 % EX SWAB
2.0000 "application " | Freq: Once | CUTANEOUS | Status: AC
  Administered 2020-02-26: 2 via TOPICAL

## 2020-02-26 MED ORDER — SODIUM CHLORIDE 0.9 % IV SOLN: INTRAVENOUS | Status: DC

## 2020-02-26 MED ORDER — LISINOPRIL 20 MG PO TABS
40.0000 mg | ORAL_TABLET | Freq: Every day | ORAL | Status: DC
  Administered 2020-02-26 – 2020-02-27 (×2): 40 mg via ORAL
  Filled 2020-02-26 (×3): qty 2

## 2020-02-26 MED ORDER — ACETAMINOPHEN 10 MG/ML IV SOLN
1000.0000 mg | Freq: Once | INTRAVENOUS | Status: AC
  Administered 2020-02-26: 1000 mg via INTRAVENOUS

## 2020-02-26 MED ORDER — METHOCARBAMOL 500 MG PO TABS
500.0000 mg | ORAL_TABLET | Freq: Four times a day (QID) | ORAL | Status: DC | PRN
  Administered 2020-02-26 – 2020-02-27 (×2): 500 mg via ORAL
  Filled 2020-02-26 (×2): qty 1

## 2020-02-26 MED ORDER — STERILE WATER FOR IRRIGATION IR SOLN
Status: DC | PRN
  Administered 2020-02-26: 1000 mL

## 2020-02-26 MED ORDER — BISACODYL 5 MG PO TBEC: 5.0000 mg | DELAYED_RELEASE_TABLET | Freq: Every day | ORAL | Status: DC | PRN

## 2020-02-26 SURGICAL SUPPLY — 63 items
ATTUNE MED DOME PAT 41 KNEE (Knees) ×2 IMPLANT
ATTUNE PS FEM RT SZ9 CEM KNEE (Femur) ×2 IMPLANT
ATTUNE PS RP KNEE INSR SZ9 7 (Insert) ×2 IMPLANT
BAG ZIPLOCK 12X15 (MISCELLANEOUS) ×2 IMPLANT
BASE TIBIAL ROT PLAT SZ 10 KNE (Miscellaneous) ×1 IMPLANT
BLADE SAG 18X100X1.27 (BLADE) ×2 IMPLANT
BLADE SAW SGTL 11.0X1.19X90.0M (BLADE) ×2 IMPLANT
BNDG ELASTIC 4X5.8 VLCR STR LF (GAUZE/BANDAGES/DRESSINGS) ×2 IMPLANT
BNDG ELASTIC 6X10 VLCR STRL LF (GAUZE/BANDAGES/DRESSINGS) ×2 IMPLANT
BNDG ELASTIC 6X5.8 VLCR STR LF (GAUZE/BANDAGES/DRESSINGS) ×2 IMPLANT
BOWL SMART MIX CTS (DISPOSABLE) ×2 IMPLANT
CEMENT HV SMART SET (Cement) ×4 IMPLANT
COVER SURGICAL LIGHT HANDLE (MISCELLANEOUS) ×2 IMPLANT
COVER WAND RF STERILE (DRAPES) ×2 IMPLANT
CUFF TOURN SGL QUICK 34 (TOURNIQUET CUFF) ×1
CUFF TRNQT CYL 34X4.125X (TOURNIQUET CUFF) ×1 IMPLANT
DECANTER SPIKE VIAL GLASS SM (MISCELLANEOUS) ×2 IMPLANT
DERMABOND ADVANCED (GAUZE/BANDAGES/DRESSINGS) ×1
DERMABOND ADVANCED .7 DNX12 (GAUZE/BANDAGES/DRESSINGS) ×1 IMPLANT
DRAPE U-SHAPE 47X51 STRL (DRAPES) ×2 IMPLANT
DRSG AQUACEL AG ADV 3.5X10 (GAUZE/BANDAGES/DRESSINGS) ×2 IMPLANT
DRSG TEGADERM 4X4.75 (GAUZE/BANDAGES/DRESSINGS) ×2 IMPLANT
DURAPREP 26ML APPLICATOR (WOUND CARE) ×4 IMPLANT
ELECT REM PT RETURN 15FT ADLT (MISCELLANEOUS) ×2 IMPLANT
EVACUATOR 1/8 PVC DRAIN (DRAIN) ×2 IMPLANT
GAUZE SPONGE 2X2 8PLY STRL LF (GAUZE/BANDAGES/DRESSINGS) ×1 IMPLANT
GLOVE ECLIPSE 8.0 STRL XLNG CF (GLOVE) ×2 IMPLANT
GLOVE SRG 8 PF TXTR STRL LF DI (GLOVE) ×1 IMPLANT
GLOVE SURG ORTHO 9.0 STRL STRW (GLOVE) ×2 IMPLANT
GLOVE SURG POLYISO LF SZ7 (GLOVE) ×2 IMPLANT
GLOVE SURG UNDER POLY LF SZ7.5 (GLOVE) ×2 IMPLANT
GLOVE SURG UNDER POLY LF SZ8 (GLOVE) ×1
GOWN STRL REUS W/TWL XL LVL3 (GOWN DISPOSABLE) ×4 IMPLANT
HANDPIECE INTERPULSE COAX TIP (DISPOSABLE) ×1
JET LAVAGE IRRISEPT WOUND (IRRIGATION / IRRIGATOR) ×2
KIT TURNOVER KIT A (KITS) ×2 IMPLANT
LAVAGE JET IRRISEPT WOUND (IRRIGATION / IRRIGATOR) ×1 IMPLANT
NS IRRIG 1000ML POUR BTL (IV SOLUTION) ×2 IMPLANT
PACK TOTAL KNEE CUSTOM (KITS) ×2 IMPLANT
PENCIL SMOKE EVACUATOR (MISCELLANEOUS) IMPLANT
PIN DRILL FIX HALF THREAD (BIT) ×2 IMPLANT
PIN STEINMAN FIXATION KNEE (PIN) ×2 IMPLANT
PROTECTOR NERVE ULNAR (MISCELLANEOUS) ×2 IMPLANT
SET HNDPC FAN SPRY TIP SCT (DISPOSABLE) ×1 IMPLANT
SET PAD KNEE POSITIONER (MISCELLANEOUS) ×2 IMPLANT
SPONGE GAUZE 2X2 STER 10/PKG (GAUZE/BANDAGES/DRESSINGS) ×1
SPONGE LAP 18X18 RF (DISPOSABLE) IMPLANT
SPONGE SURGIFOAM ABS GEL 100 (HEMOSTASIS) ×2 IMPLANT
STOCKINETTE 6  STRL (DRAPES) ×1
STOCKINETTE 6 STRL (DRAPES) ×1 IMPLANT
SUT BONE WAX W31G (SUTURE) IMPLANT
SUT MNCRL AB 3-0 PS2 18 (SUTURE) ×2 IMPLANT
SUT VIC AB 1 CT1 27 (SUTURE) ×3
SUT VIC AB 1 CT1 27XBRD ANTBC (SUTURE) ×3 IMPLANT
SUT VIC AB 2-0 CT1 27 (SUTURE) ×2
SUT VIC AB 2-0 CT1 TAPERPNT 27 (SUTURE) ×2 IMPLANT
SUT VLOC 180 0 24IN GS25 (SUTURE) ×2 IMPLANT
SYR 50ML LL SCALE MARK (SYRINGE) ×2 IMPLANT
TAPE STRIPS DRAPE STRL (GAUZE/BANDAGES/DRESSINGS) ×2 IMPLANT
TIBIAL BASE ROT PLAT SZ 10 KNE (Miscellaneous) ×2 IMPLANT
TRAY CATH INTERMITTENT SS 16FR (CATHETERS) ×2 IMPLANT
WATER STERILE IRR 1000ML POUR (IV SOLUTION) ×4 IMPLANT
WRAP KNEE MAXI GEL POST OP (GAUZE/BANDAGES/DRESSINGS) ×2 IMPLANT

## 2020-02-26 NOTE — Anesthesia Postprocedure Evaluation (Signed)
Anesthesia Post Note  Patient: Travis Myers  Procedure(s) Performed: TOTAL KNEE ARTHROPLASTY (Right Knee)     Patient location during evaluation: PACU Anesthesia Type: Regional and General Level of consciousness: awake and alert Pain management: pain level controlled Vital Signs Assessment: post-procedure vital signs reviewed and stable Respiratory status: spontaneous breathing, nonlabored ventilation and respiratory function stable Cardiovascular status: blood pressure returned to baseline and stable Postop Assessment: no apparent nausea or vomiting Anesthetic complications: no   No complications documented.  Last Vitals:  Vitals:   02/26/20 1245 02/26/20 1300  BP: (!) 152/94 133/87  Pulse: 69 62  Resp: 18 (!) 9  Temp:  37.3 C  SpO2: 100% 98%    Last Pain:  Vitals:   02/26/20 1300  TempSrc:   PainSc: Asleep                 Merlinda Frederick

## 2020-02-26 NOTE — Evaluation (Signed)
Physical Therapy Evaluation Patient Details Name: Travis Myers MRN: [id number] DOB: Jul 04, 1955 Today's Date: 02/26/2020   History of Present Illness  Patient is a 65 y.o. male s/p Rt TKA on 02/26/20 with PMH significant for HTN, HLD, pre-diabetes, sleep apnea, AAA, meniscal tear Lt knee, R THA (2016), L THA (2011), lumbar laminectomy/decompression (2018).    Clinical Impression  Travis Myers is a 65 y.o. male POD 0 s/p Rt TKA. Patient reports independence with mobility at baseline. Patient is now limited by functional impairments (see PT problem list below) and requires min assist for bed mobility and transfers with RW. Patient was limited by dizziness related to chronic vertigo and Rt knee pain. He took several small side steps at EOB with RW and min assist. Patient instructed in exercise to facilitate circulation to reduce risk of DVT. Patient will benefit from continued skilled PT interventions to address impairments and progress towards PLOF. Acute PT will follow to progress mobility and stair training in preparation for safe discharge home.     Follow Up Recommendations Follow surgeon's recommendation for DC plan and follow-up therapies;Outpatient PT    Equipment Recommendations  None recommended by PT    Recommendations for Other Services       Precautions / Restrictions Precautions Precautions: Fall Restrictions Weight Bearing Restrictions: No Other Position/Activity Restrictions: WBAT      Mobility  Bed Mobility Overal bed mobility: Needs Assistance Bed Mobility: Sit to Supine;Rolling;Sidelying to Sit Rolling: Min guard Sidelying to sit: Min assist;HOB elevated   Sit to supine: Min assist   General bed mobility comments: cues for log roll technique to reduce dizzy sensation as pt has chronic vertigo. min assist to raise trunk up from side.    Transfers Overall transfer level: Needs assistance Equipment used: Rolling walker (2 wheeled) Transfers:  Sit to/from Stand Sit to Stand: Min assist;From elevated surface         General transfer comment: VC's for hand placement and min assist for power up and to steady in standing. pt with reduced weight shift to Rt due to knee pain.  Ambulation/Gait             General Gait Details: small side steps at EOB to move towards HOB, min assist to manage walker position, no buckling at Rt knee.  Stairs            Wheelchair Mobility    Modified Rankin (Stroke Patients Only)       Balance Overall balance assessment: Needs assistance Sitting-balance support: Feet supported Sitting balance-Leahy Scale: Good     Standing balance support: During functional activity;Bilateral upper extremity supported Standing balance-Leahy Scale: Fair                               Pertinent Vitals/Pain Pain Assessment: 0-10 Pain Score: 9  Pain Location: Rt knee Pain Descriptors / Indicators: Aching;Discomfort;Guarding;Grimacing Pain Intervention(s): Limited activity within patient's tolerance;Monitored during session;Premedicated before session;Repositioned;Ice applied;Patient requesting pain meds-RN notified    Home Living Family/patient expects to be discharged to:: Private residence Living Arrangements: Spouse/significant other Available Help at Discharge: Family Type of Home: House Home Access: Stairs to enter Entrance Stairs-Rails: Right Entrance Stairs-Number of Steps: 3 Home Layout: One level Home Equipment: [place] - 2 wheels;Bedside commode;Cane - single point      Prior Function Level of Independence: Independent  Hand Dominance   Dominant Hand: Right    Extremity/Trunk Assessment   Upper Extremity Assessment Upper Extremity Assessment: Overall WFL for tasks assessed    Lower Extremity Assessment Lower Extremity Assessment: RLE deficits/detail RLE Deficits / Details: good quad activation with no extensor lag however pt limited by  pain RLE: Unable to fully assess due to pain RLE Sensation: WNL RLE Coordination: WNL    Cervical / Trunk Assessment Cervical / Trunk Assessment: Normal  Communication   Communication: No difficulties  Cognition Arousal/Alertness: Awake/alert Behavior During Therapy: WFL for tasks assessed/performed Overall Cognitive Status: Within Functional Limits for tasks assessed                                        General Comments      Exercises Total Joint Exercises Ankle Circles/Pumps: AROM;Both;10 reps;Supine   Assessment/Plan    PT Assessment Patient needs continued PT services  PT Problem List Decreased strength;Decreased activity tolerance;Decreased range of motion;Decreased balance;Decreased mobility;Decreased cognition;Decreased coordination;Decreased knowledge of precautions;Pain       PT Treatment Interventions DME instruction;Gait training;Stair training;Functional mobility training;Therapeutic activities;Therapeutic exercise;Balance training;Neuromuscular re-education;Patient/family education    PT Goals (Current goals can be found in the Care Plan section)  Acute Rehab PT Goals Patient Stated Goal: stop hurting so badly and also fix vertigo PT Goal Formulation: With patient Time For Goal Achievement: 03/04/20 Potential to Achieve Goals: Good    Frequency 7X/week   Barriers to discharge        Co-evaluation               AM-PAC PT "6 Clicks" Mobility  Outcome Measure Help needed turning from your back to your side while in a flat bed without using bedrails?: A Little Help needed moving from lying on your back to sitting on the side of a flat bed without using bedrails?: A Little Help needed moving to and from a bed to a chair (including a wheelchair)?: A Little Help needed standing up from a chair using your arms (e.g., wheelchair or bedside chair)?: A Little Help needed to walk in hospital room?: A Lot Help needed climbing 3-5 steps  with a railing? : A Lot 6 Click Score: 16    End of Session Equipment Utilized During Treatment: Gait belt Activity Tolerance: Patient limited by pain Patient left: in bed;with bed alarm set;with call bell/phone within reach Nurse Communication: Mobility status;Patient requests pain meds PT Visit Diagnosis: Muscle weakness (generalized) (M62.81);Difficulty in walking, not elsewhere classified (R26.2);Pain;Dizziness and giddiness (R42) Pain - Right/Left: Right Pain - part of body: Knee    Time: 5597-4163 PT Time Calculation (min) (ACUTE ONLY): 32 min   Charges:   PT Evaluation $PT Eval Low Complexity: 1 Low PT Treatments $Therapeutic Activity: 8-22 mins        Verner Mould, DPT Acute Rehabilitation Services Office [phone] Pager [phone]    Jacques Navy 02/26/2020, 5:24 PM

## 2020-02-26 NOTE — Interval H&P Note (Signed)
History and Physical Interval Note:  02/26/2020 8:37 AM  Travis Myers  has presented today for surgery, with the diagnosis of Right knee osteoarthritis.  The various methods of treatment have been discussed with the patient and family. After consideration of risks, benefits and other options for treatment, the patient has consented to  Procedure(s) with comments: TOTAL KNEE ARTHROPLASTY (Right) - adductor canal as a surgical intervention.  The patient's history has been reviewed, patient examined, no change in status, stable for surgery.  I have reviewed the patient's chart and labs.  Questions were answered to the patient's satisfaction.     [name] ANDREW

## 2020-02-26 NOTE — Anesthesia Procedure Notes (Signed)
Anesthesia Regional Block: Adductor canal block   Pre-Anesthetic Checklist: ,, timeout performed, Correct Patient, Correct Site, Correct Laterality, Correct Procedure, Correct Position, site marked, Risks and benefits discussed,  Surgical consent,  Pre-op evaluation,  At surgeon's request and post-op pain management  Laterality: Right  Prep: chloraprep       Needles:  Injection technique: Single-shot  Needle Type: Echogenic Stimulator Needle     Needle Length: 10cm  Needle Gauge: 20     Additional Needles:   Procedures:,,,, ultrasound used (permanent image in chart),,,,  Narrative:  Start time: 02/26/2020 7:55 AM End time: 02/26/2020 8:00 AM Injection made incrementally with aspirations every 5 mL.  Performed by: Personally  Anesthesiologist: Merlinda Frederick, MD  Additional Notes: A functioning IV was confirmed and monitors were applied.  Sterile prep and drape, hand hygiene and sterile gloves were used.  Negative aspiration and test dose prior to incremental administration of local anesthetic. The patient tolerated the procedure well.Ultrasound  guidance: relevant anatomy identified, needle position confirmed, local anesthetic spread visualized around nerve(s), vascular puncture avoided.  Image printed for medical record.

## 2020-02-26 NOTE — Op Note (Signed)
DATE OF SURGERY:  02/26/2020  TIME: 10:51 AM  PATIENT NAME:  Travis Myers    AGE: 65 y.o.   PRE-OPERATIVE DIAGNOSIS:  Right knee osteoarthritis  POST-OPERATIVE DIAGNOSIS:  Right knee osteoarthritis  PROCEDURE:  Procedure(s): TOTAL KNEE ARTHROPLASTY  SURGEON:  Advaith Lamarque ANDREW  ASSISTANT:  Leeanne Haus, PA-C, present and scrubbed throughout the case, critical for assistance with exposure, retraction, instrumentation, and closure.  OPERATIVE IMPLANTS: Depuy PFC Attune Rotating Platform.  Femur size 9, Tibia size 10, Patella size 45 3-peg oval button, with a 7 mm polyethylene insert.   PREOPERATIVE INDICATIONS:   Travis Myers is a 65 y.o. year old male with end stage bone on bone arthritis of the knee who failed conservative treatment and elected for Total Knee Arthroplasty.   The risks, benefits, and alternatives were discussed at length including but not limited to the risks of infection, bleeding, nerve injury, stiffness, blood clots, the need for revision surgery, cardiopulmonary complications, among others, and they were willing to proceed.  OPERATIVE DESCRIPTION:  The patient was brought to the operative room and placed in a supine position.  Spinal anesthesia was administered.  IV antibiotics were given.  The lower extremity was prepped and draped in the usual sterile fashion.  Time out was performed.  The leg was elevated and exsanguinated and the tourniquet was inflated.  Anterior quadriceps tendon splitting approach was performed.  The patella was retracted and osteophytes were removed.  The anterior horn of the medial and lateral meniscus was removed and cruciate ligaments resected.   The distal femur was opened with the drill and the intramedullary distal femoral cutting jig was utilized, set at 5 degrees resecting 10 mm off the distal femur.  Care was taken to protect the collateral ligaments.  The distal femoral sizing jig was applied, taking  care to avoid notching.  Then the 4-in-1 cutting jig was applied and the anterior and posterior femur was cut, along with the chamfer cuts.    Then the extramedullary tibial cutting jig was utilized making the appropriate cut using the anterior tibial crest as a reference building in appropriate posterior slope.  Care was taken during the cut to protect the medial and collateral ligaments.  The proximal tibia was removed along with the posterior horns of the menisci.   The posterior medial femoral osteophytes and posterior lateral femoral osteophytes were removed.    The flexion gap was then measured and was symmetric with the extension gap, measured at 7.  I completed the distal femoral preparation using the appropriate jig to prepare the box.  The patella was then measured, and cut with the saw.    The proximal tibia sized and prepared accordingly with the reamer and the punch, and then all components were trialed with the trial insert.  The knee was found to have excellent balance and full motion.    The above named components were then cemented into place and all excess cement was removed.  The trial polyethylene component was in place during cementation, and then was exchanged for the real polyethylene component.    The knee was easily taken through a range of motion and the patella tracked well and the knee irrigated copiously and the parapatellar and subcutaneous tissue closed with vicryl, and monocryl with steri strips for the skin.  The arthrotomy was closed at 90 of flexion. The wounds were dressed with sterile gauze and the tourniquet released and the patient was awakened and returned to the  PACU in stable and satisfactory condition.  There were no complications.  Total tourniquet time was 85 minutes.

## 2020-02-26 NOTE — Interval H&P Note (Signed)
History and Physical Interval Note:  02/26/2020 10:51 AM  Travis Myers  has presented today for surgery, with the diagnosis of Right knee osteoarthritis.  The various methods of treatment have been discussed with the patient and family. After consideration of risks, benefits and other options for treatment, the patient has consented to  Procedure(s) with comments: TOTAL KNEE ARTHROPLASTY (Right) - adductor canal as a surgical intervention.  The patient's history has been reviewed, patient examined, no change in status, stable for surgery.  I have reviewed the patient's chart and labs.  Questions were answered to the patient's satisfaction.     Bhavya Eschete ANDREW

## 2020-02-26 NOTE — Transfer of Care (Signed)
Immediate Anesthesia Transfer of Care Note  Patient: Travis Myers  Procedure(s) Performed: TOTAL KNEE ARTHROPLASTY (Right Knee)  Patient Location: PACU  Anesthesia Type:GA combined with regional for post-op pain  Level of Consciousness: awake, alert , oriented and patient cooperative  Airway & Oxygen Therapy: Patient Spontanous Breathing and Patient connected to face mask oxygen  Post-op Assessment: Report given to RN and Post -op Vital signs reviewed and stable  Post vital signs: Reviewed and stable  Last Vitals:  Vitals Value Taken Time  BP 164/91 02/26/20 1105  Temp    Pulse 76 02/26/20 1107  Resp 18 02/26/20 1107  SpO2 99 % 02/26/20 1107  Vitals shown include unvalidated device data.  Last Pain:  Vitals:   02/26/20 0719  TempSrc:   PainSc: 0-No pain      Patients Stated Pain Goal: 3 (97/18/20 9906)  Complications: No complications documented.

## 2020-02-26 NOTE — Anesthesia Procedure Notes (Signed)
Procedure Name: LMA Insertion Date/Time: 02/26/2020 8:49 AM Performed by: Raenette Rover, CRNA Pre-anesthesia Checklist: Patient identified, Suction available, Emergency Drugs available and Patient being monitored Patient Re-evaluated:Patient Re-evaluated prior to induction Oxygen Delivery Method: Circle system utilized Preoxygenation: Pre-oxygenation with 100% oxygen Induction Type: IV induction LMA: LMA inserted LMA Size: 5.0 Number of attempts: 1 Placement Confirmation: positive ETCO2 and breath sounds checked- equal and bilateral Tube secured with: Tape Dental Injury: Teeth and Oropharynx as per pre-operative assessment

## 2020-02-26 NOTE — Interval H&P Note (Signed)
History and Physical Interval Note:  02/26/2020 8:38 AM  Travis Myers  has presented today for surgery, with the diagnosis of Right knee osteoarthritis.  The various methods of treatment have been discussed with the patient and family. After consideration of risks, benefits and other options for treatment, the patient has consented to  Procedure(s) with comments: TOTAL KNEE ARTHROPLASTY (Right) - adductor canal as a surgical intervention.  The patient's history has been reviewed, patient examined, no change in status, stable for surgery.  I have reviewed the patient's chart and labs.  Questions were answered to the patient's satisfaction.     [name]

## 2020-02-27 ENCOUNTER — Encounter (HOSPITAL_COMMUNITY): Payer: Self-pay | Admitting: Specialist

## 2020-02-27 DIAGNOSIS — M1711 Unilateral primary osteoarthritis, right knee: Secondary | ICD-10-CM | POA: Diagnosis not present

## 2020-02-27 MED ORDER — TRAMADOL HCL 50 MG PO TABS: 50.0000 mg | ORAL_TABLET | Freq: Four times a day (QID) | ORAL | 0 refills | Status: AC

## 2020-02-27 NOTE — Progress Notes (Signed)
Subjective: 1 Day Post-Op Procedure(s) (LRB): TOTAL KNEE ARTHROPLASTY (Right) Patient reports pain as mild.  Patient did have some difficulty yesterday with pain management in the PACU.  He is now sitting comfortably in bed states the pain is a 4/10. Positive for voiding and flatus Had some difficulty getting up with therapy yesterday afternoon due to vertigo and pain but has been getting up overnight been able to ambulate to the bathroom Overall doing pretty well.  Objective: Vital signs in last 24 hours: Temp:  [98 F (36.7 C)-99.1 F (37.3 C)] 98.2 F (36.8 C) (02/17 0539) Pulse Rate:  [62-78] 67 (02/17 0539) Resp:  [9-20] 16 (02/17 0539) BP: (127-192)/(70-114) 127/82 (02/17 0539) SpO2:  [95 %-100 %] 98 % (02/17 0539) Weight:  [126.6 kg] 126.6 kg (02/16 1328)  Intake/Output from previous day: 02/16 0701 - 02/17 0700 In: 5150 [P.O.:1440; I.V.:3460; IV Piggyback:250] Out: 2050 [Urine:2000; Blood:50] Intake/Output this shift: Total I/O In: 1490 [P.O.:840; I.V.:600; IV Piggyback:50] Out: 1500 [Urine:1500]  Recent Labs    02/26/20 0730 02/26/20 1131  HGB 14.0 13.8   Recent Labs    02/26/20 0730 02/26/20 1131  WBC 5.3 7.4  RBC 4.38 4.30  HCT 41.9 41.5  PLT 151 151   Recent Labs    02/26/20 0730 02/26/20 1131  NA 137  --   K 3.8  --   CL 103  --   CO2 25  --   BUN 18  --   CREATININE 0.83 0.82  GLUCOSE 111*  --   CALCIUM 8.9  --    Recent Labs    02/26/20 0730  INR 1.0    Neurologically intact Neurovascular intact Sensation intact distally Intact pulses distally Dorsiflexion/Plantar flexion intact Incision: dressing C/D/I Compartment soft   Assessment/Plan: 1 Day Post-Op Procedure(s) (LRB): TOTAL KNEE ARTHROPLASTY (Right) Up with therapy, as long as patient is able to get up with therapy this afternoon he is okay to be discharged home today Medications of already been sent to the pharmacy for the patient He will start aspirin 81 mg twice a day  starting tomorrow morning for DVT prophylaxis Patient is going to be taking a stool softener at home for GI prophylaxis All this was explained to the patient this morning Okay for d/c this afternoon once cleared by PT   Patient's anticipated LOS is less than 2 midnights, meeting these requirements: - Younger than 57 - Lives within 1 hour of care - Has a competent adult at home to recover with post-op recover - NO history of  - Chronic pain requiring opiods  - Diabetes  - Coronary Artery Disease  - Heart failure  - Heart attack  - Stroke  - DVT/VTE  - Cardiac arrhythmia  - Respiratory Failure/COPD  - Renal failure  - Anemia  - Advanced Liver disease       Travis Myers [phone] 02/27/2020, 6:54 AM

## 2020-02-27 NOTE — Progress Notes (Signed)
Physical Therapy Treatment Patient Details Name: Travis Myers MRN: [id number] DOB: 08-May-1955 Today's Date: 02/27/2020    History of Present Illness Patient is a 65 y.o. male s/p Rt TKA on 02/26/20 with PMH significant for HTN, HLD, pre-diabetes, sleep apnea, AAA, meniscal tear Lt knee, R THA (2016), L THA (2011), lumbar laminectomy/decompression (2018).    PT Comments    Patient able to demonstrate mobility safe for d/c home and negotiated steps appropriate for home entry.  Still with significant knee pain despite pre-med, but participated well even performing another "set" of exercises per reports while PT obtained stairs.  Patient stable for home without second session needed today.  Agree with follow up as planned.    Follow Up Recommendations  Follow surgeon's recommendation for DC plan and follow-up therapies;Outpatient PT     Equipment Recommendations  None recommended by PT    Recommendations for Other Services       Precautions / Restrictions Precautions Precautions: Fall;Knee Precaution Booklet Issued: Yes (comment) Precaution Comments: discussed no pillow or support under the bend of the knee when resting Restrictions Other Position/Activity Restrictions: WBAT    Mobility  Bed Mobility   Bed Mobility: Supine to Sit;Sit to Supine     Supine to sit: Supervision Sit to supine: Supervision   General bed mobility comments: HOB slightly elevated    Transfers   Equipment used: Rolling walker (2 wheeled) Transfers: Sit to/from Stand Sit to Stand: Supervision;From elevated surface         General transfer comment: bed up to height of bed at home  Ambulation/Gait Ambulation/Gait assistance: Min guard;Supervision Gait Distance (Feet): 120 Feet Assistive device: Rolling walker (2 wheeled) Gait Pattern/deviations: Step-through pattern;Trunk flexed;Wide base of support;Decreased stance time - right     General Gait Details: self selected sequence but  safe   Stairs Stairs: Yes Stairs assistance: Min guard Stair Management: One rail Right;With cane;Step to pattern;Forwards Number of Stairs: 4 General stair comments: assist to manage walker, cane, cues for sequence; also negotiated 1 step with RW as has one at entry after up to porch; practiced x 3 with RW and CGA cues for sequence   Wheelchair Mobility    Modified Rankin (Stroke Patients Only)       Balance Overall balance assessment: Needs assistance   Sitting balance-Leahy Scale: Good     Standing balance support: During functional activity;No upper extremity supported Standing balance-Leahy Scale: Fair Standing balance comment: standing after toileting without UE support                            Cognition Arousal/Alertness: Awake/alert Behavior During Therapy: WFL for tasks assessed/performed Overall Cognitive Status: Within Functional Limits for tasks assessed                                        Exercises Total Joint Exercises Quad Sets: AROM;10 reps;Right;Supine Short Arc Quad: AROM;10 reps;Right;Supine Heel Slides: AROM;AAROM;10 reps;Right;Supine Hip ABduction/ADduction: AROM;10 reps;Right;Supine Straight Leg Raises: AROM;AAROM;10 reps;Right;Supine Knee Flexion: AROM;5 reps;Right;Seated Goniometric ROM: grossly 8-48 Marching in Standing: AROM    General Comments General comments (skin integrity, edema, etc.): issued handouts on steps and HEP and reviewed with pt.      Pertinent Vitals/Pain Pain Assessment: Faces Faces Pain Scale: Hurts even more Pain Location: Rt knee Pain Descriptors / Indicators: Aching;Discomfort;Guarding;Grimacing Pain Intervention(s): Monitored during  session;Premedicated before session    Home Living                      Prior Function            PT Goals (current goals can now be found in the care plan section) Progress towards PT goals: Progressing toward goals     Frequency    7X/week      PT Plan Current plan remains appropriate    Co-evaluation              AM-PAC PT "6 Clicks" Mobility   Outcome Measure  Help needed turning from your back to your side while in a flat bed without using bedrails?: None Help needed moving from lying on your back to sitting on the side of a flat bed without using bedrails?: None Help needed moving to and from a bed to a chair (including a wheelchair)?: A Little Help needed standing up from a chair using your arms (e.g., wheelchair or bedside chair)?: None Help needed to walk in hospital room?: A Little Help needed climbing 3-5 steps with a railing? : A Little 6 Click Score: 21    End of Session Equipment Utilized During Treatment: Gait belt Activity Tolerance: Patient tolerated treatment well Patient left: in bed;with call bell/phone within reach   PT Visit Diagnosis: Difficulty in walking, not elsewhere classified (R26.2);Pain Pain - Right/Left: Right Pain - part of body: Knee     Time: 1034-1130 PT Time Calculation (min) (ACUTE ONLY): 56 min  Charges:  $Gait Training: 23-37 mins $Therapeutic Exercise: 8-22 mins $Therapeutic Activity: 8-22 mins                     Magda Kiel, PT Acute Rehabilitation Services Pager:[phone] Office:[phone] 02/27/2020    Reginia Naas 02/27/2020, 12:47 PM

## 2020-02-27 NOTE — Discharge Summary (Signed)
Physician Discharge Summary  Patient ID: Travis Myers MRN: [id number] DOB/AGE: 67/10/1955 67 y.o.  Admit date: 02/26/2020 Discharge date: 02/27/2020  Admission Diagnoses: Right knee osteoarthritis Discharge Diagnoses:  Active Problems:   Osteoarthritis of right knee   Discharged Condition: good  Hospital Course: Patient was admitted on February 16 for right end-stage osteoarthritis and was scheduled for right total knee replacement.  Patient tolerated the surgery well.  He was sent to PACU and experienced some difficulty with pain control in PACU.  Once pain control was finally achieved they sent him up to postop floor he did well.  He had difficulty with getting up with physical therapy on the first day due to vertigo and some pain control.  He states overnight he did pretty well he was able to get up and ambulate to the bathroom multiple times without any issues.  He has been able to void positive for flatus.  No events overnight.  Postop day 1 patient was able to get up with physical therapy no issues.  Patient was able to be discharged today.  Pain medication been sent to his pharmacy, muscle relaxer as well as nausea medication.  Patient has been instructed to take stool softeners for GI prophylaxis also been instructed to take aspirin 81 mg twice a day for DVT prophylaxis.  Did it involve swelling of the face/tongue/throat, SOB, or low BP? No Did it involve sudden or severe rash/hives, skin peeling, or any reaction on the inside of your mouth or nose? No Did you need to seek medical attention at a hospital or doctor's office? No When did it last happen?       If all above answers are "NO", may proceed with cephalosporin use. We were able to use Ancef during surgery without any complications.   Consults: None  Significant Diagnostic Studies: none  Treatments: IV hydration, antibiotics: Ancef, analgesia: acetaminophen, Dilaudid and Oxycodone, anticoagulation: Lovenox,  therapies: PT and surgery: Right TKA  Discharge Exam: Blood pressure 127/82, pulse 67, temperature 98.2 F (36.8 C), temperature source Oral, resp. rate 16, height 6' 3.98" (1.93 m), weight 126.6 kg, SpO2 98 %. General appearance: alert, cooperative, appears stated age and no distress Extremities: extremities normal, atraumatic, no cyanosis or edema Pulses: 2+ and symmetric Skin: Skin color, texture, turgor normal. No rashes or lesions Neurologic: Grossly normal Incision/Wound: Dressings clean dry and intact  Disposition: Discharge disposition: 01-Home or Self Care       Discharge Instructions    Call MD / Call 911   Complete by: As directed    If you experience chest pain or shortness of breath, CALL 911 and be transported to the hospital emergency room.  If you develope a fever above 101 F, pus (white drainage) or increased drainage or redness at the wound, or calf pain, call your surgeon's office.   Constipation Prevention   Complete by: As directed    Drink plenty of fluids.  Prune juice may be helpful.  You may use a stool softener, such as Colace (over the counter) 100 mg twice a day.  Use MiraLax (over the counter) for constipation as needed.   Diet - low sodium heart healthy   Complete by: As directed    Discharge instructions   Complete by: As directed    Dr. Sydnee Cabal [facility] [address] [place], [place] [address] [phone]  TOTAL KNEE REPLACEMENT POSTOPERATIVE DIRECTIONS  Knee Rehabilitation, Guidelines Following Surgery  Results after knee surgery are often  greatly improved when you follow the exercise, range of motion and muscle strengthening exercises prescribed by your doctor. Safety measures are also important to protect the knee from further injury. Any time any of these exercises cause you to have increased pain or swelling in your knee joint, decrease the amount until you are comfortable again and slowly increase them. If you have  problems or questions, call your caregiver or physical therapist for advice.   HOME CARE INSTRUCTIONS  Remove items at home which could result in a fall. This includes throw rugs or furniture in walking pathways.  ICE to the affected knee every three hours for 30 minutes at a time and then as needed for pain and swelling.  Continue to use ice on the knee for pain and swelling from surgery. You may notice swelling that will progress down to the foot and ankle.  This is normal after surgery.  Elevate the leg when you are not up walking on it.   Continue to use the breathing machine which will help keep your temperature down.  It is common for your temperature to cycle up and down following surgery, especially at night when you are not up moving around and exerting yourself.  The breathing machine keeps your lungs expanded and your temperature down. Do not place pillow under knee, focus on keeping the knee straight while resting   DIET You may resume your previous home diet once your are discharged from the hospital.  DRESSING / WOUND CARE / SHOWERING Keep the surgical dressing until follow up.  The dressing is water proof, but you need to put extra covering over it like plastic wrap.  IF THE DRESSING FALLS OFF or the wound gets wet inside, change the dressing with sterile gauze.  Please use good hand washing techniques before changing the dressing.  Do not use any lotions or creams on the incision until instructed by your surgeon.   You may start showering once you are discharged home but do not submerge the incision under water.  You are sent home with an ACE bandage on over the leg, this can be removed at 3 days from surgery. At this time you can start showering. Please place the white TED stocking on the surgical leg after. This needs to be worn on the surgical leg for 2 weeks after surgery.   ACTIVITY Walk with your walker as instructed. Use walker as long as suggested by your caregivers. Avoid  periods of inactivity such as sitting longer than an hour when not asleep. This helps prevent blood clots.  You may resume a sexual relationship in one month or when given the OK by your doctor.  You may return to work once you are cleared by your doctor.  Do not drive a car for 6 weeks or until released by you surgeon.  Do not drive while taking narcotics.  WEIGHT BEARING Weight bearing as tolerated with assist device (walker, cane, etc) as directed, use it as long as suggested by your surgeon or therapist, typically at least 4-6 weeks.  POSTOPERATIVE CONSTIPATION PROTOCOL Constipation - defined medically as fewer than three stools per week and severe constipation as less than one stool per week.  One of the most common issues patients have following surgery is constipation.  Even if you have a regular bowel pattern at home, your normal regimen is likely to be disrupted due to multiple reasons following surgery.  Combination of anesthesia, postoperative narcotics, change in appetite and fluid intake  all can affect your bowels.  In order to avoid complications following surgery, here are some recommendations in order to help you during your recovery period.  Colace (docusate) - Pick up an over-the-counter form of Colace or another stool softener and take twice a day as long as you are requiring postoperative pain medications.  Take with a full glass of water daily.  If you experience loose stools or diarrhea, hold the colace until you stool forms back up.  If your symptoms do not get better within 1 week or if they get worse, check with your doctor.  Dulcolax (bisacodyl) - Pick up over-the-counter and take as directed by the product packaging as needed to assist with the movement of your bowels.  Take with a full glass of water.  Use this product as needed if not relieved by Colace only.   MiraLax (polyethylene glycol) - Pick up over-the-counter to have on hand.  MiraLax is a solution that will  increase the amount of water in your bowels to assist with bowel movements.  Take as directed and can mix with a glass of water, juice, soda, coffee, or tea.  Take if you go more than two days without a movement. Do not use MiraLax more than once per day. Call your doctor if you are still constipated or irregular after using this medication for 7 days in a row.  If you continue to have problems with postoperative constipation, please contact the office for further assistance and recommendations.  If you experience "the worst abdominal pain ever" or develop nausea or vomiting, please contact the office immediatly for further recommendations for treatment.  ITCHING  If you experience itching with your medications, try taking only a single pain pill, or even half a pain pill at a time.  You can also use Benadryl over the counter for itching or also to help with sleep.   TED HOSE STOCKINGS Wear the elastic stockings on both legs for two weeks following surgery during the day but you may remove then at night for sleeping.  Okay to remove ACE in 3 days, put TED on after  MEDICATIONS See your medication summary on the "After Visit Summary" that the nursing staff will review with you prior to discharge.  You may have some home medications which will be placed on hold until you complete the course of blood thinner medication.  It is important for you to complete the blood thinner medication as prescribed by your surgeon.  Continue your approved medications as instructed at time of discharge. If you were not previously taking any blood thinners prior to surgery please start taking Aspirin 325 mg tabs twice daily for 6 weeks. If you are unable to take Aspirin please let your doctor know.   PRECAUTIONS If you experience chest pain or shortness of breath - call 911 immediately for transfer to the hospital emergency department.  If you develop a fever greater that 101 F, purulent drainage from wound, increased  redness or drainage from wound, foul odor from the wound/dressing, or calf pain - CONTACT YOUR SURGEON.                                                   FOLLOW-UP APPOINTMENTS Make sure you keep all of your appointments after your operation with your surgeon and caregivers. You should call  the office at the above phone number and make an appointment for approximately two weeks after the date of your surgery or on the date instructed by your surgeon outlined in the "After Visit Summary".   RANGE OF MOTION AND STRENGTHENING EXERCISES  Rehabilitation of the knee is important following a knee injury or an operation. After just a few days of immobilization, the muscles of the thigh which control the knee become weakened and shrink (atrophy). Knee exercises are designed to build up the tone and strength of the thigh muscles and to improve knee motion. Often times heat used for twenty to thirty minutes before working out will loosen up your tissues and help with improving the range of motion but do not use heat for the first two weeks following surgery. These exercises can be done on a training (exercise) mat, on the floor, on a table or on a bed. Use what ever works the best and is most comfortable for you Knee exercises include:  Leg Lifts - While your knee is still immobilized in a splint or cast, you can do straight leg raises. Lift the leg to 60 degrees, hold for 3 sec, and slowly lower the leg. Repeat 10-20 times 2-3 times daily. Perform this exercise against resistance later as your knee gets better.  Quad and Hamstring Sets - Tighten up the muscle on the front of the thigh (Quad) and hold for 5-10 sec. Repeat this 10-20 times hourly. Hamstring sets are done by pushing the foot backward against an object and holding for 5-10 sec. Repeat as with quad sets.  Leg Slides: Lying on your back, slowly slide your foot toward your buttocks, bending your knee up off the floor (only go as far as is comfortable).  Then slowly slide your foot back down until your leg is flat on the floor again. Angel Wings: Lying on your back spread your legs to the side as far apart as you can without causing discomfort.  A rehabilitation program following serious knee injuries can speed recovery and prevent re-injury in the future due to weakened muscles. Contact your doctor or a physical therapist for more information on knee rehabilitation.   IF YOU ARE TRANSFERRED TO A SKILLED REHAB FACILITY If the patient is transferred to a skilled rehab facility following release from the hospital, a list of the current medications will be sent to the facility for the patient to continue.  When discharged from the skilled rehab facility, please have the facility set up the patient's [place] prior to being released. Also, the skilled facility will be responsible for providing the patient with their medications at time of release from the facility to include their pain medication, the muscle relaxants, and their blood thinner medication. If the patient is still at the rehab facility at time of the two week follow up appointment, the skilled rehab facility will also need to assist the patient in arranging follow up appointment in our office and any transportation needs.  MAKE SURE YOU:  Understand these instructions.  Get help right away if you are not doing well or get worse.    Pick up stool softner and laxative for home use following surgery while on pain medications. May shower starting three days after surgery. Please use a clean towel to pat the leg dry following showers. Continue to use ice for pain and swelling after surgery. Do not use any lotions or creams on the incision until instructed by you Start Aspirin immediately  following surgery.   Do not put a pillow under the knee. Place it under the heel.   Complete by: As directed    Driving restrictions   Complete by: As directed    No driving for two  weeks   TED hose   Complete by: As directed    Use stockings (TED hose) for three weeks on both leg(s).  You may remove them at night for sleeping.   Weight bearing as tolerated   Complete by: As directed      Allergies as of 02/27/2020      Reactions   Penicillins Other (See Comments)   Childhood reaction.  Has patient had a PCN reaction causing immediate rash, facial/tongue/throat swelling, SOB or lightheadedness with hypotension:unsure Has patient had a PCN reaction causing severe rash involving mucus membranes or skin necrosis:unsure Has patient had a PCN reaction that required hospitalization:unsure Has patient had a PCN reaction occurring within the last 10 years:No If all of the above answers are "NO", then may proceed with Cephalosporin use.   Quinolones    Patient was warned about not using Cipro and similar antibiotics. Recent studies have raised concern that fluoroquinolone antibiotics could be associated with an increased risk of aortic aneurysm Fluoroquinolones have non-antimicrobial properties that might jeopardise the integrity of the extracellular matrix of the vascular wall In a  propensity score matched cohort study in [place], there was a 66% increased rate of aortic aneurysm or dissection associated with oral fluoroquinolone use, compared wit      Medication List    TAKE these medications   acetaminophen 500 MG tablet Commonly known as: TYLENOL Take 1,000 mg by mouth every 8 (eight) hours as needed for moderate pain.   amLODipine 10 MG tablet Commonly known as: NORVASC Take 10 mg by mouth daily.   aspirin EC 81 MG tablet Take 1 tablet (81 mg total) by mouth in the morning and at bedtime. Swallow whole. What changed: You were already taking a medication with the same name, and this prescription was added. Make sure you understand how and when to take each.   aspirin EC 81 MG tablet Take 1 tablet (81 mg total) by mouth in the morning and at bedtime. Resume 4  days post-op What changed: when to take this   atorvastatin 10 MG tablet Commonly known as: LIPITOR Take 1 tablet by mouth daily.   furosemide 20 MG tablet Commonly known as: LASIX Take 40 mg by mouth daily.   lisinopril 40 MG tablet Commonly known as: ZESTRIL Take 40 mg by mouth daily.   meclizine 25 MG tablet Commonly known as: ANTIVERT Take 25 mg by mouth daily.   methocarbamol 500 MG tablet Commonly known as: Robaxin Take 1 tablet (500 mg total) by mouth 4 (four) times daily.   ondansetron 4 MG tablet Commonly known as: Zofran Take 1 tablet (4 mg total) by mouth daily as needed for nausea or vomiting.   oxyCODONE 5 MG immediate release tablet Commonly known as: Roxicodone Take 1 tablet (5 mg total) by mouth every 4 (four) hours as needed for up to 7 days.   traMADol 50 MG tablet Commonly known as: ULTRAM Take 1 tablet (50 mg total) by mouth every 6 (six) hours for 7 days. What changed:   when to take this  reasons to take this   triamcinolone 0.1 % Commonly known as: KENALOG Apply 1 application topically 2 (two) times daily as needed (dry skin).  Discharge Care Instructions  (From admission, onward)         Start     Ordered   02/27/20 0000  Weight bearing as tolerated        02/27/20 0659           Signed: Nettie Elm [facility] [phone] 02/27/2020, 6:59 AM

## 2020-02-27 NOTE — Plan of Care (Signed)
  Problem: Pain Managment: Goal: General experience of comfort will improve Outcome: Progressing   Problem: Safety: Goal: Ability to remain free from injury will improve Outcome: Progressing   

## 2020-03-02 ENCOUNTER — Ambulatory Visit (HOSPITAL_COMMUNITY)
Admission: RE | Admit: 2020-03-02 | Discharge: 2020-03-02 | Disposition: A | Discharge status: Home / Self Care | Payer: BC Managed Care – PPO | Source: Ambulatory Visit | Attending: Specialist | Admitting: Specialist

## 2020-03-02 ENCOUNTER — Other Ambulatory Visit: Payer: Self-pay

## 2020-03-02 ENCOUNTER — Other Ambulatory Visit (HOSPITAL_COMMUNITY): Payer: Self-pay | Admitting: Specialist

## 2020-03-02 DIAGNOSIS — M79661 Pain in right lower leg: Secondary | ICD-10-CM

## 2020-03-19 ENCOUNTER — Ambulatory Visit: Payer: Self-pay | Admitting: Cardiothoracic Surgery

## 2020-03-19 ENCOUNTER — Other Ambulatory Visit: Payer: Self-pay

## 2020-06-17 ENCOUNTER — Other Ambulatory Visit: Payer: Self-pay

## 2020-06-17 ENCOUNTER — Ambulatory Visit (INDEPENDENT_AMBULATORY_CARE_PROVIDER_SITE_OTHER): Payer: BC Managed Care – PPO | Admitting: Cardiovascular Disease

## 2020-06-17 ENCOUNTER — Encounter: Payer: Self-pay | Admitting: Cardiovascular Disease

## 2020-06-17 DIAGNOSIS — E782 Mixed hyperlipidemia: Secondary | ICD-10-CM

## 2020-06-17 DIAGNOSIS — I351 Nonrheumatic aortic (valve) insufficiency: Secondary | ICD-10-CM

## 2020-06-17 DIAGNOSIS — I712 Thoracic aortic aneurysm, without rupture, unspecified: Secondary | ICD-10-CM

## 2020-06-17 DIAGNOSIS — I10 Essential (primary) hypertension: Secondary | ICD-10-CM | POA: Diagnosis not present

## 2020-06-17 MED ORDER — ATORVASTATIN CALCIUM 20 MG PO TABS: 20.0000 mg | ORAL_TABLET | Freq: Every day | ORAL | 3 refills | Status: DC

## 2020-06-17 NOTE — Assessment & Plan Note (Signed)
History of essential hypertension a blood pressure measured today at 112/60.  He is on amlodipine and Prinivil.

## 2020-06-17 NOTE — Patient Instructions (Signed)
Medication Instructions:  INCREASE atorvastatin to 20mg  daily  *If you need a refill on your cardiac medications before your next appointment, please call your pharmacy*   Lab Work: FASTING lab work in September -- before your CT test  If you have labs (blood work) drawn today and your tests are completely normal, you will receive your results only by: Marland Kitchen MyChart Message (if you have MyChart) OR . A paper copy in the mail If you have any lab test that is abnormal or we need to change your treatment, we will call you to review the results.   Testing/Procedures: CT angiogram chest/aorta in September 2022  Echocardiogram in December 2022   Follow-Up: At [facility], you and your health needs are our priority.  As part of our continuing mission to provide you with exceptional heart care, we have created designated Provider Care Teams.  These Care Teams include your primary Cardiologist (physician) and Advanced Practice Providers (APPs -  Physician Assistants and Nurse Practitioners) who all work together to provide you with the care you need, when you need it.  We recommend signing up for the patient portal called "MyChart".  Sign up information is provided on this After Visit Summary.  MyChart is used to connect with patients for Virtual Visits (Telemedicine).  Patients are able to view lab/test results, encounter notes, upcoming appointments, etc.  Non-urgent messages can be sent to your provider as well.   To learn more about what you can do with MyChart, go to [email or web address].    Your next appointment:   12 month(s)  The format for your next appointment:   In Person  Provider:   Dr. Gwenlyn Found   Other Instructions  Please schedule an appointment with Dr. Cyndia Bent in September 2022 -- after CT test

## 2020-06-17 NOTE — Assessment & Plan Note (Signed)
History of small ascending thoracic aortic aneurysm measuring 45 mm by CTA and 49 by 2D echo followed by Dr. Servando Snare in the past who last saw him in September.  I am referring him to Dr. Cyndia Bent to continue to follow this as an outpatient.  We will recheck a CTA.

## 2020-06-17 NOTE — Assessment & Plan Note (Signed)
History of mild to moderate aortic insufficiency with 2D echo performed 01/06/2020 which continues to show this with normal LV systolic function.  This will be repeated in 12 months.

## 2020-06-17 NOTE — Progress Notes (Signed)
06/17/2020 Travis Myers   25-Nov-1955  [id number]  Primary Physician Cyndi Bender, PA-C Primary Cardiologist: [name] MD Travis Myers, [place]  HPI:  Travis Myers is a 65 y.o.  moderately overweight married Caucasian male with no children who works in the Transport planner at [employer]. He was previously a patient of Dr. Georgiann Mccoy His primary care provider is Cyndi Bender he PA-C at [facility]. I last saw him in the office  03/08/2019. He has a history of treated hypertension and hyperlipidemia. There is no family history. He has never had a heart attack or stroke. He denies chest pain or shortness of breath. He does have a thoracic aortic aneurysm measuring 51 mm by CT angiogram performed in September last year. This is followed on an annual basis. He had a negative Myoview stress test performed in February of this year done for preoperative clearance before a right total hip replacement done via the anterior approach by Dr. Alvan Dame . He is recuperated from this nicely. He had a right meniscal surgery by Dr. Theda Sers as well.  He does have a moderate sized ascending thoracic aortic aneurysm which we have been following by CTA. Recently it measured 49 mm.Dr. Servando Snare is following this as well every 6 months. Is asymptomatic from this. He denies chest pain or shortness of breath.  Recent 2D echo performed 07/03/2018 revealed normal LV systolic function with mild to moderate aortic insufficiency.  Since I saw him a year ago he continues to do well.  He did have his right total knee replacement by Dr. Theda Sers 02/12/2020 and had a prolonged postoperative recovery.  He is involved in PT.  He denies chest pain or shortness of breath.  2D echo performed 01/06/2020 revealed normal LV systolic function with mild to moderate AI and mild AS.  A CT angiogram performed 10/03/2019 revealed his ascending thoracic aorta measured 45 mm.   Current Meds  Medication  Sig  . acetaminophen (TYLENOL) 500 MG tablet Take 1,000 mg by mouth every 8 (eight) hours as needed for moderate pain.  Marland Kitchen amLODipine (NORVASC) 10 MG tablet Take 10 mg by mouth daily.  Marland Kitchen atorvastatin (LIPITOR) 10 MG tablet Take 1 tablet by mouth daily.  . furosemide (LASIX) 20 MG tablet Take 40 mg by mouth daily.  Marland Kitchen lisinopril (PRINIVIL,ZESTRIL) 40 MG tablet Take 40 mg by mouth daily.  . meclizine (ANTIVERT) 25 MG tablet Take 25 mg by mouth daily.  . methocarbamol (ROBAXIN) 500 MG tablet Take 1 tablet (500 mg total) by mouth 4 (four) times daily.  . ondansetron (ZOFRAN) 4 MG tablet Take 1 tablet (4 mg total) by mouth daily as needed for nausea or vomiting.  . triamcinolone (KENALOG) 0.1 % Apply 1 application topically 2 (two) times daily as needed (dry skin).     Allergies  Allergen Reactions  . Penicillins Other (See Comments)    Childhood reaction.  Has patient had a PCN reaction causing immediate rash, facial/tongue/throat swelling, SOB or lightheadedness with hypotension:unsure Has patient had a PCN reaction causing severe rash involving mucus membranes or skin necrosis:unsure Has patient had a PCN reaction that required hospitalization:unsure Has patient had a PCN reaction occurring within the last 10 years:No If all of the above answers are "NO", then may proceed with Cephalosporin use.   . Quinolones     Patient was warned about not using Cipro and similar antibiotics. Recent studies have raised concern that fluoroquinolone antibiotics could  be associated with an increased risk of aortic aneurysm Fluoroquinolones have non-antimicrobial properties that might jeopardise the integrity of the extracellular matrix of the vascular wall In a  propensity score matched cohort study in [place], there was a 66% increased rate of aortic aneurysm or dissection associated with oral fluoroquinolone use, compared wit    Social History   Socioeconomic History  . Marital status: Married    Spouse  name: Not on file  . Number of children: Not on file  . Years of education: Not on file  . Highest education level: Not on file  Occupational History  . Not on file  Tobacco Use  . Smoking status: Never Smoker  . Smokeless tobacco: Former Network engineer  . Vaping Use: Never used  Substance and Sexual Activity  . Alcohol use: Yes    Alcohol/week: 1.0 standard drink    Types: 1 Standard drinks or equivalent per week    Comment: RARE  . Drug use: No  . Sexual activity: Yes  Other Topics Concern  . Not on file  Social History Narrative  . Not on file   Social Determinants of Health   Financial Resource Strain: Not on file  Food Insecurity: Not on file  Transportation Needs: Not on file  Physical Activity: Not on file  Stress: Not on file  Social Connections: Not on file  Intimate Partner Violence: Not on file     Review of Systems: General: negative for chills, fever, night sweats or weight changes.  Cardiovascular: negative for chest pain, dyspnea on exertion, edema, orthopnea, palpitations, paroxysmal nocturnal dyspnea or shortness of breath Dermatological: negative for rash Respiratory: negative for cough or wheezing Urologic: negative for hematuria Abdominal: negative for nausea, vomiting, diarrhea, bright red blood per rectum, melena, or hematemesis Neurologic: negative for visual changes, syncope, or dizziness All other systems reviewed and are otherwise negative except as noted above.    Blood pressure 112/60, pulse 68, height 6\' 4"  (1.93 m), weight (!) 307 lb (139.3 kg).  General appearance: alert and no distress Neck: no adenopathy, no carotid bruit, no JVD, supple, symmetrical, trachea midline and thyroid not enlarged, symmetric, no tenderness/mass/nodules Lungs: clear to auscultation bilaterally Heart: regular rate and rhythm, S1, S2 normal, no murmur, click, rub or gallop Extremities: extremities normal, atraumatic, no cyanosis or edema Pulses: 2+ and  symmetric Skin: Skin color, texture, turgor normal. No rashes or lesions Neurologic: Alert and oriented X 3, normal strength and tone. Normal symmetric reflexes. Normal coordination and gait  EKG sinus rhythm at 69 without ST or T wave changes.  Personally reviewed this EKG.  ASSESSMENT AND PLAN:   Thoracic aortic aneurysm ([place]) History of small ascending thoracic aortic aneurysm measuring 45 mm by CTA and 49 by 2D echo followed by Dr. Servando Snare in the past who last saw him in September.  I am referring him to Dr. Cyndia Bent to continue to follow this as an outpatient.  We will recheck a CTA.  Essential hypertension History of essential hypertension a blood pressure measured today at 112/60.  He is on amlodipine and Prinivil.  Hyperlipidemia History of hyperlipidemia on atorvastatin 10 mg a day.  His lipid profile did reveal an LDL of 128 performed 12/27/2019.  I am going to increase this from 10 to 20 mg a day and we will recheck a lipid liver profile in 2 months.  Aortic regurgitation History of mild to moderate aortic insufficiency with 2D echo performed 01/06/2020 which continues to show this  with normal LV systolic function.  This will be repeated in 12 months.      [name] MD FACP,FACC,FAHA, [facility] 06/17/2020 2:05 PM

## 2020-06-17 NOTE — Assessment & Plan Note (Signed)
History of hyperlipidemia on atorvastatin 10 mg a day.  His lipid profile did reveal an LDL of 128 performed 12/27/2019.  I am going to increase this from 10 to 20 mg a day and we will recheck a lipid liver profile in 2 months.

## 2020-06-19 ENCOUNTER — Other Ambulatory Visit: Payer: Self-pay

## 2020-06-19 ENCOUNTER — Other Ambulatory Visit (HOSPITAL_COMMUNITY): Payer: Self-pay | Admitting: Specialist

## 2020-06-19 ENCOUNTER — Ambulatory Visit (HOSPITAL_COMMUNITY)
Admission: RE | Admit: 2020-06-19 | Discharge: 2020-06-19 | Disposition: A | Discharge status: Home / Self Care | Payer: BC Managed Care – PPO | Source: Ambulatory Visit | Attending: Specialist | Admitting: Specialist

## 2020-06-19 ENCOUNTER — Other Ambulatory Visit (HOSPITAL_COMMUNITY): Payer: Self-pay | Admitting: Ophthalmology

## 2020-06-19 DIAGNOSIS — M79661 Pain in right lower leg: Secondary | ICD-10-CM

## 2020-06-19 DIAGNOSIS — M7989 Other specified soft tissue disorders: Secondary | ICD-10-CM | POA: Diagnosis not present

## 2020-08-10 ENCOUNTER — Other Ambulatory Visit: Payer: Self-pay

## 2020-08-10 DIAGNOSIS — I8393 Asymptomatic varicose veins of bilateral lower extremities: Secondary | ICD-10-CM

## 2020-08-20 ENCOUNTER — Other Ambulatory Visit: Payer: Self-pay

## 2020-08-20 ENCOUNTER — Ambulatory Visit (INDEPENDENT_AMBULATORY_CARE_PROVIDER_SITE_OTHER): Payer: BC Managed Care – PPO | Admitting: Physician Assistant

## 2020-08-20 ENCOUNTER — Ambulatory Visit (HOSPITAL_COMMUNITY)
Admission: RE | Admit: 2020-08-20 | Discharge: 2020-08-20 | Disposition: A | Discharge status: Home / Self Care | Payer: BC Managed Care – PPO | Source: Ambulatory Visit | Attending: Vascular Surgery | Admitting: Vascular Surgery

## 2020-08-20 VITALS — BP 127/76 | HR 80 | Temp 98.2°F | Ht 76.0 in | Wt 302.4 lb

## 2020-08-20 DIAGNOSIS — I8393 Asymptomatic varicose veins of bilateral lower extremities: Secondary | ICD-10-CM

## 2020-08-20 DIAGNOSIS — M7989 Other specified soft tissue disorders: Secondary | ICD-10-CM | POA: Diagnosis not present

## 2020-08-20 NOTE — Progress Notes (Addendum)
VASCULAR [facility]IN SPECIALISTS           OF [place]  History and Physical   Travis Myers is a 65 y.o. male who presents with BLE swelling with right worse than left.  He has never had a blood clot that he knows of.  He does has had multiple right knee surgeries with the most recent in February 2022.  He states that prior to that he had lost weight down to 268lbs but gained it back since he was immobile during his post op time.  He states that he has been back on his diet and has lost 9lbs.   He also recently had right ulnar nerve surgery 2 weeks ago.  This has limited him with wearing compression.  He does have skin changes in both legs.  He has an area mid shin that he developed a staph infection when he lived in [place].  He was elk hunting and fell on a stump and the next day, he had infection.    He states that he has been given lasix in the past to help with the swelling but he really did not see much difference with that.  He states that the swelling may be minimally better in the morning after his legs have been up at night.  He states that he has an area on the medial thigh just above the knee that is bothersome and tight.  He also has a new area of swelling around the medial ankle.   He has worn compression in the past but states that his legs felt worse afterward due to the swelling above the elastic.  He has tried knee and thigh high compression.    He has hx of vein stripping in the left leg in the past when he lived in [place].  He did have a bleeding varicosity at that time.    Pt has hx of ascending aortic aneurysm that is being followed by cardiology and [facility] (Dr. Servando Snare prior to retiring).  Pt works with [employer].   The pt is on a statin for cholesterol management.  The pt is on a daily aspirin.   Other AC:  none The pt is on CCB, ACEI for hypertension.   The pt is not diabetic.   (States he has been told he is pre diabetic) Tobacco  hx:  never   Past Medical History:  Diagnosis Date   Acute meniscal tear of knee left   Aneurysm, ascending aorta ([place]) 4.8cm per ct 2011/ CARDIOLOGSIT--  DR Rollene Fare  LOV  10-07-2011  NOTE W/ CHART AND REQUEST LEXISCAN MYOVIEW AND ECHO TO BE FAXED.   ASYMPTOMATIC   Heart murmur MILD-- ASYMPTOMATIC   Hyperlipidemia    Hypertension    OA (osteoarthritis) of knee left   OSA (obstructive sleep apnea) NON-TOLERANT CPAP   no cpap PT REPORTS NEVER FINISHED TEST    Pre-diabetes    Sleep apnea    Tinnitus of both ears     Past Surgical History:  Procedure Laterality Date   BILATERAL IINGUINAL HERNIA REPAIR  1963   DOPPLER ECHOCARDIOGRAPHY  10/18/2011   EF >55 %   LUMBAR LAMINECTOMY/DECOMPRESSION MICRODISCECTOMY N/A 02/24/2016   Procedure: Bilateral Decompression L4-5, Discectomy L4-5 ;  Surgeon: Susa Day, MD;  Location: [facility];  Service: Orthopedics;  Laterality: N/A;   NM MYOCAR PERF WALL MOTION  10/13/2011   EF 49 %,low risk study  RIGHT KNEE SURGERY X 2      ARTHROSCOPY    TONSILLECTOMY  1965   torn meniscus left knee surgery      TORN MENISCUS X 3 IN KNEES SURGERY      LEFT    TOTAL HIP ARTHROPLASTY  06-18-2009   OA LEFT HIP   TOTAL HIP ARTHROPLASTY Right 03/11/2014   Procedure: RIGHT TOTAL HIP ARTHROPLASTY ANTERIOR APPROACH;  Surgeon: [name], MD;  Location: [facility];  Service: Orthopedics;  Laterality: Right;   TOTAL KNEE ARTHROPLASTY Right 02/26/2020   Procedure: TOTAL KNEE ARTHROPLASTY;  Surgeon: Sydnee Cabal, MD;  Location: [facility];  Service: Orthopedics;  Laterality: Right;  adductor canal   TRANSTHORACIC ECHOCARDIOGRAM  09/11/2012   EF 55 to 60 %,mild aortic valve regurg,mild mitral valve regurg,lf and rt atriums mildly dilated   VARICOSE VEIN SURGERY Left 1980   veins removed from left leg  1994    Social History   Socioeconomic History   Marital status: Married    Spouse name: Not on file   Number of children: Not on file   Years of education: Not on  file   Highest education level: Not on file  Occupational History   Not on file  Tobacco Use   Smoking status: Never   Smokeless tobacco: Former    Quit date: 09/11/1988  Vaping Use   Vaping Use: Never used  Substance and Sexual Activity   Alcohol use: Yes    Alcohol/week: 1.0 standard drink    Types: 1 Standard drinks or equivalent per week    Comment: RARE   Drug use: No   Sexual activity: Yes  Other Topics Concern   Not on file  Social History Narrative   Not on file   Social Determinants of Health   Financial Resource Strain: Not on file  Food Insecurity: Not on file  Transportation Needs: Not on file  Physical Activity: Not on file  Stress: Not on file  Social Connections: Not on file  Intimate Partner Violence: Not on file    Family History  Problem Relation Age of Onset   Diabetes Mother    Cancer Mother    Heart failure Other    Diabetes Other    Cancer Other    COPD Other    Colon cancer Neg Hx    Stomach cancer Neg Hx     Current Outpatient Medications  Medication Sig Dispense Refill   acetaminophen (TYLENOL) 500 MG tablet Take 1,000 mg by mouth every 8 (eight) hours as needed for moderate pain.     amLODipine (NORVASC) 10 MG tablet Take 10 mg by mouth daily.     aspirin EC 81 MG tablet Take 81 mg by mouth daily. Swallow whole.     atorvastatin (LIPITOR) 20 MG tablet Take 1 tablet (20 mg total) by mouth daily. 90 tablet 3   furosemide (LASIX) 20 MG tablet Take 40 mg by mouth daily.  5   lisinopril (PRINIVIL,ZESTRIL) 40 MG tablet Take 40 mg by mouth daily.  5   meclizine (ANTIVERT) 25 MG tablet Take 25 mg by mouth daily.  0   methocarbamol (ROBAXIN) 500 MG tablet Take 1 tablet (500 mg total) by mouth 4 (four) times daily. 40 tablet 0   traMADol (ULTRAM) 50 MG tablet Take 50 mg by mouth every 6 (six) hours as needed. 1-2 tablets TID     triamcinolone (KENALOG) 0.1 % Apply 1 application topically 2 (two) times daily as needed (dry  skin).     ondansetron  (ZOFRAN) 4 MG tablet Take 1 tablet (4 mg total) by mouth daily as needed for nausea or vomiting. 30 tablet 1   No current facility-administered medications for this visit.    Allergies  Allergen Reactions   Penicillins Other (See Comments)    Childhood reaction.  Has patient had a PCN reaction causing immediate rash, facial/tongue/throat swelling, SOB or lightheadedness with hypotension:unsure Has patient had a PCN reaction causing severe rash involving mucus membranes or skin necrosis:unsure Has patient had a PCN reaction that required hospitalization:unsure Has patient had a PCN reaction occurring within the last 10 years:No If all of the above answers are "NO", then may proceed with Cephalosporin use.    Quinolones     Patient was warned about not using Cipro and similar antibiotics. Recent studies have raised concern that fluoroquinolone antibiotics could be associated with an increased risk of aortic aneurysm Fluoroquinolones have non-antimicrobial properties that might jeopardise the integrity of the extracellular matrix of the vascular wall In a  propensity score matched cohort study in [place], there was a 66% increased rate of aortic aneurysm or dissection associated with oral fluoroquinolone use, compared wit    REVIEW OF SYSTEMS:   '[X]'$  denotes positive finding, '[ ]'$  denotes negative finding Cardiac  Comments:  Chest pain or chest pressure:    Shortness of breath upon exertion:    Short of breath when lying flat:    Irregular heart rhythm:        Vascular    Pain in calf, thigh, or hip brought on by ambulation:    Pain in feet at night that wakes you up from your sleep:     Blood clot in your veins:    Leg swelling:  x       Pulmonary    Oxygen at home:    Productive cough:     Wheezing:         Neurologic    Sudden weakness in arms or legs:     Sudden numbness in arms or legs:     Sudden onset of difficulty speaking or slurred speech:    Temporary loss of vision  in one eye:     Problems with dizziness:  x vertigo      Gastrointestinal    Blood in stool:     Vomited blood:         Genitourinary    Burning when urinating:     Blood in urine:        Psychiatric    Major depression:         Hematologic    Bleeding problems:    Problems with blood clotting too easily:        Skin    Rashes or ulcers:        Constitutional    Fever or chills:      PHYSICAL EXAMINATION:  Today's Vitals   08/20/20 1437  BP: 127/76  Pulse: 80  Temp: 98.2 F (36.8 C)  TempSrc: Temporal  SpO2: 97%  Weight: (!) 302 lb 6.4 oz (137.2 kg)  Height: '6\' 4"'$  (1.93 m)  PainSc: 1    Body mass index is 36.81 kg/m.   General:  WDWN in NAD; vital signs documented above Gait: Not observed HENT: WNL, normocephalic Pulmonary: normal non-labored breathing without wheezing Cardiac: regular HR; without carotid bruits Abdomen: obese Skin: without rashes Vascular Exam/Pulses:  Right Left  Radial 2+ (normal) 2+ (normal)  DP +doppler  signal +doppler signal  PT +doppler signal +doppler signal  Peroneal +doppler signal +doppler signal   Extremities:     Neurologic: A&O X 3;  moving all extremities equally Psychiatric:  The pt has Normal affect.   Non-Invasive Vascular Imaging:   Venous duplex on 08/20/2020: Venous Reflux Times  +--------------+---------+------+-----------+------------+----------------+   RIGHT         Reflux NoRefluxReflux TimeDiameter cmsComments                                    Yes                                           +--------------+---------+------+-----------+------------+----------------+  CFV                     yes   >1 second                               +--------------+---------+------+-----------+------------+----------------+  FV mid                  yes   >1 second                               +--------------+---------+------+-----------+------------+----------------+  Popliteal                yes   >1 second                               +--------------+---------+------+-----------+------------+----------------+  GSV at SFJ              yes    >500 ms      0.92                      +--------------+---------+------+-----------+------------+----------------+  GSV prox thigh          yes    >500 ms      0.7                       +--------------+---------+------+-----------+------------+----------------+  GSV mid thigh           yes    >500 ms      0.76                      +--------------+---------+------+-----------+------------+----------------+  GSV dist thigh          yes    >500 ms      0.88                      +--------------+---------+------+-----------+------------+----------------+  GSV at knee             yes    >500 ms      0.74                      +--------------+---------+------+-----------+------------+----------------+  GSV prox calf           yes    >500 ms      0.7                       +--------------+---------+------+-----------+------------+----------------+  SSV Pop Fossa no                            0.29                      +--------------+---------+------+-----------+------------+----------------+  SSV prox calf no                            0.19    chronic  thrombus  +--------------+---------+------+-----------+------------+----------------+  SSV mid calf  no                            0.21                      +--------------+---------+------+-----------+------------+----------------+   Summary:  Right:  - No evidence of deep vein thrombosis seen in the right lower extremity, from the common femoral through the popliteal veins.  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  - Venous reflux is noted in the right common femoral vein.  - Venous reflux is noted in the right sapheno-femoral junction.  - Venous reflux is noted in the right greater saphenous vein in  the thigh.  - Venous reflux is noted in the right greater saphenous vein in the calf.  - Venous reflux is noted in the right femoral vein.  - Venous reflux is noted in the right popliteal vein.    Travis Myers is a 65 y.o. male who presents with: BLE leg swelling with right worse than left.  He has hx of left saphenous vein stripping  -pt does not have palpable pedal pulses due to swelling but + doppler signals are present DP/PT/pero bilaterally.  Will get ABI when he returns in three months  -pt does not have evidence of DVT.  Pt does have venous reflux in the deep and superficial venous system in the right leg.  His saphenous vein measures 0.7cm - 0.92cm -discussed with pt about wearing thigh high 20-30 mmHg compression stockings.  He was measured today for appropriate size.  -discussed the importance of leg elevation and how to elevate properly - pt is advised to elevate their legs and a diagram is given to them to demonstrate to lay flat on their back with knees elevated and slightly bent with their feet higher than her knees, which puts their feet higher than their heart for 15 minutes per day.  If they cannot lay flat, advised to lay as flat as possible.  -pt is advised to continue as much walking as possible and avoid sitting or standing for long periods of time.  -discussed importance of weight loss and exercise and that water aerobics would also be beneficial.  -handout with recommendations given -pt will f/u 3 months with ABI and see MD about consideration for laser ablation.   -of note, given hx of bleeding vein in the past, discussed with him proper way to elevate leg and hold pressure should this ever happen again.   Leontine Locket, [facility] 08/20/2020 3:29 PM  Clinic MD:  Oneida Alar

## 2020-08-21 ENCOUNTER — Other Ambulatory Visit: Payer: Self-pay

## 2020-08-21 DIAGNOSIS — M7989 Other specified soft tissue disorders: Secondary | ICD-10-CM

## 2020-09-05 LAB — LIPID PANEL
Chol/HDL Ratio: 3.1 ratio (ref 0.0–5.0)
Cholesterol, Total: 168 mg/dL (ref 100–199)
HDL: 55 mg/dL (ref >39)
LDL Chol Calc (NIH): 99 mg/dL (ref 0–99)
Triglycerides: 74 mg/dL (ref 0–149)
VLDL Cholesterol Cal: 14 mg/dL (ref 5–40)

## 2020-09-05 LAB — HEPATIC FUNCTION PANEL
ALT: 30 IU/L (ref 0–44)
AST: 22 IU/L (ref 0–40)
Albumin: 4.7 g/dL (ref 3.8–4.8)
Alkaline Phosphatase: 55 IU/L (ref 44–121)
Bilirubin Total: 0.6 mg/dL (ref 0.0–1.2)
Bilirubin, Direct: 0.18 mg/dL (ref 0.00–0.40)
Total Protein: 6.4 g/dL (ref 6.0–8.5)

## 2020-09-05 LAB — COMPREHENSIVE METABOLIC PANEL
ALT: 31 IU/L (ref 0–44)
AST: 22 IU/L (ref 0–40)
Albumin/Globulin Ratio: 3.1 — ABNORMAL HIGH (ref 1.2–2.2)
Albumin: 4.7 g/dL (ref 3.8–4.8)
Alkaline Phosphatase: 58 IU/L (ref 44–121)
BUN/Creatinine Ratio: 20 (ref 10–24)
BUN: 17 mg/dL (ref 8–27)
Bilirubin Total: 0.5 mg/dL (ref 0.0–1.2)
CO2: 24 mmol/L (ref 20–29)
Calcium: 9.6 mg/dL (ref 8.6–10.2)
Chloride: 101 mmol/L (ref 96–106)
Creatinine, Ser: 0.85 mg/dL (ref 0.76–1.27)
Globulin, Total: 1.5 g/dL (ref 1.5–4.5)
Glucose: 104 mg/dL — ABNORMAL HIGH (ref 65–99)
Potassium: 4.7 mmol/L (ref 3.5–5.2)
Sodium: 141 mmol/L (ref 134–144)
Total Protein: 6.2 g/dL (ref 6.0–8.5)
eGFR: 95 mL/min/{1.73_m2} (ref >59)

## 2020-09-11 ENCOUNTER — Other Ambulatory Visit: Payer: Self-pay

## 2020-09-11 DIAGNOSIS — E782 Mixed hyperlipidemia: Secondary | ICD-10-CM

## 2020-09-11 MED ORDER — ATORVASTATIN CALCIUM 40 MG PO TABS: 40.0000 mg | ORAL_TABLET | Freq: Every day | ORAL | 3 refills | Status: DC

## 2020-09-17 ENCOUNTER — Ambulatory Visit
Admission: RE | Admit: 2020-09-17 | Discharge: 2020-09-17 | Disposition: A | Discharge status: Home / Self Care | Payer: BC Managed Care – PPO | Source: Ambulatory Visit | Attending: Cardiovascular Disease | Admitting: Cardiovascular Disease

## 2020-09-17 ENCOUNTER — Other Ambulatory Visit: Payer: Self-pay

## 2020-09-17 DIAGNOSIS — I351 Nonrheumatic aortic (valve) insufficiency: Secondary | ICD-10-CM

## 2020-09-17 DIAGNOSIS — I712 Thoracic aortic aneurysm, without rupture, unspecified: Secondary | ICD-10-CM

## 2020-09-17 MED ORDER — IOPAMIDOL (ISOVUE-370) INJECTION 76%
75.0000 mL | Freq: Once | INTRAVENOUS | Status: AC | PRN
  Administered 2020-09-17: 75 mL via INTRAVENOUS

## 2020-09-29 DIAGNOSIS — M7989 Other specified soft tissue disorders: Secondary | ICD-10-CM

## 2020-10-21 ENCOUNTER — Telehealth: Payer: Self-pay

## 2020-10-21 NOTE — Telephone Encounter (Signed)
   [facility] Pre-operative Risk Assessment    Patient Name: Travis Myers  DOB: 1955-05-13 MRN: [id number]  HEARTCARE STAFF:  - IMPORTANT!!!!!! Under Visit Info/Reason for Call, type in Other and utilize the format Clearance MM/DD/YY or Clearance TBD. Do not use dashes or single digits. - Please review there is not already an duplicate clearance open for this procedure. - If request is for dental extraction, please clarify the # of teeth to be extracted. - If the patient is currently at the dentist's office, call Pre-Op Callback Staff (MA/nurse) to input urgent request.  - If the patient is not currently in the dentist office, please route to the Pre-Op pool.  Request for surgical clearance:  What type of surgery is being performed? Left shoulder arthroscopic SAD/DCR/labral  When is this surgery scheduled? TBD  What type of clearance is required (medical clearance vs. Pharmacy clearance to hold med vs. Both)? Pharmacy  Are there any medications that need to be held prior to surgery and how long? Aspirin  Practice name and name of physician performing surgery? [facility]  What is the office phone number? [phone]    7.   What is the office fax number? [phone]  8.   Anesthesia type (None, local, MAC, general) ? General  **Please give instructions on stopping Aspirin prior to surgery.**  [place] 10/21/2020, 11:53 AM  _________________________________________________________________   (provider comments below)

## 2020-10-22 NOTE — Telephone Encounter (Signed)
Travis Myers is requesting left shoulder arthroscopic SCD/DCR/labral surgery.  He was last seen in the clinic on 06/17/2020.  He continued to look well at that time.  He is followed by Dr. Cyndia Bent for his thoracic aortic aneurysm.  His PMH includes thoracic aortic aneurysm, essential hypertension, hyperlipidemia, aortic regurgitation.  His echocardiogram 01/06/2020 showed an LVEF of 60-65%, mild LVH, normal diastolic parameters, trivial mitral valve regurgitation, and mild-moderate aortic valve regurgitation with mild aortic valve stenosis.  He was noted to have aortic dilation measuring 49 mm.  Recommendation was for follow-up in 1 year.   May his aspirin be held prior to his shoulder surgery?  Thank you for your help.  Please direct your response to CV DIV preop pool.  [name] [name] NP-C    10/22/2020, 2:10 PM [address] Office [phone] Fax [phone]

## 2020-10-22 NOTE — Telephone Encounter (Signed)
   Primary Cardiologist: Quay Burow, MD  Chart reviewed as part of pre-operative protocol coverage. Given past medical history and time since last visit, based on ACC/AHA guidelines, Travis Myers would be at acceptable risk for the planned procedure without further cardiovascular testing.   His aspirin may be held for 5-7 days prior to his surgery.  Please resume as soon as hemostasis is achieved.  Patient was advised that if he develops new symptoms prior to surgery to contact our office to arrange a follow-up appointment.  He verbalized understanding.  I will route this recommendation to the requesting party via Epic fax function and remove from pre-op pool.  Please call with questions.  [name] Exa Bomba NP-C    10/22/2020, 3:41 PM [facility] [place] [address] Office [phone] Fax [phone]

## 2020-11-25 ENCOUNTER — Ambulatory Visit (INDEPENDENT_AMBULATORY_CARE_PROVIDER_SITE_OTHER): Payer: BC Managed Care – PPO | Admitting: Vascular Surgery

## 2020-11-25 ENCOUNTER — Other Ambulatory Visit: Payer: Self-pay

## 2020-11-25 ENCOUNTER — Encounter: Payer: Self-pay | Admitting: Vascular Surgery

## 2020-11-25 ENCOUNTER — Ambulatory Visit (HOSPITAL_COMMUNITY)
Admission: RE | Admit: 2020-11-25 | Discharge: 2020-11-25 | Disposition: A | Discharge status: Home / Self Care | Payer: BC Managed Care – PPO | Source: Ambulatory Visit | Attending: Vascular Surgery | Admitting: Vascular Surgery

## 2020-11-25 VITALS — BP 118/69 | HR 81 | Temp 97.3°F | Resp 18 | Ht 75.0 in | Wt 320.5 lb

## 2020-11-25 DIAGNOSIS — I890 Lymphedema, not elsewhere classified: Secondary | ICD-10-CM | POA: Diagnosis not present

## 2020-11-25 DIAGNOSIS — M7989 Other specified soft tissue disorders: Secondary | ICD-10-CM | POA: Diagnosis not present

## 2020-11-25 DIAGNOSIS — I872 Venous insufficiency (chronic) (peripheral): Secondary | ICD-10-CM | POA: Diagnosis not present

## 2020-11-25 NOTE — Progress Notes (Signed)
REASON FOR VISIT:   Follow-up of chronic venous insufficiency  MEDICAL ISSUES:   CHRONIC VENOUS INSUFFICIENCY/LYMPHEDEMA: This patient has combined chronic venous insufficiency and lymphedema.  He has CEAP C4b venous disease.  We have had a long discussion about the importance of leg elevation and the proper positioning for this.  He has a bed which I think can adequately position him most effective position to augment venous and lymphatic drainage.  I have encouraged him to continue to wear his thigh-high compression stockings as these do help with his symptoms significantly.  We have also discussed importance of exercise specifically walking and water aerobics.  I have encouraged him to avoid prolonged sitting and standing.  We have discussed the importance of maintaining a healthy weight as abdominal obesity especially increases lower extremity venous pressure.  Prior to his knee surgery he lost 50 pounds and certainly seems highly motivated.  Based on his venous duplex and my SonoSite exam I think he would be a good candidate for laser ablation of the left great saphenous vein.  We could likely address this to the distal thigh or proximal calf although the vein does become more superficial from the mid thigh down.  He would be at slightly higher risk for a palpable cord or inflammation of the skin where the vein becomes more superficial.  I have discussed the indications for endovenous laser ablation of the right GSV, that is to lower the pressure in the veins and potentially help relieve the symptoms from venous hypertension.  I have also discussed alternative options such as conservative treatment as described above. I have discussed the potential complications of the procedure, including bleeding, bruising, leg swelling, deep venous thrombosis (<1% risk), or failure of the vein to close <1% risk).  I have also explained that venous insufficiency is a chronic disease, and that the patient is at  risk for recurrent varicose veins in the future.  All of the patient's questions were encouraged and answered. They are agreeable to proceed.   Once we have treated his superficial venous disease I think he would be a candidate for a half leg bilateral BioTAB pneumatic compression device to help with his bilateral lower extremity swelling which I think is from both chronic venous insufficiency and lymphedema.  Although we can treat his superficial venous disease he does have significant deep venous reflux in the common femoral, femoral, and popliteal veins.  He has a hospital bed which will allow him to adequately position his legs to maximize venous lymphatic drainage using a half leg compression device.  I think a full leg device will keep the knee straight which would result in suboptimal drainage.      HPI:   Travis Myers is a pleasant 65 y.o. male who was seen by the physicians assistant on 08/20/2020 with leg swelling.  He comes in for 75-month follow-up visit.  The patient has had a long history of bilateral lower extremity swelling which has been more significant on the right side.  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  He does describe significant aching pain and heaviness in the legs which is aggravated by standing and sitting and relieved with elevation.  He states that the thigh-high compression stocking helps his symptoms significantly however his symptoms quickly returned when he takes the stockings off.  He did have a bleeding episode from a varicose vein in the left leg in the 1990s  and had apparently vein stripping at that time in the left leg.  He has been wearing his compression stockings which help and also has been elevating his legs which help.  I do not get any history of claudication, rest pain, or nonhealing ulcers.   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   ASYMPTOMATIC   Heart murmur MILD-- ASYMPTOMATIC   Hyperlipidemia    Hypertension    OA (osteoarthritis) of knee left   OSA (obstructive sleep apnea) NON-TOLERANT CPAP   no cpap PT REPORTS NEVER FINISHED TEST    Pre-diabetes    Sleep apnea    Tinnitus of both ears     Family History  Problem Relation Age of Onset   Diabetes Mother    Cancer Mother    Heart failure Other    Diabetes Other    Cancer Other    COPD Other    Colon cancer Neg Hx    Stomach cancer Neg Hx     SOCIAL HISTORY: Social History   Tobacco Use   Smoking status: Never   Smokeless tobacco: Former    Quit date: 09/11/1988  Substance Use Topics   Alcohol use: Yes    Alcohol/week: 1.0 standard drink    Types: 1 Standard drinks or equivalent per week    Comment: RARE    Allergies  Allergen Reactions   Penicillins Other (See Comments)    Childhood reaction.  Has patient had a PCN reaction causing immediate rash, facial/tongue/throat swelling, SOB or lightheadedness with hypotension:unsure Has patient had a PCN reaction causing severe rash involving mucus membranes or skin necrosis:unsure Has patient had a PCN reaction that required hospitalization:unsure Has patient had a PCN reaction occurring within the last 10 years:No If all of the above answers are "NO", then may proceed with Cephalosporin use.    Quinolones     Patient was warned about not using Cipro and similar antibiotics. Recent studies have raised concern that fluoroquinolone antibiotics could be associated with an increased risk of aortic aneurysm Fluoroquinolones have non-antimicrobial properties that might jeopardise the integrity of the extracellular matrix of the vascular wall In a  propensity score matched cohort study in [place], there was a 66% increased rate of aortic aneurysm or dissection  associated with oral fluoroquinolone use, compared wit    Current Outpatient Medications  Medication Sig Dispense Refill   acetaminophen (TYLENOL) 500 MG tablet Take 1,000 mg by mouth every 8 (eight) hours as needed for moderate pain.     amLODipine (NORVASC) 10 MG tablet Take 10 mg by mouth daily.     aspirin EC 81 MG tablet Take 81 mg by mouth daily. Swallow whole.     atorvastatin (LIPITOR) 40 MG tablet Take 1 tablet (40 mg total) by mouth daily. 90 tablet 3   furosemide (LASIX) 20 MG tablet Take 40 mg by mouth daily.  5   lisinopril (PRINIVIL,ZESTRIL) 40 MG tablet Take 40 mg by mouth daily.  5   meclizine (ANTIVERT) 25 MG tablet Take 25 mg by mouth daily.  0   traMADol (ULTRAM) 50 MG tablet Take 50 mg by mouth every 6 (six) hours as needed. 1-2 tablets TID     triamcinolone (KENALOG) 0.1 % Apply 1 application topically 2 (two) times daily as  needed (dry skin).     methocarbamol (ROBAXIN) 500 MG tablet Take 1 tablet (500 mg total) by mouth 4 (four) times daily. 40 tablet 0   ondansetron (ZOFRAN) 4 MG tablet Take 1 tablet (4 mg total) by mouth daily as needed for nausea or vomiting. 30 tablet 1   No current facility-administered medications for this visit.    REVIEW OF SYSTEMS:  [X]  denotes positive finding, [ ]  denotes negative finding Cardiac  Comments:  Chest pain or chest pressure:    Shortness of breath upon exertion:    Short of breath when lying flat:    Irregular heart rhythm:        Vascular    Pain in calf, thigh, or hip brought on by ambulation:    Pain in feet at night that wakes you up from your sleep:     Blood clot in your veins:    Leg swelling:  x       Pulmonary    Oxygen at home:    Productive cough:     Wheezing:         Neurologic    Sudden weakness in arms or legs:     Sudden numbness in arms or legs:     Sudden onset of difficulty speaking or slurred speech:    Temporary loss of vision in one eye:     Problems with dizziness:          Gastrointestinal    Blood in stool:     Vomited blood:         Genitourinary    Burning when urinating:     Blood in urine:        Psychiatric    Major depression:         Hematologic    Bleeding problems:    Problems with blood clotting too easily:        Skin    Rashes or ulcers:        Constitutional    Fever or chills:     PHYSICAL EXAM:   Vitals:   11/25/20 1326  BP: 118/69  Pulse: 81  Resp: 18  Temp: (!) 97.3 F (36.3 C)  TempSrc: Temporal  SpO2: 96%  Weight: (!) 320 lb 8 oz (145.4 kg)  Height: 6\' 3"  (1.905 m)    GENERAL: The patient is a well-nourished male, in no acute distress. The vital signs are documented above. CARDIAC: There is a regular rate and rhythm.  VASCULAR: I do not detect carotid bruits. Because of his leg swelling I cannot palpate pedal pulses. He has hyperpigmentation bilaterally but more significant on the right side as documented in the photograph below.  He also has some lipodermatosclerosis.  I did look at his right great saphenous vein myself with the SonoSite and he has reflux down to the proximal calf.  The vein is significantly dilated throughout.  The vein becomes more superficial in the mid thigh.  PULMONARY: There is good air exchange bilaterally without wheezing or rales. ABDOMEN: Soft and non-tender with normal pitched bowel sounds.  MUSCULOSKELETAL: There are no major deformities or cyanosis. NEUROLOGIC: No focal weakness or paresthesias are detected. SKIN: There are no ulcers or rashes noted. PSYCHIATRIC: The patient has a normal affect.  DATA:    ARTERIAL DOPPLER STUDY: I have independently interpreted the patient's arterial Doppler study today.  On the right side there is a triphasic dorsalis pedis and posterior tibial signal.  ABIs 100%.  Toe pressure  61 mmHg.  On the left side there is a triphasic dorsalis pedis and posterior tibial signal.  ABIs 100%.  Toe pressures 133 mmHg.  VENOUS DUPLEX: I have reviewed the  venous duplex scan that was done on 08/20/2020.  This was of the right lower extremity only.  There was no evidence of DVT.  There was deep venous reflux in the common femoral vein, femoral vein, and popliteal vein.  There was some chronic thrombus in the right small saphenous vein.  There was superficial reflux in the right great saphenous vein from the saphenofemoral junction to the proximal calf.  Diameters of the vein ranged from 7 to 9 mm.     Deitra Mayo [facility] [phone]

## 2020-11-26 ENCOUNTER — Other Ambulatory Visit: Payer: Self-pay | Admitting: *Deleted

## 2020-11-26 DIAGNOSIS — I83811 Varicose veins of right lower extremities with pain: Secondary | ICD-10-CM

## 2020-12-09 ENCOUNTER — Telehealth: Payer: Self-pay

## 2020-12-09 NOTE — Telephone Encounter (Signed)
Patient called with questions about wearing a Lymphadema pump and if it was truly necessary.  After consultation with Dr. Scot Dock, I relayed the message about the benefits the patient can get from wearing the pump at least 2 hrs per day.  Patient verbalized understanding and will go ahead with plans to use the pump as ordered after his Laser procedure on 12/30/20.

## 2020-12-23 ENCOUNTER — Other Ambulatory Visit: Payer: Self-pay | Admitting: Vascular Surgery

## 2020-12-23 MED ORDER — LORAZEPAM 1 MG PO TABS: 1.0000 mg | ORAL_TABLET | ORAL | 0 refills | Status: DC | PRN

## 2020-12-23 NOTE — Progress Notes (Signed)
I have sent in a prescription for Ativan for him to take prior to his venous procedure.

## 2020-12-30 ENCOUNTER — Encounter: Payer: Self-pay | Admitting: Vascular Surgery

## 2020-12-30 ENCOUNTER — Other Ambulatory Visit: Payer: Self-pay

## 2020-12-30 ENCOUNTER — Ambulatory Visit (INDEPENDENT_AMBULATORY_CARE_PROVIDER_SITE_OTHER): Payer: BC Managed Care – PPO | Admitting: Vascular Surgery

## 2020-12-30 VITALS — BP 144/70 | HR 82 | Temp 98.2°F | Resp 18 | Ht 76.0 in | Wt 304.0 lb

## 2020-12-30 DIAGNOSIS — I890 Lymphedema, not elsewhere classified: Secondary | ICD-10-CM | POA: Diagnosis not present

## 2020-12-30 DIAGNOSIS — I872 Venous insufficiency (chronic) (peripheral): Secondary | ICD-10-CM

## 2020-12-30 HISTORY — PX: ENDOVENOUS ABLATION SAPHENOUS VEIN W/ LASER: SUR449

## 2020-12-30 NOTE — Progress Notes (Signed)
° ° °   Laser Ablation Procedure    Date: 12/30/2020   Travis Myers DOB:14-Jan-1955  Consent signed: Yes      Surgeon: Gae Gallop MD   Procedure: Laser Ablation: right Greater Saphenous Vein  BP (!) 144/70 (BP Location: Left Arm, Patient Position: Sitting, Cuff Size: Large)    Pulse 82    Temp 98.2 F (36.8 C) (Temporal)    Resp 18    Ht 6\' 4"  (1.93 m)    Wt (!) 304 lb (137.9 kg)    SpO2 97%    BMI 37.00 kg/m   Tumescent Anesthesia: 475 cc 0.9% NaCl with 50 cc Lidocaine HCL 1%  and 15 cc 8.4% NaHCO3  Local Anesthesia: 12 cc Lidocaine HCL and NaHCO3 (ratio 2:1)  7 watts continuous mode     Total energy: 2166 Joules    Total time: 309 Seconds  Treatment Length 46 cm   Laser Fiber Ref. # [id number]    Lot #  [id number]    Patient tolerated procedure well  Notes: Patient wore face mask.  All staff members wore facial masks and facial shields/goggles.    Description of Procedure:  After marking the course of the secondary varicosities, the patient was placed on the operating table in the supine position, and the right leg was prepped and draped in sterile fashion.   Local anesthetic was administered and under ultrasound guidance the saphenous vein was accessed with a micro needle and guide wire; then the mirco puncture sheath was placed.  A guide wire was inserted saphenofemoral junction , followed by a 5 french sheath.  The position of the sheath and then the laser fiber below the junction was confirmed using the ultrasound.  Tumescent anesthesia was administered along the course of the saphenous vein using ultrasound guidance. The patient was placed in Trendelenburg position and protective laser glasses were placed on patient and staff, and the laser was fired at 7 watts continuous mode for a total of 2166 joules.       Steri strips were applied to the stab wounds and ABD pads and thigh high compression stockings were applied.  Ace wrap bandages were applied over the  phlebectomy sites and at the top of the saphenofemoral junction. Blood loss was less than 15 cc.  Discharge instructions reviewed with patient and hardcopy of discharge instructions given to patient to take home. The patient ambulated out of the operating room having tolerated the procedure well.

## 2020-12-30 NOTE — Progress Notes (Signed)
Patient name: Travis Myers MRN: [id number] DOB: 06-Apr-1955 Sex: male  REASON FOR VISIT: For laser ablation of the right great saphenous vein  HPI: Travis Myers is a 65 y.o. male who presented with combined chronic venous insufficiency and lymphedema.  He had CEAP C4b venous disease.  He failed conservative treatment and was felt to be a candidate for laser ablation of the right great saphenous vein he presents today for the procedure.  Current Outpatient Medications  Medication Sig Dispense Refill   acetaminophen (TYLENOL) 500 MG tablet Take 1,000 mg by mouth every 8 (eight) hours as needed for moderate pain.     amLODipine (NORVASC) 10 MG tablet Take 10 mg by mouth daily.     aspirin EC 81 MG tablet Take 81 mg by mouth daily. Swallow whole.     furosemide (LASIX) 20 MG tablet Take 40 mg by mouth daily.  5   lisinopril (PRINIVIL,ZESTRIL) 40 MG tablet Take 40 mg by mouth daily.  5   LORazepam (ATIVAN) 1 MG tablet Take 1 tablet (1 mg total) by mouth every 4 (four) hours as needed for up to 3 doses for anxiety. Take 2 tablets 30 minutes before leaving house on the day of the procedure.  Bring third tablet with you to the office. 1 tablet 0   meclizine (ANTIVERT) 25 MG tablet Take 25 mg by mouth daily.  0   traMADol (ULTRAM) 50 MG tablet Take 50 mg by mouth every 6 (six) hours as needed. 1-2 tablets TID     triamcinolone (KENALOG) 0.1 % Apply 1 application topically 2 (two) times daily as needed (dry skin).     atorvastatin (LIPITOR) 40 MG tablet Take 1 tablet (40 mg total) by mouth daily. 90 tablet 3   methocarbamol (ROBAXIN) 500 MG tablet Take 1 tablet (500 mg total) by mouth 4 (four) times daily. 40 tablet 0   ondansetron (ZOFRAN) 4 MG tablet Take 1 tablet (4 mg total) by mouth daily as needed for nausea or vomiting. 30 tablet 1   No current facility-administered medications for this visit.    PHYSICAL EXAM: Vitals:   12/30/20 1100  BP: (!) 144/70  Pulse: 82  Resp:  18  Temp: 98.2 F (36.8 C)  TempSrc: Temporal  SpO2: 97%  Weight: (!) 304 lb (137.9 kg)  Height: 6\' 4"  (1.93 m)    PROCEDURE: Laser ablation right great saphenous vein  TECHNIQUE: The patient was brought to the exam room and placed supine.  I looked at the right great saphenous vein myself with the SonoSite and felt that I could cannulate this in the proximal calf.  The right leg was prepped and draped in usual sterile fashion.  Under ultrasound guidance, after the skin was anesthetized, the great saphenous vein was cannulated with a micropuncture needle and a micropuncture sheath introduced over wire.  And then advanced the J-wire to just below the saphenofemoral junction.  A 65 cm sheath was advanced over the wire and the wire and dilator were removed.  I then positioned the laser fiber at the end of the sheath.  The sheath was retracted.  I position the laser fiber 2.5 cm distal to the saphenofemoral junction.  Tumescent anesthesia was administered circumferentially around the vein.  In the mid thigh the vein became more superficial so I did used extra tumescent and this segment of vein down to the proximal calf.  Next the patient was placed in Trendelenburg.  Laser ablation was performed  of the right great saphenous vein from 2-1/2 cm distal to the saphenofemoral junction to the proximal calf.  50 J/cm was used at 46 W.  Patient tolerated procedure well.  He will return in 1 week for a follow-up duplex.  Deitra Mayo [facility] [place] [phone]

## 2020-12-30 NOTE — Progress Notes (Signed)
° °  Patient name: Travis Myers MRN: [id number] DOB: 04/14/1955 Sex: male  [phone]

## 2021-01-06 ENCOUNTER — Other Ambulatory Visit: Payer: Self-pay

## 2021-01-06 ENCOUNTER — Ambulatory Visit (HOSPITAL_COMMUNITY): Payer: BC Managed Care – PPO | Attending: Cardiology

## 2021-01-06 DIAGNOSIS — I712 Thoracic aortic aneurysm, without rupture, unspecified: Secondary | ICD-10-CM | POA: Diagnosis present

## 2021-01-06 DIAGNOSIS — I351 Nonrheumatic aortic (valve) insufficiency: Secondary | ICD-10-CM | POA: Diagnosis present

## 2021-01-06 DIAGNOSIS — I350 Nonrheumatic aortic (valve) stenosis: Secondary | ICD-10-CM

## 2021-01-06 LAB — ECHOCARDIOGRAM COMPLETE
AR max vel: 2.57 cm2
AV Area VTI: 2.37 cm2
AV Area mean vel: 2.34 cm2
AV Mean grad: 12 mmHg
AV Peak grad: 21 mmHg
Ao pk vel: 2.29 m/s
Area-P 1/2: 3.21 cm2
S' Lateral: 3.5 cm

## 2021-01-07 ENCOUNTER — Ambulatory Visit (HOSPITAL_COMMUNITY)
Admission: RE | Admit: 2021-01-07 | Discharge: 2021-01-07 | Disposition: A | Discharge status: Home / Self Care | Payer: BC Managed Care – PPO | Source: Ambulatory Visit | Attending: Vascular Surgery | Admitting: Vascular Surgery

## 2021-01-07 DIAGNOSIS — I8393 Asymptomatic varicose veins of bilateral lower extremities: Secondary | ICD-10-CM

## 2021-01-07 DIAGNOSIS — I83811 Varicose veins of right lower extremities with pain: Secondary | ICD-10-CM | POA: Insufficient documentation

## 2021-01-13 ENCOUNTER — Other Ambulatory Visit: Payer: Self-pay

## 2021-01-13 ENCOUNTER — Encounter (HOSPITAL_COMMUNITY): Payer: BC Managed Care – PPO

## 2021-01-13 ENCOUNTER — Ambulatory Visit (INDEPENDENT_AMBULATORY_CARE_PROVIDER_SITE_OTHER): Payer: BC Managed Care – PPO | Admitting: Vascular Surgery

## 2021-01-13 ENCOUNTER — Encounter: Payer: Self-pay | Admitting: Vascular Surgery

## 2021-01-13 VITALS — BP 119/72 | HR 84 | Temp 98.7°F | Resp 18 | Ht 76.0 in | Wt 304.0 lb

## 2021-01-13 DIAGNOSIS — I872 Venous insufficiency (chronic) (peripheral): Secondary | ICD-10-CM

## 2021-01-13 DIAGNOSIS — I890 Lymphedema, not elsewhere classified: Secondary | ICD-10-CM

## 2021-01-13 DIAGNOSIS — I83811 Varicose veins of right lower extremities with pain: Secondary | ICD-10-CM | POA: Diagnosis not present

## 2021-01-13 NOTE — Progress Notes (Signed)
Patient name: Travis Myers MRN: [id number] DOB: 02-Apr-1955 Sex: male  REASON FOR VISIT: Follow-up after laser ablation of the right great saphenous vein  HPI: Travis Myers is a 66 y.o. male who would presented with C4b disease.  He had failed conservative treatment and was felt to be a good candidate for laser ablation of the right great saphenous vein.  On 12/30/2020 he underwent laser ablation of the right great saphenous vein from 2-1/2 cm distal to the saphenofemoral junction to the proximal calf.  Of note, the vein became superficial from the mid thigh down.  He comes in for 2-week follow-up visit.  He states that his leg felt better almost instantly after the procedure and that his swelling in the right leg has diminished significantly.  He denies chest pain or shortness of breath.  He has been wearing his thigh-high stocking.  Current Outpatient Medications  Medication Sig Dispense Refill   acetaminophen (TYLENOL) 500 MG tablet Take 1,000 mg by mouth every 8 (eight) hours as needed for moderate pain.     amLODipine (NORVASC) 10 MG tablet Take 10 mg by mouth daily.     aspirin EC 81 MG tablet Take 81 mg by mouth daily. Swallow whole.     furosemide (LASIX) 20 MG tablet Take 40 mg by mouth daily.  5   lisinopril (PRINIVIL,ZESTRIL) 40 MG tablet Take 40 mg by mouth daily.  5   meclizine (ANTIVERT) 25 MG tablet Take 25 mg by mouth daily.  0   traMADol (ULTRAM) 50 MG tablet Take 50 mg by mouth every 6 (six) hours as needed. 1-2 tablets TID     triamcinolone (KENALOG) 0.1 % Apply 1 application topically 2 (two) times daily as needed (dry skin).     atorvastatin (LIPITOR) 40 MG tablet Take 1 tablet (40 mg total) by mouth daily. 90 tablet 3   LORazepam (ATIVAN) 1 MG tablet Take 1 tablet (1 mg total) by mouth every 4 (four) hours as needed for up to 3 doses for anxiety. Take 2 tablets 30 minutes before leaving house on the day of the procedure.  Bring third tablet with you to  the office. 1 tablet 0   methocarbamol (ROBAXIN) 500 MG tablet Take 1 tablet (500 mg total) by mouth 4 (four) times daily. 40 tablet 0   ondansetron (ZOFRAN) 4 MG tablet Take 1 tablet (4 mg total) by mouth daily as needed for nausea or vomiting. 30 tablet 1   No current facility-administered medications for this visit.   REVIEW OF SYSTEMS: Valu.Nieves ] denotes positive finding; [  ] denotes negative finding  CARDIOVASCULAR:  [ ]  chest pain   [ ]  dyspnea on exertion  [ ]  leg swelling  CONSTITUTIONAL:  [ ]  fever   [ ]  chills  PHYSICAL EXAM: Vitals:   01/13/21 1053  BP: 119/72  Pulse: 84  Resp: 18  Temp: 98.7 F (37.1 C)  TempSrc: Temporal  SpO2: 97%  Weight: (!) 304 lb (137.9 kg)  Height: 6\' 4"  (1.93 m)   GENERAL: The patient is a well-nourished male, in no acute distress. The vital signs are documented above. CARDIOVASCULAR: There is a regular rate and rhythm. PULMONARY: There is good air exchange bilaterally without wheezing or rales. VASCULAR: The swelling in the right leg has improved.  He has no significant bruising in the thigh.  DATA:  VENOUS DUPLEX: I have reviewed his venous duplex scan that was done last week.  There is no  evidence of DVT in the right lower extremity.  The great saphenous vein is closed from the mid thigh to the proximal calf.  The proximal vein in the thigh does compress.   MEDICAL ISSUES:  S/P LASER ABLATION RIGHT GREAT SAPHENOUS VEIN: Patient is doing well status post laser ablation of the right great saphenous vein.  His leg feels much better and the swelling has improved.  The proximal segment of vein did not close successfully however the vein is closed from the mid thigh down to the proximal calf.  I have encouraged him to continue to wear his compression stockings now that he is back in work.  We have also discussed importance of intermittent leg elevation.  He will call if he develops any new symptoms.  Deitra Mayo [facility] [place] [phone]

## 2021-02-15 ENCOUNTER — Encounter: Payer: Self-pay | Admitting: Vascular Surgery

## 2021-04-12 ENCOUNTER — Telehealth (HOSPITAL_COMMUNITY): Payer: Self-pay | Admitting: Radiology

## 2021-04-12 NOTE — Telephone Encounter (Signed)
Returned call to patient. Dr. Theda Sers, patient's ortho doctor has referred him to my IR docs for knee aspiration. I have sent a message to the IR group to see if any of them do this. I will call the patient back once I have an answer. He agrees to this plan of care. JM ?

## 2021-04-13 ENCOUNTER — Telehealth (HOSPITAL_COMMUNITY): Payer: Self-pay | Admitting: Radiology

## 2021-04-13 NOTE — Telephone Encounter (Signed)
Pt called to schedule a Rt knee aspiration as referred by Dr. Theda Sers. We do not have access to his recent imaging. I did call [facility] (Dr. Harrietta Guardian office) and request a CD of his imaging. They will get a CD sent to [place]. We will have our rads review once we have the images and call to schedule if approved by rads. JM ?

## 2021-04-15 ENCOUNTER — Other Ambulatory Visit: Payer: Self-pay | Admitting: Specialist

## 2021-04-15 DIAGNOSIS — T148XXA Other injury of unspecified body region, initial encounter: Secondary | ICD-10-CM

## 2021-04-19 ENCOUNTER — Ambulatory Visit
Admission: RE | Admit: 2021-04-19 | Discharge: 2021-04-19 | Disposition: A | Discharge status: Home / Self Care | Payer: BC Managed Care – PPO | Source: Ambulatory Visit | Attending: Specialist | Admitting: Specialist

## 2021-04-19 DIAGNOSIS — T148XXA Other injury of unspecified body region, initial encounter: Secondary | ICD-10-CM

## 2021-04-19 NOTE — Procedures (Signed)
Interventional Radiology Procedure Note ? ?Procedure:  ?US guided aspiration of lateral right knee hematoma.  ?~15cc hematoma ? ?Findings: ?Complex echogenic fluid, compatible with coagulated blood/evolving hematoma.  ? ?Cx Sent ?Marland Kitchen  ?Complications: None ?Recommendations:  ?- Ok to shower tomorrow ?- Do not submerge for 7 days ?- Routine wound care ?- follow up culture  ? ?Signed, ? ?Dulcy Fanny. Earleen Newport, DO ? ? ?

## 2021-04-24 LAB — AEROBIC/ANAEROBIC CULTURE W GRAM STAIN (SURGICAL/DEEP WOUND)

## 2021-04-26 ENCOUNTER — Other Ambulatory Visit: Payer: Self-pay

## 2021-04-26 ENCOUNTER — Inpatient Hospital Stay (HOSPITAL_COMMUNITY)
Admission: AD | Admit: 2021-04-26 | Discharge: 2021-04-29 | DRG: 486 | Disposition: A | Discharge status: Home / Self Care | Payer: BC Managed Care – PPO | Source: Ambulatory Visit | Attending: Specialist | Admitting: Specialist

## 2021-04-26 ENCOUNTER — Observation Stay (HOSPITAL_BASED_OUTPATIENT_CLINIC_OR_DEPARTMENT_OTHER): Payer: BC Managed Care – PPO

## 2021-04-26 DIAGNOSIS — L089 Local infection of the skin and subcutaneous tissue, unspecified: Secondary | ICD-10-CM | POA: Diagnosis not present

## 2021-04-26 DIAGNOSIS — I712 Thoracic aortic aneurysm, without rupture, unspecified: Secondary | ICD-10-CM | POA: Diagnosis present

## 2021-04-26 DIAGNOSIS — Z888 Allergy status to other drugs, medicaments and biological substances status: Secondary | ICD-10-CM

## 2021-04-26 DIAGNOSIS — Z96641 Presence of right artificial hip joint: Secondary | ICD-10-CM | POA: Diagnosis present

## 2021-04-26 DIAGNOSIS — M79604 Pain in right leg: Secondary | ICD-10-CM | POA: Diagnosis not present

## 2021-04-26 DIAGNOSIS — S8001XA Contusion of right knee, initial encounter: Secondary | ICD-10-CM | POA: Diagnosis present

## 2021-04-26 DIAGNOSIS — Z6836 Body mass index (BMI) 36.0-36.9, adult: Secondary | ICD-10-CM

## 2021-04-26 DIAGNOSIS — Z880 Allergy status to penicillin: Secondary | ICD-10-CM

## 2021-04-26 DIAGNOSIS — E785 Hyperlipidemia, unspecified: Secondary | ICD-10-CM | POA: Diagnosis present

## 2021-04-26 DIAGNOSIS — G4733 Obstructive sleep apnea (adult) (pediatric): Secondary | ICD-10-CM | POA: Diagnosis present

## 2021-04-26 DIAGNOSIS — T148XXA Other injury of unspecified body region, initial encounter: Secondary | ICD-10-CM | POA: Diagnosis not present

## 2021-04-26 DIAGNOSIS — I10 Essential (primary) hypertension: Secondary | ICD-10-CM | POA: Diagnosis present

## 2021-04-26 DIAGNOSIS — M009 Pyogenic arthritis, unspecified: Principal | ICD-10-CM | POA: Diagnosis present

## 2021-04-26 DIAGNOSIS — Z825 Family history of asthma and other chronic lower respiratory diseases: Secondary | ICD-10-CM

## 2021-04-26 DIAGNOSIS — L03115 Cellulitis of right lower limb: Secondary | ICD-10-CM | POA: Diagnosis present

## 2021-04-26 DIAGNOSIS — R7303 Prediabetes: Secondary | ICD-10-CM | POA: Diagnosis present

## 2021-04-26 DIAGNOSIS — M7989 Other specified soft tissue disorders: Secondary | ICD-10-CM

## 2021-04-26 DIAGNOSIS — Z8249 Family history of ischemic heart disease and other diseases of the circulatory system: Secondary | ICD-10-CM

## 2021-04-26 DIAGNOSIS — Z96651 Presence of right artificial knee joint: Secondary | ICD-10-CM | POA: Diagnosis present

## 2021-04-26 DIAGNOSIS — Z809 Family history of malignant neoplasm, unspecified: Secondary | ICD-10-CM

## 2021-04-26 DIAGNOSIS — Z87891 Personal history of nicotine dependence: Secondary | ICD-10-CM

## 2021-04-26 DIAGNOSIS — E669 Obesity, unspecified: Secondary | ICD-10-CM | POA: Diagnosis present

## 2021-04-26 DIAGNOSIS — Z833 Family history of diabetes mellitus: Secondary | ICD-10-CM

## 2021-04-26 DIAGNOSIS — W010XXA Fall on same level from slipping, tripping and stumbling without subsequent striking against object, initial encounter: Secondary | ICD-10-CM | POA: Diagnosis present

## 2021-04-26 DIAGNOSIS — M00061 Staphylococcal arthritis, right knee: Principal | ICD-10-CM | POA: Diagnosis present

## 2021-04-26 LAB — COMPREHENSIVE METABOLIC PANEL
ALT: 14 U/L (ref 0–44)
AST: 14 U/L — ABNORMAL LOW (ref 15–41)
Albumin: 3.5 g/dL (ref 3.5–5.0)
Alkaline Phosphatase: 56 U/L (ref 38–126)
Anion gap: 8 (ref 5–15)
BUN: 16 mg/dL (ref 8–23)
CO2: 27 mmol/L (ref 22–32)
Calcium: 9 mg/dL (ref 8.9–10.3)
Chloride: 104 mmol/L (ref 98–111)
Creatinine, Ser: 0.94 mg/dL (ref 0.61–1.24)
GFR, Estimated: >60 mL/min (ref >60)
Glucose, Bld: 106 mg/dL — ABNORMAL HIGH (ref 70–99)
Potassium: 3.7 mmol/L (ref 3.5–5.1)
Sodium: 139 mmol/L (ref 135–145)
Total Bilirubin: 0.6 mg/dL (ref 0.3–1.2)
Total Protein: 7 g/dL (ref 6.5–8.1)

## 2021-04-26 LAB — CBC WITH DIFFERENTIAL/PLATELET
Abs Immature Granulocytes: 0.02 10*3/uL (ref 0.00–0.07)
Basophils Absolute: 0 10*3/uL (ref 0.0–0.1)
Basophils Relative: 0 %
Eosinophils Absolute: 0.1 10*3/uL (ref 0.0–0.5)
Eosinophils Relative: 1 %
HCT: 34.8 % — ABNORMAL LOW (ref 39.0–52.0)
Hemoglobin: 11.4 g/dL — ABNORMAL LOW (ref 13.0–17.0)
Immature Granulocytes: 0 %
Lymphocytes Relative: 9 %
Lymphs Abs: 0.7 10*3/uL (ref 0.7–4.0)
MCH: 29.2 pg (ref 26.0–34.0)
MCHC: 32.8 g/dL (ref 30.0–36.0)
MCV: 89 fL (ref 80.0–100.0)
Monocytes Absolute: 0.6 10*3/uL (ref 0.1–1.0)
Monocytes Relative: 8 %
Neutro Abs: 6.8 10*3/uL (ref 1.7–7.7)
Neutrophils Relative %: 82 %
Platelets: 270 10*3/uL (ref 150–400)
RBC: 3.91 MIL/uL — ABNORMAL LOW (ref 4.22–5.81)
RDW: 13.7 % (ref 11.5–15.5)
WBC: 8.2 10*3/uL (ref 4.0–10.5)
nRBC: 0 % (ref 0.0–0.2)

## 2021-04-26 LAB — SURGICAL PCR SCREEN
MRSA, PCR: NEGATIVE
Staphylococcus aureus: NEGATIVE

## 2021-04-26 LAB — C-REACTIVE PROTEIN: CRP: 3.7 mg/dL — ABNORMAL HIGH (ref <1.0)

## 2021-04-26 LAB — SEDIMENTATION RATE: Sed Rate: 72 mm/hr — ABNORMAL HIGH (ref 0–16)

## 2021-04-26 MED ORDER — SENNOSIDES-DOCUSATE SODIUM 8.6-50 MG PO TABS: 1.0000 | ORAL_TABLET | Freq: Every evening | ORAL | Status: DC | PRN

## 2021-04-26 MED ORDER — ACETAMINOPHEN 500 MG PO TABS
500.0000 mg | ORAL_TABLET | Freq: Four times a day (QID) | ORAL | Status: AC
  Administered 2021-04-26 – 2021-04-27 (×4): 500 mg via ORAL
  Filled 2021-04-26 (×5): qty 1

## 2021-04-26 MED ORDER — ACETAMINOPHEN 325 MG PO TABS
325.0000 mg | ORAL_TABLET | Freq: Four times a day (QID) | ORAL | Status: DC | PRN
  Administered 2021-04-29: 650 mg via ORAL
  Filled 2021-04-26: qty 2

## 2021-04-26 MED ORDER — MORPHINE SULFATE (PF) 2 MG/ML IV SOLN: 0.5000 mg | INTRAVENOUS | Status: DC | PRN

## 2021-04-26 MED ORDER — CEFAZOLIN SODIUM-DEXTROSE 2-4 GM/100ML-% IV SOLN
2.0000 g | Freq: Three times a day (TID) | INTRAVENOUS | Status: DC
  Administered 2021-04-26 – 2021-04-28 (×6): 2 g via INTRAVENOUS
  Filled 2021-04-26 (×6): qty 100

## 2021-04-26 MED ORDER — DIPHENHYDRAMINE HCL 12.5 MG/5ML PO ELIX: 12.5000 mg | ORAL_SOLUTION | ORAL | Status: DC | PRN

## 2021-04-26 MED ORDER — HYDROCODONE-ACETAMINOPHEN 7.5-325 MG PO TABS
1.0000 | ORAL_TABLET | ORAL | Status: DC | PRN
  Filled 2021-04-26: qty 2

## 2021-04-26 MED ORDER — ONDANSETRON HCL 4 MG/2ML IJ SOLN: 4.0000 mg | Freq: Four times a day (QID) | INTRAMUSCULAR | Status: DC | PRN

## 2021-04-26 MED ORDER — ONDANSETRON HCL 4 MG PO TABS: 4.0000 mg | ORAL_TABLET | Freq: Four times a day (QID) | ORAL | Status: DC | PRN

## 2021-04-26 MED ORDER — METHOCARBAMOL 1000 MG/10ML IJ SOLN
500.0000 mg | Freq: Four times a day (QID) | INTRAVENOUS | Status: DC | PRN
  Filled 2021-04-26: qty 5

## 2021-04-26 MED ORDER — METHOCARBAMOL 500 MG PO TABS
500.0000 mg | ORAL_TABLET | Freq: Four times a day (QID) | ORAL | Status: DC | PRN
  Administered 2021-04-28: 500 mg via ORAL
  Filled 2021-04-26: qty 1

## 2021-04-26 MED ORDER — BISACODYL 5 MG PO TBEC: 5.0000 mg | DELAYED_RELEASE_TABLET | Freq: Every day | ORAL | Status: DC | PRN

## 2021-04-26 MED ORDER — TRAMADOL HCL 50 MG PO TABS
50.0000 mg | ORAL_TABLET | Freq: Four times a day (QID) | ORAL | Status: DC
  Administered 2021-04-26 – 2021-04-29 (×10): 50 mg via ORAL
  Filled 2021-04-26 (×11): qty 1

## 2021-04-26 MED ORDER — HYDROCODONE-ACETAMINOPHEN 5-325 MG PO TABS
1.0000 | ORAL_TABLET | ORAL | Status: DC | PRN
  Administered 2021-04-27: 2 via ORAL
  Administered 2021-04-28: 1 via ORAL
  Filled 2021-04-26 (×2): qty 1
  Filled 2021-04-26: qty 2

## 2021-04-26 MED ORDER — SODIUM CHLORIDE 0.9 % IV SOLN: INTRAVENOUS | Status: DC

## 2021-04-26 MED ORDER — MUPIROCIN 2 % EX OINT
1.0000 "application " | TOPICAL_OINTMENT | Freq: Two times a day (BID) | CUTANEOUS | Status: DC
  Administered 2021-04-27 – 2021-04-28 (×3): 1 via NASAL
  Filled 2021-04-26: qty 22

## 2021-04-26 NOTE — H&P (Signed)
Travis Myers is an 66 y.o. male.   ?Chief Complaint: Right knee pain and swelling ?HPI: Pleasant 66 year old male who had a total knee arthroplasty in Feb of 2022. He was doing okay until he slipped and fell in January and hit his knee on the bath tub. Since then he has been having swelling in his knee. He was seen in the office and the implants looked okay. He at one point got an MRI that showed a hematoma. He had it aspirated one time in the office with minimal relief. He then had it aspirated again by Interventional radiology. He is now complaining of increased pain and swelling in the knee and redness. Denies any fevers, chills or night sweats. When his knee was aspirated by IR fluid was sent to the lab. Some staphylococcus lugdunensis. ? ?Past Medical History:  ?Diagnosis Date  ? Acute meniscal tear of knee left  ? Aneurysm, ascending aorta 4.8cm per ct 2011/ CARDIOLOGSIT--  DR Rollene Fare  LOV  10-07-2011  NOTE W/ CHART AND REQUEST LEXISCAN MYOVIEW AND ECHO TO BE FAXED.  ? ASYMPTOMATIC  ? Heart murmur MILD-- ASYMPTOMATIC  ? Hyperlipidemia   ? Hypertension   ? OA (osteoarthritis) of knee left  ? OSA (obstructive sleep apnea) NON-TOLERANT CPAP  ? no cpap PT REPORTS NEVER FINISHED TEST   ? Pre-diabetes   ? Sleep apnea   ? Tinnitus of both ears   ? ? ?Past Surgical History:  ?Procedure Laterality Date  ? [place]  ? DOPPLER ECHOCARDIOGRAPHY  10/18/2011  ? EF >55 %  ? ENDOVENOUS ABLATION SAPHENOUS VEIN W/ LASER Right 12/30/2020  ? endovenous laser ablation right greater saphenous vein by Gae Gallop MD  ? LUMBAR LAMINECTOMY/DECOMPRESSION MICRODISCECTOMY N/A 02/24/2016  ? Procedure: Bilateral Decompression L4-5, Discectomy L4-5 ;  Surgeon: Susa Day, MD;  Location: [facility];  Service: Orthopedics;  Laterality: N/A;  ? NM MYOCAR PERF WALL MOTION  10/13/2011  ? EF 49 %,low risk study  ? RIGHT KNEE SURGERY X 2     ? ARTHROSCOPY   ? TONSILLECTOMY  1965  ? torn meniscus left  knee surgery     ? TORN MENISCUS X 3 IN KNEES SURGERY     ? LEFT   ? TOTAL HIP ARTHROPLASTY  06/18/2009  ? OA LEFT HIP  ? TOTAL HIP ARTHROPLASTY Right 03/11/2014  ? Procedure: RIGHT TOTAL HIP ARTHROPLASTY ANTERIOR APPROACH;  Surgeon: [name], MD;  Location: [facility];  Service: Orthopedics;  Laterality: Right;  ? TOTAL KNEE ARTHROPLASTY Right 02/26/2020  ? Procedure: TOTAL KNEE ARTHROPLASTY;  Surgeon: Sydnee Cabal, MD;  Location: [facility];  Service: Orthopedics;  Laterality: Right;  adductor canal  ? TRANSTHORACIC ECHOCARDIOGRAM  09/11/2012  ? EF 55 to 60 %,mild aortic valve regurg,mild mitral valve regurg,lf and rt atriums mildly dilated  ? VARICOSE VEIN SURGERY Left 1980  ? veins removed from left leg  1994  ? ? ?Family History  ?Problem Relation Age of Onset  ? Diabetes Mother   ? Cancer Mother   ? Heart failure Other   ? Diabetes Other   ? Cancer Other   ? COPD Other   ? Colon cancer Neg Hx   ? Stomach cancer Neg Hx   ? ?Social History:  reports that he has never smoked. He quit smokeless tobacco use about 32 years ago. He reports current alcohol use of about 1.0 standard drink per week. He reports that he does not  use drugs. ? ?Allergies:  ?Allergies  ?Allergen Reactions  ? Penicillins Other (See Comments)  ?  Childhood reaction.  ?Has patient had a PCN reaction causing immediate rash, facial/tongue/throat swelling, SOB or lightheadedness with hypotension:unsure ?Has patient had a PCN reaction causing severe rash involving mucus membranes or skin necrosis:unsure ?Has patient had a PCN reaction that required hospitalization:unsure ?Has patient had a PCN reaction occurring within the last 10 years:No ?If all of the above answers are "NO", then may proceed with Cephalosporin use. ?  ? Quinolones   ?  Patient was warned about not using Cipro and similar antibiotics. ?Recent studies have raised concern that fluoroquinolone antibiotics could be associated with an increased risk of aortic  aneurysm ?Fluoroquinolones have non-antimicrobial properties that might jeopardise the integrity of the extracellular matrix of the vascular wall ?In a  propensity score matched cohort study in [place], there was a 66% increased rate of aortic aneurysm or dissection associated with oral fluoroquinolone use, compared wit  ? ? ?No medications prior to admission.  ? ? ?No results found for this or any previous visit (from the past 48 hour(s)). ?No results found. ? ?Results for orders placed or performed during the hospital encounter of 04/19/21  ?Aerobic/Anaerobic Culture w Gram Stain (surgical/deep wound)     Status: None  ? Collection Time: 04/19/21  1:39 PM  ? Specimen: Leg  ?Result Value Ref Range Status  ? Specimen Description LEG  Final  ? Special Requests SUPERFICIAL RIGHT KNEE HEMATOMA  Final  ? Gram Stain   Final  ?  ABUNDANT WBC PRESENT, PREDOMINANTLY PMN ?NO ORGANISMS SEEN ?  ? Culture   Final  ?  RARE STAPHYLOCOCCUS LUGDUNENSIS ?NO ANAEROBES ISOLATED ?Performed at [facility], [place] [address] [place],  [address] ?  ? Report Status 04/24/2021 FINAL  Final  ? Organism ID, Bacteria STAPHYLOCOCCUS LUGDUNENSIS  Final  ?    Susceptibility  ? Staphylococcus lugdunensis - MIC*  ?  CIPROFLOXACIN <=0.5 SENSITIVE Sensitive   ?  ERYTHROMYCIN >=8 RESISTANT Resistant   ?  GENTAMICIN <=0.5 SENSITIVE Sensitive   ?  OXACILLIN <=0.25 SENSITIVE Sensitive   ?  TETRACYCLINE <=1 SENSITIVE Sensitive   ?  VANCOMYCIN <=0.5 SENSITIVE Sensitive   ?  TRIMETH/SULFA <=10 SENSITIVE Sensitive   ?  CLINDAMYCIN >=8 RESISTANT Resistant   ?  RIFAMPIN <=0.5 SENSITIVE Sensitive   ?  Inducible Clindamycin NEGATIVE Sensitive   ?  * RARE STAPHYLOCOCCUS LUGDUNENSIS  ?  ? ?Review of Systems  ?All other systems reviewed and are negative. ? ?There were no vitals taken for this visit. ?Physical Exam ?Constitutional:   ?   Appearance: Normal appearance.  ?HENT:  ?   Head: Normocephalic and atraumatic.  ?Cardiovascular:  ?   Pulses: Normal  pulses.  ?   Heart sounds: Normal heart sounds.  ?Musculoskeletal:     ?   General: Swelling and tenderness present.  ?   Right lower leg: No edema.  ?Skin: ?   General: Skin is warm.  ?   Findings: Erythema and lesion present.  ?Neurological:  ?   General: No focal deficit present.  ?   Mental Status: He is alert and oriented to person, place, and time.  ?Psychiatric:     ?   Mood and Affect: Mood normal.     ?   Behavior: Behavior normal.     ?   Thought Content: Thought content normal.     ?   Judgment: Judgment  normal.  ?  ? ?Assessment/Plan ?Hematoma, possible infection: ?Due to the change in the appearance of the knee concern for infected superficial hematoma. He is admitted to the hospital for iv abx that ID has been consulted on. He will also have an I and D and aspiration of the hematoma. Patient was seen in the office this am by Dr. Theda Sers. He is all scheduled for surgery tomorrow afternoon. NPO after midnight. All questions encouraged and answered for patient in the office.  ? ?Drue Novel, PA ?04/26/2021, 1:29 PM ? ? ? ?

## 2021-04-26 NOTE — Consult Note (Addendum)
?   ? ? ? ? ?[facility]   ? ?Date of Admission:  04/26/2021    ? ?Reason for Consult: infected hematoma    ?Referring Provider: Theda Sers ? ?Abx: ?4/17-c cefazolin      ? ? ?Assessment: ?66 yo male hx right knee replacement 02/2020, who slipped and hit his right lateral knee in a bath tub 3 months prior to this admission with smoldering worsening pain/redness of the soft tissue s/p IR aspirate 4/10 cx growing staph lugdenensis ? ?He reports pain laterally and also anterior and medial to knee. The entire knee felt more warm ? ?At this time dr Theda Sers doesn't suspect prosthetic knee involvement. He plans I&D the infected hematoma tomorrow. Will await surgical finding and see if increased concern for joint involvement ? ?  ? ?Plan: ?Continue cefazolin ?Depending on operative finding and ongoing reevaluation, will see if there is concern for joint involvement ?Duration to be determined within the next few days ?Will get blood cultures as well, although abx dose already given once ?Discuss[facility] with [facility] ? ? ?I spent 60 minute reviewing data/chart, and coordinating care and >50% direct face to face time providing counseling/discussing diagnostics/treatment plan with patient ?  ? ? ? ? ?------------------------------------------------ ?Principal Problem: ?  Infection of right knee ([place]) ? ? ? ?HPI: Travis Myers is a 66 y.o. male hx right knee arhtroplasty 9622 complicated by 3 months of worsening pain/swelling laterally after a traumatic injury here now with concern for infected hematoma ? ?He did well post surgery ?3 months ago he was cutting his toe nail with the foot on edge of the bath tub, but then he slipped and fall hitting the lateral edge of the knee joint against the tub ? ?He developed a bruise/swelling laterally ?It never gone away but kept getting worse in terms of pain where he couldn't do much of any activities. ? ?He reports warmth of the entire knee but initially no  redness ? ?On 4/10 had IR aspirate the hematoma. Gram stain negative but eventually grew staph lugdenenensis.  ? ?The redness then came about since the aspiration ? ?He denies f/c ? ?He reports after a dose of abx here his pain is much better already ? ?No leukocytosis ?Crp a little high at 3.7 ?Sed rate 72 ? ? ? ? ?Family History  ?Problem Relation Age of Onset  ? Diabetes Mother   ? Cancer Mother   ? Heart failure Other   ? Diabetes Other   ? Cancer Other   ? COPD Other   ? Colon cancer Neg Hx   ? Stomach cancer Neg Hx   ? ? ?Social History  ? ?Tobacco Use  ? Smoking status: Never  ? Smokeless tobacco: Former  ?  Quit date: 09/11/1988  ?Vaping Use  ? Vaping Use: Never used  ?Substance Use Topics  ? Alcohol use: Yes  ?  Alcohol/week: 1.0 standard drink  ?  Types: 1 Standard drinks or equivalent per week  ?  Comment: RARE  ? Drug use: No  ? ? ?Allergies  ?Allergen Reactions  ? Penicillins Other (See Comments)  ?  Childhood reaction.  ?Has patient had a PCN reaction causing immediate rash, facial/tongue/throat swelling, SOB or lightheadedness with hypotension:unsure ?Has patient had a PCN reaction causing severe rash involving mucus membranes or skin necrosis:unsure ?Has patient had a PCN reaction that required hospitalization:unsure ?Has patient had a PCN reaction occurring within the last 10 years:No ?If all  of the above answers are "NO", then may proceed with Cephalosporin use. ?  ? Quinolones   ?  Patient was warned about not using Cipro and similar antibiotics. ?Recent studies have raised concern that fluoroquinolone antibiotics could be associated with an increased risk of aortic aneurysm ?Fluoroquinolones have non-antimicrobial properties that might jeopardise the integrity of the extracellular matrix of the vascular wall ?In a  propensity score matched cohort study in [place], there was a 66% increased rate of aortic aneurysm or dissection associated with oral fluoroquinolone use, compared wit  ? ? ?Review of  Systems: ?ROS ?All Other ROS was negative, except mentioned above ? ? ?Past Medical History:  ?Diagnosis Date  ? Acute meniscal tear of knee left  ? Aneurysm, ascending aorta 4.8cm per ct 2011/ CARDIOLOGSIT--  DR Rollene Fare  LOV  10-07-2011  NOTE W/ CHART AND REQUEST LEXISCAN MYOVIEW AND ECHO TO BE FAXED.  ? ASYMPTOMATIC  ? Heart murmur MILD-- ASYMPTOMATIC  ? Hyperlipidemia   ? Hypertension   ? OA (osteoarthritis) of knee left  ? OSA (obstructive sleep apnea) NON-TOLERANT CPAP  ? no cpap PT REPORTS NEVER FINISHED TEST   ? Pre-diabetes   ? Sleep apnea   ? Tinnitus of both ears   ? ? ? ? ? ?Scheduled Meds: ? acetaminophen  500 mg Oral Q6H  ? traMADol  50 mg Oral Q6H  ? ?Continuous Infusions: ? sodium chloride    ? methocarbamol (ROBAXIN) IV    ? ?PRN Meds:.[START ON 04/27/2021] acetaminophen, bisacodyl, diphenhydrAMINE, HYDROcodone-acetaminophen, HYDROcodone-acetaminophen, methocarbamol **OR** methocarbamol (ROBAXIN) IV, morphine injection, ondansetron **OR** ondansetron (ZOFRAN) IV, senna-docusate ? ? ?OBJECTIVE: ?Blood pressure 128/85, pulse 89, temperature 98 ?F (36.7 ?C), temperature source Oral, resp. rate 18, SpO2 99 %. ? ?Physical Exam ? ?General/constitutional: no distress, pleasant ?HEENT: Normocephalic, PER, Conj Clear, EOMI, Oropharynx clear ?Neck supple ?CV: rrr no mrg ?Lungs: clear to auscultation, normal respiratory effort ?Abd: Soft, Nontender ?Ext: no edema ?Msk/Skin: hyperpigmented looking with tenderness/brawny sensation lateral to right knee joint; intact rom; increased warmth at the lateral side and also around knee; no obvious knee effusion ?Neuro: nonfocal ? ? ?Lab Results ?Lab Results  ?Component Value Date  ? WBC 7.4 02/26/2020  ? HGB 13.8 02/26/2020  ? HCT 41.5 02/26/2020  ? MCV 96.5 02/26/2020  ? PLT 151 02/26/2020  ?  ?Lab Results  ?Component Value Date  ? CREATININE 0.85 09/04/2020  ? BUN 17 09/04/2020  ? NA 141 09/04/2020  ? K 4.7 09/04/2020  ? CL 101 09/04/2020  ? CO2 24 09/04/2020  ?  ?Lab  Results  ?Component Value Date  ? ALT 30 09/04/2020  ? AST 22 09/04/2020  ? ALKPHOS 55 09/04/2020  ? BILITOT 0.6 09/04/2020  ?  ? ? ?Microbiology: ?Recent Results (from the past 240 hour(s))  ?Aerobic/Anaerobic Culture w Gram Stain (surgical/deep wound)     Status: None  ? Collection Time: 04/19/21  1:39 PM  ? Specimen: Leg  ?Result Value Ref Range Status  ? Specimen Description LEG  Final  ? Special Requests SUPERFICIAL RIGHT KNEE HEMATOMA  Final  ? Gram Stain   Final  ?  ABUNDANT WBC PRESENT, PREDOMINANTLY PMN ?NO ORGANISMS SEEN ?  ? Culture   Final  ?  RARE STAPHYLOCOCCUS LUGDUNENSIS ?NO ANAEROBES ISOLATED ?Performed at [facility], [place] [address], [place], [place] [address] ?  ? Report Status 04/24/2021 FINAL  Final  ? Organism ID, Bacteria STAPHYLOCOCCUS LUGDUNENSIS  Final  ?    Susceptibility  ?  Staphylococcus lugdunensis - MIC*  ?  CIPROFLOXACIN <=0.5 SENSITIVE Sensitive   ?  ERYTHROMYCIN >=8 RESISTANT Resistant   ?  GENTAMICIN <=0.5 SENSITIVE Sensitive   ?  OXACILLIN <=0.25 SENSITIVE Sensitive   ?  TETRACYCLINE <=1 SENSITIVE Sensitive   ?  VANCOMYCIN <=0.5 SENSITIVE Sensitive   ?  TRIMETH/SULFA <=10 SENSITIVE Sensitive   ?  CLINDAMYCIN >=8 RESISTANT Resistant   ?  RIFAMPIN <=0.5 SENSITIVE Sensitive   ?  Inducible Clindamycin NEGATIVE Sensitive   ?  * RARE STAPHYLOCOCCUS LUGDUNENSIS  ? ? ? ?Serology: ? ? ? ?Imaging: ?If present, new imagings (plain films, ct scans, and mri) have been personally visualized and interpreted; radiology reports have been reviewed. Decision making incorporated into the Impression / Recommendations. ? ? ? ?Jabier Mutton, MD ?[facility] ?[place] ?[phone] pager    ?04/26/2021, 2:12 PM ? ?

## 2021-04-26 NOTE — Progress Notes (Signed)
RLE venous duplex has been completed.  Preliminary results given to [place], Therapist, sports. ? ? ?Results can be found under chart review under CV PROC. ?04/26/2021 3:44 PM ?Corazon Nickolas RVT, RDMS ? ?

## 2021-04-27 ENCOUNTER — Inpatient Hospital Stay (HOSPITAL_COMMUNITY): Payer: BC Managed Care – PPO | Admitting: Certified Registered"

## 2021-04-27 ENCOUNTER — Other Ambulatory Visit: Payer: Self-pay

## 2021-04-27 ENCOUNTER — Inpatient Hospital Stay (HOSPITAL_COMMUNITY): Admission: RE | Admit: 2021-04-27 | Payer: BC Managed Care – PPO | Source: Home / Self Care | Admitting: Specialist

## 2021-04-27 ENCOUNTER — Encounter (HOSPITAL_COMMUNITY): Payer: Self-pay | Admitting: Specialist

## 2021-04-27 ENCOUNTER — Encounter (HOSPITAL_COMMUNITY)
Admission: AD | Disposition: A | Discharge status: Home / Self Care | Payer: Self-pay | Source: Ambulatory Visit | Attending: Specialist

## 2021-04-27 DIAGNOSIS — L089 Local infection of the skin and subcutaneous tissue, unspecified: Secondary | ICD-10-CM | POA: Diagnosis not present

## 2021-04-27 DIAGNOSIS — E785 Hyperlipidemia, unspecified: Secondary | ICD-10-CM | POA: Diagnosis present

## 2021-04-27 DIAGNOSIS — Z96641 Presence of right artificial hip joint: Secondary | ICD-10-CM | POA: Diagnosis present

## 2021-04-27 DIAGNOSIS — Z87891 Personal history of nicotine dependence: Secondary | ICD-10-CM | POA: Diagnosis not present

## 2021-04-27 DIAGNOSIS — Z96651 Presence of right artificial knee joint: Secondary | ICD-10-CM | POA: Diagnosis present

## 2021-04-27 DIAGNOSIS — Z825 Family history of asthma and other chronic lower respiratory diseases: Secondary | ICD-10-CM | POA: Diagnosis not present

## 2021-04-27 DIAGNOSIS — R7303 Prediabetes: Secondary | ICD-10-CM | POA: Diagnosis present

## 2021-04-27 DIAGNOSIS — Z8249 Family history of ischemic heart disease and other diseases of the circulatory system: Secondary | ICD-10-CM | POA: Diagnosis not present

## 2021-04-27 DIAGNOSIS — E669 Obesity, unspecified: Secondary | ICD-10-CM | POA: Diagnosis present

## 2021-04-27 DIAGNOSIS — I10 Essential (primary) hypertension: Secondary | ICD-10-CM | POA: Diagnosis present

## 2021-04-27 DIAGNOSIS — G4733 Obstructive sleep apnea (adult) (pediatric): Secondary | ICD-10-CM | POA: Diagnosis present

## 2021-04-27 DIAGNOSIS — W010XXA Fall on same level from slipping, tripping and stumbling without subsequent striking against object, initial encounter: Secondary | ICD-10-CM | POA: Diagnosis present

## 2021-04-27 DIAGNOSIS — Z888 Allergy status to other drugs, medicaments and biological substances status: Secondary | ICD-10-CM | POA: Diagnosis not present

## 2021-04-27 DIAGNOSIS — Z880 Allergy status to penicillin: Secondary | ICD-10-CM | POA: Diagnosis not present

## 2021-04-27 DIAGNOSIS — L03115 Cellulitis of right lower limb: Secondary | ICD-10-CM | POA: Diagnosis present

## 2021-04-27 DIAGNOSIS — T148XXA Other injury of unspecified body region, initial encounter: Secondary | ICD-10-CM | POA: Diagnosis not present

## 2021-04-27 DIAGNOSIS — M00061 Staphylococcal arthritis, right knee: Secondary | ICD-10-CM | POA: Diagnosis present

## 2021-04-27 DIAGNOSIS — Z809 Family history of malignant neoplasm, unspecified: Secondary | ICD-10-CM | POA: Diagnosis not present

## 2021-04-27 DIAGNOSIS — Z833 Family history of diabetes mellitus: Secondary | ICD-10-CM | POA: Diagnosis not present

## 2021-04-27 DIAGNOSIS — S8001XA Contusion of right knee, initial encounter: Secondary | ICD-10-CM | POA: Diagnosis present

## 2021-04-27 DIAGNOSIS — I712 Thoracic aortic aneurysm, without rupture, unspecified: Secondary | ICD-10-CM | POA: Diagnosis present

## 2021-04-27 DIAGNOSIS — Z6836 Body mass index (BMI) 36.0-36.9, adult: Secondary | ICD-10-CM | POA: Diagnosis not present

## 2021-04-27 HISTORY — PX: INCISION AND DRAINAGE OF WOUND: SHX1803

## 2021-04-27 LAB — HIV ANTIBODY (ROUTINE TESTING W REFLEX): HIV Screen 4th Generation wRfx: NONREACTIVE

## 2021-04-27 SURGERY — IRRIGATION AND DEBRIDEMENT WOUND
Anesthesia: General | Laterality: Right

## 2021-04-27 MED ORDER — PROPOFOL 10 MG/ML IV BOLUS
INTRAVENOUS | Status: AC
  Filled 2021-04-27: qty 20

## 2021-04-27 MED ORDER — LISINOPRIL 20 MG PO TABS
40.0000 mg | ORAL_TABLET | Freq: Every day | ORAL | Status: DC
  Administered 2021-04-27 – 2021-04-29 (×3): 40 mg via ORAL
  Filled 2021-04-27 (×4): qty 2

## 2021-04-27 MED ORDER — BUPIVACAINE HCL (PF) 0.5 % IJ SOLN
INTRAMUSCULAR | Status: AC
  Filled 2021-04-27: qty 30

## 2021-04-27 MED ORDER — FENTANYL CITRATE PF 50 MCG/ML IJ SOSY
PREFILLED_SYRINGE | INTRAMUSCULAR | Status: AC
  Filled 2021-04-27: qty 3

## 2021-04-27 MED ORDER — FENTANYL CITRATE PF 50 MCG/ML IJ SOSY
50.0000 ug | PREFILLED_SYRINGE | INTRAMUSCULAR | Status: DC
  Filled 2021-04-27: qty 2

## 2021-04-27 MED ORDER — FUROSEMIDE 40 MG PO TABS
40.0000 mg | ORAL_TABLET | Freq: Every day | ORAL | Status: DC
  Administered 2021-04-28 – 2021-04-29 (×2): 40 mg via ORAL
  Filled 2021-04-27 (×3): qty 1

## 2021-04-27 MED ORDER — DEXAMETHASONE SODIUM PHOSPHATE 10 MG/ML IJ SOLN
INTRAMUSCULAR | Status: DC | PRN
  Administered 2021-04-27: 8 mg via INTRAVENOUS

## 2021-04-27 MED ORDER — KETOROLAC TROMETHAMINE 15 MG/ML IJ SOLN
INTRAMUSCULAR | Status: AC
  Filled 2021-04-27: qty 1

## 2021-04-27 MED ORDER — MIDAZOLAM HCL 2 MG/2ML IJ SOLN
1.0000 mg | INTRAMUSCULAR | Status: DC
  Filled 2021-04-27: qty 2

## 2021-04-27 MED ORDER — TRAZODONE HCL 50 MG PO TABS
50.0000 mg | ORAL_TABLET | Freq: Every day | ORAL | Status: DC
  Administered 2021-04-27 – 2021-04-28 (×2): 50 mg via ORAL
  Filled 2021-04-27 (×3): qty 1

## 2021-04-27 MED ORDER — ACETAMINOPHEN 10 MG/ML IV SOLN: 1000.0000 mg | Freq: Once | INTRAVENOUS | Status: DC | PRN

## 2021-04-27 MED ORDER — MIDAZOLAM HCL 2 MG/2ML IJ SOLN
INTRAMUSCULAR | Status: AC
  Filled 2021-04-27: qty 2

## 2021-04-27 MED ORDER — MECLIZINE HCL 25 MG PO TABS
25.0000 mg | ORAL_TABLET | Freq: Every day | ORAL | Status: DC
  Administered 2021-04-28 – 2021-04-29 (×2): 25 mg via ORAL
  Filled 2021-04-27 (×3): qty 1

## 2021-04-27 MED ORDER — FENTANYL CITRATE (PF) 250 MCG/5ML IJ SOLN
INTRAMUSCULAR | Status: DC | PRN
  Administered 2021-04-27 (×2): 75 ug via INTRAVENOUS

## 2021-04-27 MED ORDER — 0.9 % SODIUM CHLORIDE (POUR BTL) OPTIME
TOPICAL | Status: DC | PRN
  Administered 2021-04-27: 1000 mL

## 2021-04-27 MED ORDER — LIDOCAINE 2% (20 MG/ML) 5 ML SYRINGE
INTRAMUSCULAR | Status: DC | PRN
  Administered 2021-04-27: 60 mg via INTRAVENOUS

## 2021-04-27 MED ORDER — KETAMINE HCL 50 MG/5ML IJ SOSY
PREFILLED_SYRINGE | INTRAMUSCULAR | Status: AC
  Filled 2021-04-27: qty 5

## 2021-04-27 MED ORDER — AMLODIPINE BESYLATE 10 MG PO TABS
10.0000 mg | ORAL_TABLET | Freq: Every day | ORAL | Status: DC
  Administered 2021-04-27 – 2021-04-29 (×3): 10 mg via ORAL
  Filled 2021-04-27 (×4): qty 1

## 2021-04-27 MED ORDER — FENTANYL CITRATE PF 50 MCG/ML IJ SOSY
25.0000 ug | PREFILLED_SYRINGE | INTRAMUSCULAR | Status: DC | PRN
  Administered 2021-04-27 (×2): 50 ug via INTRAVENOUS

## 2021-04-27 MED ORDER — ONDANSETRON HCL 4 MG/2ML IJ SOLN
INTRAMUSCULAR | Status: AC
  Filled 2021-04-27: qty 2

## 2021-04-27 MED ORDER — MIDAZOLAM HCL 5 MG/5ML IJ SOLN
INTRAMUSCULAR | Status: DC | PRN
  Administered 2021-04-27: 2 mg via INTRAVENOUS

## 2021-04-27 MED ORDER — AMISULPRIDE (ANTIEMETIC) 5 MG/2ML IV SOLN: 10.0000 mg | Freq: Once | INTRAVENOUS | Status: DC | PRN

## 2021-04-27 MED ORDER — POVIDONE-IODINE 10 % EX SWAB
2.0000 "application " | Freq: Once | CUTANEOUS | Status: AC
  Administered 2021-04-27: 2 via TOPICAL

## 2021-04-27 MED ORDER — LACTATED RINGERS IV SOLN: INTRAVENOUS | Status: DC

## 2021-04-27 MED ORDER — ENOXAPARIN SODIUM 40 MG/0.4ML IJ SOSY
40.0000 mg | PREFILLED_SYRINGE | INTRAMUSCULAR | Status: DC
  Administered 2021-04-28 – 2021-04-29 (×2): 40 mg via SUBCUTANEOUS
  Filled 2021-04-27 (×2): qty 0.4

## 2021-04-27 MED ORDER — ONDANSETRON HCL 4 MG/2ML IJ SOLN: 4.0000 mg | Freq: Once | INTRAMUSCULAR | Status: DC | PRN

## 2021-04-27 MED ORDER — KETOROLAC TROMETHAMINE 15 MG/ML IJ SOLN
15.0000 mg | Freq: Once | INTRAMUSCULAR | Status: AC | PRN
  Administered 2021-04-27: 15 mg via INTRAVENOUS

## 2021-04-27 MED ORDER — PROPOFOL 10 MG/ML IV BOLUS
INTRAVENOUS | Status: DC | PRN
  Administered 2021-04-27: 200 mg via INTRAVENOUS

## 2021-04-27 MED ORDER — ONDANSETRON HCL 4 MG/2ML IJ SOLN
INTRAMUSCULAR | Status: DC | PRN
  Administered 2021-04-27: 4 mg via INTRAVENOUS

## 2021-04-27 MED ORDER — ATORVASTATIN CALCIUM 40 MG PO TABS
40.0000 mg | ORAL_TABLET | Freq: Every day | ORAL | Status: DC
  Administered 2021-04-27 – 2021-04-29 (×3): 40 mg via ORAL
  Filled 2021-04-27 (×4): qty 1

## 2021-04-27 MED ORDER — CHLORHEXIDINE GLUCONATE 0.12 % MT SOLN
15.0000 mL | OROMUCOSAL | Status: AC
  Administered 2021-04-27: 15 mL via OROMUCOSAL

## 2021-04-27 MED ORDER — KETAMINE HCL 10 MG/ML IJ SOLN
INTRAMUSCULAR | Status: DC | PRN
  Administered 2021-04-27: 10 mg via INTRAVENOUS
  Administered 2021-04-27: 30 mg via INTRAVENOUS

## 2021-04-27 MED ORDER — DEXAMETHASONE SODIUM PHOSPHATE 10 MG/ML IJ SOLN
INTRAMUSCULAR | Status: AC
  Filled 2021-04-27: qty 1

## 2021-04-27 MED ORDER — FENTANYL CITRATE (PF) 250 MCG/5ML IJ SOLN
INTRAMUSCULAR | Status: AC
  Filled 2021-04-27: qty 5

## 2021-04-27 SURGICAL SUPPLY — 44 items
BAG COUNTER SPONGE SURGICOUNT (BAG) IMPLANT
BAG ZIPLOCK 12X15 (MISCELLANEOUS) ×1 IMPLANT
BLADE SAW SGTL 11.0X1.19X90.0M (BLADE) IMPLANT
BNDG ELASTIC 4X5.8 VLCR STR LF (GAUZE/BANDAGES/DRESSINGS) ×1 IMPLANT
BNDG ELASTIC 6X5.8 VLCR STR LF (GAUZE/BANDAGES/DRESSINGS) ×2 IMPLANT
BNDG GAUZE ELAST 4 BULKY (GAUZE/BANDAGES/DRESSINGS) ×1 IMPLANT
COVER SURGICAL LIGHT HANDLE (MISCELLANEOUS) ×2 IMPLANT
CUFF TOURN SGL QUICK 34 (TOURNIQUET CUFF) ×1
CUFF TRNQT CYL 34X4.125X (TOURNIQUET CUFF) ×1 IMPLANT
DERMABOND ADVANCED (GAUZE/BANDAGES/DRESSINGS)
DERMABOND ADVANCED .7 DNX12 (GAUZE/BANDAGES/DRESSINGS) ×1 IMPLANT
DRAPE INCISE IOBAN 66X45 STRL (DRAPES) ×3 IMPLANT
DRAPE SHEET LG 3/4 BI-LAMINATE (DRAPES) ×1 IMPLANT
DRSG ADAPTIC 3X8 NADH LF (GAUZE/BANDAGES/DRESSINGS) IMPLANT
DRSG AQUACEL AG ADV 3.5X10 (GAUZE/BANDAGES/DRESSINGS) ×1 IMPLANT
DRSG PAD ABDOMINAL 8X10 ST (GAUZE/BANDAGES/DRESSINGS) ×2 IMPLANT
DRSG TEGADERM 4X4.75 (GAUZE/BANDAGES/DRESSINGS) IMPLANT
DURAPREP 26ML APPLICATOR (WOUND CARE) ×3 IMPLANT
EVACUATOR 1/8 PVC DRAIN (DRAIN) IMPLANT
GAUZE SPONGE 2X2 8PLY STRL LF (GAUZE/BANDAGES/DRESSINGS) IMPLANT
GAUZE SPONGE 4X4 12PLY STRL (GAUZE/BANDAGES/DRESSINGS) ×3 IMPLANT
GAUZE XEROFORM 1X8 LF (GAUZE/BANDAGES/DRESSINGS) ×1 IMPLANT
GLOVE BIOGEL PI IND STRL 7.5 (GLOVE) ×1 IMPLANT
GLOVE BIOGEL PI IND STRL 8 (GLOVE) ×2 IMPLANT
GLOVE BIOGEL PI INDICATOR 7.5 (GLOVE) ×1
GLOVE BIOGEL PI INDICATOR 8 (GLOVE) ×2
GLOVE SKINSENSE NS SZ8.0 LF (GLOVE) ×1
GLOVE SKINSENSE STRL SZ8.0 LF (GLOVE) ×1 IMPLANT
GLOVE SURG SS PI 7.0 STRL IVOR (GLOVE) ×2 IMPLANT
HANDPIECE INTERPULSE COAX TIP (DISPOSABLE) ×1
MANIFOLD NEPTUNE II (INSTRUMENTS) ×2 IMPLANT
PACK TOTAL KNEE CUSTOM (KITS) ×2 IMPLANT
PADDING CAST COTTON 6X4 STRL (CAST SUPPLIES) ×1 IMPLANT
PROTECTOR NERVE ULNAR (MISCELLANEOUS) ×2 IMPLANT
SET HNDPC FAN SPRY TIP SCT (DISPOSABLE) ×1 IMPLANT
SPONGE GAUZE 2X2 STER 10/PKG (GAUZE/BANDAGES/DRESSINGS)
STAPLER VISISTAT 35W (STAPLE) IMPLANT
SUT MNCRL AB 4-0 PS2 18 (SUTURE) ×1 IMPLANT
SUT STRATAFIX PDS+ 0 24IN (SUTURE) ×1 IMPLANT
SUT VIC AB 1 CT1 36 (SUTURE) ×2 IMPLANT
SUT VIC AB 2-0 CT1 27 (SUTURE)
SUT VIC AB 2-0 CT1 TAPERPNT 27 (SUTURE) ×3 IMPLANT
SWAB COLLECTION DEVICE MRSA (MISCELLANEOUS) ×1 IMPLANT
SWAB CULTURE ESWAB REG 1ML (MISCELLANEOUS) ×1 IMPLANT

## 2021-04-27 NOTE — Transfer of Care (Signed)
Immediate Anesthesia Transfer of Care Note ? ?Patient: Travis Myers ? ?Procedure(s) Performed: IRRIGATION AND DEBRIDEMENT OF HEMATOMA AND ABCESS ,ASPIRATE RIGHT KNEE (Right) ? ?Patient Location: PACU ? ?Anesthesia Type:General ? ?Level of Consciousness: awake, alert , oriented and patient cooperative ? ?Airway & Oxygen Therapy: Patient Spontanous Breathing and Patient connected to face mask oxygen ? ?Post-op Assessment: Report given to RN and Post -op Vital signs reviewed and stable ? ?Post vital signs: Reviewed and stable ? ?Last Vitals:  ?Vitals Value Taken Time  ?BP    ?Temp 36.6 ?C 04/27/21 1646  ?Pulse 61 04/27/21 1646  ?Resp    ?SpO2    ? ? ?Last Pain:  ?Vitals:  ? 04/27/21 1412  ?TempSrc: Oral  ?PainSc:   ?   ? ?Patients Stated Pain Goal: 2 (04/27/21 0720) ? ?Complications: No notable events documented. ?

## 2021-04-27 NOTE — Evaluation (Signed)
Physical Therapy Evaluation ?Patient Details ?Name: Travis Myers ?MRN: [id number] ?DOB: 08-24-1955 ?Today's Date: 04/27/2021 ? ?History of Present Illness ? 66 yo male admitted with R knee pain and swelling, scheduled for I and D R knee on 04/27/21. PMH: R TKA 02/26/20, HTN, HLD, pre-DM, sleep apnea, AAA, meniscal tear L knee, R THA 2016, L THA 2011 lumbar laminectomy/decompression 2018  ?Clinical Impression ? Pt admitted with above diagnosis.  ?Pt is independent at his baseline. Works for [employer].  ?Pt reports more controlled R knee pain since admission and he is mobilizing well today, overall independent. Pt will likely not need f/u PT post acute however we will see pt again after surgery per (scheduled for I&D later today). Pt reports he has been doing exercises within limits of pain (heel raises, hamstring curls, knee flexion) ?Follow for changes in functional status or d/c needs. Update plan accordingly ? Pt currently with functional limitations due to the deficits listed below (see PT Problem List). Pt will benefit from skilled PT to increase their independence and safety with mobility to allow discharge to the venue listed below.   ?   ?   ? ?Recommendations for follow up therapy are one component of a multi-disciplinary discharge planning process, led by the attending physician.  Recommendations may be updated based on patient status, additional functional criteria and insurance authorization. ? ?Follow Up Recommendations Follow physician's recommendations for discharge plan and follow up therapies ? ?  ?Assistance Recommended at Discharge None  ?Patient can return home with the following ?   ? ?  ?Equipment Recommendations None recommended by PT  ?Recommendations for Other Services ?    ?  ?Functional Status Assessment Patient has not had a recent decline in their functional status  ? ?  ?Precautions / Restrictions Precautions ?Precautions: Fall ?Restrictions ?Weight Bearing Restrictions:  No ?RLE Weight Bearing: Weight bearing as tolerated  ? ?  ? ?Mobility ? Bed Mobility ?Overal bed mobility: Independent ?  ?  ?  ?  ?  ?  ?  ?  ? ?Transfers ?Overall transfer level: Independent ?  ?  ?  ?  ?  ?  ?  ?  ?  ?  ? ?Ambulation/Gait ?Ambulation/Gait assistance: Supervision ?Gait Distance (Feet): 150 Feet ?Assistive device: IV Pole, None ?Gait Pattern/deviations: Step-through pattern, Decreased stance time - right, Knee flexed in stance - right ?  ?  ?  ?General Gait Details: intermittent use of IV pole, not reliant on UE support. no LOB, steady gait and pt reporting pain much more controlled at thiis time ? ?Stairs ?  ?  ?  ?  ?  ? ?Wheelchair Mobility ?  ? ?Modified Rankin (Stroke Patients Only) ?  ? ?  ? ?Balance Overall balance assessment: Modified Independent ?  ?  ?  ?  ?  ?  ?  ?  ?  ?  ?  ?  ?  ?  ?  ?  ?  ?  ?   ? ? ? ?Pertinent Vitals/Pain Pain Assessment ?Pain Assessment: 0-10 ?Pain Score: 2  ?Pain Location: R knee ?Pain Descriptors / Indicators: Discomfort ?Pain Intervention(s): Limited activity within patient's tolerance, Monitored during session  ? ? ?Home Living Family/patient expects to be discharged to:: Private residence ?Living Arrangements: Spouse/significant other ?Available Help at Discharge: Family ?Type of Home: House ?Home Access: Stairs to enter ?Entrance Stairs-Rails: Right ?Entrance Stairs-Number of Steps: 3 ?  ?Home Layout: One level ?Home Equipment: Rolling  Walker (2 wheels);Cane - single point;BSC/3in1 ?   ?  ?Prior Function Prior Level of Function : Independent/Modified Independent ?  ?  ?  ?  ?  ?  ?Mobility Comments: mod I to Independent ?  ?  ? ? ?Hand Dominance  ?   ? ?  ?Extremity/Trunk Assessment  ? Upper Extremity Assessment ?Upper Extremity Assessment: Overall WFL for tasks assessed ?  ? ?Lower Extremity Assessment ?Lower Extremity Assessment: RLE deficits/detail ?RLE Deficits / Details: strength grossly WFL, AROM  knee flexion ~ 10 degrees to ~95 degrees ?  ? ?    ?Communication  ? Communication: No difficulties  ?Cognition Arousal/Alertness: Awake/alert ?Behavior During Therapy: [facility] for tasks assessed/performed ?Overall Cognitive Status: Within Functional Limits for tasks assessed ?  ?  ?  ?  ?  ?  ?  ?  ?  ?  ?  ?  ?  ?  ?  ?  ?  ?  ?  ? ?  ?General Comments   ? ?  ?Exercises    ? ?Assessment/Plan  ?  ?PT Assessment Patient needs continued PT services  ?PT Problem List Decreased range of motion;Pain ? ?   ?  ?PT Treatment Interventions Therapeutic exercise;Stair training;Patient/family education;Gait training   ? ?PT Goals (Current goals can be found in the Care Plan section)  ?Acute Rehab PT Goals ?Patient Stated Goal: get back to working out ?PT Goal Formulation: With patient ?Time For Goal Achievement: 05/04/21 ?Potential to Achieve Goals: Good ? ?  ?Frequency Min 2X/week (change after surgery if still on caseload) ?  ? ? ?Co-evaluation   ?  ?  ?  ?  ? ? ?  ?AM-PAC PT "6 Clicks" Mobility  ?Outcome Measure Help needed turning from your back to your side while in a flat bed without using bedrails?: None ?Help needed moving from lying on your back to sitting on the side of a flat bed without using bedrails?: None ?Help needed moving to and from a bed to a chair (including a wheelchair)?: None ?Help needed standing up from a chair using your arms (e.g., wheelchair or bedside chair)?: None ?Help needed to walk in hospital room?: None ?Help needed climbing 3-5 steps with a railing? : A Little ?6 Click Score: 23 ? ?  ?End of Session Equipment Utilized During Treatment: Gait belt ?Activity Tolerance: Patient tolerated treatment well ?Patient left: in bed;with call bell/phone within reach ?  ?PT Visit Diagnosis: Other abnormalities of gait and mobility (R26.89);Difficulty in walking, not elsewhere classified (R26.2) ?  ? ?Time: 1204-1219 ?PT Time Calculation (min) (ACUTE ONLY): 15 min ? ? ?Charges:   PT Evaluation ?$PT Eval Low Complexity: 1 Low ?  ?  ?   ? ? ?Baxter Flattery, PT ? ?Acute  Rehab Dept [facility]) [phone] ?Pager [phone] ? ?04/27/2021 ? ? ?Gregory Dowe ?04/27/2021, 1:20 PM ? ?

## 2021-04-27 NOTE — H&P (Signed)
Travis Myers is an 66 y.o. male.   ?Chief Complaint: Right knee pain and swelling ?HPI: Pleasant 66 year old male who had a total knee arthroplasty in Feb of 2022. He was doing okay until he slipped and fell in January and hit his knee on the bath tub. Since then he has been having swelling in his knee. He was seen in the office and the implants looked okay. He at one point got an MRI that showed a hematoma. He had it aspirated one time in the office with minimal relief. He then had it aspirated again by Interventional radiology. He is now complaining of increased pain and swelling in the knee and redness. Denies any fevers, chills or night sweats. When his knee was aspirated by IR fluid was sent to the lab. Some staphylococcus lugdunensis. ? ?Past Medical History:  ?Diagnosis Date  ? Acute meniscal tear of knee left  ? Aneurysm, ascending aorta 4.8cm per ct 2011/ CARDIOLOGSIT--  DR Rollene Fare  LOV  10-07-2011  NOTE W/ CHART AND REQUEST LEXISCAN MYOVIEW AND ECHO TO BE FAXED.  ? ASYMPTOMATIC  ? Heart murmur MILD-- ASYMPTOMATIC  ? Hyperlipidemia   ? Hypertension   ? OA (osteoarthritis) of knee left  ? OSA (obstructive sleep apnea) NON-TOLERANT CPAP  ? no cpap PT REPORTS NEVER FINISHED TEST   ? Pre-diabetes   ? Sleep apnea   ? Tinnitus of both ears   ? ? ?Past Surgical History:  ?Procedure Laterality Date  ? [place]  ? DOPPLER ECHOCARDIOGRAPHY  10/18/2011  ? EF >55 %  ? ENDOVENOUS ABLATION SAPHENOUS VEIN W/ LASER Right 12/30/2020  ? endovenous laser ablation right greater saphenous vein by Gae Gallop MD  ? LUMBAR LAMINECTOMY/DECOMPRESSION MICRODISCECTOMY N/A 02/24/2016  ? Procedure: Bilateral Decompression L4-5, Discectomy L4-5 ;  Surgeon: Susa Day, MD;  Location: [facility];  Service: Orthopedics;  Laterality: N/A;  ? NM MYOCAR PERF WALL MOTION  10/13/2011  ? EF 49 %,low risk study  ? RIGHT KNEE SURGERY X 2     ? ARTHROSCOPY   ? TONSILLECTOMY  1965  ? torn meniscus left  knee surgery     ? TORN MENISCUS X 3 IN KNEES SURGERY     ? LEFT   ? TOTAL HIP ARTHROPLASTY  06/18/2009  ? OA LEFT HIP  ? TOTAL HIP ARTHROPLASTY Right 03/11/2014  ? Procedure: RIGHT TOTAL HIP ARTHROPLASTY ANTERIOR APPROACH;  Surgeon: [name], MD;  Location: [facility];  Service: Orthopedics;  Laterality: Right;  ? TOTAL KNEE ARTHROPLASTY Right 02/26/2020  ? Procedure: TOTAL KNEE ARTHROPLASTY;  Surgeon: Sydnee Cabal, MD;  Location: [facility];  Service: Orthopedics;  Laterality: Right;  adductor canal  ? TRANSTHORACIC ECHOCARDIOGRAM  09/11/2012  ? EF 55 to 60 %,mild aortic valve regurg,mild mitral valve regurg,lf and rt atriums mildly dilated  ? VARICOSE VEIN SURGERY Left 1980  ? veins removed from left leg  1994  ? ? ?Family History  ?Problem Relation Age of Onset  ? Diabetes Mother   ? Cancer Mother   ? Heart failure Other   ? Diabetes Other   ? Cancer Other   ? COPD Other   ? Colon cancer Neg Hx   ? Stomach cancer Neg Hx   ? ?Social History:  reports that he has never smoked. He quit smokeless tobacco use about 32 years ago. He reports current alcohol use of about 1.0 standard drink per week. He reports that he does not  use drugs. ? ?Allergies:  ?Allergies  ?Allergen Reactions  ? Penicillins Other (See Comments)  ?  Childhood reaction.  ?Has patient had a PCN reaction causing immediate rash, facial/tongue/throat swelling, SOB or lightheadedness with hypotension:unsure ?Has patient had a PCN reaction causing severe rash involving mucus membranes or skin necrosis:unsure ?Has patient had a PCN reaction that required hospitalization:unsure ?Has patient had a PCN reaction occurring within the last 10 years:No ?If all of the above answers are "NO", then may proceed with Cephalosporin use. ?  ? Quinolones   ?  Patient was warned about not using Cipro and similar antibiotics. ?Recent studies have raised concern that fluoroquinolone antibiotics could be associated with an increased risk of aortic  aneurysm ?Fluoroquinolones have non-antimicrobial properties that might jeopardise the integrity of the extracellular matrix of the vascular wall ?In a  propensity score matched cohort study in [place], there was a 66% increased rate of aortic aneurysm or dissection associated with oral fluoroquinolone use, compared wit  ? ? ?No medications prior to admission.  ? ? ?No results found for this or any previous visit (from the past 48 hour(s)). ?No results found. ? ?Results for orders placed or performed during the hospital encounter of 04/19/21  ?Aerobic/Anaerobic Culture w Gram Stain (surgical/deep wound)     Status: None  ? Collection Time: 04/19/21  1:39 PM  ? Specimen: Leg  ?Result Value Ref Range Status  ? Specimen Description LEG  Final  ? Special Requests SUPERFICIAL RIGHT KNEE HEMATOMA  Final  ? Gram Stain   Final  ?  ABUNDANT WBC PRESENT, PREDOMINANTLY PMN ?NO ORGANISMS SEEN ?  ? Culture   Final  ?  RARE STAPHYLOCOCCUS LUGDUNENSIS ?NO ANAEROBES ISOLATED ?Performed at [facility], [place] [address], [place], [place] [address] ?  ? Report Status 04/24/2021 FINAL  Final  ? Organism ID, Bacteria STAPHYLOCOCCUS LUGDUNENSIS  Final  ?    Susceptibility  ? Staphylococcus lugdunensis - MIC*  ?  CIPROFLOXACIN <=0.5 SENSITIVE Sensitive   ?  ERYTHROMYCIN >=8 RESISTANT Resistant   ?  GENTAMICIN <=0.5 SENSITIVE Sensitive   ?  OXACILLIN <=0.25 SENSITIVE Sensitive   ?  TETRACYCLINE <=1 SENSITIVE Sensitive   ?  VANCOMYCIN <=0.5 SENSITIVE Sensitive   ?  TRIMETH/SULFA <=10 SENSITIVE Sensitive   ?  CLINDAMYCIN >=8 RESISTANT Resistant   ?  RIFAMPIN <=0.5 SENSITIVE Sensitive   ?  Inducible Clindamycin NEGATIVE Sensitive   ?  * RARE STAPHYLOCOCCUS LUGDUNENSIS  ?  ? ?Review of Systems  ?All other systems reviewed and are negative. ? ?There were no vitals taken for this visit. ?Physical Exam ?Constitutional:   ?   Appearance: Normal appearance.  ?HENT:  ?   Head: Normocephalic and atraumatic.  ?Cardiovascular:  ?   Pulses: Normal  pulses.  ?   Heart sounds: Normal heart sounds.  ?Musculoskeletal:     ?   General: Swelling and tenderness present.  ?   Right lower leg: No edema.  ?Skin: ?   General: Skin is warm.  ?   Findings: Erythema and lesion present.  ?Neurological:  ?   General: No focal deficit present.  ?   Mental Status: He is alert and oriented to person, place, and time.  ?Psychiatric:     ?   Mood and Affect: Mood normal.     ?   Behavior: Behavior normal.     ?   Thought Content: Thought content normal.     ?   Judgment: Judgment  normal.  ?  ? ?Assessment/Plan ?Hematoma, possible infection: ?Due to the change in the appearance of the knee concern for infected superficial hematoma. He is admitted to the hospital for iv abx that ID has been consulted on. He will also have an I and D and aspiration of the hematoma. Patient was seen in the office this am by Dr. Theda Sers. He is all scheduled for surgery tomorrow afternoon. NPO after midnight. All questions encouraged and answered for patient in the office.  ? ?Drue Novel, PA ?04/26/2021, 1:29 PM ? ? ? ? ?

## 2021-04-27 NOTE — Op Note (Signed)
Preop diagnosis 1 right lateral knee traumatic hematoma with secondary infection and associated cellulitis #2 history of right total knee arthroplasty. ? ?Postop diagnosis 1 same 2 same ?Procedure #1 right knee incision and drainage of lateral knee subcutaneous hematoma #2 right knee sterile aspiration sent for culture ?Surgeon Hart Robinsons, MD ?Assistant Elenor Legato, PA-C ?Drains 1 Penrose ?Tourniquet time none ?Disposition PACU stable ?Complications none ?Findings the right knee aspirate revealed only a small amount of bloody fluid no evidence of infection at all procedure for Gram stain and culture.  The right lateral knee hematoma was palpated correlated with MRI scan.  Findings were that of primarily old blood gelatinous to liquid no obvious pus or purulence. ? ?Operative details ?Patient was encountered holding x-rays identified marked signed appropriate IV started having heavy have been started when he was boxing good around-the-clock he was taken operating placed under general anesthesia the right wrist elevated and prepped with DuraPrep and draped in sterile fashion after timeout using sterile technique the knee was aspirated from proximal medial aspect of the area make sure stable weight from the area of area of previous area erythema and cellulitis the tap was therefore it was basically dry with only a very few cc of blood with specific Gram stain culture but no obvious infection at all significant effusion ? ?Attention was after given time to have it done prior to the aspiration a small 11 blade was utilized to make a small incision in the inferior posterior aspect of the hematoma was opened up with a hemostat and bloody fluid was removed in a liquid the gelatinous phase but no obvious pus or purulence was encountered.  This hematoma was was completely evacuated from the subcu fat be completely extra-articular and then copiously irrigated with saline.  A Penrose drain was placed sewn into place sterile  compressive dressing was applied.  He tolerated seizure well no complication problems.  Detailed operative examination.  For further treatment antibiotics support to be determined as the studies returned. ?

## 2021-04-27 NOTE — Anesthesia Preprocedure Evaluation (Signed)
Anesthesia Evaluation  ? ? ?Reviewed: ?Allergy & Precautions, Patient's Chart, lab work & pertinent test results ? ?Airway ?Mallampati: III ? ?TM Distance: >3 FB ?Neck ROM: Full ? ? ? Dental ?no notable dental hx. ? ?  ?Pulmonary ?sleep apnea ,  ?  ?Pulmonary exam normal ? ? ? ? ? ? ? Cardiovascular ?hypertension, Pt. on medications ?Normal cardiovascular exam ? ?Thoracic aortic aneurysm ?  ?Neuro/Psych ?negative neurological ROS ? negative psych ROS  ? GI/Hepatic ?negative GI ROS, Neg liver ROS,   ?Endo/Other  ?negative endocrine ROS ? Renal/GU ?negative Renal ROS  ? ?  ?Musculoskeletal ? ?(+) Arthritis ,  ? Abdominal ?(+) + obese,   ?Peds ? Hematology ? ?(+) Blood dyscrasia, anemia ,   ?Anesthesia Other Findings ?Right hematoma, abcess ? Reproductive/Obstetrics ? ?  ? ? ? ? ? ? ? ? ? ? ? ? ? ?  ?  ? ? ? ? ? ? ? ? ?Anesthesia Physical ?Anesthesia Plan ? ?ASA: 2 ? ?Anesthesia Plan: General  ? ?Post-op Pain Management:   ? ?Induction: Intravenous ? ?PONV Risk Score and Plan: 2 and Ondansetron, Dexamethasone, Midazolam and Treatment may vary due to age or medical condition ? ?Airway Management Planned: LMA ? ?Additional Equipment:  ? ?Intra-op Plan:  ? ?Post-operative Plan: Extubation in OR ? ?Informed Consent: I have reviewed the patients History and Physical, chart, labs and discussed the procedure including the risks, benefits and alternatives for the proposed anesthesia with the patient or authorized representative who has indicated his/her understanding and acceptance.  ? ? ? ?Dental advisory given ? ?Plan Discussed with: CRNA ? ?Anesthesia Plan Comments:   ? ? ? ? ? ? ?Anesthesia Quick Evaluation ? ?

## 2021-04-27 NOTE — Anesthesia Postprocedure Evaluation (Signed)
Anesthesia Post Note ? ?Patient: Travis Myers ? ?Procedure(s) Performed: IRRIGATION AND DEBRIDEMENT OF HEMATOMA AND ABCESS ,ASPIRATE RIGHT KNEE (Right) ? ?  ? ?Patient location during evaluation: PACU ?Anesthesia Type: General ?Level of consciousness: awake ?Pain management: pain level controlled ?Vital Signs Assessment: post-procedure vital signs reviewed and stable ?Respiratory status: spontaneous breathing, nonlabored ventilation, respiratory function stable and patient connected to nasal cannula oxygen ?Cardiovascular status: blood pressure returned to baseline and stable ?Postop Assessment: no apparent nausea or vomiting ?Anesthetic complications: no ? ? ?No notable events documented. ? ?Last Vitals:  ?Vitals:  ? 04/27/21 1800 04/27/21 1806  ?BP: (!) 143/86   ?Pulse: (!) 56 (!) 58  ?Resp: 11 12  ?Temp:    ?SpO2: 98% 99%  ?  ?Last Pain:  ?Vitals:  ? 04/27/21 1800  ?TempSrc:   ?PainSc: 3   ? ? ?  ?  ?  ?  ?  ?  ? ?Asser Lucena P Day Greb ? ? ? ? ?

## 2021-04-27 NOTE — Anesthesia Procedure Notes (Signed)
Procedure Name: LMA Insertion ?Date/Time: 04/27/2021 4:57 PM ?Performed by: Cleda Daub, CRNA ?Pre-anesthesia Checklist: Patient identified, Emergency Drugs available, Suction available and Patient being monitored ?Patient Re-evaluated:Patient Re-evaluated prior to induction ?Oxygen Delivery Method: Circle system utilized ?Preoxygenation: Pre-oxygenation with 100% oxygen ?Induction Type: IV induction ?LMA: LMA inserted ?LMA Size: 5.0 ?Number of attempts: 1 ?Airway Equipment and Method: Oral airway ?Placement Confirmation: positive ETCO2 and breath sounds checked- equal and bilateral ?Tube secured with: Tape ?Dental Injury: Teeth and Oropharynx as per pre-operative assessment  ? ? ? ? ?

## 2021-04-28 ENCOUNTER — Encounter (HOSPITAL_COMMUNITY): Payer: Self-pay | Admitting: Specialist

## 2021-04-28 DIAGNOSIS — T148XXA Other injury of unspecified body region, initial encounter: Secondary | ICD-10-CM | POA: Diagnosis not present

## 2021-04-28 DIAGNOSIS — L089 Local infection of the skin and subcutaneous tissue, unspecified: Secondary | ICD-10-CM | POA: Diagnosis not present

## 2021-04-28 MED ORDER — CEFADROXIL 500 MG PO CAPS
1000.0000 mg | ORAL_CAPSULE | Freq: Two times a day (BID) | ORAL | Status: DC
  Administered 2021-04-28 – 2021-04-29 (×3): 1000 mg via ORAL
  Filled 2021-04-28 (×3): qty 2

## 2021-04-28 NOTE — Progress Notes (Signed)
?   ? ? ? ? ?[facility] ? ?Date of Admission:  04/26/2021    ? ?Abx: ?4/17-c cefazolin                                                          ?  ?  ?Assessment: ?66 yo male hx right knee replacement 02/2020, who slipped and hit his right lateral knee in a bath tub 3 months prior to this admission with smoldering worsening pain/redness of the soft tissue s/p IR aspirate 4/10 cx growing staph lugdenensis ?  ?He reports pain laterally and also anterior and medial to knee. The entire knee felt more warm ?  ?At this time dr Theda Sers doesn't suspect prosthetic knee involvement. He plans I&D the infected hematoma tomorrow. Will await surgical finding and see if increased concern for joint involvement ?  ?--------- ?4/19 assessment ?Reviewed op note; no concern for joint involvement ?Joint fluid aspirated and cx ngtd ? ?Reasonable to switch abx to oral cefadroxil 1 gram twice a day. Given chronic nature of the infected hematoma, will plan 2 weeks abx duration and follow up in clinic ?  ?  ?Plan: ?Stop cefazolin; start cefadroxil 1 gram po twice daily for 2 weeks from today ?Ok to d/c from id standpoint ?Follow up with dr Gale Journey in ID clinic on 5/02 @ 1045 ?Will sign off ?Discussed with the [facility] ? ?I spent more than 35 minute reviewing data/chart, and coordinating care and >50% direct face to face time providing counseling/discussing diagnostics/treatment plan with patient ? ? ?Principal Problem: ?  Infection of right knee ([place]) ?Active Problems: ?  Infected hematoma ? ? ?Allergies  ?Allergen Reactions  ? Penicillins Other (See Comments)  ?  Childhood reaction- not recalled ?Has patient had a PCN reaction causing immediate rash, facial/tongue/throat swelling, SOB or lightheadedness with hypotension:unsure ?Has patient had a PCN reaction causing severe rash involving mucus membranes or skin necrosis:unsure ?Has patient had a PCN reaction that required hospitalization:unsure ?Has patient had a PCN  reaction occurring within the last 10 years:No ?If all of the above answers are "NO", then may proceed with Cephalosporin use. ?  ? Quinolones Other (See Comments)  ?  Patient was warned about not using Cipro and similar antibiotics. ?Recent studies have raised concern that fluoroquinolone antibiotics could be associated with an increased risk of aortic aneurysm ?Fluoroquinolones have non-antimicrobial properties that might jeopardise the integrity of the extracellular matrix of the vascular wall ?In a  propensity score matched cohort study in [place], there was a 66% increased rate of aortic aneurysm or dissection associated with oral fluoroquinolone use, compared wit  ? ? ?Scheduled Meds: ? amLODipine  10 mg Oral Daily  ? atorvastatin  40 mg Oral Daily  ? cefadroxil  1,000 mg Oral BID  ? enoxaparin (LOVENOX) injection  40 mg Subcutaneous Q24H  ? furosemide  40 mg Oral Daily  ? lisinopril  40 mg Oral Daily  ? meclizine  25 mg Oral Daily  ? mupirocin ointment  1 application. Nasal BID  ? traMADol  50 mg Oral Q6H  ? traZODone  50 mg Oral QHS  ? ?Continuous Infusions: ? sodium chloride 50 mL/hr at 04/27/21 1908  ? methocarbamol (ROBAXIN) IV    ? ?PRN Meds:.acetaminophen, bisacodyl, diphenhydrAMINE, HYDROcodone-acetaminophen, HYDROcodone-acetaminophen, methocarbamol **OR**  methocarbamol (ROBAXIN) IV, morphine injection, ondansetron **OR** ondansetron (ZOFRAN) IV, senna-docusate ? ? ?SUBJECTIVE: ?S/p I&D old hematoma 4/18 ?Joint aspirated ?Reviewed op note; no concern for joint involvement ? ?Joint fluid cx in progress ?No tissue cx sent ? ?Review of Systems: ?ROS ?All other ROS was negative, except mentioned above ? ? ? ? ?OBJECTIVE: ?Vitals:  ? 04/27/21 2111 04/28/21 0136 04/28/21 0515 04/28/21 1354  ?BP: 134/75 124/67 132/72 126/80  ?Pulse: 72 60 66 76  ?Resp: '17 17 17 20  '$ ?Temp: 98.2 ?F (36.8 ?C) 98.1 ?F (36.7 ?C) 97.9 ?F (36.6 ?C) 98.6 ?F (37 ?C)  ?TempSrc: Oral Oral Oral Oral  ?SpO2: 97% 97% 97% 97%  ?Weight:       ?Height:      ? ?Body mass index is 36.52 kg/m?. ? ?Physical Exam ?General/constitutional: no distress, pleasant ?HEENT: Normocephalic, PER, Conj Clear, EOMI, Oropharynx clear ?Neck supple ?CV: rrr no mrg ?Lungs: clear to auscultation, normal respiratory effort ?Abd: Soft, Nontender ?Ext: no edema ?Skin: No Rash ?Neuro: nonfocal ?MSK: RLE in dressing that is clean and dry; he can easily do knee/hip rom without inducing tenderness around the knee, much improved from initial presentation ? ? ?Lab Results ?Lab Results  ?Component Value Date  ? WBC 8.2 04/26/2021  ? HGB 11.4 (L) 04/26/2021  ? HCT 34.8 (L) 04/26/2021  ? MCV 89.0 04/26/2021  ? PLT 270 04/26/2021  ?  ?Lab Results  ?Component Value Date  ? CREATININE 0.94 04/26/2021  ? BUN 16 04/26/2021  ? NA 139 04/26/2021  ? K 3.7 04/26/2021  ? CL 104 04/26/2021  ? CO2 27 04/26/2021  ?  ?Lab Results  ?Component Value Date  ? ALT 14 04/26/2021  ? AST 14 (L) 04/26/2021  ? ALKPHOS 56 04/26/2021  ? BILITOT 0.6 04/26/2021  ?  ? ? ?Microbiology: ?Recent Results (from the past 240 hour(s))  ?Aerobic/Anaerobic Culture w Gram Stain (surgical/deep wound)     Status: None  ? Collection Time: 04/19/21  1:39 PM  ? Specimen: Leg  ?Result Value Ref Range Status  ? Specimen Description LEG  Final  ? Special Requests SUPERFICIAL RIGHT KNEE HEMATOMA  Final  ? Gram Stain   Final  ?  ABUNDANT WBC PRESENT, PREDOMINANTLY PMN ?NO ORGANISMS SEEN ?  ? Culture   Final  ?  RARE STAPHYLOCOCCUS LUGDUNENSIS ?NO ANAEROBES ISOLATED ?Performed at [facility], [place] [address], [place], [place] [address] ?  ? Report Status 04/24/2021 FINAL  Final  ? Organism ID, Bacteria STAPHYLOCOCCUS LUGDUNENSIS  Final  ?    Susceptibility  ? Staphylococcus lugdunensis - MIC*  ?  CIPROFLOXACIN <=0.5 SENSITIVE Sensitive   ?  ERYTHROMYCIN >=8 RESISTANT Resistant   ?  GENTAMICIN <=0.5 SENSITIVE Sensitive   ?  OXACILLIN <=0.25 SENSITIVE Sensitive   ?  TETRACYCLINE <=1 SENSITIVE Sensitive   ?  VANCOMYCIN <=0.5  SENSITIVE Sensitive   ?  TRIMETH/SULFA <=10 SENSITIVE Sensitive   ?  CLINDAMYCIN >=8 RESISTANT Resistant   ?  RIFAMPIN <=0.5 SENSITIVE Sensitive   ?  Inducible Clindamycin NEGATIVE Sensitive   ?  * RARE STAPHYLOCOCCUS LUGDUNENSIS  ?Surgical PCR screen     Status: None  ? Collection Time: 04/26/21  4:15 PM  ? Specimen: Nasal Mucosa; Nasal Swab  ?Result Value Ref Range Status  ? MRSA, PCR NEGATIVE NEGATIVE Final  ? Staphylococcus aureus NEGATIVE NEGATIVE Final  ?  Comment: (NOTE) ?The Xpert SA Assay (FDA approved for NASAL specimens in patients 26 ?years of age  and older), is one component of a comprehensive ?surveillance program. It is not intended to diagnose infection nor to ?guide or monitor treatment. ?Performed at [facility], [place] ?[place], [place] [address] ?  ?Culture, blood (Routine X 2) w Reflex to ID Panel     Status: None (Preliminary result)  ? Collection Time: 04/26/21  6:32 PM  ? Specimen: BLOOD  ?Result Value Ref Range Status  ? Specimen Description   Final  ?  BLOOD LEFT ANTECUBITAL ?Performed at [facility], [place] [address], [place], [place] [address] ?  ? Special Requests   Final  ?  BOTTLES DRAWN AEROBIC ONLY Blood Culture adequate volume ?Performed at [facility], [place] [address], [place], [place] [address] ?  ? Culture   Final  ?  NO GROWTH 2 DAYS ?Performed at [facility], [place] [address] [place], [place] [address] ?  ? Report Status PENDING  Incomplete  ?Culture, blood (Routine X 2) w Reflex to ID Panel     Status: None (Preliminary result)  ? Collection Time: 04/26/21  6:34 PM  ? Specimen: BLOOD  ?Result Value Ref Range Status  ? Specimen Description   Final  ?  BLOOD BLOOD RIGHT WRIST ?Performed at [facility], [place] [address], [place], [place] [address] ?  ? Special Requests   Final  ?  BOTTLES DRAWN AEROBIC ONLY Blood Culture adequate volume ?Performed at [facility], [place]  [address] [place], [place] [address] ?  ? Culture   Final  ?  NO GROWTH 2 DAYS ?Performed at [facility], [place] [address] [place], [place] [address] ?  ? Report Status PENDING  Incomplete  ?Body fluid cultu

## 2021-04-28 NOTE — Progress Notes (Signed)
Subjective: ?1 Day Post-Op Procedure(s) (LRB): ?IRRIGATION AND DEBRIDEMENT OF HEMATOMA AND ABCESS ,ASPIRATE RIGHT KNEE (Right) ?Patient reports pain as very mild. He is able to move leg without any pain.  ?He reports no event overnight. ?Denies any fevers/ chills ? ?Objective: ?Vital signs in last 24 hours: ?Temp:  [97.9 ?F (36.6 ?C)-98.9 ?F (37.2 ?C)] 97.9 ?F (36.6 ?C) (04/19 0515) ?Pulse Rate:  [56-72] 66 (04/19 0515) ?Resp:  [10-20] 17 (04/19 0515) ?BP: (124-161)/(67-88) 132/72 (04/19 0515) ?SpO2:  [93 %-100 %] 97 % (04/19 0515) ?Weight:  [136.1 kg] 136.1 kg (04/18 1400) ? ?Intake/Output from previous day: ?04/18 0701 - 04/19 0700 ?In: 2147 [P.O.:240; I.V.:1707; IV Piggyback:200] ?Out: 2 [Blood:2] ?Intake/Output this shift: ?No intake/output data recorded. ? ?Recent Labs  ?  04/26/21 ?1421  ?HGB 11.4*  ? ?Recent Labs  ?  04/26/21 ?1421  ?WBC 8.2  ?RBC 3.91*  ?HCT 34.8*  ?PLT 270  ? ?Recent Labs  ?  04/26/21 ?1421  ?NA 139  ?K 3.7  ?CL 104  ?CO2 27  ?BUN 16  ?CREATININE 0.94  ?GLUCOSE 106*  ?CALCIUM 9.0  ? ?No results for input(s): LABPT, INR in the last 72 hours. ? ?Neurologically intact ?Neurovascular intact ?Sensation intact distally ?Intact pulses distally ?Dorsiflexion/Plantar flexion intact ?Incision: dressing C/D/I and changed today, pen rose drain still in place ?Compartment soft ?Erythema in lower right leg improving ? ? ?Assessment/Plan: ?1 Day Post-Op Procedure(s) (LRB): ?IRRIGATION AND DEBRIDEMENT OF HEMATOMA AND ABCESS ,ASPIRATE RIGHT KNEE (Right) ?Advance diet ?Continue ABX therapy due to possible infected hematoma. Cx pending. ID is following along with patient  ?Plan for discharge tomorrow once cultrues are back ?New dressings placed today, drain left in until tomorrow ? ? ?Anticipated LOS equal to or greater than 2 midnights due to ?- Age 40 and older with one or more of the following: ? - Obesity ? - Expected need for hospital services (PT, OT, Nursing) required for safe  discharge ? -  Anticipated need for postoperative skilled nursing care or inpatient rehab ? - Active co-morbidities: possible infection ?OR  ? ?- Unanticipated findings during/Post Surgery: Infected knee joint or operative site  ?- Patient is a high risk of re-admission due to: None ? ? ?Drue Novel, PA-C ?[facility] ?04/28/2021, 7:33 AM ? ?

## 2021-04-28 NOTE — Progress Notes (Signed)
Physical Therapy Treatment ?Patient Details ?Name: Travis Myers ?MRN: [id number] ?DOB: [birth year] ?Today's Date: 04/28/2021 ? ? ?History of Present Illness 66 yo male admitted with R knee pain, swelling, infection. S/P I&D of hematoma/abscess and knee aspiration on 04/27/21. PMH: R TKA 02/26/20, HTN, HLD, pre-DM, sleep apnea, AAA, meniscal tear L knee, R THA 2016, L THA 2011 lumbar laminectomy/decompression 2018 ? ?  ?PT Comments  ? ? Pt continues to participate well. Minimal pain with activity. Will continue to follow.    ?Recommendations for follow up therapy are one component of a multi-disciplinary discharge planning process, led by the attending physician.  Recommendations may be updated based on patient status, additional functional criteria and insurance authorization. ? ?Follow Up Recommendations ? Follow physician's recommendations for discharge plan and follow up therapies ?  ?  ?Assistance Recommended at Discharge None  ?Patient can return home with the following Assist for transportation ?  ?Equipment Recommendations ? None recommended by PT  ?  ?Recommendations for Other Services   ? ? ?  ?Precautions / Restrictions Precautions ?Precautions: Fall ?Restrictions ?Weight Bearing Restrictions: No ?RLE Weight Bearing: Weight bearing as tolerated  ?  ? ?Mobility ? Bed Mobility ?Overal bed mobility: Independent ?  ?  ?  ?  ?  ?  ?  ?  ? ?Transfers ?Overall transfer level: Modified independent ?  ?  ?  ?  ?  ?  ?  ?  ?  ?  ? ?Ambulation/Gait ?Ambulation/Gait assistance: Supervision ?Gait Distance (Feet): 165 Feet ?Assistive device: IV Pole ?Gait Pattern/deviations: Step-through pattern, Decreased stance time - right, Knee flexed in stance - right ?  ?  ?  ?General Gait Details: Used IV pole for support. No knee buckling, no LOB. Tolerated distance well. ? ? ?Stairs ?  ?  ?  ?  ?  ? ? ?Wheelchair Mobility ?  ? ?Modified Rankin (Stroke Patients Only) ?  ? ? ?  ?Balance Overall balance assessment: No apparent  balance deficits (not formally assessed) ?  ?  ?  ?  ?  ?  ?  ?  ?  ?  ?  ?  ?  ?  ?  ?  ?  ?  ?  ? ?  ?Cognition Arousal/Alertness: Awake/alert ?Behavior During Therapy: [facility] for tasks assessed/performed ?Overall Cognitive Status: Within Functional Limits for tasks assessed ?  ?  ?  ?  ?  ?  ?  ?  ?  ?  ?  ?  ?  ?  ?  ?  ?  ?  ?  ? ?  ?Exercises   ? ?  ?General Comments   ?  ?  ? ?Pertinent Vitals/Pain Pain Assessment ?Pain Assessment: 0-10 ?Pain Score: 2  ?Pain Location: R knee ?Pain Descriptors / Indicators: Discomfort ?Pain Intervention(s): Monitored during session  ? ? ?Home Living   ?  ?  ?  ?  ?  ?  ?  ?  ?  ?   ?  ?Prior Function    ?  ?  ?   ? ?PT Goals (current goals can now be found in the care plan section) Progress towards PT goals: Progressing toward goals ? ?  ?Frequency ? ? ? Min 2X/week ? ? ? ?  ?PT Plan Current plan remains appropriate  ? ? ?Co-evaluation   ?  ?  ?  ?  ? ?  ?AM-PAC PT "6 Clicks" Mobility   ?Outcome Measure ? Help needed  turning from your back to your side while in a flat bed without using bedrails?: None ?Help needed moving from lying on your back to sitting on the side of a flat bed without using bedrails?: None ?Help needed moving to and from a bed to a chair (including a wheelchair)?: None ?Help needed standing up from a chair using your arms (e.g., wheelchair or bedside chair)?: None ?Help needed to walk in hospital room?: None ?Help needed climbing 3-5 steps with a railing? : A Little ?6 Click Score: 23 ? ?  ?End of Session Equipment Utilized During Treatment: Gait belt ?Activity Tolerance: Patient tolerated treatment well ?Patient left: in bed;with call bell/phone within reach ?  ?PT Visit Diagnosis: Difficulty in walking, not elsewhere classified (R26.2);Other abnormalities of gait and mobility (R26.89);History of falling (Z91.81) ?  ? ? ?Time: 6606-3016 ?PT Time Calculation (min) (ACUTE ONLY): 17 min ? ?Charges:  $Gait Training: 8-22 mins          ?           ? ? ? ? ? ?Doreatha Massed, PT ?Acute Rehabilitation  ?Office: [phone] ?Pager: [phone] ? ?  ? ?

## 2021-04-28 NOTE — Plan of Care (Signed)
?  Problem: Activity: ?Goal: Risk for activity intolerance will decrease ?Outcome: Progressing ?  ?Problem: Safety: ?Goal: Ability to remain free from injury will improve ?Outcome: Progressing ?  ?Problem: Pain Managment: ?Goal: General experience of comfort will improve ?Outcome: Progressing ?  ?

## 2021-04-29 MED ORDER — TRAMADOL HCL 50 MG PO TABS: 50.0000 mg | ORAL_TABLET | Freq: Four times a day (QID) | ORAL | 0 refills | Status: AC | PRN

## 2021-04-29 MED ORDER — METHOCARBAMOL 500 MG PO TABS: 500.0000 mg | ORAL_TABLET | Freq: Four times a day (QID) | ORAL | 0 refills | Status: DC | PRN

## 2021-04-29 MED ORDER — ONDANSETRON HCL 4 MG PO TABS: 4.0000 mg | ORAL_TABLET | Freq: Four times a day (QID) | ORAL | 0 refills | Status: DC | PRN

## 2021-04-29 MED ORDER — ASPIRIN EC 81 MG PO TBEC: 81.0000 mg | DELAYED_RELEASE_TABLET | Freq: Two times a day (BID) | ORAL | 0 refills | Status: AC

## 2021-04-29 MED ORDER — OXYCODONE HCL 5 MG PO TABS: 5.0000 mg | ORAL_TABLET | ORAL | 0 refills | Status: DC | PRN

## 2021-04-29 MED ORDER — CEFADROXIL 500 MG PO CAPS: 1000.0000 mg | ORAL_CAPSULE | Freq: Two times a day (BID) | ORAL | 0 refills | Status: AC

## 2021-04-29 NOTE — Progress Notes (Signed)
Discharge package printed and instructions given to patient. Verbalizes understanding.  

## 2021-04-29 NOTE — Progress Notes (Signed)
Physical Therapy Treatment ?Patient Details ?Name: Travis Myers ?MRN: [id number] ?DOB: [birth year] ?Today's Date: 04/29/2021 ? ? ?History of Present Illness 66 yo male admitted with R knee pain, swelling, infection. S/P I&D of hematoma/abscess and knee aspiration on 04/27/21. PMH: R TKA 02/26/20, HTN, HLD, pre-DM, sleep apnea, AAA, meniscal tear L knee, R THA 2016, L THA 2011 lumbar laminectomy/decompression 2018 ? ?  ?PT Comments  ? ? Progressing well. Minimal pain. Encouraged pt to start ROM exercises at home as tolerated. All education completed.    ?Recommendations for follow up therapy are one component of a multi-disciplinary discharge planning process, led by the attending physician.  Recommendations may be updated based on patient status, additional functional criteria and insurance authorization. ? ?Follow Up Recommendations ? Follow physician's recommendations for discharge plan and follow up therapies ?  ?  ?Assistance Recommended at Discharge None  ?Patient can return home with the following Assist for transportation ?  ?Equipment Recommendations ? None recommended by PT  ?  ?Recommendations for Other Services   ? ? ?  ?Precautions / Restrictions Precautions ?Precautions: Fall ?Restrictions ?Weight Bearing Restrictions: No ?RLE Weight Bearing: Weight bearing as tolerated  ?  ? ?Mobility ? Bed Mobility ?Overal bed mobility: Independent ?  ?  ?  ?  ?  ?  ?  ?  ? ?Transfers ?Overall transfer level: Modified independent ?  ?  ?  ?  ?  ?  ?  ?  ?  ?  ? ?Ambulation/Gait ?Ambulation/Gait assistance: Supervision ?Gait Distance (Feet): 60 Feet ?Assistive device: None ?Gait Pattern/deviations: Step-through pattern, Decreased stance time - right, Knee flexed in stance - right ?  ?  ?  ?General Gait Details: No knee buckling, no LOB. Tolerated distance well. ? ? ?Stairs ?Stairs: Yes ?Stairs assistance: Supervision ?Stair Management: Forwards, Two rails ?Number of Stairs: 5 ?General stair comments: up and over  portable stairs x 2. cues for safety, technique, sequence ? ? ?Wheelchair Mobility ?  ? ?Modified Rankin (Stroke Patients Only) ?  ? ? ?  ?Balance Overall balance assessment: Mild deficits observed, not formally tested ?  ?  ?  ?  ?  ?  ?  ?  ?  ?  ?  ?  ?  ?  ?  ?  ?  ?  ?  ? ?  ?Cognition Arousal/Alertness: Awake/alert ?Behavior During Therapy: [facility] for tasks assessed/performed ?Overall Cognitive Status: Within Functional Limits for tasks assessed ?  ?  ?  ?  ?  ?  ?  ?  ?  ?  ?  ?  ?  ?  ?  ?  ?  ?  ?  ? ?  ?Exercises   ? ?  ?General Comments   ?  ?  ? ?Pertinent Vitals/Pain Pain Assessment ?Pain Assessment: 0-10 ?Pain Score: 2  ?Pain Location: R knee ?Pain Descriptors / Indicators: Discomfort, Sore ?Pain Intervention(s): Limited activity within patient's tolerance, Monitored during session  ? ? ?Home Living   ?  ?  ?  ?  ?  ?  ?  ?  ?  ?   ?  ?Prior Function    ?  ?  ?   ? ?PT Goals (current goals can now be found in the care plan section) Progress towards PT goals: Progressing toward goals ? ?  ?Frequency ? ? ? Min 2X/week ? ? ? ?  ?PT Plan Current plan remains appropriate  ? ? ?Co-evaluation   ?  ?  ?  ?  ? ?  ?  AM-PAC PT "6 Clicks" Mobility   ?Outcome Measure ? Help needed turning from your back to your side while in a flat bed without using bedrails?: None ?Help needed moving from lying on your back to sitting on the side of a flat bed without using bedrails?: None ?Help needed moving to and from a bed to a chair (including a wheelchair)?: None ?Help needed standing up from a chair using your arms (e.g., wheelchair or bedside chair)?: None ?Help needed to walk in hospital room?: None ?Help needed climbing 3-5 steps with a railing? : None ?6 Click Score: 24 ? ?  ?End of Session Equipment Utilized During Treatment: Gait belt ?Activity Tolerance: Patient tolerated treatment well ?Patient left: in bed;with call bell/phone within reach (sitting EOB) ?  ?PT Visit Diagnosis: Difficulty in walking, not elsewhere  classified (R26.2);Other abnormalities of gait and mobility (R26.89);History of falling (Z91.81) ?  ? ? ?Time: 9794-8016 ?PT Time Calculation (min) (ACUTE ONLY): 15 min ? ?Charges:  $Gait Training: 8-22 mins          ? ? ? ? ?Doreatha Massed, PT ?Acute Rehabilitation  ?Office: [phone] ?Pager: [phone] ? ?  ? ?

## 2021-04-29 NOTE — Progress Notes (Addendum)
Subjective: ?2 Days Post-Op Procedure(s) (LRB): ?IRRIGATION AND DEBRIDEMENT OF HEMATOMA AND ABCESS ,ASPIRATE RIGHT KNEE (Right) ?Patient reports pain as mild.  Patient reports more pain today.  He states that he started noticing an increase in pain once he switched him from IV antibiotics to p.o. antibiotics.  He states he is unable to lift his leg as high.  Denies any fevers chills.  Overall doing better still than prior to surgery. ? ?Objective: ?Vital signs in last 24 hours: ?Temp:  [98 ?F (36.7 ?C)-99.2 ?F (37.3 ?C)] 98 ?F (36.7 ?C) (04/20 0547) ?Pulse Rate:  [58-76] 58 (04/20 0547) ?Resp:  [16-20] 16 (04/20 0547) ?BP: (126-144)/(77-84) 144/84 (04/20 0547) ?SpO2:  [91 %-97 %] 91 % (04/20 0547) ? ?Intake/Output from previous day: ?04/19 0701 - 04/20 0700 ?In: 1080 [P.O.:1080] ?Out: 0  ?Intake/Output this shift: ?Total I/O ?In: 600 [P.O.:600] ?Out: 0  ? ?Recent Labs  ?  04/26/21 ?1421  ?HGB 11.4*  ? ?Recent Labs  ?  04/26/21 ?1421  ?WBC 8.2  ?RBC 3.91*  ?HCT 34.8*  ?PLT 270  ? ?Recent Labs  ?  04/26/21 ?1421  ?NA 139  ?K 3.7  ?CL 104  ?CO2 27  ?BUN 16  ?CREATININE 0.94  ?GLUCOSE 106*  ?CALCIUM 9.0  ? ?No results for input(s): LABPT, INR in the last 72 hours. ? ?Neurologically intact ?Neurovascular intact ?Sensation intact distally ?Intact pulses distally ?Dorsiflexion/Plantar flexion intact ?Incision: dressing C/D/I ?No cellulitis present ?Compartment soft ? ?Labs: culture still pending ? ?Assessment/Plan: ?2 Days Post-Op Procedure(s) (LRB): ?IRRIGATION AND DEBRIDEMENT OF HEMATOMA AND ABCESS ,ASPIRATE RIGHT KNEE (Right) ?Advance diet ?Patient is going to be going home today ?New dressings placed this morning leave those on until his follow-up with [place] in the office on Monday ?He is going to start aspirin 81 mg for DVT prevention starting tomorrow ?He is okay to weight-bear as tolerated ?Antibiotics is going to be according to infectious disease looks like he be going home on p.o. cefadroxil 1 gram  BID ? ? ?Anticipated LOS equal to or greater than 2 midnights due to ?- Age 76 and older with one or more of the following: ? - Obesity ? - Expected need for hospital services (PT, OT, Nursing) required for safe  discharge ? - Anticipated need for postoperative skilled nursing care or inpatient rehab ? - Active co-morbidities: None ?OR  ? ?- Unanticipated findings during/Post Surgery: None  ?- Patient is a high risk of re-admission due to: None ? ? ?Drue Novel, PA-C ?[facility] ?04/29/2021, 6:38 AM ? ?

## 2021-04-29 NOTE — Discharge Summary (Signed)
Physician Discharge Summary  ?Patient ID: ?Travis Myers ?MRN: [id number] ?DOB/AGE: Feb 03, 1955 66 y.o. ? ?Admit date: 04/26/2021 ?Discharge date: 04/29/2021 ? ?Admission Diagnoses: ?Right knee infected hematoma ? ?Discharge Diagnoses:  ?Principal Problem: ?  Infection of right knee ([place]) ?Active Problems: ?  Right knee hematoma due to trama ? ? ?Discharged Condition: good ? ?Hospital Course: Patient was admitted on April 17 due to a possible right leg hematoma that is possibly infected.  Patient was having a lot of pain in this right leg prior to admission in the office.  He was admitted on Monday night.  Tuesday was taken to the operating room for a I&D and aspiration.  Right knee joint was aspirated during surgery as well as a right knee hematoma I&D was done drain was placed.  Patient was sent to PACU in stable condition.  Postop day 1 patient doing very well, minimal pain in the leg at this time.  He was getting IV antibiotics per infectious disease.  Overall doing great.  Cultures are still coming back negative at this time he was switched to p.o. antibiotics.  Postop day 2 patient states that he is having a little bit more pain today than he was yesterday.  He noticed the increase in pain right after the switch from IV antibiotics to p.o. antibiotics.  Overall doing well.  Dressings changed today Penrose drain advanced about 3 to 4 inches.  New dressings placed.  Patient is okay to be discharged home today.  He will be discharged home on p.o. antibiotics per infectious disease recommendation.  He will follow-up in my office on Monday.  He is can a follow-up in infectious disease office in 2 weeks.  He was sent home with pain medication, nausea medication, antibiotics as well as muscle relaxer.  He is going to do aspirin 81 mg twice daily for DVT prevention. ? ?Consults: ID ? ?Significant Diagnostic Studies: labs: WNL and microbiology: wound culture: cultures still pending but as of now no  growth ? ?Treatments: IV hydration, antibiotics: IV cefazolin switched to PO cefadroxil 1 g BID, and surgery: Right knee I and D  ? ?Discharge Exam: ?Blood pressure (!) 144/84, pulse (!) 58, temperature 98 ?F (36.7 ?C), temperature source Oral, resp. rate 16, height '6\' 4"'$  (1.93 m), weight (!) 136.1 kg, SpO2 91 %. ?General appearance: alert, cooperative, appears stated age, and no distress ?Extremities: extremities normal, atraumatic, no cyanosis or edema and Homans sign is negative, no sign of DVT ?Pulses: 2+ and symmetric ?Skin: Skin color, texture, turgor normal. No rashes or lesions ?Neurologic: Grossly normal ?Incision/Wound: C/D/I ? ?Disposition: Discharge disposition: 01-Home or Self Care ? ? ? ? ? ? ?Discharge Instructions   ? ? Call MD / Call 911   Complete by: As directed ?  ? If you experience chest pain or shortness of breath, CALL 911 and be transported to the hospital emergency room.  If you develope a fever above 101 F, pus (white drainage) or increased drainage or redness at the wound, or calf pain, call your surgeon's office.  ? Constipation Prevention   Complete by: As directed ?  ? Drink plenty of fluids.  Prune juice may be helpful.  You may use a stool softener, such as Colace (over the counter) 100 mg twice a day.  Use MiraLax (over the counter) for constipation as needed.  ? Diet - low sodium heart healthy   Complete by: As directed ?  ? Discharge instructions   Complete by:  As directed ?  ? Weight bearing as tolerated ?ASA 81 mg BID for 6 weeks for DVT ppx ?Leave dressings on and dry until follow up in the office ?Follow up in the office on Monday ?Patient needs to call to make appointment for Monday ?Ice and elevate, take it easy over the weekend  ? Increase activity slowly as tolerated   Complete by: As directed ?  ? Post-operative opioid taper instructions:   Complete by: As directed ?  ? POST-OPERATIVE OPIOID TAPER INSTRUCTIONS: ?It is important to wean off of your opioid medication as  soon as possible. If you do not need pain medication after your surgery it is ok to stop day one. ?Opioids include: ?Codeine, Hydrocodone(Norco, Vicodin), Oxycodone(Percocet, oxycontin) and hydromorphone amongst others.  ?Long term and even short term use of opiods can cause: ?Increased pain response ?Dependence ?Constipation ?Depression ?Respiratory depression ?And more.  ?Withdrawal symptoms can include ?Flu like symptoms ?Nausea, vomiting ?And more ?Techniques to manage these symptoms ?Hydrate well ?Eat regular healthy meals ?Stay active ?Use relaxation techniques(deep breathing, meditating, yoga) ?Do Not substitute Alcohol to help with tapering ?If you have been on opioids for less than two weeks and do not have pain than it is ok to stop all together.  ?Plan to wean off of opioids ?This plan should start within one week post op of your joint replacement. ?Maintain the same interval or time between taking each dose and first decrease the dose.  ?Cut the total daily intake of opioids by one tablet each day ?Next start to increase the time between doses. ?The last dose that should be eliminated is the evening dose.  ? ?  ? ?  ? ?Allergies as of 04/29/2021   ? ?   Reactions  ? Penicillins Other (See Comments)  ? Childhood reaction- not recalled ?Has patient had a PCN reaction causing immediate rash, facial/tongue/throat swelling, SOB or lightheadedness with hypotension:unsure ?Has patient had a PCN reaction causing severe rash involving mucus membranes or skin necrosis:unsure ?Has patient had a PCN reaction that required hospitalization:unsure ?Has patient had a PCN reaction occurring within the last 10 years:No ?If all of the above answers are "NO", then may proceed with Cephalosporin use.  ? Quinolones Other (See Comments)  ? Patient was warned about not using Cipro and similar antibiotics. ?Recent studies have raised concern that fluoroquinolone antibiotics could be associated with an increased risk of aortic  aneurysm ?Fluoroquinolones have non-antimicrobial properties that might jeopardise the integrity of the extracellular matrix of the vascular wall ?In a  propensity score matched cohort study in [place], there was a 66% increased rate of aortic aneurysm or dissection associated with oral fluoroquinolone use, compared wit  ? ?  ? ?  ?Medication List  ?  ? ?TAKE these medications   ? ?acetaminophen 500 MG tablet ?Commonly known as: TYLENOL ?Take 500 mg by mouth See admin instructions. Take 500 mg by mouth every 4-5 hours as needed for pain ?  ?amLODipine 10 MG tablet ?Commonly known as: NORVASC ?Take 10 mg by mouth daily. ?  ?aspirin EC 81 MG tablet ?Take 81 mg by mouth daily. Swallow whole. ?What changed: Another medication with the same name was added. Make sure you understand how and when to take each. ?  ?aspirin EC 81 MG tablet ?Take 1 tablet (81 mg total) by mouth in the morning and at bedtime. Swallow whole. ?What changed: You were already taking a medication with the same name, and this prescription was added.  Make sure you understand how and when to take each. ?  ?atorvastatin 40 MG tablet ?Commonly known as: LIPITOR ?Take 1 tablet (40 mg total) by mouth daily. ?  ?cefadroxil 500 MG capsule ?Commonly known as: DURICEF ?Take 2 capsules (1,000 mg total) by mouth 2 (two) times daily for 14 days. ?  ?furosemide 20 MG tablet ?Commonly known as: LASIX ?Take 40 mg by mouth in the morning. ?  ?lisinopril 40 MG tablet ?Commonly known as: ZESTRIL ?Take 40 mg by mouth daily. ?  ?meclizine 25 MG tablet ?Commonly known as: ANTIVERT ?Take 25 mg by mouth daily. ?  ?meloxicam 15 MG tablet ?Commonly known as: MOBIC ?Take 15 mg by mouth daily. ?  ?methocarbamol 500 MG tablet ?Commonly known as: ROBAXIN ?Take 1 tablet (500 mg total) by mouth every 6 (six) hours as needed for muscle spasms. ?  ?ondansetron 4 MG tablet ?Commonly known as: ZOFRAN ?Take 1 tablet (4 mg total) by mouth every 6 (six) hours as needed for nausea. ?   ?oxyCODONE 5 MG immediate release tablet ?Commonly known as: Roxicodone ?Take 1 tablet (5 mg total) by mouth every 4 (four) hours as needed for severe pain. ?  ?traMADol 50 MG tablet ?Commonly known as: ULTRAM ?Take 100 mg by m

## 2021-04-29 NOTE — Plan of Care (Signed)
  Problem: Pain Managment: Goal: General experience of comfort will improve Outcome: Progressing   Problem: Safety: Goal: Ability to remain free from injury will improve Outcome: Progressing   

## 2021-04-29 NOTE — Plan of Care (Signed)
Plan of care reviewed and discussed with the patient. 

## 2021-05-01 LAB — BODY FLUID CULTURE W GRAM STAIN: Culture: NO GROWTH

## 2021-05-01 LAB — CULTURE, BLOOD (ROUTINE X 2)
Culture: NO GROWTH
Culture: NO GROWTH
Special Requests: ADEQUATE
Special Requests: ADEQUATE

## 2021-05-11 ENCOUNTER — Other Ambulatory Visit: Payer: Self-pay

## 2021-05-11 ENCOUNTER — Ambulatory Visit (INDEPENDENT_AMBULATORY_CARE_PROVIDER_SITE_OTHER): Payer: BC Managed Care – PPO | Admitting: Internal Medicine

## 2021-05-11 ENCOUNTER — Encounter: Payer: Self-pay | Admitting: Internal Medicine

## 2021-05-11 VITALS — BP 114/74 | HR 80 | Resp 16 | Ht 76.0 in | Wt 306.8 lb

## 2021-05-11 DIAGNOSIS — T148XXA Other injury of unspecified body region, initial encounter: Secondary | ICD-10-CM | POA: Diagnosis not present

## 2021-05-11 DIAGNOSIS — L089 Local infection of the skin and subcutaneous tissue, unspecified: Secondary | ICD-10-CM | POA: Diagnosis not present

## 2021-05-11 NOTE — Patient Instructions (Signed)
Please finish the antibiotic course ? ?The wound area looks good ? ? ?Once you stop antibiotics, monitor for sign of recurrent infection --> increased pain, redness, swelling, and purulence ? ?Wound care per your surgeon's instruction ?

## 2021-05-11 NOTE — Progress Notes (Signed)
?  ? ? ? ? ?[facility] ? ?Patient Active Problem List  ? Diagnosis Date Noted  ? Infection of right knee ([place]) 04/26/2021  ? Infected hematoma   ? Osteoarthritis of right knee 02/26/2020  ? Aortic regurgitation 07/26/2018  ? Spinal stenosis of lumbar region 02/24/2016  ? HNP (herniated nucleus pulposus), lumbar 02/24/2016  ? Obese 03/13/2014  ? S/P right THA, AA 03/11/2014  ? Preoperative clearance 02/28/2014  ? Thoracic aortic aneurysm ([place]) 03/14/2013  ? Essential hypertension 03/14/2013  ? Hyperlipidemia 03/14/2013  ? ? ? ? ?Subjective:  ? ? Patient ID: Travis Myers, male    DOB: [birth year], 66 y.o.   MRN: [id number] ? ?Chief Complaint  ?Patient presents with  ? Follow-up  ?  Rt Knee infection   ? ?Cc -- hospital follow up for right knee area infected hematoma ?HPI: ? ?Travis Myers is a 66 y.o. male hx right knee tka 02/2020, who suffered a fall resulting in an infected hematoma in proximity to the joint ? ?I saw him in the hospital during 04/28/21 admission. Joint sampling was negative; I&D done. Dr Theda Sers didn't see surgical evidence of joint involvement ? ?Prior to admission he had IR aspirated the hematoma and that grew staph lugdenensis ? ?His 4/18 joint aspirate cx still remains negative. He didn't have operative culture sent from the hematoma. ? ?He is doing much better today but still mild-mod pain in the right LE around the surgical site ?No n/v/diarrhea ? ? ?He has about 3 days of abx to go ? ? ? ?Allergies  ?Allergen Reactions  ? Penicillins Other (See Comments)  ?  Childhood reaction- not recalled ?Has patient had a PCN reaction causing immediate rash, facial/tongue/throat swelling, SOB or lightheadedness with hypotension:unsure ?Has patient had a PCN reaction causing severe rash involving mucus membranes or skin necrosis:unsure ?Has patient had a PCN reaction that required hospitalization:unsure ?Has patient had a PCN reaction occurring within the last 10  years:No ?If all of the above answers are "NO", then may proceed with Cephalosporin use. ?  ? Quinolones Other (See Comments)  ?  Patient was warned about not using Cipro and similar antibiotics. ?Recent studies have raised concern that fluoroquinolone antibiotics could be associated with an increased risk of aortic aneurysm ?Fluoroquinolones have non-antimicrobial properties that might jeopardise the integrity of the extracellular matrix of the vascular wall ?In a  propensity score matched cohort study in [place], there was a 66% increased rate of aortic aneurysm or dissection associated with oral fluoroquinolone use, compared wit  ? ? ? ? ?Outpatient Medications Prior to Visit  ?Medication Sig Dispense Refill  ? acetaminophen (TYLENOL) 500 MG tablet Take 500 mg by mouth See admin instructions. Take 500 mg by mouth every 4-5 hours as needed for pain    ? amLODipine (NORVASC) 10 MG tablet Take 10 mg by mouth daily.    ? aspirin EC 81 MG tablet Take 1 tablet (81 mg total) by mouth in the morning and at bedtime. Swallow whole. 84 tablet 0  ? atorvastatin (LIPITOR) 40 MG tablet Take 1 tablet (40 mg total) by mouth daily. 90 tablet 3  ? cefadroxil (DURICEF) 500 MG capsule Take 2 capsules (1,000 mg total) by mouth 2 (two) times daily for 14 days. 56 capsule 0  ? furosemide (LASIX) 20 MG tablet Take 40 mg by mouth in the morning.  5  ? lisinopril (PRINIVIL,ZESTRIL) 40 MG tablet Take 40 mg by mouth daily.  5  ? meclizine (ANTIVERT) 25 MG tablet Take 25 mg by mouth daily.  0  ? meloxicam (MOBIC) 15 MG tablet Take 15 mg by mouth daily.    ? traMADol (ULTRAM) 50 MG tablet Take 100 mg by mouth See admin instructions. Take 100 mg by mouth every 4-5 hours as needed for pain    ? traZODone (DESYREL) 50 MG tablet Take 50 mg by mouth at bedtime.    ? triamcinolone (KENALOG) 0.1 % Apply 1 application topically 2 (two) times daily as needed (dry skin).    ? methocarbamol (ROBAXIN) 500 MG tablet Take 1 tablet (500 mg total) by mouth  every 6 (six) hours as needed for muscle spasms. (Patient not taking: Reported on 05/11/2021) 60 tablet 0  ? ondansetron (ZOFRAN) 4 MG tablet Take 1 tablet (4 mg total) by mouth every 6 (six) hours as needed for nausea. (Patient not taking: Reported on 05/11/2021) 20 tablet 0  ? oxyCODONE (ROXICODONE) 5 MG immediate release tablet Take 1 tablet (5 mg total) by mouth every 4 (four) hours as needed for severe pain. (Patient not taking: Reported on 05/11/2021) 40 tablet 0  ? aspirin EC 81 MG tablet Take 81 mg by mouth daily. Swallow whole.    ? ?No facility-administered medications prior to visit.  ? ? ? ?Social History  ? ?Socioeconomic History  ? Marital status: Married  ?  Spouse name: Not on file  ? Number of children: Not on file  ? Years of education: Not on file  ? Highest education level: Not on file  ?Occupational History  ? Not on file  ?Tobacco Use  ? Smoking status: Never  ? Smokeless tobacco: Former  ?  Quit date: 09/11/1988  ?Vaping Use  ? Vaping Use: Never used  ?Substance and Sexual Activity  ? Alcohol use: Yes  ?  Alcohol/week: 1.0 standard drink  ?  Types: 1 Standard drinks or equivalent per week  ?  Comment: RARE  ? Drug use: No  ? Sexual activity: Yes  ?Other Topics Concern  ? Not on file  ?Social History Narrative  ? Not on file  ? ?Social Determinants of Health  ? ?Financial Resource Strain: Not on file  ?Food Insecurity: Not on file  ?Transportation Needs: Not on file  ?Physical Activity: Not on file  ?Stress: Not on file  ?Social Connections: Not on file  ?Intimate Partner Violence: Not on file  ? ? ? ? ?Review of Systems ?   ?All other ros negative ? ? ?Objective:  ?  ?Resp 16   Ht '6\' 4"'$  (1.93 m)   Wt (!) 306 lb 12.8 oz (139.2 kg)   BMI 37.34 kg/m?  ?Nursing note and vital signs reviewed. ? ?Physical Exam ? ?   ? ?Right LE redness resolved, minimal tendernss around the incision (left open and not closed yet 1 cm in length); no purulence; slight warmth; no cluctuance ?Right knee full  rom ?Alert/oriented ?No distress ?Normal respiratory effort ?Abd s/nt ?Ext trace edema to knees bilaterally ? ? ?Labs: ? ?Micro: ? ?Serology: ? ?Imaging: ? ?Assessment & Plan:  ? ?Problem List Items Addressed This Visit   ? ?  ? Other  ? Infected hematoma - Primary  ? ? ? ? ?No orders of the defined types were placed in this encounter. ? ? ? ?Clinically doing well ?Slight evidence of inflammation still but doesn't appear active infection. Will be more certain once he comes off abx ? ? ?-monitor for sign  of recurrent infection ?-finish 3 more days of abx ?-f/u as needed ? ? ? ?Follow-up: Return if symptoms worsen or fail to improve. ? ? ?I have spent a total of 20 minutes of face-to-face and non-face-to-face time, excluding clinical staff time, preparing to see patient, ordering tests and/or medications, and provide counseling the patient ? ? ? ? ?Jabier Mutton, MD ?[facility] ?[place] ?[phone]  pager   [phone] cell ?05/11/2021, 11:05 AM ? ?

## 2021-05-19 ENCOUNTER — Other Ambulatory Visit: Payer: Self-pay

## 2021-05-19 ENCOUNTER — Telehealth: Payer: Self-pay

## 2021-05-19 DIAGNOSIS — M7989 Other specified soft tissue disorders: Secondary | ICD-10-CM

## 2021-05-19 NOTE — Telephone Encounter (Signed)
Pt called with c/o RLE worsening pain and swelling. He has been seen by Dr. Theda Sers at [facility] who suggested he f/u here. Pt reports healing well since his R LA in January 2023 until he fell on his R knee in tub and developed a hematoma that an I & D was then performed and infected. He states pain is becoming unbearable. Pt has been scheduled for a DVT study and MD appt., and is aware of these appts.  ?

## 2021-05-20 ENCOUNTER — Ambulatory Visit (HOSPITAL_COMMUNITY)
Admission: RE | Admit: 2021-05-20 | Discharge: 2021-05-20 | Disposition: A | Discharge status: Home / Self Care | Payer: BC Managed Care – PPO | Source: Ambulatory Visit | Attending: Vascular Surgery | Admitting: Vascular Surgery

## 2021-05-20 ENCOUNTER — Other Ambulatory Visit: Payer: Self-pay

## 2021-05-20 DIAGNOSIS — I872 Venous insufficiency (chronic) (peripheral): Secondary | ICD-10-CM | POA: Diagnosis present

## 2021-05-20 DIAGNOSIS — M7989 Other specified soft tissue disorders: Secondary | ICD-10-CM

## 2021-05-21 ENCOUNTER — Ambulatory Visit (INDEPENDENT_AMBULATORY_CARE_PROVIDER_SITE_OTHER): Payer: BC Managed Care – PPO | Admitting: Vascular Surgery

## 2021-05-21 ENCOUNTER — Encounter: Payer: Self-pay | Admitting: Vascular Surgery

## 2021-05-21 VITALS — BP 133/71 | HR 82 | Temp 98.6°F | Resp 18 | Ht 76.0 in | Wt 303.0 lb

## 2021-05-21 DIAGNOSIS — I872 Venous insufficiency (chronic) (peripheral): Secondary | ICD-10-CM | POA: Diagnosis not present

## 2021-05-21 NOTE — Progress Notes (Signed)
? ? ?REASON FOR VISIT:  ? ?Pain and swelling right leg ? ?MEDICAL ISSUES:  ? ?CHRONIC VENOUS INSUFFICIENCY: I think the swelling could be combined chronic venous insufficiency and lymphedema.  He has not been elevating his leg correctly and we have discussed the proper position for leg elevation.  Unfortunately he works and is on his feet quite a bit so this is certainly contributing.  Once the swelling is under better control he can continue to wear his compression stocking.  His venous duplex scan yesterday did not show any evidence of DVT.  The proximal saphenous vein is open but the remainder of the vein is successfully ablated.  This is not changed compared to the study previously.  I do not think this is in any way contributing to his swelling. ? ? ?HPI:  ? ?Travis Myers is a pleasant 66 y.o. male with a history of CEAP C4b venous disease.  He had failed conservative treatment and was felt to be a good candidate for laser ablation of the right great saphenous vein.  On 12/30/2020 he underwent laser ablation of the right great saphenous vein to the proximal calf.  Of note the vein became superficial in the mid thigh.  On his follow-up visit 2 weeks out he told us that his leg felt better almost instantly after the procedure and the swelling in his right leg improved significantly.  His duplex scan showed successful closure of his right great saphenous vein down to the proximal calf.  The proximal segment of the vein was patent. ? ?On my history the patient fell in his bathtub and developed a large hematoma on the lateral aspect of his right proximal leg.  Ultimately this was drained that he got a staph infection.  He has a prosthetic knee and this is not infected fortunately.  He had significant leg swelling which is finally improving somewhat.  He wanted to come in and be reevaluated. ? ?Past Medical History:  ?Diagnosis Date  ? Acute meniscal tear of knee left  ? Aneurysm, ascending aorta (HCC)  4.8cm per ct 2011/ CARDIOLOGSIT--  DR Rollene Fare  LOV  10-07-2011  NOTE W/ CHART AND REQUEST LEXISCAN MYOVIEW AND ECHO TO BE FAXED.  ? ASYMPTOMATIC  ? Heart murmur MILD-- ASYMPTOMATIC  ? Hyperlipidemia   ? Hypertension   ? OA (osteoarthritis) of knee left  ? OSA (obstructive sleep apnea) NON-TOLERANT CPAP  ? no cpap PT REPORTS NEVER FINISHED TEST   ? Pre-diabetes   ? Sleep apnea   ? Tinnitus of both ears   ? ? ?Family History  ?Problem Relation Age of Onset  ? Diabetes Mother   ? Cancer Mother   ? Heart failure Other   ? Diabetes Other   ? Cancer Other   ? COPD Other   ? Colon cancer Neg Hx   ? Stomach cancer Neg Hx   ? ? ?SOCIAL HISTORY: ?Social History  ? ?Tobacco Use  ? Smoking status: Never  ? Smokeless tobacco: Former  ?  Quit date: 09/11/1988  ?Substance Use Topics  ? Alcohol use: Yes  ?  Alcohol/week: 1.0 standard drink  ?  Types: 1 Standard drinks or equivalent per week  ?  Comment: RARE  ? ? ?Allergies  ?Allergen Reactions  ? Penicillins Other (See Comments)  ?  Childhood reaction- not recalled ?Has patient had a PCN reaction causing immediate rash, facial/tongue/throat swelling, SOB or lightheadedness with hypotension:unsure ?Has patient had a PCN reaction causing severe rash  involving mucus membranes or skin necrosis:unsure ?Has patient had a PCN reaction that required hospitalization:unsure ?Has patient had a PCN reaction occurring within the last 10 years:No ?If all of the above answers are "NO", then may proceed with Cephalosporin use. ?  ? Quinolones Other (See Comments)  ?  Patient was warned about not using Cipro and similar antibiotics. ?Recent studies have raised concern that fluoroquinolone antibiotics could be associated with an increased risk of aortic aneurysm ?Fluoroquinolones have non-antimicrobial properties that might jeopardise the integrity of the extracellular matrix of the vascular wall ?In a  propensity score matched cohort study in [place], there was a 66% increased rate of aortic  aneurysm or dissection associated with oral fluoroquinolone use, compared wit  ? ? ?Current Outpatient Medications  ?Medication Sig Dispense Refill  ? acetaminophen (TYLENOL) 500 MG tablet Take 500 mg by mouth See admin instructions. Take 500 mg by mouth every 4-5 hours as needed for pain    ? amLODipine (NORVASC) 10 MG tablet Take 10 mg by mouth daily.    ? aspirin EC 81 MG tablet Take 1 tablet (81 mg total) by mouth in the morning and at bedtime. Swallow whole. 84 tablet 0  ? furosemide (LASIX) 20 MG tablet Take 40 mg by mouth in the morning.  5  ? lisinopril (PRINIVIL,ZESTRIL) 40 MG tablet Take 40 mg by mouth daily.  5  ? meclizine (ANTIVERT) 25 MG tablet Take 25 mg by mouth daily.  0  ? meloxicam (MOBIC) 15 MG tablet Take 15 mg by mouth daily.    ? traMADol (ULTRAM) 50 MG tablet Take 100 mg by mouth See admin instructions. Take 100 mg by mouth every 4-5 hours as needed for pain    ? traZODone (DESYREL) 50 MG tablet Take 50 mg by mouth at bedtime.    ? triamcinolone (KENALOG) 0.1 % Apply 1 application topically 2 (two) times daily as needed (dry skin).    ? atorvastatin (LIPITOR) 40 MG tablet Take 1 tablet (40 mg total) by mouth daily. 90 tablet 3  ? methocarbamol (ROBAXIN) 500 MG tablet Take 1 tablet (500 mg total) by mouth every 6 (six) hours as needed for muscle spasms. (Patient not taking: Reported on 05/21/2021) 60 tablet 0  ? ondansetron (ZOFRAN) 4 MG tablet Take 1 tablet (4 mg total) by mouth every 6 (six) hours as needed for nausea. (Patient not taking: Reported on 05/21/2021) 20 tablet 0  ? oxyCODONE (ROXICODONE) 5 MG immediate release tablet Take 1 tablet (5 mg total) by mouth every 4 (four) hours as needed for severe pain. (Patient not taking: Reported on 05/21/2021) 40 tablet 0  ? ?No current facility-administered medications for this visit.  ? ? ?REVIEW OF SYSTEMS:  ?'[X]'$  denotes positive finding, '[ ]'$  denotes negative finding ?Cardiac  Comments:  ?Chest pain or chest pressure:    ?Shortness of breath  upon exertion:    ?Short of breath when lying flat:    ?Irregular heart rhythm:    ?    ?Vascular    ?Pain in calf, thigh, or hip brought on by ambulation:    ?Pain in feet at night that wakes you up from your sleep:     ?Blood clot in your veins:    ?Leg swelling:  x   ?    ?Pulmonary    ?Oxygen at home:    ?Productive cough:     ?Wheezing:     ?    ?Neurologic    ?Sudden weakness in  arms or legs:     ?Sudden numbness in arms or legs:     ?Sudden onset of difficulty speaking or slurred speech:    ?Temporary loss of vision in one eye:     ?Problems with dizziness:     ?    ?Gastrointestinal    ?Blood in stool:     ?Vomited blood:     ?    ?Genitourinary    ?Burning when urinating:     ?Blood in urine:    ?    ?Psychiatric    ?Major depression:     ?    ?Hematologic    ?Bleeding problems:    ?Problems with blood clotting too easily:    ?    ?Skin    ?Rashes or ulcers:    ?    ?Constitutional    ?Fever or chills:    ? ?PHYSICAL EXAM:  ? ?Vitals:  ? 05/21/21 1357  ?BP: 133/71  ?Pulse: 82  ?Resp: 18  ?Temp: 98.6 ?F (37 ?C)  ?TempSrc: Temporal  ?SpO2: 97%  ?Weight: (!) 303 lb (137.4 kg)  ?Height: '6\' 4"'$  (1.93 m)  ? ?GENERAL: The patient is a well-nourished male, in no acute distress. The vital signs are documented above. ?VASCULAR: The right foot is warm and well-perfused. ?He has moderate right lower extremity swelling. ?SKIN: There are no ulcers or rashes noted. ?PSYCHIATRIC: The patient has a normal affect. ? ?DATA:   ? ?VENOUS DUPLEX: I have independently interpreted the venous duplex scan that was done yesterday.  This shows no evidence of DVT in the right lower extremity.  There is incompetence of the great saphenous vein in the mid thigh and the vein is closed below that. ? ?Deitra Mayo ?[facility] ?Office [phone] ?

## 2021-06-03 ENCOUNTER — Encounter: Payer: Self-pay | Admitting: Vascular Surgery

## 2021-07-12 ENCOUNTER — Telehealth: Payer: Self-pay

## 2021-07-12 NOTE — Telephone Encounter (Signed)
   Name: Travis Myers  DOB: 01/26/1955  MRN: [id number]  Primary Cardiologist: Quay Burow, MD  Chart reviewed as part of pre-operative protocol coverage. Because of PLEASANT BENSINGER II's past medical history and time since last visit, he will require a follow-up in-office visit in order to better assess preoperative cardiovascular risk.  Pre-op covering staff: - Please schedule appointment and call patient to inform them. If patient already had an upcoming appointment within acceptable timeframe, please add "pre-op clearance" to the appointment notes so provider is aware. - Please contact requesting surgeon's office via preferred method (i.e, phone, fax) to inform them of need for appointment prior to surgery.  Per office protocol, patient may hold Asprin for 7 days prior to procedure.   Lenna Sciara, NP  07/12/2021, 2:30 PM

## 2021-07-12 NOTE — Telephone Encounter (Signed)
   Pre-operative Risk Assessment    Patient Name: Travis Myers  DOB: [birth year] MRN: [id number]      Request for Surgical Clearance    Procedure:   Resection of Right Knee Arthroscopy and Placement of Antibiotic Spacer  Date of Surgery:  Clearance 07/22/21                                 Surgeon:  Dr. Paralee Cancel Surgeon's Group or Practice Name:  [facility] Phone number:  [phone] Fax number:  [phone]   Type of Clearance Requested:   - Pharmacy:  Hold Aspirin 7   Type of Anesthesia:  Spinal   Additional requests/questions:    Signed, Elsie Lincoln Nevaya Nagele   07/12/2021, 2:20 PM

## 2021-07-12 NOTE — Telephone Encounter (Signed)
Pt agreeable to plan of care for in office appt 07/14/21 @ 9:15 with Caron Presume, PAC. I will forward notes to [facility] for appt. Will send FYI to requesting office the pt has appt 07/14/21

## 2021-07-13 NOTE — Progress Notes (Unsigned)
Cardiology Office Note:    Date:  07/14/2021   ID:  Warner Mccreedy II, DOB 1955/02/01, MRN [id number]  PCP:  Cyndi Bender, [place] Cardiologist: Quay Burow, MD   Reason for visit: Clearance for right knee surgery 07/22/21 - Dr. Alvan Dame Fax number:  [phone]  History of Present Illness:    NYKOLAS BACALLAO II is a 66 y.o. male with a hx of hypertension, hyperlipidemia, thoracic aortic aneurysm, aortic insufficiency, chronic venous insufficiency followed by vascular and vein specialist.  He was last seen by Dr. Gwenlyn Found in June 2022 and was doing well.  Blood pressure was well controlled.  Lipitor was increased from 10 to 20 mg daily.  Note he saw Dr. Doren Custard in May 2023 for leg swelling thought to be combined chronic venous insufficiency and lymphedema.  Venous duplex negative for DVT.  Today, patient discusses current knee infection.  He is scheduled for right knee surgery July 22, 2021 with Dr. Alvan Dame with spacer insert.  He has had increased knee swelling and pain secondary to infection.  Otherwise, he states he is doing well from a cardiovascular standpoint.  He denies chest pain, shortness of breath, palpitations, dizziness, syncope, PND or orthopnea.  He states he has been diagnosed with sleep apnea and has a nasal CPAP he plans to try postop.  He states his blood pressure at home is low to mid 130s over 70s.  He denies any issues with his medications.       Past Medical History:  Diagnosis Date   Acute meniscal tear of knee left   Aneurysm, ascending aorta ([place]) 4.8cm per ct 2011/ CARDIOLOGSIT--  DR Rollene Fare  LOV  10-07-2011  NOTE W/ CHART AND REQUEST LEXISCAN MYOVIEW AND ECHO TO BE FAXED.   ASYMPTOMATIC   Heart murmur MILD-- ASYMPTOMATIC   Hyperlipidemia    Hypertension    OA (osteoarthritis) of knee left   OSA (obstructive sleep apnea) NON-TOLERANT CPAP   no cpap PT REPORTS NEVER FINISHED TEST    Pre-diabetes    Sleep apnea    Tinnitus of both  ears     Past Surgical History:  Procedure Laterality Date   BILATERAL IINGUINAL HERNIA REPAIR  1963   DOPPLER ECHOCARDIOGRAPHY  10/18/2011   EF >55 %   ENDOVENOUS ABLATION SAPHENOUS VEIN W/ LASER Right 12/30/2020   endovenous laser ablation right greater saphenous vein by Gae Gallop MD   INCISION AND DRAINAGE OF WOUND Right 04/27/2021   Procedure: IRRIGATION AND DEBRIDEMENT OF HEMATOMA AND ABCESS ,ASPIRATE RIGHT KNEE;  Surgeon: Sydnee Cabal, MD;  Location: [facility];  Service: Orthopedics;  Laterality: Right;  REQUEST 3:30PM START TIME   LUMBAR LAMINECTOMY/DECOMPRESSION MICRODISCECTOMY N/A 02/24/2016   Procedure: Bilateral Decompression L4-5, Discectomy L4-5 ;  Surgeon: Susa Day, MD;  Location: [facility];  Service: Orthopedics;  Laterality: N/A;   NM MYOCAR PERF WALL MOTION  10/13/2011   EF 49 %,low risk study   RIGHT KNEE SURGERY X 2      ARTHROSCOPY    TONSILLECTOMY  1965   torn meniscus left knee surgery      TORN MENISCUS X 3 IN KNEES SURGERY      LEFT    TOTAL HIP ARTHROPLASTY  06/18/2009   OA LEFT HIP   TOTAL HIP ARTHROPLASTY Right 03/11/2014   Procedure: RIGHT TOTAL HIP ARTHROPLASTY ANTERIOR APPROACH;  Surgeon: [name], MD;  Location: [facility];  Service: Orthopedics;  Laterality: Right;   TOTAL KNEE ARTHROPLASTY Right 02/26/2020  Procedure: TOTAL KNEE ARTHROPLASTY;  Surgeon: Sydnee Cabal, MD;  Location: [facility];  Service: Orthopedics;  Laterality: Right;  adductor canal   TRANSTHORACIC ECHOCARDIOGRAM  09/11/2012   EF 55 to 60 %,mild aortic valve regurg,mild mitral valve regurg,lf and rt atriums mildly dilated   VARICOSE VEIN SURGERY Left 1980   veins removed from left leg  1994    Current Medications: Current Meds  Medication Sig   acetaminophen (TYLENOL) 500 MG tablet Take 500-1,000 mg by mouth every 6 (six) hours as needed for moderate pain.   amLODipine (NORVASC) 10 MG tablet Take 10 mg by mouth daily.   diclofenac (VOLTAREN) 75 MG EC tablet Take 75 mg by  mouth 2 (two) times daily.   furosemide (LASIX) 20 MG tablet Take 40 mg by mouth in the morning.   lisinopril (PRINIVIL,ZESTRIL) 40 MG tablet Take 40 mg by mouth daily.   meclizine (ANTIVERT) 25 MG tablet Take 25 mg by mouth 3 (three) times daily as needed for dizziness.   meloxicam (MOBIC) 15 MG tablet Take 15 mg by mouth daily.   pregabalin (LYRICA) 75 MG capsule Take 75 mg by mouth 2 (two) times daily.   traMADol (ULTRAM) 50 MG tablet Take 100 mg by mouth every 8 (eight) hours as needed for moderate pain.   traZODone (DESYREL) 50 MG tablet Take 50 mg by mouth at bedtime.   triamcinolone (KENALOG) 0.1 % Apply 1 application  topically daily.   vitamin B-12 (CYANOCOBALAMIN) 50 MCG tablet Take 50 mcg by mouth daily.   vitamin C (ASCORBIC ACID) 500 MG tablet Take 500 mg by mouth daily.     Allergies:   Penicillins and Quinolones   Social History   Socioeconomic History   Marital status: Married    Spouse name: Not on file   Number of children: Not on file   Years of education: Not on file   Highest education level: Not on file  Occupational History   Not on file  Tobacco Use   Smoking status: Never   Smokeless tobacco: Former    Quit date: 09/11/1988  Vaping Use   Vaping Use: Never used  Substance and Sexual Activity   Alcohol use: Yes    Alcohol/week: 1.0 standard drink of alcohol    Types: 1 Standard drinks or equivalent per week    Comment: RARE   Drug use: No   Sexual activity: Yes  Other Topics Concern   Not on file  Social History Narrative   Not on file   Social Determinants of Health   Financial Resource Strain: Not on file  Food Insecurity: Not on file  Transportation Needs: Not on file  Physical Activity: Not on file  Stress: Not on file  Social Connections: Not on file     Family History: The patient's family history includes COPD in an other family member; Cancer in his mother and another family member; Diabetes in his mother and another family member;  Heart failure in an other family member. There is no history of Colon cancer or Stomach cancer.  ROS:   Please see the history of present illness.     EKGs/Labs/Other Studies Reviewed:    EKG:  The ekg ordered today demonstrates normal sinus rhythm, heart rate 65.  Recent Labs: 04/26/2021: ALT 14; BUN 16; Creatinine, Ser 0.94; Hemoglobin 11.4; Platelets 270; Potassium 3.7; Sodium 139   Recent Lipid Panel Lab Results  Component Value Date/Time   CHOL 168 09/04/2020 08:07 AM   TRIG 74  09/04/2020 08:07 AM   HDL 55 09/04/2020 08:07 AM   LDLCALC 99 09/04/2020 08:07 AM    Physical Exam:    VS:  BP 140/78   Pulse 65   Ht '6\' 4"'$  (1.93 m)   Wt 299 lb (135.6 kg)   SpO2 96%   BMI 36.40 kg/m    No data found.  Wt Readings from Last 3 Encounters:  07/14/21 299 lb (135.6 kg)  05/21/21 (!) 303 lb (137.4 kg)  05/11/21 (!) 306 lb 12.8 oz (139.2 kg)     GEN:  Well nourished, well developed in no acute distress HEENT: Normal NECK: No JVD; No carotid bruits CARDIAC: RRR, systolic murmur RESPIRATORY:  Clear to auscultation without rales, wheezing or rhonchi  ABDOMEN: Soft, non-tender, non-distended MUSCULOSKELETAL: Mild LE edema, wearing thigh-high compression stockings SKIN: Warm and dry NEUROLOGIC:  Alert and oriented PSYCHIATRIC:  Normal affect    ASSESSMENT AND PLAN   Preop clearance Mr. Landin perioperative risk of a major cardiac event is 0.4% according to the Revised Cardiac Risk Index (RCRI).  Therefore, he is at low risk for perioperative complications.   His functional capacity is fair at 5.62 METs according to the Duke Activity Status Index (DASI). Recommendations: According to ACC/AHA guidelines, no further cardiovascular testing needed.  The patient may proceed to surgery at acceptable risk.   Antiplatelet and/or Anticoagulation Recommendations: Patient is not on any blood thinners.  Thoracic aortic aneurysm, stable -CTA chest aorta 09/2020: Ascending aorta 4.8  cm (previously 4.8 on 03/14/2019) -Repeat CTA chest aorta ~3-6 months.  Patient wants to wait until CT scan to refer to Dr. Cyndia Bent to help follow TAA.   -Goal systolic blood pressure [id number]. -I recommended adding Bystolic 5 mg daily -patient wanted to hold off until postsurgery & recovery.  He also mentioned weight loss efforts particularly after he recovers from the surgery.  We could also evaluate the effect of CPAP treatment on blood pressure at follow-up in 6 months.    Aortic insufficiency -2D echo December 2022 with mild to moderate aortic regurgitation, mild aortic stenosis, normal EF -Recommend repeat 2D echo in December 2023  Hypertension, slightly elevated -Continue amlodipine and lisinopril -these are at max doses.  Creatinine & K normal in May 2023. -Goal BP 105-120 given TAA. Recommend DASH diet (high in vegetables, fruits, low-fat dairy products, whole grains, poultry, fish, and nuts and low in sweets, sugar-sweetened beverages, and red meats), salt restriction and increase physical activity.  Hyperlipidemia  -LDL 83 in May 2023.  Continue Lipitor.  Disposition - Follow-up in 6 months with Dr. Alvester Chou.   Medication Adjustments/Labs and Tests Ordered: Current medicines are reviewed at length with the patient today.  Concerns regarding medicines are outlined above.  Orders Placed This Encounter  Procedures   CT CORONARY MORPH W/CTA COR W/SCORE W/CA W/CM &/OR WO/CM   EKG 12-Lead   No orders of the defined types were placed in this encounter.   Patient Instructions  Medication Instructions:  No CHanges *If you need a refill on your cardiac medications before your next appointment, please call your pharmacy*   Lab Work: No Labs If you have labs (blood work) drawn today and your tests are completely normal, you will receive your results only by: [place] (if you have MyChart) OR A paper copy in the mail If you have any lab test that is abnormal or we need to  change your treatment, we will call you to review the results.   Testing/Procedures:  Your cardiac CT will be scheduled at one of the below locations:   [facility] [address] [place], [place] [address] [phone]   If scheduled at [facility], please arrive at the [facility] and Children's Entrance (Entrance C2) of [facility] 30 minutes prior to test start time. You can use the FREE valet parking offered at entrance C (encouraged to control the heart rate for the test)  Proceed to the [facility] Radiology Department (first floor) to check-in and test prep.  All radiology patients and guests should use entrance C2 at [facility], accessed from [facility], even though the hospital's physical address listed is [address]    If scheduled at [facility], please arrive 15 mins early for check-in and test prep.  Please follow these instructions carefully (unless otherwise directed):  Hold all erectile dysfunction medications at least 3 days (72 hrs) prior to test.  On the Night Before the Test: Be sure to Drink plenty of water. Do not consume any caffeinated/decaffeinated beverages or chocolate 12 hours prior to your test. Do not take any antihistamines 12 hours prior to your test. If the patient has contrast allergy: Patient will need a prescription for Prednisone and very clear instructions (as follows): Prednisone 50 mg - take 13 hours prior to test Take another Prednisone 50 mg 7 hours prior to test Take another Prednisone 50 mg 1 hour prior to test Take Benadryl 50 mg 1 hour prior to test Patient must complete all four doses of above prophylactic medications. Patient will need a ride after test due to Benadryl.  On the Day of the Test: Drink plenty of water until 1 hour prior to the test. Do not eat any food 4 hours prior to the test. You may take your regular medications prior to  the test.  Take metoprolol (Lopressor) two hours prior to test. HOLD Furosemide/Hydrochlorothiazide morning of the test.         After the Test: Drink plenty of water. After receiving IV contrast, you may experience a mild flushed feeling. This is normal. On occasion, you may experience a mild rash up to 24 hours after the test. This is not dangerous. If this occurs, you can take Benadryl 25 mg and increase your fluid intake. If you experience trouble breathing, this can be serious. If it is severe call 911 IMMEDIATELY. If it is mild, please call our office. If you take any of these medications: Glipizide/Metformin, Avandament, Glucavance, please do not take 48 hours after completing test unless otherwise instructed.  We will call to schedule your test 2-4 weeks out understanding that some insurance companies will need an authorization prior to the service being performed.   For non-scheduling related questions, please contact the cardiac imaging nurse navigator should you have any questions/concerns: Marchia Bond, Cardiac Imaging Nurse Navigator Gordy Clement, Cardiac Imaging Nurse Navigator [facility] Direct Office Dial: [phone]   For scheduling needs, including cancellations and rescheduling, please call [place], [phone].    Follow-Up: At [facility], you and your health needs are our priority.  As part of our continuing mission to provide you with exceptional heart care, we have created designated Provider Care Teams.  These Care Teams include your primary Cardiologist (physician) and Advanced Practice Providers (APPs -  Physician Assistants and Nurse Practitioners) who all work together to provide you with the care you need, when you need it.   Your  next appointment:   6 month(s)  The format for your next appointment:   In Person  Provider:   Quay Burow, MD       Important Information About Sugar          Signed, Warren Lacy, PA-C  07/14/2021 9:42 AM    [place]

## 2021-07-14 ENCOUNTER — Other Ambulatory Visit (HOSPITAL_COMMUNITY): Payer: Self-pay

## 2021-07-14 ENCOUNTER — Encounter: Payer: Self-pay | Admitting: Physician Assistant

## 2021-07-14 ENCOUNTER — Ambulatory Visit (INDEPENDENT_AMBULATORY_CARE_PROVIDER_SITE_OTHER): Payer: BC Managed Care – PPO | Admitting: Physician Assistant

## 2021-07-14 VITALS — BP 140/78 | HR 65 | Ht 76.0 in | Wt 299.0 lb

## 2021-07-14 DIAGNOSIS — I351 Nonrheumatic aortic (valve) insufficiency: Secondary | ICD-10-CM | POA: Diagnosis not present

## 2021-07-14 DIAGNOSIS — Z01818 Encounter for other preprocedural examination: Secondary | ICD-10-CM | POA: Diagnosis not present

## 2021-07-14 DIAGNOSIS — I712 Thoracic aortic aneurysm, without rupture, unspecified: Secondary | ICD-10-CM | POA: Diagnosis not present

## 2021-07-14 DIAGNOSIS — I10 Essential (primary) hypertension: Secondary | ICD-10-CM | POA: Diagnosis not present

## 2021-07-14 NOTE — Patient Instructions (Signed)
Medication Instructions:  No CHanges *If you need a refill on your cardiac medications before your next appointment, please call your pharmacy*   Lab Work: No Labs If you have labs (blood work) drawn today and your tests are completely normal, you will receive your results only by: [place] (if you have MyChart) OR A paper copy in the mail If you have any lab test that is abnormal or we need to change your treatment, we will call you to review the results.   Testing/Procedures:   Your cardiac CT will be scheduled at one of the below locations:   [facility] [address] [place], [place] [address] [phone]   If scheduled at [facility], please arrive at the [facility] and Children's Entrance (Entrance C2) of [facility] 30 minutes prior to test start time. You can use the FREE valet parking offered at entrance C (encouraged to control the heart rate for the test)  Proceed to the [facility] Radiology Department (first floor) to check-in and test prep.  All radiology patients and guests should use entrance C2 at [facility], accessed from [facility], even though the hospital's physical address listed is [address].    If scheduled at [facility], please arrive 15 mins early for check-in and test prep.  Please follow these instructions carefully (unless otherwise directed):  Hold all erectile dysfunction medications at least 3 days (72 hrs) prior to test.  On the Night Before the Test: Be sure to Drink plenty of water. Do not consume any caffeinated/decaffeinated beverages or chocolate 12 hours prior to your test. Do not take any antihistamines 12 hours prior to your test. If the patient has contrast allergy: Patient will need a prescription for Prednisone and very clear instructions (as follows): Prednisone 50 mg - take 13 hours prior to test Take another Prednisone 50 mg 7  hours prior to test Take another Prednisone 50 mg 1 hour prior to test Take Benadryl 50 mg 1 hour prior to test Patient must complete all four doses of above prophylactic medications. Patient will need a ride after test due to Benadryl.  On the Day of the Test: Drink plenty of water until 1 hour prior to the test. Do not eat any food 4 hours prior to the test. You may take your regular medications prior to the test.  Take metoprolol (Lopressor) two hours prior to test. HOLD Furosemide/Hydrochlorothiazide morning of the test.         After the Test: Drink plenty of water. After receiving IV contrast, you may experience a mild flushed feeling. This is normal. On occasion, you may experience a mild rash up to 24 hours after the test. This is not dangerous. If this occurs, you can take Benadryl 25 mg and increase your fluid intake. If you experience trouble breathing, this can be serious. If it is severe call 911 IMMEDIATELY. If it is mild, please call our office. If you take any of these medications: Glipizide/Metformin, Avandament, Glucavance, please do not take 48 hours after completing test unless otherwise instructed.  We will call to schedule your test 2-4 weeks out understanding that some insurance companies will need an authorization prior to the service being performed.   For non-scheduling related questions, please contact the cardiac imaging nurse navigator should you have any questions/concerns: Marchia Bond, Cardiac Imaging Nurse Navigator Gordy Clement, Cardiac Imaging Nurse Navigator [place] [facility] Direct Office  Dial: [phone]   For scheduling needs, including cancellations and rescheduling, please call [place], [phone].    Follow-Up: At [facility], you and your health needs are our priority.  As part of our continuing mission to provide you with exceptional heart care, we have created designated Provider Care Teams.  These Care  Teams include your primary Cardiologist (physician) and Advanced Practice Providers (APPs -  Physician Assistants and Nurse Practitioners) who all work together to provide you with the care you need, when you need it.   Your next appointment:   6 month(s)  The format for your next appointment:   In Person  Provider:   Quay Burow, MD       Important Information About Sugar

## 2021-07-19 NOTE — Progress Notes (Signed)
Pt. Needs orders for surgery. 

## 2021-07-19 NOTE — Patient Instructions (Signed)
DUE TO COVID-19 ONLY TWO VISITORS  (aged 68 and older)  ARE ALLOWED TO COME WITH YOU AND STAY IN THE WAITING ROOM ONLY DURING PRE OP AND PROCEDURE.   **NO VISITORS ARE ALLOWED IN THE SHORT STAY AREA OR RECOVERY ROOM!!**  IF YOU WILL BE ADMITTED INTO THE HOSPITAL YOU ARE ALLOWED ONLY FOUR SUPPORT PEOPLE DURING VISITATION HOURS ONLY (7 AM -8PM)   The support person(s) must pass our screening, gel in and out, and wear a mask at all times, including in the patient's room. Patients must also wear a mask when staff or their support person are in the room. Visitors GUEST BADGE MUST BE WORN VISIBLY  One adult visitor may remain with you overnight and MUST be in the room by 8 P.M.     Your procedure is scheduled on: 07/22/21   Report to [facility] Main Entrance    Report to admitting at : 8:30 AM   Call this number if you have problems the morning of surgery [phone]   Do not eat food :After Midnight.   After Midnight you may have the following liquids until : 8:00 AM DAY OF SURGERY  Water Black Coffee (sugar ok, NO MILK/CREAM OR CREAMERS)  Tea (sugar ok, NO MILK/CREAM OR CREAMERS) regular and decaf                             Plain Jell-O (NO RED)                                           Fruit ices (not with fruit pulp, NO RED)                                     Popsicles (NO RED)                                                                  Juice: apple, WHITE grape, WHITE cranberry Sports drinks like Gatorade (NO RED) Clear broth(vegetable,chicken,beef)   Oral Hygiene is also important to reduce your risk of infection.                                    Remember - BRUSH YOUR TEETH THE MORNING OF SURGERY WITH YOUR REGULAR TOOTHPASTE   Do NOT smoke after Midnight   Take these medicines the morning of surgery with A SIP OF WATER:   DO NOT TAKE ANY ORAL DIABETIC MEDICATIONS DAY OF YOUR SURGERY  Bring CPAP mask and tubing day of surgery.                               You may not have any metal on your body including hair pins, jewelry, and body piercing             Do not wear lotions, powders, perfumes/cologne, or deodorant  Men may shave face and neck.   Do not bring valuables to the hospital. [place].   Contacts, dentures or bridgework may not be worn into surgery.   Bring small overnight bag day of surgery.   DO NOT [place]. PHARMACY WILL DISPENSE MEDICATIONS LISTED ON YOUR MEDICATION LIST TO YOU DURING YOUR ADMISSION [place]!    Patients discharged on the day of surgery will not be allowed to drive home.  Someone NEEDS to stay with you for the first 24 hours after anesthesia.   Special Instructions: Bring a copy of your healthcare power of attorney and living will documents         the day of surgery if you haven't scanned them before.              Please read over the following fact sheets you were given: IF YOU HAVE QUESTIONS ABOUT YOUR PRE-OP INSTRUCTIONS PLEASE CALL [phone]     [facility] - Preparing for Surgery Before surgery, you can play an important role.  Because skin is not sterile, your skin needs to be as free of germs as possible.  You can reduce the number of germs on your skin by washing with CHG (chlorahexidine gluconate) soap before surgery.  CHG is an antiseptic cleaner which kills germs and bonds with the skin to continue killing germs even after washing. Please DO NOT use if you have an allergy to CHG or antibacterial soaps.  If your skin becomes reddened/irritated stop using the CHG and inform your nurse when you arrive at Short Stay. Do not shave (including legs and underarms) for at least 48 hours prior to the first CHG shower.  You may shave your face/neck. Please follow these instructions carefully:  1.  Shower with CHG Soap the night before surgery and the  morning of Surgery.  2.  If you choose to wash your  hair, wash your hair first as usual with your  normal  shampoo.  3.  After you shampoo, rinse your hair and body thoroughly to remove the  shampoo.                           4.  Use CHG as you would any other liquid soap.  You can apply chg directly  to the skin and wash                       Gently with a scrungie or clean washcloth.  5.  Apply the CHG Soap to your body ONLY FROM THE NECK DOWN.   Do not use on face/ open                           Wound or open sores. Avoid contact with eyes, ears mouth and genitals (private parts).                       Wash face,  Genitals (private parts) with your normal soap.             6.  Wash thoroughly, paying special attention to the area where your surgery  will be performed.  7.  Thoroughly rinse your body with warm water from the neck down.  8.  DO NOT shower/wash with your normal soap after using and rinsing off  the CHG Soap.                9.  Pat yourself dry with a clean towel.            10.  Wear clean pajamas.            11.  Place clean sheets on your bed the night of your first shower and do not  sleep with pets. Day of Surgery : Do not apply any lotions/deodorants the morning of surgery.  Please wear clean clothes to the hospital/surgery center.  FAILURE TO FOLLOW THESE INSTRUCTIONS MAY RESULT IN THE CANCELLATION OF YOUR SURGERY PATIENT SIGNATURE_________________________________  NURSE SIGNATURE__________________________________  ________________________________________________________________________

## 2021-07-20 ENCOUNTER — Encounter (HOSPITAL_COMMUNITY): Payer: Self-pay

## 2021-07-20 ENCOUNTER — Other Ambulatory Visit: Payer: Self-pay

## 2021-07-20 ENCOUNTER — Encounter (HOSPITAL_COMMUNITY)
Admission: RE | Admit: 2021-07-20 | Discharge: 2021-07-20 | Disposition: A | Discharge status: Home / Self Care | Payer: BC Managed Care – PPO | Source: Ambulatory Visit | Attending: Orthopedic Surgery | Admitting: Orthopedic Surgery

## 2021-07-20 VITALS — BP 136/77 | HR 67 | Temp 98.5°F | Ht 76.0 in | Wt 297.0 lb

## 2021-07-20 DIAGNOSIS — R7303 Prediabetes: Secondary | ICD-10-CM

## 2021-07-20 DIAGNOSIS — Z01818 Encounter for other preprocedural examination: Secondary | ICD-10-CM

## 2021-07-20 DIAGNOSIS — T8453XA Infection and inflammatory reaction due to internal right knee prosthesis, initial encounter: Secondary | ICD-10-CM | POA: Insufficient documentation

## 2021-07-20 DIAGNOSIS — E119 Type 2 diabetes mellitus without complications: Secondary | ICD-10-CM | POA: Insufficient documentation

## 2021-07-20 DIAGNOSIS — I10 Essential (primary) hypertension: Secondary | ICD-10-CM | POA: Insufficient documentation

## 2021-07-20 DIAGNOSIS — G4733 Obstructive sleep apnea (adult) (pediatric): Secondary | ICD-10-CM | POA: Insufficient documentation

## 2021-07-20 LAB — CBC
HCT: 40.6 % (ref 39.0–52.0)
Hemoglobin: 12.9 g/dL — ABNORMAL LOW (ref 13.0–17.0)
MCH: 29.1 pg (ref 26.0–34.0)
MCHC: 31.8 g/dL (ref 30.0–36.0)
MCV: 91.4 fL (ref 80.0–100.0)
Platelets: 222 10*3/uL (ref 150–400)
RBC: 4.44 MIL/uL (ref 4.22–5.81)
RDW: 15 % (ref 11.5–15.5)
WBC: 6.6 10*3/uL (ref 4.0–10.5)
nRBC: 0 % (ref 0.0–0.2)

## 2021-07-20 LAB — SURGICAL PCR SCREEN
MRSA, PCR: NEGATIVE
Staphylococcus aureus: NEGATIVE

## 2021-07-20 LAB — BASIC METABOLIC PANEL
Anion gap: 7 (ref 5–15)
BUN: 22 mg/dL (ref 8–23)
CO2: 28 mmol/L (ref 22–32)
Calcium: 9.3 mg/dL (ref 8.9–10.3)
Chloride: 105 mmol/L (ref 98–111)
Creatinine, Ser: 0.83 mg/dL (ref 0.61–1.24)
GFR, Estimated: >60 mL/min (ref >60)
Glucose, Bld: 109 mg/dL — ABNORMAL HIGH (ref 70–99)
Potassium: 4.7 mmol/L (ref 3.5–5.1)
Sodium: 140 mmol/L (ref 135–145)

## 2021-07-20 LAB — HEMOGLOBIN A1C
Hgb A1c MFr Bld: 5.6 % (ref 4.8–5.6)
Mean Plasma Glucose: 114.02 mg/dL

## 2021-07-20 LAB — GLUCOSE, CAPILLARY: Glucose-Capillary: 117 mg/dL — ABNORMAL HIGH (ref 70–99)

## 2021-07-20 NOTE — Progress Notes (Signed)
For Short Stay: [place] appointment date: Date of COVID positive in last 17 days:  Bowel Prep reminder:   For Anesthesia: PCP - Cyndi Bender: [facility] Cardiologist - Dr. Quay Burow Clearance: Ronal Fear: PAC: 07/14/21: EPIC Chest x-ray -  EKG - 07/14/21 Stress Test -  ECHO - 01/06/21 Cardiac Cath -  Pacemaker/ICD device last checked: Pacemaker orders received: Device Rep notified:  Spinal Cord Stimulator:  Sleep Study -  CPAP -   Fasting Blood Sugar -  Checks Blood Sugar _____ times a day Date and result of last Hgb A1c-  Blood Thinner Instructions: Aspirin Instructions: Last Dose:  Activity level: Can go up a flight of stairs and activities of daily living without stopping and without chest pain and/or shortness of breath   Able to exercise without chest pain and/or shortness of breath   Unable to go up a flight of stairs without chest pain and/or shortness of breath     Anesthesia review: Hx: HTN,Heart murmur,aneurism,OSA(NO CPAP),pre-DIA  Patient denies shortness of breath, fever, cough and chest pain at PAT appointment   Patient verbalized understanding of instructions that were given to them at the PAT appointment. Patient was also instructed that they will need to review over the PAT instructions again at home before surgery.

## 2021-07-21 NOTE — Progress Notes (Signed)
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 Paralee Cancel.   Pt last seen by cardiology 07/14/2021. Per OV note, "Mr. Culverhouse's perioperative risk of a major cardiac event is 0.4% according to the Revised Cardiac Risk Index (RCRI).  Therefore, he is at low risk for perioperative complications.   His functional capacity is fair at 5.62 METs according to the Duke Activity Status Index (DASI). Recommendations: According to ACC/AHA guidelines, no further cardiovascular testing needed.  The patient may proceed to surgery at acceptable risk.   Antiplatelet and/or Anticoagulation Recommendations: Patient is not on any blood thinners."  Anticipate pt can proceed with planned procedure barring acute status change.   VS: BP 136/77   Pulse 67   Temp 36.9 C   Ht '6\' 4"'$  (1.93 m)   Wt 134.7 kg   SpO2 97%   BMI 36.15 kg/m   PROVIDERS: Cyndi Bender, PA-C is PCP   Primary Cardiologist: Quay Burow, MD LABS: Labs reviewed: Acceptable for surgery. (all labs ordered are listed, but only abnormal results are displayed)  Labs Reviewed  CBC - Abnormal; Notable for the following components:      Result Value   Hemoglobin 12.9 (*)    All other components within normal limits  BASIC METABOLIC PANEL - Abnormal; Notable for the following components:   Glucose, Bld 109 (*)    All other components within normal limits  GLUCOSE, CAPILLARY - Abnormal; Notable for the following components:   Glucose-Capillary 117 (*)    All other components  within normal limits  SURGICAL PCR SCREEN  HEMOGLOBIN A1C     IMAGES:   EKG: 07/14/2021 Rate 65 bpm  NSR  CV: Echo 01/06/2021 1. Left ventricular ejection fraction, by estimation, is 60 to 65%. The  left ventricle has normal function. The left ventricle has no regional  wall motion abnormalities. Left ventricular diastolic parameters were  normal.   2. Right ventricular systolic function is normal. The right ventricular  size is normal.   3. The mitral valve is normal in structure. Mild mitral valve  regurgitation. No evidence of mitral stenosis.   4. The aortic valve is normal in structure. Aortic valve regurgitation is  mild to moderate. Mild aortic valve stenosis. Aortic valve area, by VTI  measures 2.37 cm. Aortic valve mean gradient measures 12.0 mmHg. Aortic  valve Vmax measures 2.29 m/s.   5. Aneurysm of the ascending aorta, measuring 44 mm.   6. The inferior vena cava is normal in size with greater than 50%  respiratory variability, suggesting right atrial pressure of 3 mmHg. Past Medical History:  Diagnosis Date   Acute meniscal tear of knee left   Aneurysm, ascending aorta ([place]) 4.8cm per ct 2011/ CARDIOLOGSIT--  DR Rollene Fare  LOV  10-07-2011  NOTE W/ CHART AND REQUEST LEXISCAN MYOVIEW AND ECHO TO BE FAXED.   ASYMPTOMATIC   Heart murmur MILD-- ASYMPTOMATIC   Hyperlipidemia    Hypertension    OA (osteoarthritis) of knee left   OSA (obstructive sleep apnea) NON-TOLERANT CPAP   no cpap PT REPORTS  NEVER FINISHED TEST    Pre-diabetes    Sleep apnea    Tinnitus of both ears     Past Surgical History:  Procedure Laterality Date   BILATERAL IINGUINAL HERNIA REPAIR  1963   DOPPLER ECHOCARDIOGRAPHY  10/18/2011   EF >55 %   ENDOVENOUS ABLATION SAPHENOUS VEIN W/ LASER Right 12/30/2020   endovenous laser ablation right greater saphenous vein by Gae Gallop MD   INCISION AND DRAINAGE OF WOUND Right 04/27/2021   Procedure: IRRIGATION AND DEBRIDEMENT OF HEMATOMA AND  ABCESS ,ASPIRATE RIGHT KNEE;  Surgeon: Sydnee Cabal, MD;  Location: [facility];  Service: Orthopedics;  Laterality: Right;  REQUEST 3:30PM START TIME   LUMBAR LAMINECTOMY/DECOMPRESSION MICRODISCECTOMY N/A 02/24/2016   Procedure: Bilateral Decompression L4-5, Discectomy L4-5 ;  Surgeon: Susa Day, MD;  Location: [facility];  Service: Orthopedics;  Laterality: N/A;   NM MYOCAR PERF WALL MOTION  10/13/2011   EF 49 %,low risk study   RIGHT KNEE SURGERY X 2      ARTHROSCOPY    TONSILLECTOMY  1965   torn meniscus left knee surgery      TORN MENISCUS X 3 IN KNEES SURGERY      LEFT    TOTAL HIP ARTHROPLASTY  06/18/2009   OA LEFT HIP   TOTAL HIP ARTHROPLASTY Right 03/11/2014   Procedure: RIGHT TOTAL HIP ARTHROPLASTY ANTERIOR APPROACH;  Surgeon: [name], MD;  Location: [facility];  Service: Orthopedics;  Laterality: Right;   TOTAL KNEE ARTHROPLASTY Right 02/26/2020   Procedure: TOTAL KNEE ARTHROPLASTY;  Surgeon: Sydnee Cabal, MD;  Location: [facility];  Service: Orthopedics;  Laterality: Right;  adductor canal   TRANSTHORACIC ECHOCARDIOGRAM  09/11/2012   EF 55 to 60 %,mild aortic valve regurg,mild mitral valve regurg,lf and rt atriums mildly dilated   VARICOSE VEIN SURGERY Left 1980   veins removed from left leg  1994    MEDICATIONS:  acetaminophen (TYLENOL) 500 MG tablet   amLODipine (NORVASC) 10 MG tablet   atorvastatin (LIPITOR) 40 MG tablet   diclofenac (VOLTAREN) 75 MG EC tablet   furosemide (LASIX) 20 MG tablet   lisinopril (PRINIVIL,ZESTRIL) 40 MG tablet   meclizine (ANTIVERT) 25 MG tablet   meloxicam (MOBIC) 15 MG tablet   pregabalin (LYRICA) 75 MG capsule   traMADol (ULTRAM) 50 MG tablet   traZODone (DESYREL) 50 MG tablet   triamcinolone (KENALOG) 0.1 %   vitamin B-12 (CYANOCOBALAMIN) 50 MCG tablet   vitamin C (ASCORBIC ACID) 500 MG tablet   No current facility-administered medications for this encounter.   Konrad Felix Ward, PA-C [facility] [phone]

## 2021-07-22 ENCOUNTER — Encounter (HOSPITAL_COMMUNITY): Payer: Self-pay | Admitting: Orthopedic Surgery

## 2021-07-22 ENCOUNTER — Inpatient Hospital Stay (HOSPITAL_COMMUNITY): Payer: BC Managed Care – PPO | Admitting: Anesthesiology

## 2021-07-22 ENCOUNTER — Other Ambulatory Visit: Payer: Self-pay

## 2021-07-22 ENCOUNTER — Inpatient Hospital Stay (HOSPITAL_COMMUNITY): Payer: BC Managed Care – PPO | Admitting: Physician Assistant

## 2021-07-22 ENCOUNTER — Inpatient Hospital Stay (HOSPITAL_COMMUNITY)
Admission: RE | Admit: 2021-07-22 | Discharge: 2021-07-24 | DRG: 468 | Disposition: A | Discharge status: Home Health | Payer: BC Managed Care – PPO | Source: Ambulatory Visit | Attending: Orthopedic Surgery | Admitting: Orthopedic Surgery

## 2021-07-22 ENCOUNTER — Encounter (HOSPITAL_COMMUNITY)
Admission: RE | Disposition: A | Discharge status: Home / Self Care | Payer: Self-pay | Source: Home / Self Care | Attending: Orthopedic Surgery

## 2021-07-22 DIAGNOSIS — B9561 Methicillin susceptible Staphylococcus aureus infection as the cause of diseases classified elsewhere: Secondary | ICD-10-CM | POA: Diagnosis present

## 2021-07-22 DIAGNOSIS — E669 Obesity, unspecified: Secondary | ICD-10-CM | POA: Diagnosis present

## 2021-07-22 DIAGNOSIS — E785 Hyperlipidemia, unspecified: Secondary | ICD-10-CM | POA: Diagnosis present

## 2021-07-22 DIAGNOSIS — Z833 Family history of diabetes mellitus: Secondary | ICD-10-CM | POA: Diagnosis not present

## 2021-07-22 DIAGNOSIS — Z6836 Body mass index (BMI) 36.0-36.9, adult: Secondary | ICD-10-CM

## 2021-07-22 DIAGNOSIS — T8453XA Infection and inflammatory reaction due to internal right knee prosthesis, initial encounter: Principal | ICD-10-CM

## 2021-07-22 DIAGNOSIS — R7303 Prediabetes: Secondary | ICD-10-CM | POA: Diagnosis present

## 2021-07-22 DIAGNOSIS — Y831 Surgical operation with implant of artificial internal device as the cause of abnormal reaction of the patient, or of later complication, without mention of misadventure at the time of the procedure: Secondary | ICD-10-CM | POA: Diagnosis present

## 2021-07-22 DIAGNOSIS — I712 Thoracic aortic aneurysm, without rupture, unspecified: Secondary | ICD-10-CM | POA: Diagnosis present

## 2021-07-22 DIAGNOSIS — Z880 Allergy status to penicillin: Secondary | ICD-10-CM | POA: Diagnosis not present

## 2021-07-22 DIAGNOSIS — Z8249 Family history of ischemic heart disease and other diseases of the circulatory system: Secondary | ICD-10-CM

## 2021-07-22 DIAGNOSIS — Z888 Allergy status to other drugs, medicaments and biological substances status: Secondary | ICD-10-CM | POA: Diagnosis not present

## 2021-07-22 DIAGNOSIS — Z87891 Personal history of nicotine dependence: Secondary | ICD-10-CM

## 2021-07-22 DIAGNOSIS — I10 Essential (primary) hypertension: Secondary | ICD-10-CM | POA: Diagnosis present

## 2021-07-22 DIAGNOSIS — G4733 Obstructive sleep apnea (adult) (pediatric): Secondary | ICD-10-CM | POA: Diagnosis present

## 2021-07-22 DIAGNOSIS — Z452 Encounter for adjustment and management of vascular access device: Secondary | ICD-10-CM

## 2021-07-22 DIAGNOSIS — I340 Nonrheumatic mitral (valve) insufficiency: Secondary | ICD-10-CM | POA: Diagnosis present

## 2021-07-22 DIAGNOSIS — Z96643 Presence of artificial hip joint, bilateral: Secondary | ICD-10-CM | POA: Diagnosis present

## 2021-07-22 HISTORY — PX: EXCISIONAL TOTAL KNEE ARTHROPLASTY WITH ANTIBIOTIC SPACERS: SHX5827

## 2021-07-22 LAB — GLUCOSE, CAPILLARY: Glucose-Capillary: 110 mg/dL — ABNORMAL HIGH (ref 70–99)

## 2021-07-22 SURGERY — REMOVAL, TOTAL ARTHROPLASTY HARDWARE, KNEE, WITH ANTIBIOTIC SPACER INSERTION
Anesthesia: Regional | Site: Knee | Laterality: Right

## 2021-07-22 MED ORDER — FENTANYL CITRATE PF 50 MCG/ML IJ SOSY
PREFILLED_SYRINGE | INTRAMUSCULAR | Status: AC
  Filled 2021-07-22: qty 2

## 2021-07-22 MED ORDER — PROPOFOL 10 MG/ML IV BOLUS
INTRAVENOUS | Status: AC
Start: 2021-07-22 — End: ?
  Filled 2021-07-22: qty 20

## 2021-07-22 MED ORDER — CHLORHEXIDINE GLUCONATE 0.12 % MT SOLN
15.0000 mL | Freq: Once | OROMUCOSAL | Status: AC
  Administered 2021-07-22: 15 mL via OROMUCOSAL

## 2021-07-22 MED ORDER — LIDOCAINE HCL (PF) 2 % IJ SOLN
INTRAMUSCULAR | Status: AC
  Filled 2021-07-22: qty 5

## 2021-07-22 MED ORDER — TRANEXAMIC ACID-NACL 1000-0.7 MG/100ML-% IV SOLN
1000.0000 mg | INTRAVENOUS | Status: AC
  Administered 2021-07-22: 1000 mg via INTRAVENOUS

## 2021-07-22 MED ORDER — DIPHENHYDRAMINE HCL 12.5 MG/5ML PO ELIX: 12.5000 mg | ORAL_SOLUTION | ORAL | Status: DC | PRN

## 2021-07-22 MED ORDER — ONDANSETRON HCL 4 MG/2ML IJ SOLN: 4.0000 mg | Freq: Four times a day (QID) | INTRAMUSCULAR | Status: DC | PRN

## 2021-07-22 MED ORDER — ACETAMINOPHEN 10 MG/ML IV SOLN
1000.0000 mg | Freq: Once | INTRAVENOUS | Status: DC | PRN
  Administered 2021-07-22: 1000 mg via INTRAVENOUS

## 2021-07-22 MED ORDER — FENTANYL CITRATE (PF) 100 MCG/2ML IJ SOLN
INTRAMUSCULAR | Status: DC | PRN
  Administered 2021-07-22 (×2): 50 ug via INTRAVENOUS
  Administered 2021-07-22: 100 ug via INTRAVENOUS

## 2021-07-22 MED ORDER — CEFAZOLIN SODIUM-DEXTROSE 2-4 GM/100ML-% IV SOLN
2.0000 g | Freq: Four times a day (QID) | INTRAVENOUS | Status: AC
  Administered 2021-07-22 – 2021-07-23 (×2): 2 g via INTRAVENOUS
  Filled 2021-07-22 (×2): qty 100

## 2021-07-22 MED ORDER — MENTHOL 3 MG MT LOZG: 1.0000 | LOZENGE | OROMUCOSAL | Status: DC | PRN

## 2021-07-22 MED ORDER — METHOCARBAMOL 500 MG PO TABS
500.0000 mg | ORAL_TABLET | Freq: Four times a day (QID) | ORAL | Status: DC | PRN
  Administered 2021-07-22 – 2021-07-24 (×3): 500 mg via ORAL
  Filled 2021-07-22 (×3): qty 1

## 2021-07-22 MED ORDER — MECLIZINE HCL 25 MG PO TABS
25.0000 mg | ORAL_TABLET | Freq: Three times a day (TID) | ORAL | Status: DC | PRN
Start: 2021-07-22 — End: 2021-07-24

## 2021-07-22 MED ORDER — AMLODIPINE BESYLATE 10 MG PO TABS
10.0000 mg | ORAL_TABLET | Freq: Every day | ORAL | Status: DC
  Administered 2021-07-23 – 2021-07-24 (×2): 10 mg via ORAL
  Filled 2021-07-22 (×2): qty 1

## 2021-07-22 MED ORDER — ONDANSETRON HCL 4 MG/2ML IJ SOLN
INTRAMUSCULAR | Status: AC
  Filled 2021-07-22: qty 2

## 2021-07-22 MED ORDER — PHENOL 1.4 % MT LIQD
1.0000 | OROMUCOSAL | Status: DC | PRN
Start: 2021-07-22 — End: 2021-07-24

## 2021-07-22 MED ORDER — BUPIVACAINE-EPINEPHRINE (PF) 0.5% -1:200000 IJ SOLN
INTRAMUSCULAR | Status: DC | PRN
  Administered 2021-07-22: 30 mL via PERINEURAL

## 2021-07-22 MED ORDER — ORAL CARE MOUTH RINSE: 15.0000 mL | Freq: Once | OROMUCOSAL | Status: AC

## 2021-07-22 MED ORDER — MELOXICAM 15 MG PO TABS
15.0000 mg | ORAL_TABLET | Freq: Every day | ORAL | Status: DC
  Administered 2021-07-23 – 2021-07-24 (×2): 15 mg via ORAL
  Filled 2021-07-22 (×2): qty 1

## 2021-07-22 MED ORDER — MIDAZOLAM HCL 2 MG/2ML IJ SOLN
1.0000 mg | INTRAMUSCULAR | Status: DC
  Administered 2021-07-22 (×2): 1 mg via INTRAVENOUS
  Filled 2021-07-22: qty 2

## 2021-07-22 MED ORDER — HYDROMORPHONE HCL 1 MG/ML IJ SOLN
INTRAMUSCULAR | Status: AC
  Filled 2021-07-22: qty 2

## 2021-07-22 MED ORDER — FUROSEMIDE 40 MG PO TABS
40.0000 mg | ORAL_TABLET | Freq: Every morning | ORAL | Status: DC
  Administered 2021-07-23 – 2021-07-24 (×2): 40 mg via ORAL
  Filled 2021-07-22 (×2): qty 1

## 2021-07-22 MED ORDER — OXYCODONE HCL 5 MG PO TABS
5.0000 mg | ORAL_TABLET | ORAL | Status: DC | PRN
  Administered 2021-07-22 – 2021-07-24 (×6): 10 mg via ORAL
  Filled 2021-07-22 (×5): qty 2

## 2021-07-22 MED ORDER — TOBRAMYCIN SULFATE 1.2 G IJ SOLR
INTRAMUSCULAR | Status: DC | PRN
  Administered 2021-07-22: 6 g

## 2021-07-22 MED ORDER — DEXAMETHASONE SODIUM PHOSPHATE 10 MG/ML IJ SOLN
INTRAMUSCULAR | Status: DC | PRN
  Administered 2021-07-22: 10 mg via INTRAVENOUS

## 2021-07-22 MED ORDER — DEXAMETHASONE SODIUM PHOSPHATE 10 MG/ML IJ SOLN
INTRAMUSCULAR | Status: AC
Start: 2021-07-22 — End: ?
  Filled 2021-07-22: qty 1

## 2021-07-22 MED ORDER — ACETAMINOPHEN 500 MG PO TABS
1000.0000 mg | ORAL_TABLET | Freq: Four times a day (QID) | ORAL | Status: AC
  Administered 2021-07-23 (×3): 1000 mg via ORAL
  Filled 2021-07-22 (×4): qty 2

## 2021-07-22 MED ORDER — PHENYLEPHRINE 80 MCG/ML (10ML) SYRINGE FOR IV PUSH (FOR BLOOD PRESSURE SUPPORT)
PREFILLED_SYRINGE | INTRAVENOUS | Status: AC
  Filled 2021-07-22: qty 10

## 2021-07-22 MED ORDER — ACETAMINOPHEN 325 MG PO TABS: 325.0000 mg | ORAL_TABLET | Freq: Four times a day (QID) | ORAL | Status: DC | PRN

## 2021-07-22 MED ORDER — FENTANYL CITRATE (PF) 100 MCG/2ML IJ SOLN
INTRAMUSCULAR | Status: AC
  Filled 2021-07-22: qty 2

## 2021-07-22 MED ORDER — ONDANSETRON HCL 4 MG/2ML IJ SOLN
INTRAMUSCULAR | Status: DC | PRN
  Administered 2021-07-22: 4 mg via INTRAVENOUS

## 2021-07-22 MED ORDER — ONDANSETRON HCL 4 MG PO TABS: 4.0000 mg | ORAL_TABLET | Freq: Four times a day (QID) | ORAL | Status: DC | PRN

## 2021-07-22 MED ORDER — DEXAMETHASONE SODIUM PHOSPHATE 10 MG/ML IJ SOLN
10.0000 mg | Freq: Once | INTRAMUSCULAR | Status: AC
  Administered 2021-07-23: 10 mg via INTRAVENOUS
  Filled 2021-07-22: qty 1

## 2021-07-22 MED ORDER — PROPOFOL 10 MG/ML IV BOLUS
INTRAVENOUS | Status: DC | PRN
  Administered 2021-07-22: 250 mg via INTRAVENOUS

## 2021-07-22 MED ORDER — BISACODYL 10 MG RE SUPP: 10.0000 mg | Freq: Every day | RECTAL | Status: DC | PRN

## 2021-07-22 MED ORDER — METOCLOPRAMIDE HCL 5 MG/ML IJ SOLN: 5.0000 mg | Freq: Three times a day (TID) | INTRAMUSCULAR | Status: DC | PRN

## 2021-07-22 MED ORDER — METHOCARBAMOL 500 MG IVPB - SIMPLE MED
500.0000 mg | Freq: Four times a day (QID) | INTRAVENOUS | Status: DC | PRN
  Administered 2021-07-22: 500 mg via INTRAVENOUS

## 2021-07-22 MED ORDER — POLYETHYLENE GLYCOL 3350 17 G PO PACK: 17.0000 g | PACK | Freq: Every day | ORAL | Status: DC | PRN

## 2021-07-22 MED ORDER — FENTANYL CITRATE PF 50 MCG/ML IJ SOSY
50.0000 ug | PREFILLED_SYRINGE | INTRAMUSCULAR | Status: DC
  Filled 2021-07-22: qty 2

## 2021-07-22 MED ORDER — METHOCARBAMOL 500 MG IVPB - SIMPLE MED
INTRAVENOUS | Status: AC
  Filled 2021-07-22: qty 55

## 2021-07-22 MED ORDER — TRAZODONE HCL 50 MG PO TABS
50.0000 mg | ORAL_TABLET | Freq: Every day | ORAL | Status: DC
Start: 2021-07-22 — End: 2021-07-24
  Administered 2021-07-22 – 2021-07-23 (×2): 50 mg via ORAL
  Filled 2021-07-22 (×2): qty 1

## 2021-07-22 MED ORDER — MIDAZOLAM HCL 5 MG/5ML IJ SOLN
INTRAMUSCULAR | Status: DC | PRN
  Administered 2021-07-22: 2 mg via INTRAVENOUS

## 2021-07-22 MED ORDER — MIDAZOLAM HCL 2 MG/2ML IJ SOLN
INTRAMUSCULAR | Status: AC
  Filled 2021-07-22: qty 2

## 2021-07-22 MED ORDER — HYDROMORPHONE HCL 1 MG/ML IJ SOLN
0.2500 mg | INTRAMUSCULAR | Status: DC | PRN
  Administered 2021-07-22 (×4): 0.5 mg via INTRAVENOUS

## 2021-07-22 MED ORDER — VANCOMYCIN HCL 1000 MG IV SOLR
INTRAVENOUS | Status: AC
Start: 2021-07-22 — End: ?
  Filled 2021-07-22: qty 20

## 2021-07-22 MED ORDER — FENTANYL CITRATE PF 50 MCG/ML IJ SOSY
25.0000 ug | PREFILLED_SYRINGE | INTRAMUSCULAR | Status: DC | PRN
  Administered 2021-07-22 (×3): 50 ug via INTRAVENOUS

## 2021-07-22 MED ORDER — SODIUM CHLORIDE 0.9 % IV SOLN: INTRAVENOUS | Status: DC

## 2021-07-22 MED ORDER — DEXTROSE 5 % IV SOLN
INTRAVENOUS | Status: DC | PRN
  Administered 2021-07-22: 3 g via INTRAVENOUS

## 2021-07-22 MED ORDER — VANCOMYCIN HCL 1000 MG IV SOLR
INTRAVENOUS | Status: AC
  Filled 2021-07-22: qty 60

## 2021-07-22 MED ORDER — FENTANYL CITRATE PF 50 MCG/ML IJ SOSY
PREFILLED_SYRINGE | INTRAMUSCULAR | Status: AC
  Filled 2021-07-22: qty 1

## 2021-07-22 MED ORDER — TOBRAMYCIN SULFATE 1.2 G IJ SOLR
INTRAMUSCULAR | Status: AC
Start: 2021-07-22 — End: ?
  Filled 2021-07-22: qty 1.2

## 2021-07-22 MED ORDER — VANCOMYCIN HCL 1000 MG IV SOLR
INTRAVENOUS | Status: DC | PRN
  Administered 2021-07-22: 5 g

## 2021-07-22 MED ORDER — ONDANSETRON HCL 4 MG/2ML IJ SOLN: 4.0000 mg | Freq: Once | INTRAMUSCULAR | Status: DC | PRN

## 2021-07-22 MED ORDER — HYDROMORPHONE HCL 1 MG/ML IJ SOLN: 0.5000 mg | INTRAMUSCULAR | Status: DC | PRN

## 2021-07-22 MED ORDER — TRANEXAMIC ACID-NACL 1000-0.7 MG/100ML-% IV SOLN: 1000.0000 mg | Freq: Once | INTRAVENOUS | Status: DC

## 2021-07-22 MED ORDER — PROPOFOL 10 MG/ML IV BOLUS
INTRAVENOUS | Status: AC
  Filled 2021-07-22: qty 20

## 2021-07-22 MED ORDER — FERROUS SULFATE 325 (65 FE) MG PO TABS
325.0000 mg | ORAL_TABLET | Freq: Three times a day (TID) | ORAL | Status: DC
  Administered 2021-07-22 – 2021-07-24 (×5): 325 mg via ORAL
  Filled 2021-07-22 (×5): qty 1

## 2021-07-22 MED ORDER — DOCUSATE SODIUM 100 MG PO CAPS
100.0000 mg | ORAL_CAPSULE | Freq: Two times a day (BID) | ORAL | Status: DC
  Administered 2021-07-22 – 2021-07-24 (×4): 100 mg via ORAL
  Filled 2021-07-22 (×4): qty 1

## 2021-07-22 MED ORDER — CEFAZOLIN IN SODIUM CHLORIDE 3-0.9 GM/100ML-% IV SOLN
INTRAVENOUS | Status: AC
  Filled 2021-07-22: qty 100

## 2021-07-22 MED ORDER — VANCOMYCIN HCL 1000 MG IV SOLR
INTRAVENOUS | Status: AC
  Filled 2021-07-22: qty 20

## 2021-07-22 MED ORDER — SODIUM CHLORIDE 0.9 % IR SOLN
Status: DC | PRN
  Administered 2021-07-22: 3000 mL
  Administered 2021-07-22: 1000 mL

## 2021-07-22 MED ORDER — TOBRAMYCIN SULFATE 1.2 G IJ SOLR
INTRAMUSCULAR | Status: AC
  Filled 2021-07-22: qty 1.2

## 2021-07-22 MED ORDER — TRANEXAMIC ACID-NACL 1000-0.7 MG/100ML-% IV SOLN
INTRAVENOUS | Status: AC
  Filled 2021-07-22: qty 100

## 2021-07-22 MED ORDER — ACETAMINOPHEN 500 MG PO TABS
1000.0000 mg | ORAL_TABLET | Freq: Once | ORAL | Status: AC
  Administered 2021-07-22: 500 mg via ORAL
  Filled 2021-07-22: qty 2

## 2021-07-22 MED ORDER — METOCLOPRAMIDE HCL 5 MG PO TABS: 5.0000 mg | ORAL_TABLET | Freq: Three times a day (TID) | ORAL | Status: DC | PRN

## 2021-07-22 MED ORDER — LIDOCAINE HCL (CARDIAC) PF 100 MG/5ML IV SOSY
PREFILLED_SYRINGE | INTRAVENOUS | Status: DC | PRN
  Administered 2021-07-22: 100 mg via INTRAVENOUS

## 2021-07-22 MED ORDER — AMISULPRIDE (ANTIEMETIC) 5 MG/2ML IV SOLN: 10.0000 mg | Freq: Once | INTRAVENOUS | Status: DC | PRN

## 2021-07-22 MED ORDER — TOBRAMYCIN SULFATE 1.2 G IJ SOLR
INTRAMUSCULAR | Status: AC
  Filled 2021-07-22: qty 3.6

## 2021-07-22 MED ORDER — ASPIRIN 81 MG PO CHEW
81.0000 mg | CHEWABLE_TABLET | Freq: Two times a day (BID) | ORAL | Status: DC
  Administered 2021-07-22 – 2021-07-24 (×4): 81 mg via ORAL
  Filled 2021-07-22 (×4): qty 1

## 2021-07-22 MED ORDER — LACTATED RINGERS IV SOLN: INTRAVENOUS | Status: DC

## 2021-07-22 MED ORDER — ACETAMINOPHEN 10 MG/ML IV SOLN
INTRAVENOUS | Status: AC
  Filled 2021-07-22: qty 100

## 2021-07-22 MED ORDER — ATORVASTATIN CALCIUM 40 MG PO TABS
40.0000 mg | ORAL_TABLET | Freq: Every day | ORAL | Status: DC
  Administered 2021-07-23 – 2021-07-24 (×2): 40 mg via ORAL
  Filled 2021-07-22 (×2): qty 1

## 2021-07-22 MED ORDER — LISINOPRIL 20 MG PO TABS
40.0000 mg | ORAL_TABLET | Freq: Every day | ORAL | Status: DC
  Administered 2021-07-23 – 2021-07-24 (×2): 40 mg via ORAL
  Filled 2021-07-22 (×2): qty 2

## 2021-07-22 MED ORDER — PREGABALIN 75 MG PO CAPS
75.0000 mg | ORAL_CAPSULE | Freq: Two times a day (BID) | ORAL | Status: DC
  Administered 2021-07-22 – 2021-07-24 (×4): 75 mg via ORAL
  Filled 2021-07-22 (×4): qty 1

## 2021-07-22 MED ORDER — PHENYLEPHRINE 80 MCG/ML (10ML) SYRINGE FOR IV PUSH (FOR BLOOD PRESSURE SUPPORT)
PREFILLED_SYRINGE | INTRAVENOUS | Status: DC | PRN
  Administered 2021-07-22: 80 ug via INTRAVENOUS

## 2021-07-22 MED ORDER — OXYCODONE HCL 5 MG PO TABS
10.0000 mg | ORAL_TABLET | ORAL | Status: DC | PRN
  Administered 2021-07-22 – 2021-07-24 (×2): 10 mg via ORAL
  Filled 2021-07-22: qty 3
  Filled 2021-07-22 (×2): qty 2

## 2021-07-22 SURGICAL SUPPLY — 61 items
AUGMENT TIBL 52 AP 81 KNEE LRG (Miscellaneous) IMPLANT
BAG ZIPLOCK 12X15 (MISCELLANEOUS) ×2 IMPLANT
BIT DRILL Q/COUPLING 1 (BIT) ×1 IMPLANT
BLADE SAW SAG 90X13X1.27 (BLADE) ×1 IMPLANT
BLADE SAW SGTL 81X20 HD (BLADE) ×1 IMPLANT
BNDG COHESIVE 6X5 TAN ST LF (GAUZE/BANDAGES/DRESSINGS) ×1 IMPLANT
BNDG ELASTIC 6X5.8 VLCR STR LF (GAUZE/BANDAGES/DRESSINGS) ×2 IMPLANT
BOWL SMART MIX CTS (DISPOSABLE) ×5 IMPLANT
BRUSH FEMORAL CANAL (MISCELLANEOUS) ×2 IMPLANT
CEMENT HV SMART SET (Cement) ×10 IMPLANT
COVER SURGICAL LIGHT HANDLE (MISCELLANEOUS) ×2 IMPLANT
CUFF TOURN SGL QUICK 34 (TOURNIQUET CUFF) ×1
CUFF TRNQT CYL 34X4.125X (TOURNIQUET CUFF) ×1 IMPLANT
DERMABOND ADVANCED (GAUZE/BANDAGES/DRESSINGS) ×1
DERMABOND ADVANCED .7 DNX12 (GAUZE/BANDAGES/DRESSINGS) ×1 IMPLANT
DRAPE INCISE IOBAN 66X45 STRL (DRAPES) ×6 IMPLANT
DRAPE U-SHAPE 47X51 STRL (DRAPES) ×2 IMPLANT
DRESSING AQUACEL AG SP 3.5X10 (GAUZE/BANDAGES/DRESSINGS) ×1 IMPLANT
DRSG AQUACEL AG ADV 3.5X10 (GAUZE/BANDAGES/DRESSINGS) ×1 IMPLANT
DRSG AQUACEL AG ADV 3.5X14 (GAUZE/BANDAGES/DRESSINGS) ×1 IMPLANT
DRSG AQUACEL AG SP 3.5X10 (GAUZE/BANDAGES/DRESSINGS)
DRSG PAD ABDOMINAL 8X10 ST (GAUZE/BANDAGES/DRESSINGS) ×1 IMPLANT
DURAPREP 26ML APPLICATOR (WOUND CARE) ×3 IMPLANT
ELECT REM PT RETURN 15FT ADLT (MISCELLANEOUS) ×2 IMPLANT
FEMORAL 53 AP 75 KNEE LRG (Miscellaneous) ×1 IMPLANT
GLOVE BIO SURGEON STRL SZ 6 (GLOVE) ×2 IMPLANT
GLOVE BIOGEL PI IND STRL 6.5 (GLOVE) ×1 IMPLANT
GLOVE BIOGEL PI IND STRL 7.5 (GLOVE) ×1 IMPLANT
GLOVE BIOGEL PI INDICATOR 6.5 (GLOVE) ×1
GLOVE BIOGEL PI INDICATOR 7.5 (GLOVE) ×1
GLOVE ORTHO TXT STRL SZ7.5 (GLOVE) ×4 IMPLANT
GOWN STRL REUS W/ TWL LRG LVL3 (GOWN DISPOSABLE) ×3 IMPLANT
GOWN STRL REUS W/TWL LRG LVL3 (GOWN DISPOSABLE) ×3
HANDPIECE INTERPULSE COAX TIP (DISPOSABLE) ×1
IMMOBILIZER KNEE 22  40 CIR (ORTHOPEDIC SUPPLIES) ×1
IMMOBILIZER KNEE 22 40 CIR (ORTHOPEDIC SUPPLIES) IMPLANT
IMMOBILIZER KNEE 22 UNIV (SOFTGOODS) ×1 IMPLANT
JET LAVAGE IRRISEPT WOUND (IRRIGATION / IRRIGATOR)
KIT TURNOVER KIT A (KITS) ×1 IMPLANT
LAVAGE JET IRRISEPT WOUND (IRRIGATION / IRRIGATOR) ×1 IMPLANT
MANIFOLD NEPTUNE II (INSTRUMENTS) ×2 IMPLANT
NS IRRIG 1000ML POUR BTL (IV SOLUTION) ×2 IMPLANT
PACK TOTAL KNEE CUSTOM (KITS) ×2 IMPLANT
PADDING CAST ABS 6INX4YD NS (CAST SUPPLIES) ×1
PADDING CAST ABS COTTON 6X4 NS (CAST SUPPLIES) IMPLANT
PADDING CAST COTTON 6X4 STRL (CAST SUPPLIES) ×1 IMPLANT
PROTECTOR NERVE ULNAR (MISCELLANEOUS) ×2 IMPLANT
SET HNDPC FAN SPRY TIP SCT (DISPOSABLE) ×1 IMPLANT
SET PAD KNEE POSITIONER (MISCELLANEOUS) ×2 IMPLANT
SOLUTION PRONTOSAN WOUND 350ML (IRRIGATION / IRRIGATOR) ×1 IMPLANT
SPONGE T-LAP 18X18 ~~LOC~~+RFID (SPONGE) ×1 IMPLANT
STAPLER VISISTAT 35W (STAPLE) ×1 IMPLANT
SUT MNCRL AB 3-0 PS2 18 (SUTURE) ×2 IMPLANT
SUT STRATAFIX PDS+ 0 24IN (SUTURE) ×2 IMPLANT
SUT VIC AB 1 CT1 36 (SUTURE) ×4 IMPLANT
SUT VIC AB 2-0 CT1 27 (SUTURE) ×3
SUT VIC AB 2-0 CT1 TAPERPNT 27 (SUTURE) ×3 IMPLANT
TIBIAL 52 AP 81 KNEE LRG (Miscellaneous) ×2 IMPLANT
TRAY FOLEY MTR SLVR 16FR STAT (SET/KITS/TRAYS/PACK) ×1 IMPLANT
WATER STERILE IRR 1000ML POUR (IV SOLUTION) ×1 IMPLANT
WRAP KNEE MAXI GEL POST OP (GAUZE/BANDAGES/DRESSINGS) ×2 IMPLANT

## 2021-07-22 NOTE — Anesthesia Procedure Notes (Signed)
Procedure Name: LMA Insertion Date/Time: 07/22/2021 11:59 AM  Performed by: Lind Covert, CRNAPre-anesthesia Checklist: Patient identified, Emergency Drugs available, Suction available, Patient being monitored and Timeout performed Patient Re-evaluated:Patient Re-evaluated prior to induction Oxygen Delivery Method: Circle system utilized Preoxygenation: Pre-oxygenation with 100% oxygen Induction Type: IV induction LMA: LMA inserted LMA Size: 5.0 Tube type: Oral Number of attempts: 1 Placement Confirmation: positive ETCO2 and breath sounds checked- equal and bilateral Tube secured with: Tape Dental Injury: Teeth and Oropharynx as per pre-operative assessment

## 2021-07-22 NOTE — Transfer of Care (Signed)
Immediate Anesthesia Transfer of Care Note  Patient: Travis Myers  Procedure(s) Performed: EXCISIONAL TOTAL KNEE ARTHROPLASTY WITH ANTIBIOTIC SPACERS (Right: Knee)  Patient Location: PACU  Anesthesia Type:General  Level of Consciousness: drowsy  Airway & Oxygen Therapy: Patient Spontanous Breathing and Patient connected to face mask oxygen  Post-op Assessment: Report given to RN and Post -op Vital signs reviewed and stable  Post vital signs: Reviewed and stable  Last Vitals:  Vitals Value Taken Time  BP 139/82 07/22/21 1437  Temp    Pulse 81 07/22/21 1441  Resp 11 07/22/21 1441  SpO2 97 % 07/22/21 1441  Vitals shown include unvalidated device data.  Last Pain:  Vitals:   07/22/21 1008  TempSrc:   PainSc: 0-No pain      Patients Stated Pain Goal: 3 (12/52/47 9980)  Complications: No notable events documented.

## 2021-07-22 NOTE — Anesthesia Procedure Notes (Signed)
Anesthesia Regional Block: Adductor canal block   Pre-Anesthetic Checklist: , timeout performed,  Correct Patient, Correct Site, Correct Laterality,  Correct Procedure,, site marked,  Risks and benefits discussed,  Surgical consent,  Pre-op evaluation,  At surgeon's request and post-op pain management  Laterality: Right  Prep: chloraprep       Needles:  Injection technique: Single-shot  Needle Type: Echogenic Stimulator Needle     Needle Length: 10cm  Needle Gauge: 20     Additional Needles:   Procedures:,,,, ultrasound used (permanent image in chart),,    Narrative:  Start time: 07/22/2021 10:00 AM End time: 07/22/2021 10:10 AM Injection made incrementally with aspirations every 5 mL.  Performed by: Personally  Anesthesiologist: Murvin Natal, MD  Additional Notes: Functioning IV was confirmed and monitors were applied. A time-out was performed. Hand hygiene and sterile gloves were used. The thigh was placed in a frog-leg position and prepped in a sterile fashion. A 12m 20ga Bbraun echogenic stimulator needle was placed using ultrasound guidance.  Negative aspiration and negative test dose prior to incremental administration of local anesthetic. The patient tolerated the procedure well.

## 2021-07-22 NOTE — Anesthesia Postprocedure Evaluation (Signed)
Anesthesia Post Note  Patient: Travis Myers  Procedure(s) Performed: EXCISIONAL TOTAL KNEE ARTHROPLASTY WITH ANTIBIOTIC SPACERS (Right: Knee)     Patient location during evaluation: PACU Anesthesia Type: Regional and General Level of consciousness: awake Pain management: pain level controlled Vital Signs Assessment: post-procedure vital signs reviewed and stable Respiratory status: spontaneous breathing, nonlabored ventilation, respiratory function stable and patient connected to nasal cannula oxygen Cardiovascular status: blood pressure returned to baseline and stable Postop Assessment: no apparent nausea or vomiting Anesthetic complications: no   No notable events documented.  Last Vitals:  Vitals:   07/22/21 1600 07/22/21 1644  BP: 115/78 136/81  Pulse: 67 66  Resp: 12 16  Temp:  36.7 C  SpO2: 95% 99%    Last Pain:  Vitals:   07/22/21 1644  TempSrc: Oral  PainSc: 7                  Alec Jaros P Auryn Paige

## 2021-07-22 NOTE — Progress Notes (Signed)
CULTURE RESULTS:

## 2021-07-22 NOTE — Anesthesia Preprocedure Evaluation (Addendum)
Anesthesia Evaluation  Patient identified by MRN, date of birth, ID band Patient awake    Reviewed: Allergy & Precautions, NPO status , Patient's Chart, lab work & pertinent test results  Airway Mallampati: II  TM Distance: >3 FB Neck ROM: Full    Dental no notable dental hx.    Pulmonary sleep apnea ,    Pulmonary exam normal        Cardiovascular hypertension, Pt. on medications Normal cardiovascular exam+ Valvular Problems/Murmurs AS      Neuro/Psych negative neurological ROS  negative psych ROS   GI/Hepatic negative GI ROS, Neg liver ROS,   Endo/Other  negative endocrine ROS  Renal/GU negative Renal ROS     Musculoskeletal  (+) Arthritis ,   Abdominal (+) + obese,   Peds  Hematology negative hematology ROS (+)   Anesthesia Other Findings Infected right total knee arthroplasty  Reproductive/Obstetrics                                                             Anesthesia Evaluation    Reviewed: Allergy & Precautions, Patient's Chart, lab work & pertinent test results  Airway Mallampati: III  TM Distance: >3 FB Neck ROM: Full    Dental no notable dental hx.    Pulmonary sleep apnea ,    Pulmonary exam normal        Cardiovascular hypertension, Pt. on medications Normal cardiovascular exam  Thoracic aortic aneurysm   Neuro/Psych negative neurological ROS  negative psych ROS   GI/Hepatic negative GI ROS, Neg liver ROS,   Endo/Other  negative endocrine ROS  Renal/GU negative Renal ROS     Musculoskeletal  (+) Arthritis ,   Abdominal (+) + obese,   Peds  Hematology  (+) Blood dyscrasia, anemia ,   Anesthesia Other Findings Right hematoma, abcess  Reproductive/Obstetrics                             Anesthesia Physical Anesthesia Plan  ASA: 2  Anesthesia Plan: General   Post-op Pain Management:    Induction:  Intravenous  PONV Risk Score and Plan: 2 and Ondansetron, Dexamethasone, Midazolam and Treatment may vary due to age or medical condition  Airway Management Planned: LMA  Additional Equipment:   Intra-op Plan:   Post-operative Plan: Extubation in OR  Informed Consent: I have reviewed the patients History and Physical, chart, labs and discussed the procedure including the risks, benefits and alternatives for the proposed anesthesia with the patient or authorized representative who has indicated his/her understanding and acceptance.     Dental advisory given  Plan Discussed with: CRNA  Anesthesia Plan Comments:         Anesthesia Quick Evaluation  Anesthesia Physical Anesthesia Plan  ASA: 3  Anesthesia Plan: General and Regional   Post-op Pain Management: Regional block*   Induction: Intravenous  PONV Risk Score and Plan: 2 and Ondansetron, Dexamethasone, Midazolam and Treatment may vary due to age or medical condition  Airway Management Planned: LMA  Additional Equipment:   Intra-op Plan:   Post-operative Plan: Extubation in OR  Informed Consent: I have reviewed the patients History and Physical, chart, labs and discussed the procedure including the risks, benefits and alternatives for the proposed anesthesia with the  patient or authorized representative who has indicated his/her understanding and acceptance.     Dental advisory given  Plan Discussed with: CRNA  Anesthesia Plan Comments:         Anesthesia Quick Evaluation

## 2021-07-22 NOTE — Discharge Instructions (Signed)

## 2021-07-22 NOTE — Progress Notes (Signed)
AssistedDr. Roanna Banning with right, adductor canal, ultrasound guided block. Side rails up, monitors on throughout procedure. See vital signs in flow sheet. Tolerated Procedure well.

## 2021-07-22 NOTE — H&P (Signed)
Infected right total knee replacement  Full H&P pending  To OR for initial treatment for 2 stage management of his infection - resection arthroplasty with placement of antibiotic spacer

## 2021-07-22 NOTE — H&P (Signed)
TOTAL KNEE RESECTION WITH ANTIBIOTIC SPACER PLACEMENT ADMISSION H&P  Patient is being admitted for Right total knee arthroplasty resection with placement of antibiotic spacer   Subjective:  Chief Complaint: Prosthetic joint infection, right total knee  HPI: Travis Myers, 66 y.o. male, has a history of primary right total knee arthroplasty by Dr. Theda Sers on 02/26/20.  He subsequently had a fall resulting in a large hematoma formation which became infected. He was taken back to the OR for irrigation and debridement on 04/27/21. In office aspiration was performed and sent for synovasure culture. This grew staph lugdunensis. CRP elevated to 18, ESR within normal limits at 20. Dr. Alvan Dame discussed two stage revision which he is in agreement with.  Patient Active Problem List   Diagnosis Date Noted   Infection of right knee ([place]) 04/26/2021   Infected hematoma    Osteoarthritis of right knee 02/26/2020   Aortic regurgitation 07/26/2018   Spinal stenosis of lumbar region 02/24/2016   HNP (herniated nucleus pulposus), lumbar 02/24/2016   Obese 03/13/2014   S/P right THA, AA 03/11/2014   Preoperative clearance 02/28/2014   Thoracic aortic aneurysm ([place]) 03/14/2013   Essential hypertension 03/14/2013   Hyperlipidemia 03/14/2013   Past Medical History:  Diagnosis Date   Acute meniscal tear of knee left   Aneurysm, ascending aorta ([place]) 4.8cm per ct 2011/ CARDIOLOGSIT--  DR Rollene Fare  LOV  10-07-2011  NOTE W/ CHART AND REQUEST LEXISCAN MYOVIEW AND ECHO TO BE FAXED.   ASYMPTOMATIC   Heart murmur MILD-- ASYMPTOMATIC   Hyperlipidemia    Hypertension    OA (osteoarthritis) of knee left   OSA (obstructive sleep apnea) NON-TOLERANT CPAP   no cpap PT REPORTS NEVER FINISHED TEST    Pre-diabetes    Sleep apnea    Tinnitus of both ears     Past Surgical History:  Procedure Laterality Date   BILATERAL IINGUINAL HERNIA REPAIR  1963   DOPPLER ECHOCARDIOGRAPHY  10/18/2011   EF >55 %    ENDOVENOUS ABLATION SAPHENOUS VEIN W/ LASER Right 12/30/2020   endovenous laser ablation right greater saphenous vein by Gae Gallop MD   INCISION AND DRAINAGE OF WOUND Right 04/27/2021   Procedure: IRRIGATION AND DEBRIDEMENT OF HEMATOMA AND ABCESS ,ASPIRATE RIGHT KNEE;  Surgeon: Sydnee Cabal, MD;  Location: [facility];  Service: Orthopedics;  Laterality: Right;  REQUEST 3:30PM START TIME   LUMBAR LAMINECTOMY/DECOMPRESSION MICRODISCECTOMY N/A 02/24/2016   Procedure: Bilateral Decompression L4-5, Discectomy L4-5 ;  Surgeon: Susa Day, MD;  Location: [facility];  Service: Orthopedics;  Laterality: N/A;   NM MYOCAR PERF WALL MOTION  10/13/2011   EF 49 %,low risk study   RIGHT KNEE SURGERY X 2      ARTHROSCOPY    TONSILLECTOMY  1965   torn meniscus left knee surgery      TORN MENISCUS X 3 IN KNEES SURGERY      LEFT    TOTAL HIP ARTHROPLASTY  06/18/2009   OA LEFT HIP   TOTAL HIP ARTHROPLASTY Right 03/11/2014   Procedure: RIGHT TOTAL HIP ARTHROPLASTY ANTERIOR APPROACH;  Surgeon: [name], MD;  Location: [facility];  Service: Orthopedics;  Laterality: Right;   TOTAL KNEE ARTHROPLASTY Right 02/26/2020   Procedure: TOTAL KNEE ARTHROPLASTY;  Surgeon: Sydnee Cabal, MD;  Location: [facility];  Service: Orthopedics;  Laterality: Right;  adductor canal   TRANSTHORACIC ECHOCARDIOGRAM  09/11/2012   EF 55 to 60 %,mild aortic valve regurg,mild mitral valve regurg,lf and rt atriums mildly dilated  VARICOSE VEIN SURGERY Left 1980   veins removed from left leg  1994    Current Facility-Administered Medications  Medication Dose Route Frequency Provider Last Rate Last Admin   fentaNYL (SUBLIMAZE) injection 50-100 mcg  50-100 mcg Intravenous UD Ellender, Karyl Kinnier, MD       lactated ringers infusion   Intravenous Continuous Nolon Nations, MD 10 mL/hr at 07/22/21 0945 Continued from Pre-op at 07/22/21 0945   midazolam (VERSED) injection 1-2 mg  1-2 mg Intravenous UD Ellender, Karyl Kinnier, MD   1 mg at 07/22/21 1000    Facility-Administered Medications Ordered in Other Encounters  Medication Dose Route Frequency Provider Last Rate Last Admin   bupivacaine-epinephrine (PF) (MARCAINE W/ EPI) 0.5% -1:200000 injection   Peri-NEURAL Anesthesia Intra-op Ellender, Karyl Kinnier, MD   30 mL at 07/22/21 1005   Allergies  Allergen Reactions   Penicillins Other (See Comments)    Childhood reaction- not recalled Has patient had a PCN reaction causing immediate rash, facial/tongue/throat swelling, SOB or lightheadedness with hypotension:unsure Has patient had a PCN reaction causing severe rash involving mucus membranes or skin necrosis:unsure Has patient had a PCN reaction that required hospitalization:unsure Has patient had a PCN reaction occurring within the last 10 years:No If all of the above answers are "NO", then may proceed with Cephalosporin use.    Quinolones Other (See Comments)    Patient was warned about not using Cipro and similar antibiotics. Recent studies have raised concern that fluoroquinolone antibiotics could be associated with an increased risk of aortic aneurysm Fluoroquinolones have non-antimicrobial properties that might jeopardise the integrity of the extracellular matrix of the vascular wall In a  propensity score matched cohort study in [place], there was a 66% increased rate of aortic aneurysm or dissection associated with oral fluoroquinolone use, compared wit    Social History   Tobacco Use   Smoking status: Never   Smokeless tobacco: Former    Quit date: 09/11/1988  Substance Use Topics   Alcohol use: Yes    Alcohol/week: 1.0 standard drink of alcohol    Types: 1 Standard drinks or equivalent per week    Comment: RARE    Family History  Problem Relation Age of Onset   Diabetes Mother    Cancer Mother    Heart failure Other    Diabetes Other    Cancer Other    COPD Other    Colon cancer Neg Hx    Stomach cancer Neg Hx       Review of Systems  Constitutional:  Negative for  chills and fever.  Respiratory:  Negative for cough and shortness of breath.   Cardiovascular:  Negative for chest pain.  Gastrointestinal:  Negative for nausea and vomiting.  Musculoskeletal:  Positive for arthralgias.      Objective:  Physical Exam Very pleasant 66 year old male awake alert and oriented. He is in no acute distress. He walks in the office with a stiff antalgic knee gait pattern favoring the right knee. He wears bilateral compression stockings.  Right knee exam: His surgical incision currently is healed anteriorly. The lateral incision is healed without erythema and no recurrent swelling or fluctuance Some warmth of the knee which could be anticipated to be within normal limits. Small effusion palpable 5 degree flexion contracture and current flexion to 100 degrees with tightness and pain   Vital signs in last 24 hours: Temp:  [97.7 F (36.5 C)] 97.7 F (36.5 C) (07/13 0908) Pulse Rate:  [60] 60 (07/13 0908) Resp:  [  18] 18 (07/13 0908) BP: (149)/(81) 149/81 (07/13 0908) SpO2:  [99 %] 99 % (07/13 0908) Weight:  [134.7 kg] 134.7 kg (07/13 0927)  Labs:  Estimated body mass index is 36.15 kg/m as calculated from the following:   Height as of this encounter: '6\' 4"'  (1.93 m).   Weight as of this encounter: 134.7 kg.  Imaging Review Imaging: Today I reviewed plain radiographs of the right knee which reveal stable well fixed femoral and tibial components. There is no apparent evidence of any complication or concern. On the lateral radiograph of the knee films there is no evidence of obvious concern for the patella component with loosening  MRI of his right knee had been performed on 06/18/2021. They raise concern for possible loosening of the patella component. There is evidence of "osteolysis" of the bone hardware interface and slight superior subluxation of the patella hardware compared to the prior study from March. Otherwise noted interval resolution of multiseptated  fluid collection along the anterior lateral knee. Moderate knee effusion with synovitis similar to prior study.  Laboratory studies: Sedimentation rate of 20 with a high normal of 30 C-reactive protein 18 with a high normal of 10    Assessment/Plan:  Right knee prosthetic joint infection  The patient history, physical examination, clinical judgment of the provider and imaging studies are consistent with Right knee prosthetic joint infection, previous total knee arthroplasty. Resection total knee arthroplasty is deemed medically necessary. The treatment options were discussed at length. The risks and benefits of resection total knee arthroplasty were presented and reviewed. The risks due to recurrent infection, stiffness, patella tracking problems, thromboembolic complications and other imponderables were discussed. The patient acknowledged the explanation, agreed to proceed with the plan and consent was signed. Patient is being admitted for inpatient treatment for surgery, pain control, PT, OT, prophylactic antibiotics, VTE prophylaxis, progressive ambulation and ADL's and discharge planning.The patient is planning to be discharged  home. Costella Hatcher, PA-C Orthopedic Surgery [facility] [place] [phone]

## 2021-07-23 ENCOUNTER — Inpatient Hospital Stay: Payer: Self-pay

## 2021-07-23 ENCOUNTER — Other Ambulatory Visit (HOSPITAL_COMMUNITY): Payer: Self-pay

## 2021-07-23 LAB — CBC
HCT: 33.3 % — ABNORMAL LOW (ref 39.0–52.0)
Hemoglobin: 10.2 g/dL — ABNORMAL LOW (ref 13.0–17.0)
MCH: 29.7 pg (ref 26.0–34.0)
MCHC: 30.6 g/dL (ref 30.0–36.0)
MCV: 97.1 fL (ref 80.0–100.0)
Platelets: 155 10*3/uL (ref 150–400)
RBC: 3.43 MIL/uL — ABNORMAL LOW (ref 4.22–5.81)
RDW: 14.7 % (ref 11.5–15.5)
WBC: 10.5 10*3/uL (ref 4.0–10.5)
nRBC: 0 % (ref 0.0–0.2)

## 2021-07-23 LAB — BASIC METABOLIC PANEL
Anion gap: 6 (ref 5–15)
BUN: 15 mg/dL (ref 8–23)
CO2: 24 mmol/L (ref 22–32)
Calcium: 8.5 mg/dL — ABNORMAL LOW (ref 8.9–10.3)
Chloride: 107 mmol/L (ref 98–111)
Creatinine, Ser: 0.76 mg/dL (ref 0.61–1.24)
GFR, Estimated: >60 mL/min (ref >60)
Glucose, Bld: 173 mg/dL — ABNORMAL HIGH (ref 70–99)
Potassium: 4.1 mmol/L (ref 3.5–5.1)
Sodium: 137 mmol/L (ref 135–145)

## 2021-07-23 LAB — SEDIMENTATION RATE: Sed Rate: 35 mm/hr — ABNORMAL HIGH (ref 0–16)

## 2021-07-23 LAB — C-REACTIVE PROTEIN: CRP: 2 mg/dL — ABNORMAL HIGH (ref <1.0)

## 2021-07-23 MED ORDER — TRIAMCINOLONE ACETONIDE 0.1 % EX CREA
TOPICAL_CREAM | Freq: Every day | CUTANEOUS | Status: DC
  Filled 2021-07-23: qty 15

## 2021-07-23 MED ORDER — DOCUSATE SODIUM 100 MG PO CAPS
100.0000 mg | ORAL_CAPSULE | Freq: Two times a day (BID) | ORAL | 0 refills | Status: DC
  Filled 2021-07-23: qty 10, 5d supply, fill #0

## 2021-07-23 MED ORDER — OXYCODONE HCL 5 MG PO TABS
5.0000 mg | ORAL_TABLET | ORAL | 0 refills | Status: DC | PRN
  Filled 2021-07-23: qty 42, 5d supply, fill #0

## 2021-07-23 MED ORDER — CEFAZOLIN IV (FOR PTA / DISCHARGE USE ONLY): 2.0000 g | Freq: Three times a day (TID) | INTRAVENOUS | 0 refills | Status: AC

## 2021-07-23 MED ORDER — ASPIRIN 81 MG PO CHEW
81.0000 mg | CHEWABLE_TABLET | Freq: Two times a day (BID) | ORAL | 0 refills | Status: AC
  Filled 2021-07-23: qty 56, 28d supply, fill #0

## 2021-07-23 MED ORDER — METHOCARBAMOL 500 MG PO TABS
500.0000 mg | ORAL_TABLET | Freq: Four times a day (QID) | ORAL | 0 refills | Status: DC | PRN
  Filled 2021-07-23: qty 40, 10d supply, fill #0

## 2021-07-23 MED ORDER — POLYETHYLENE GLYCOL 3350 17 G PO PACK
17.0000 g | PACK | Freq: Every day | ORAL | 0 refills | Status: DC | PRN
  Filled 2021-07-23: qty 14, 14d supply, fill #0

## 2021-07-23 MED ORDER — CEFAZOLIN SODIUM-DEXTROSE 2-4 GM/100ML-% IV SOLN
2.0000 g | Freq: Three times a day (TID) | INTRAVENOUS | Status: DC
  Administered 2021-07-23 – 2021-07-24 (×3): 2 g via INTRAVENOUS
  Filled 2021-07-23 (×4): qty 100

## 2021-07-23 NOTE — Progress Notes (Signed)
PHARMACY CONSULT NOTE FOR:  OUTPATIENT  PARENTERAL ANTIBIOTIC THERAPY (OPAT)  Indication: R-knee PJI Regimen: Cefazolin 2g IV every 8 hours End date: 09/02/21  IV antibiotic discharge orders are pended. To discharging provider:  please sign these orders via discharge navigator,  Select New Orders & click on the button choice - Manage This Unsigned Work.     Thank you for allowing pharmacy to be a part of this patient's care.  Alycia Rossetti, PharmD, BCPS Infectious Diseases Clinical Pharmacist 07/23/2021 9:54 AM   **Pharmacist phone directory can now be found on [email or web address] ([email or web address]).  Listed under [place].

## 2021-07-23 NOTE — Progress Notes (Signed)
Patient ID: Travis Myers, male   DOB: 24-Mar-1955, 66 y.o.   MRN: [id number] Subjective: 1 Day Post-Op Procedure(s) (LRB): EXCISIONAL TOTAL KNEE ARTHROPLASTY WITH ANTIBIOTIC SPACERS (Right)    Patient reports pain as moderate. No events over night.  Objective:   VITALS:   Vitals:   07/23/21 0236 07/23/21 0546  BP: 131/75 132/72  Pulse: 63 61  Resp: 15 16  Temp: 98.4 F (36.9 C) 98.8 F (37.1 C)  SpO2: 98% 98%    Neurovascular intact Incision: dressing C/D/I Expected RLE swelling at this point  LABS Recent Labs    07/20/21 0828 07/23/21 0353  HGB 12.9* 10.2*  HCT 40.6 33.3*  WBC 6.6 10.5  PLT 222 155    Recent Labs    07/20/21 0828 07/23/21 0353  NA 140 137  K 4.7 4.1  BUN 22 15  CREATININE 0.83 0.76  GLUCOSE 109* 173*    No results for input(s): "LABPT", "INR" in the last 72 hours.   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

## 2021-07-23 NOTE — Progress Notes (Signed)
Physical Therapy Treatment Patient Details Name: Travis Myers MRN: [id number] DOB: 05/07/1955 Today's Date: 07/23/2021   History of Present Illness 66 yo male admitted for R knee infecton and now s/p  R TKR resection with spacer. Pt S/P I&D of hematoma/abscess and knee aspiration on 04/27/21. PMH: R TKA 02/26/20, HTN, HLD, pre-DM, sleep apnea, AAA, meniscal tear L knee, R THA 2016, L THA 2011 lumbar laminectomy/decompression 2018    PT Comments    Pt progressing well with mobility and hopeful for dc home this date.  Pt up to ambulate increased distance in hall and up to bathroom for toileting.  Reviewed don/doff KI.   Recommendations for follow up therapy are one component of a multi-disciplinary discharge planning process, led by the attending physician.  Recommendations may be updated based on patient status, additional functional criteria and insurance authorization.  Follow Up Recommendations  Follow physician's recommendations for discharge plan and follow up therapies     Assistance Recommended at Discharge Intermittent Supervision/Assistance  Patient can return home with the following A little help with walking and/or transfers;A little help with bathing/dressing/bathroom;Help with stairs or ramp for entrance;Assistance with cooking/housework;Assist for transportation   Equipment Recommendations  None recommended by PT    Recommendations for Other Services       Precautions / Restrictions Precautions Precautions: Knee;Fall Precaution Comments: Clarifying ROM  allowed with spacer Required Braces or Orthoses: Knee Immobilizer - Right Knee Immobilizer - Right: On when out of bed or walking Restrictions Weight Bearing Restrictions: Yes RUE Weight Bearing: Partial weight bearing RUE Partial Weight Bearing Percentage or Pounds: 50% with KI     Mobility  Bed Mobility Overal bed mobility: Needs Assistance Bed Mobility: Supine to Sit, Sit to Supine     Supine to sit:  Min assist Sit to supine: Min assist   General bed mobility comments: min assist with R LE    Transfers Overall transfer level: Needs assistance Equipment used: Rolling walker (2 wheels) Transfers: Sit to/from Stand Sit to Stand: Min guard           General transfer comment: cues for LE management and use of UEs to self assist    Ambulation/Gait Ambulation/Gait assistance: Min guard, Supervision Gait Distance (Feet): 180 Feet Assistive device: Rolling walker (2 wheels) Gait Pattern/deviations: Step-to pattern, Decreased step length - right, Decreased step length - left, Shuffle, Trunk flexed Gait velocity: decr     General Gait Details: min cues for sequence and position from RW to insure PWB   Stairs             Wheelchair Mobility    Modified Rankin (Stroke Patients Only)       Balance Overall balance assessment: Mild deficits observed, not formally tested                                          Cognition Arousal/Alertness: Awake/alert Behavior During Therapy: WFL for tasks assessed/performed Overall Cognitive Status: Within Functional Limits for tasks assessed                                          Exercises      General Comments        Pertinent Vitals/Pain Pain Assessment Pain Assessment: 0-10 Pain Score: 5  Pain  Location: R knee Pain Descriptors / Indicators: Aching, Sore Pain Intervention(s): Limited activity within patient's tolerance, Monitored during session, Premedicated before session, Ice applied    Home Living Family/patient expects to be discharged to:: Private residence Living Arrangements: Spouse/significant other Available Help at Discharge: Family Type of Home: House Home Access: Stairs to enter Entrance Stairs-Rails: Right Entrance Stairs-Number of Steps: 3   Home Layout: One level Home Equipment: Conservation officer, nature (2 wheels);Cane - single point;BSC/3in1      Prior Function             PT Goals (current goals can now be found in the care plan section) Acute Rehab PT Goals Patient Stated Goal: Regain IND PT Goal Formulation: With patient Time For Goal Achievement: 07/30/21 Potential to Achieve Goals: Good Progress towards PT goals: Progressing toward goals    Frequency    7X/week      PT Plan Current plan remains appropriate    Co-evaluation              AM-PAC PT "6 Clicks" Mobility   Outcome Measure  Help needed turning from your back to your side while in a flat bed without using bedrails?: A Little Help needed moving from lying on your back to sitting on the side of a flat bed without using bedrails?: A Little Help needed moving to and from a bed to a chair (including a wheelchair)?: A Little Help needed standing up from a chair using your arms (e.g., wheelchair or bedside chair)?: A Little Help needed to walk in hospital room?: A Little Help needed climbing 3-5 steps with a railing? : A Lot 6 Click Score: 17    End of Session Equipment Utilized During Treatment: Gait belt;Right knee immobilizer Activity Tolerance: Patient tolerated treatment well Patient left: in bed;with call bell/phone within reach;with bed alarm set Nurse Communication: Mobility status PT Visit Diagnosis: Pain;Difficulty in walking, not elsewhere classified (R26.2) Pain - Right/Left: Right Pain - part of body: Knee     Time: 6384-6659 PT Time Calculation (min) (ACUTE ONLY): 44 min  Charges:  $Gait Training: 23-37 mins                     [place] Pager [phone] Office [phone]    Conn Trombetta 07/23/2021, 4:09 PM

## 2021-07-23 NOTE — Consult Note (Signed)
[facility] for Infectious Diseases                                                                                        Patient Identification: Patient Name: Travis Myers MRN: [id number] [place] Date: 07/22/2021  8:18 AM Today's Date: 07/23/2021 Reason for consult: PJI  Requesting provider: Paralee Cancel   Principal Problem:   Infection of prosthetic right knee joint ([place])   Antibiotics:  Cefazolin 7/13-  Lines/Hardware:right total hip arthroplasty March 2016 left hip total arthroplasty June 2011, lumbar laminectomy/decompression  Assessment 66 year old male with PMH of right total hip arthroplasty March 2016 left hip total arthroplasty June 2011, lumbar laminectomy/decompression, right total knee arthroplasty February 9476 complicated by several months of worsening pain/swelling laterally status post I&D and evacuation of hematoma and abscess in April 27, 2021  with negative cx ( 4/10 status post IR aspiration of the hematoma, culture staph lugdunensis) status post 2 weeks of cefadroxil admitted with   PJI rt knee  07/23/21 s/p excision of total knee arthroplasty with antibiotic spacers on 7/13.  OR cultures sent.  Per our notes, fairly large purulent effusion with excisional debridement, patellar button was loose with significant nonviable fibrinous tissue underneath.  OR cultures pending  Recommendations  Continue cefazolin, plan for 6 weeks from     Rest of the management as per the primary team. Please call with questions or concerns.  Thank you for the consult  Rosiland Oz, MD Infectious Disease Physician [facility] [address] [place], [place] [address] Phone: [phone]  Fax: [phone]  __________________________________________________________________________________________________________ HPI and Hospital  Course: 66 year old male with PMH of right total hip arthroplasty March 2016 left hip total arthroplasty June 2011, lumbar laminectomy/decompression, right total knee arthroplasty February 4967 complicated by several months of worsening pain/swelling laterally status post I&D and evacuation of hematoma and abscess in April 27, 2021  with negative cx ( 4/10 status post IR aspiration of the hematoma, culture staph lugdunensis) status post 2 weeks of cefadroxil who is recently admitted for excision of right knee arthroplasty due to concerns for [place].  He had a joint aspiration done at orthopedics office on 6/26 with signs of pressure positive for Staphylococcus lugdunensis.   Patient is status post excision of total knee arthroplasty with antibiotic spacers on 7/13.  OR cultures sent.  Per our notes, fairly large purulent effusion with excisional debridement, patellar button was loose with significant nonviable fibrinous tissue underneath.  OR cultures pending    ROS: all systems reviewed with pertinent positives and negatives as listed above  Past Medical History:  Diagnosis Date   Acute meniscal tear of knee left   Aneurysm, ascending aorta ([place]) 4.8cm per ct 2011/ CARDIOLOGSIT--  DR Rollene Fare  LOV  10-07-2011  NOTE W/ CHART AND REQUEST LEXISCAN MYOVIEW AND ECHO TO BE FAXED.   ASYMPTOMATIC   Heart murmur MILD-- ASYMPTOMATIC   Hyperlipidemia    Hypertension    OA (osteoarthritis) of knee left   OSA (obstructive sleep apnea) NON-TOLERANT CPAP   no cpap PT REPORTS NEVER FINISHED TEST  Pre-diabetes    Sleep apnea    Tinnitus of both ears    Past Surgical History:  Procedure Laterality Date   BILATERAL IINGUINAL HERNIA REPAIR  1963   DOPPLER ECHOCARDIOGRAPHY  10/18/2011   EF >55 %   ENDOVENOUS ABLATION SAPHENOUS VEIN W/ LASER Right 12/30/2020   endovenous laser ablation right greater saphenous vein by Gae Gallop MD   INCISION AND DRAINAGE OF WOUND Right 04/27/2021   Procedure: IRRIGATION  AND DEBRIDEMENT OF HEMATOMA AND ABCESS ,ASPIRATE RIGHT KNEE;  Surgeon: Sydnee Cabal, MD;  Location: [facility];  Service: Orthopedics;  Laterality: Right;  REQUEST 3:30PM START TIME   LUMBAR LAMINECTOMY/DECOMPRESSION MICRODISCECTOMY N/A 02/24/2016   Procedure: Bilateral Decompression L4-5, Discectomy L4-5 ;  Surgeon: Susa Day, MD;  Location: [facility];  Service: Orthopedics;  Laterality: N/A;   NM MYOCAR PERF WALL MOTION  10/13/2011   EF 49 %,low risk study   RIGHT KNEE SURGERY X 2      ARTHROSCOPY    TONSILLECTOMY  1965   torn meniscus left knee surgery      TORN MENISCUS X 3 IN KNEES SURGERY      LEFT    TOTAL HIP ARTHROPLASTY  06/18/2009   OA LEFT HIP   TOTAL HIP ARTHROPLASTY Right 03/11/2014   Procedure: RIGHT TOTAL HIP ARTHROPLASTY ANTERIOR APPROACH;  Surgeon: [name], MD;  Location: [facility];  Service: Orthopedics;  Laterality: Right;   TOTAL KNEE ARTHROPLASTY Right 02/26/2020   Procedure: TOTAL KNEE ARTHROPLASTY;  Surgeon: Sydnee Cabal, MD;  Location: [facility];  Service: Orthopedics;  Laterality: Right;  adductor canal   TRANSTHORACIC ECHOCARDIOGRAM  09/11/2012   EF 55 to 60 %,mild aortic valve regurg,mild mitral valve regurg,lf and rt atriums mildly dilated   VARICOSE VEIN SURGERY Left 1980   veins removed from left leg  1994    Scheduled Meds:  acetaminophen  1,000 mg Oral Q6H   amLODipine  10 mg Oral Daily   aspirin  81 mg Oral BID   atorvastatin  40 mg Oral Daily   dexamethasone (DECADRON) injection  10 mg Intravenous Once   docusate sodium  100 mg Oral BID   ferrous sulfate  325 mg Oral TID PC   furosemide  40 mg Oral q AM   lisinopril  40 mg Oral Daily   meloxicam  15 mg Oral Daily   pregabalin  75 mg Oral BID   traZODone  50 mg Oral QHS   Continuous Infusions:  sodium chloride Stopped (07/23/21 0630)   methocarbamol (ROBAXIN) IV Stopped (07/22/21 1517)   tranexamic acid     PRN Meds:.acetaminophen, bisacodyl, diphenhydrAMINE, HYDROmorphone (DILAUDID)  injection, meclizine, menthol-cetylpyridinium **OR** phenol, methocarbamol **OR** methocarbamol (ROBAXIN) IV, metoCLOPramide **OR** metoCLOPramide (REGLAN) injection, ondansetron **OR** ondansetron (ZOFRAN) IV, oxyCODONE, oxyCODONE, polyethylene glycol  Allergies  Allergen Reactions   Penicillins Other (See Comments)    Childhood reaction- not recalled Has patient had a PCN reaction causing immediate rash, facial/tongue/throat swelling, SOB or lightheadedness with hypotension:unsure Has patient had a PCN reaction causing severe rash involving mucus membranes or skin necrosis:unsure Has patient had a PCN reaction that required hospitalization:unsure Has patient had a PCN reaction occurring within the last 10 years:No If all of the above answers are "NO", then may proceed with Cephalosporin use.    Quinolones Other (See Comments)    Patient was warned about not using Cipro and similar antibiotics. Recent studies have raised concern that fluoroquinolone antibiotics could be associated with an increased risk of aortic aneurysm  Fluoroquinolones have non-antimicrobial properties that might jeopardise the integrity of the extracellular matrix of the vascular wall In a  propensity score matched cohort study in [place], there was a 66% increased rate of aortic aneurysm or dissection associated with oral fluoroquinolone use, compared wit   Social History   Socioeconomic History   Marital status: Married    Spouse name: Not on file   Number of children: Not on file   Years of education: Not on file   Highest education level: Not on file  Occupational History   Not on file  Tobacco Use   Smoking status: Never   Smokeless tobacco: Former    Quit date: 09/11/1988  Vaping Use   Vaping Use: Never used  Substance and Sexual Activity   Alcohol use: Yes    Alcohol/week: 1.0 standard drink of alcohol    Types: 1 Standard drinks or equivalent per week    Comment: RARE   Drug use: No   Sexual  activity: Yes  Other Topics Concern   Not on file  Social History Narrative   Not on file   Social Determinants of Health   Financial Resource Strain: Not on file  Food Insecurity: Not on file  Transportation Needs: Not on file  Physical Activity: Not on file  Stress: Not on file  Social Connections: Not on file  Intimate Partner Violence: Not on file   Family History  Problem Relation Age of Onset   Diabetes Mother    Cancer Mother    Heart failure Other    Diabetes Other    Cancer Other    COPD Other    Colon cancer Neg Hx    Stomach cancer Neg Hx     Vitals BP 132/72 (BP Location: Left Arm)   Pulse 61   Temp 98.8 F (37.1 C) (Oral)   Resp 16   Ht '6\' 4"'$  (1.93 m)   Wt 134.7 kg   SpO2 100%   BMI 36.15 kg/m    Physical Exam Constitutional: Sitting up in the bed, morbidly obese    Comments:   Cardiovascular:     Rate and Rhythm: Normal rate and regular rhythm.     Heart sounds:   Pulmonary:     Effort: Pulmonary effort is normal.     Comments: Normal breath sounds  Abdominal:     Palpations: Abdomen is soft.     Tenderness: Nondistended and nontender  Musculoskeletal:        General: Rt Leg is wrapped in a bandage  Skin:    Comments: No obvious rashes  Neurological:     General: Grossly nonfocal, awake alert and oriented  Psychiatric:        Mood and Affect: Mood normal.    Pertinent Microbiology Results for orders placed or performed during the hospital encounter of 07/22/21  Aerobic/Anaerobic Culture w Gram Stain (surgical/deep wound)     Status: None (Preliminary result)   Collection Time: 07/22/21 12:28 PM   Specimen: Synovial, Right Knee; Body Fluid  Result Value Ref Range Status   Specimen Description   Final    SYNOVIAL RT KNEE Performed at [facility] [address], [place], [place] [address]    Special Requests   Final    NONE Performed at [facility], [place] [address], [place],  [place] [address]    Gram Stain   Final    ABUNDANT WBC PRESENT, PREDOMINANTLY PMN NO ORGANISMS SEEN Performed at [facility], [place] [address], [place], [place] [address]    Culture PENDING  Incomplete   Report Status PENDING  Incomplete    Pertinent Lab seen by me:    Latest Ref Rng & Units 07/23/2021    3:53 AM 07/20/2021    8:28 AM 04/26/2021    2:21 PM  CBC  WBC 4.0 - 10.5 K/uL 10.5  6.6  8.2   Hemoglobin 13.0 - 17.0 g/dL 10.2  12.9  11.4   Hematocrit 39.0 - 52.0 % 33.3  40.6  34.8   Platelets 150 - 400 K/uL 155  222  270       Latest Ref Rng & Units 07/23/2021    3:53 AM 07/20/2021    8:28 AM 04/26/2021    2:21 PM  CMP  Glucose 70 - 99 mg/dL 173  109  106   BUN 8 - 23 mg/dL '15  22  16   '$ Creatinine 0.61 - 1.24 mg/dL 0.76  0.83  0.94   Sodium 135 - 145 mmol/L 137  140  139   Potassium 3.5 - 5.1 mmol/L 4.1  4.7  3.7   Chloride 98 - 111 mmol/L 107  105  104   CO2 22 - 32 mmol/L '24  28  27   '$ Calcium 8.9 - 10.3 mg/dL 8.5  9.3  9.0   Total Protein 6.5 - 8.1 g/dL   7.0   Total Bilirubin 0.3 - 1.2 mg/dL   0.6   Alkaline Phos 38 - 126 U/L   56   AST 15 - 41 U/L   14   ALT 0 - 44 U/L   14     Pertinent Imagings/Other Imagings Plain films and CT images have been personally visualized and interpreted; radiology reports have been reviewed. Decision making incorporated into the Impression / Recommendations.   I spent more than 80 minutes for this patient encounter including review of prior medical records/discussing diagnostics and treatment plan with the patient/family/coordinate care with primary/other specialits with greater than 50% of time in face to face encounter.   Electronically signed by:   Rosiland Oz, MD Infectious Disease Physician [facility] for Infectious Disease Pager: [phone]

## 2021-07-23 NOTE — Progress Notes (Signed)
Physical Therapy Evaluation Patient Details Name: Travis Myers MRN: [id number] DOB: [birth year] Today's Date: 07/23/2021  History of Present Illness  66 yo male admitted for R knee infecton and now s/p  R TKR resection with spacer. Pt S/P I&D of hematoma/abscess and knee aspiration on 04/27/21. 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  Pt should progress well to dc home with family assist.     Recommendations for follow up therapy are one component of a multi-disciplinary discharge planning process, led by the attending physician.  Recommendations may be updated based on patient status, additional functional criteria and insurance authorization.  Follow Up Recommendations Follow physician's recommendations for discharge plan and follow up therapies      Assistance Recommended at Discharge Intermittent Supervision/Assistance  Patient can return home with the following  A little help with walking and/or transfers;A little help with bathing/dressing/bathroom;Help with stairs or ramp for entrance;Assistance with cooking/housework;Assist for transportation    Equipment Recommendations None recommended by PT  Recommendations for Other Services       Functional Status Assessment Patient has had a recent decline in their functional status and demonstrates the ability to make significant improvements in function in a reasonable and predictable amount of time.     Precautions / Restrictions Precautions Precautions: Knee;Fall Precaution Comments: Clarifying ROM  allowed with spacer Required Braces or Orthoses: Knee Immobilizer - Right Knee Immobilizer - Right: On when out of bed or walking Restrictions Weight Bearing Restrictions:  Yes RUE Weight Bearing: Partial weight bearing RUE Partial Weight Bearing Percentage or Pounds: 50% with KI      Mobility  Bed Mobility Overal bed mobility: Needs Assistance Bed Mobility: Supine to Sit     Supine to sit: Min assist     General bed mobility comments: min assist with R LE    Transfers Overall transfer level: Needs assistance Equipment used: Rolling walker (2 wheels) Transfers: Sit to/from Stand Sit to Stand: Min guard           General transfer comment: cues for LE management and use of UEs to self assist    Ambulation/Gait Ambulation/Gait assistance: Min guard Gait Distance (Feet): 90 Feet Assistive device: Rolling walker (2 wheels) Gait Pattern/deviations: Step-to pattern, Decreased step length - right, Decreased step length - left, Shuffle, Trunk flexed Gait velocity: decr     General Gait Details: cues for sequence and position from RW to insure PWB  Stairs            Wheelchair Mobility    Modified Rankin (Stroke Patients Only)       Balance Overall balance assessment: Mild deficits observed, not formally tested                                           Pertinent Vitals/Pain Pain Assessment Pain Assessment: 0-10 Pain Score: 4  Pain Location: R knee Pain Descriptors / Indicators: Aching, Sore Pain Intervention(s): Limited activity within patient's tolerance, Monitored during session, Premedicated before session, Ice applied    Home Living Family/patient expects to be discharged to:: Private residence Living Arrangements: Spouse/significant other Available Help at Discharge: Family Type  of Home: House Home Access: Stairs to enter Entrance Stairs-Rails: Right Entrance Stairs-Number of Steps: 3   Home Layout: One level Home Equipment: Conservation officer, nature (2 wheels);Cane - single point;BSC/3in1      Prior Function Prior Level of Function : Independent/Modified Independent             Mobility Comments:  mod I to Independent       Hand Dominance   Dominant Hand: Right    Extremity/Trunk Assessment   Upper Extremity Assessment Upper Extremity Assessment: Overall WFL for tasks assessed    Lower Extremity Assessment Lower Extremity Assessment: RLE deficits/detail    Cervical / Trunk Assessment Cervical / Trunk Assessment: Normal  Communication   Communication: No difficulties  Cognition Arousal/Alertness: Awake/alert Behavior During Therapy: WFL for tasks assessed/performed Overall Cognitive Status: Within Functional Limits for tasks assessed                                          General Comments      Exercises     Assessment/Plan    PT Assessment Patient needs continued PT services  PT Problem List Decreased strength;Decreased range of motion;Decreased activity tolerance;Decreased mobility;Decreased knowledge of use of DME;Pain       PT Treatment Interventions DME instruction;Gait training;Stair training;Functional mobility training;Therapeutic activities;Therapeutic exercise;Balance training;Patient/family education    PT Goals (Current goals can be found in the Care Plan section)  Acute Rehab PT Goals Patient Stated Goal: Regain IND PT Goal Formulation: With patient Time For Goal Achievement: 07/30/21 Potential to Achieve Goals: Good    Frequency 7X/week     Co-evaluation               AM-PAC PT "6 Clicks" Mobility  Outcome Measure Help needed turning from your back to your side while in a flat bed without using bedrails?: A Little Help needed moving from lying on your back to sitting on the side of a flat bed without using bedrails?: A Little Help needed moving to and from a bed to a chair (including a wheelchair)?: A Little Help needed standing up from a chair using your arms (e.g., wheelchair or bedside chair)?: A Little Help needed to walk in hospital room?: A Little Help needed climbing 3-5 steps with a railing? : A Lot 6  Click Score: 17    End of Session Equipment Utilized During Treatment: Gait belt;Right knee immobilizer Activity Tolerance: Patient tolerated treatment well Patient left: in bed;with call bell/phone within reach;with bed alarm set Nurse Communication: Mobility status PT Visit Diagnosis: Pain;Difficulty in walking, not elsewhere classified (R26.2) Pain - Right/Left: Right Pain - part of body: Knee    Time: 7353-2992 PT Time Calculation (min) (ACUTE ONLY): 42 min   Charges:   PT Evaluation $PT Eval Low Complexity: 1 Low PT Treatments $Gait Training: 8-22 mins        [place] Pager [phone] Office [phone]   Rayola Everhart 07/23/2021, 1:30 PM

## 2021-07-23 NOTE — Op Note (Unsigned)
NAME: Travis Myers, Travis Myers MEDICAL RECORD NO: [id number] ACCOUNT NO: [id number] DATE OF BIRTH: 06/21/1955 FACILITY: [id number] PHYSICIAN: Pietro Cassis. Alvan Dame, MD  Operative Report   DATE OF PROCEDURE: 07/22/2021  PREOPERATIVE DIAGNOSIS:  Infected right total knee arthroplasty.  POSTOPERATIVE DIAGNOSIS:  Infected right total knee arthroplasty.  PROCEDURE:  Resection of right total knee arthroplasty with placement of articulating antibiotic spacer.  SURGEON:  Pietro Cassis. Alvan Dame, MD  ASSISTANT:  Costella Hatcher, PA-C.  Note that Ms. Travis Myers was present for the entirety of the case from preoperative positioning, perioperative management of the operative extremity, general facilitation of the case and primary wound closure.  ANESTHESIA:  Regional plus spinal.  BLOOD LOSS:  About 500 mL  DRAINS:  None.  COMPLICATIONS:  None.  TOURNIQUET:  Up for 86 minutes at 225 mmHg.  INDICATIONS FOR PROCEDURE:  The patient is a 66 year old male with history of right total knee arthroplasty about 18 months ago.  He was recently referred for evaluation of persistent pain.  Based on his history, an aspiration was performed in the  office.  This was sent off for culture and evaluation.  Culture resulted in findings of positive for infection with Staphylococcus aureus infection.  Based on laboratory studies, his clinical presentation and aspiration, we discussed management.  I  reviewed with him that the definitive treatment would be best treated with a 2-stage technique based on current data and available research.  We reviewed the perioperative course including 6 weeks of IV antibiotics followed by a period of time off  antibiotics to make certain there was no recurrence of infection, monitoring, lab work.  We did discuss the risk of recurrence of infection.  We discussed the duration of treatment, which could be over the next 3 months to a year.  Consent was obtained  for management of his  pain.  DESCRIPTION OF PROCEDURE:  The patient was brought to the operative theater.  Once adequate anesthesia was established, he was positioned supine. Preoperative TXA and Decadron were given.  As  soon as we made an incision based on the preoperative  aspiration, we did give him 3 grams of Ancef.  The right lower extremity was prepped and draped in sterile fashion.  A timeout was performed identifying the patient, planned procedure, and extremity.  The leg was exsanguinated and tourniquet elevated to  225 mmHg.  His old incision was excised.  Soft tissue planes created.  I then made a median arthrotomy. Making the arthrotomy, we did encounter a fairly large purulent effusion.  The first portion of the case was all about the excision and exposure.  A  significant excisional debridement was carried out along the medial aspect of the knee, suprapatellar region laterally and around the patella.  As we worked around the patella, we did identify the patellar button was loose with significant nonviable  fibrous tissue underneath.  This was all debrided and a saw was then used to freshen up this cut to remove any nonviable tissue further. All the cement was removed from the patella.  Once this was done, I used a thin ACL saw and undermined the bone  cement interface on the tibia.  I was able to loosen this.  I then did the same on the bone cement interface of the femoral component and removed the femoral component without significant bone loss.  Once the femoral component was off, I was able to  subluxate the tibia forward enough to remove the tibia.  Once this  was done, all the tibial cement was removed.  We then irrigated the knee with normal saline solution, 3 liters.  I then sized and elected to use a size large KASM articulating cement  molds.  We prepared the tibia component and the dowels using three batches of cement with 3 grams of vancomycin and 3 of tobramycin.  We then made the femoral component mold  with 2 grams of vancomycin and 2 of tobramycin.  We rolled dowels and placed  dowels into the proximal tibia and distal femur.  When I placed one into the tibia, it was not fully solid and may represent a little bit of a challenge removing; however, we will figure this out at the time of his revision surgery.  The components were  positioned. The femoral component was held on the distal femur until the cement fully cured.  We then mixed a separate batch of cement to cement the tibial mold in place.  The knee was brought to extension while the cement fully cured.  The tourniquet  was let down after 86 minutes.  There was some hemostasis required in the posterolateral corner as to be somewhat expected based on the scar debridement; however, otherwise other than punctate synovial bleeding, there was nothing significant.  The  extensor mechanism was reapproximated using #1 Vicryl and #1 Stratafix suture.  The remainder of the wound was closed with 2-0 Vicryl and a running Monocryl stitch.  The knee was clean, dry and dressed sterilely using surgical glue and Aquacel dressing.   He was then brought to the recovery room with his knee in a knee immobilizer.  He tolerated the procedure well.  Postoperatively, we will have him PICC line placed.  We will have infectious disease help with antibiotics based on culture results and sensitivities.  We will see him back in the office in 2 weeks.  He will be partial weightbearing.  Knee immobilizer as  needed.  Otherwise, range of motion of the knee will be permissible, but not stressed.  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

## 2021-07-23 NOTE — TOC Transition Note (Signed)
Transition of Care [facility]) - CM/SW Discharge Note   Patient Details  Name: Travis Myers MRN: [id number] Date of Birth: 07/14/1955  Transition of Care [facility] CM/SW Contact:  Lennart Pall, LCSW Phone Number: 07/23/2021, 1:23 PM   Clinical Narrative:    Met with pt and wife today to review anticipated dc needs.  Both fully aware plan is for pt to dc home with IV abx and they have no agency preference for IV abx or [place] services.  Have placed referrals with [facility] for IV abx teaching/ management and with [facility] for [facility] visits.  Pt reports he has all needed DME at home.  All involved aware pt may be cleared for dc tomorrow.  At this point, no further TOC needs.  Please reorder if new needs arise.   Final next level of care: [place] Barriers to Discharge: Continued Medical Work up   Patient Goals and CMS Choice Patient states their goals for this hospitalization and ongoing recovery are:: return home      Discharge Placement                       Discharge Plan and Services                DME Arranged: N/A DME Agency: NA       HH Arranged: RN, IV Antibiotics HH Agency: [facility], [facility] Date [place]: 07/23/21 Time [place]: 1200 Representative spoke with at [place]: Dailey, RN;  Alvis Lemmings - Cindie  Social Determinants of Health (SDOH) Interventions     Readmission Risk Interventions    07/23/2021    1:22 PM  Readmission Risk Prevention Plan  Post Dischage Appt Complete  Medication Screening Complete  Transportation Screening Complete

## 2021-07-23 NOTE — Brief Op Note (Signed)
07/22/2021  7:47 AM  PATIENT:  Travis Myers  66 y.o. male  PRE-OPERATIVE DIAGNOSIS:  Infected right total knee arthroplasty  POST-OPERATIVE DIAGNOSIS:  Infected right total knee arthroplasty  PROCEDURE:  Procedure(s): EXCISIONAL TOTAL KNEE ARTHROPLASTY WITH ANTIBIOTIC SPACERS (Right)  SURGEON:  Surgeon(s) and Role:    Paralee Cancel, MD - Primary  PHYSICIAN ASSISTANT: Costella Hatcher, PA-C  ANESTHESIA:   regional and spinal  EBL:  500 mL   BLOOD ADMINISTERED:none  DRAINS: none   LOCAL MEDICATIONS USED:  NONE  SPECIMEN:  Source of Specimen:  right knee synovial fluid  DISPOSITION OF SPECIMEN:  PATHOLOGY  COUNTS:  YES  TOURNIQUET:   Total Tourniquet Time Documented: Thigh (Right) - 86 minutes Total: Thigh (Right) - 86 minutes   DICTATION: .Other Dictation: Dictation Number [id number]  PLAN OF CARE: Admit to inpatient   PATIENT DISPOSITION:  PACU - hemodynamically stable.   Delay start of Pharmacological VTE agent (>24hrs) due to surgical blood loss or risk of bleeding: no

## 2021-07-24 ENCOUNTER — Other Ambulatory Visit (HOSPITAL_COMMUNITY): Payer: Self-pay

## 2021-07-24 DIAGNOSIS — Z452 Encounter for adjustment and management of vascular access device: Secondary | ICD-10-CM

## 2021-07-24 DIAGNOSIS — T8453XA Infection and inflammatory reaction due to internal right knee prosthesis, initial encounter: Secondary | ICD-10-CM | POA: Diagnosis not present

## 2021-07-24 LAB — CBC
HCT: 32.2 % — ABNORMAL LOW (ref 39.0–52.0)
Hemoglobin: 10.2 g/dL — ABNORMAL LOW (ref 13.0–17.0)
MCH: 29.1 pg (ref 26.0–34.0)
MCHC: 31.7 g/dL (ref 30.0–36.0)
MCV: 92 fL (ref 80.0–100.0)
Platelets: 165 10*3/uL (ref 150–400)
RBC: 3.5 MIL/uL — ABNORMAL LOW (ref 4.22–5.81)
RDW: 15.2 % (ref 11.5–15.5)
WBC: 11.4 10*3/uL — ABNORMAL HIGH (ref 4.0–10.5)
nRBC: 0 % (ref 0.0–0.2)

## 2021-07-24 MED ORDER — CHLORHEXIDINE GLUCONATE CLOTH 2 % EX PADS
6.0000 | MEDICATED_PAD | Freq: Every day | CUTANEOUS | Status: DC
  Administered 2021-07-24: 6 via TOPICAL

## 2021-07-24 MED ORDER — HEPARIN SOD (PORK) LOCK FLUSH 100 UNIT/ML IV SOLN
250.0000 [IU] | INTRAVENOUS | Status: AC | PRN
  Administered 2021-07-24: 250 [IU]
  Filled 2021-07-24: qty 2.5

## 2021-07-24 MED ORDER — SODIUM CHLORIDE 0.9% FLUSH
10.0000 mL | Freq: Two times a day (BID) | INTRAVENOUS | Status: DC
  Administered 2021-07-24: 10 mL

## 2021-07-24 MED ORDER — SODIUM CHLORIDE 0.9% FLUSH
10.0000 mL | INTRAVENOUS | Status: DC | PRN
  Administered 2021-07-24: 10 mL

## 2021-07-24 NOTE — Progress Notes (Signed)
Meds delivered to patient. Pt alert and oriented. No questions on discharge instructions. Belongings sent home with pt.

## 2021-07-24 NOTE — Discharge Summary (Signed)
In most cases prophylactic antibiotics for Dental procdeures after total joint surgery are not necessary.  Exceptions are as follows:  1. History of prior total joint infection  2. Severely immunocompromised (Organ Transplant, cancer chemotherapy, Rheumatoid biologic meds such as [place])  3. Poorly controlled diabetes (A1C &gt; 8.0, blood glucose over 200)  If you have one of these conditions, contact your surgeon for an antibiotic prescription, prior to your dental procedure. Orthopedic Discharge Summary        Physician Discharge Summary  Patient ID: Travis Myers MRN: [id number] DOB/AGE: 24-Mar-1955 66 y.o.  Admit date: 07/22/2021 Discharge date: 07/24/2021   Procedures:  Procedure(s) (LRB): EXCISIONAL TOTAL KNEE ARTHROPLASTY WITH ANTIBIOTIC SPACERS (Right)  Attending Physician:  Dr. Adriana Mccallum  Admission Diagnoses:   Infected right total knee  Discharge Diagnoses:  infected right total knee   Past Medical History:  Diagnosis Date   Acute meniscal tear of knee left   Aneurysm, ascending aorta ([place]) 4.8cm per ct 2011/ CARDIOLOGSIT--  DR Rollene Fare  LOV  10-07-2011  NOTE W/ CHART AND REQUEST [facility]ISCAN MYOVIEW AND ECHO TO BE FAXED.   ASYMPTOMATIC   Heart murmur MILD-- ASYMPTOMATIC   Hyperlipidemia    Hypertension    OA (osteoarthritis) of knee left   OSA (obstructive sleep apnea) NON-TOLERANT CPAP   no cpap PT REPORTS NEVER FINISHED TEST    Pre-diabetes    Sleep apnea    Tinnitus of both ears     PCP: Cyndi Bender, PA-C   Discharged Condition: fair  Hospital Course:  Patient underwent the above stated procedure on 07/22/2021. Patient tolerated the procedure well and brought to the recovery room in good condition and subsequently to the floor. Patient had an uncomplicated hospital course and was stable for discharge.   Disposition: Discharge disposition: 01-Home or Self Care      with follow up in 2 weeks    Follow-up Information     Paralee Cancel, MD. Schedule an appointment as soon as possible for a visit in 2 week(s).   Specialty: Orthopedic Surgery Contact information: [address] [place] [address] [phone]         [facility] Follow up.   Specialty: Home Health Services Why: to provide home health nursing visits Contact information: [place] [address] [phone]         [facility] Follow up.   Why: to provide home IV antibiotics                Dental Antibiotics:  In most cases prophylactic antibiotics for Dental procdeures after total joint surgery are not necessary.  Exceptions are as follows:  1. History of prior total joint infection  2. Severely immunocompromised (Organ Transplant, cancer chemotherapy, Rheumatoid biologic meds such as [place])  3. Poorly controlled diabetes (A1C &gt; 8.0, blood glucose over 200)  If you have one of these conditions, contact your surgeon for an antibiotic prescription, prior to your dental procedure.  Discharge Instructions     [facility] pharmacist to adjust dose for Vancomycin, Aminoglycosides and other anti-infective therapies as requested by physician.   Complete by: As directed    [facility] to provide Cath Flo 25m   Complete by: As directed    Administer for PICC line occlusion and as ordered by physician for other access device issues.   Anaphylaxis Kit: Provided to treat any anaphylactic reaction to the medication being provided to the patient  if First Dose or when requested by physician   Complete by: As directed    Epinephrine 30m/ml vial / amp: Administer 0.329m(0.27m27msubcutaneously once for moderate to severe anaphylaxis, nurse to call physician and pharmacy when reaction occurs and call 911 if needed for immediate care   Diphenhydramine 29m65m IV vial: Administer 25-29mg74mIM PRN for first dose reaction, rash, itching, mild reaction, nurse to  call physician and pharmacy when reaction occurs   Sodium Chloride 0.9% NS 500ml 20mAdminister if needed for hypovolemic blood pressure drop or as ordered by physician after call to physician with anaphylactic reaction   Call MD / Call 911   Complete by: As directed    If you experience chest pain or shortness of breath, CALL 911 and be transported to the hospital emergency room.  If you develope a fever above 101 F, pus (white drainage) or increased drainage or redness at the wound, or calf pain, call your surgeon's office.   Call MD / Call 911   Complete by: As directed    If you experience chest pain or shortness of breath, CALL 911 and be transported to the hospital emergency room.  If you develope a fever above 101 F, pus (white drainage) or increased drainage or redness at the wound, or calf pain, call your surgeon's office.   Change dressing   Complete by: As directed    Maintain surgical dressing until follow up in the clinic. If the edges start to pull up, may reinforce with tape. If the dressing is no longer working, may remove and cover with gauze and tape, but must keep the area dry and clean.  Call with any questions or concerns.   Change dressing on IV access line weekly and PRN   Complete by: As directed    Constipation Prevention   Complete by: As directed    Drink plenty of fluids.  Prune juice may be helpful.  You may use a stool softener, such as Colace (over the counter) 100 mg twice a day.  Use MiraLax (over the counter) for constipation as needed.   Constipation Prevention   Complete by: As directed    Drink plenty of fluids.  Prune juice may be helpful.  You may use a stool softener, such as Colace (over the counter) 100 mg twice a day.  Use MiraLax (over the counter) for constipation as needed.   Diet - low sodium heart healthy   Complete by: As directed    Diet - low sodium heart healthy   Complete by: As directed    Flush IV access with Sodium Chloride 0.9% and  Heparin 10 units/ml or 100 units/ml   Complete by: As directed    Home infusion instructions - [facility]   Complete by: As directed    Instructions: Flush IV access with Sodium Chloride 0.9% and Heparin 10units/ml or 100units/ml   Change dressing on IV access line: Weekly and PRN   Instructions Cath Flo 2mg: A36mnister for PICC Line occlusion and as ordered by physician for other access device   [facility] pharmacist to adjust dose for: Vancomycin, Aminoglycosides and other anti-infective therapies as requested by physician   Increase activity slowly as tolerated   Complete by: As directed    Partial weight bearing with assist device as directed.   Increase activity slowly as tolerated   Complete by: As directed    Method of administration may be changed at the discretion of home infusion  pharmacist based upon assessment of the patient and/or caregiver's ability to self-administer the medication ordered   Complete by: As directed    Post-operative opioid taper instructions:   Complete by: As directed    POST-OPERATIVE OPIOID TAPER INSTRUCTIONS: It is important to wean off of your opioid medication as soon as possible. If you do not need pain medication after your surgery it is ok to stop day one. Opioids include: Codeine, Hydrocodone(Norco, Vicodin), Oxycodone(Percocet, oxycontin) and hydromorphone amongst others.  Long term and even short term use of opiods can cause: Increased pain response Dependence Constipation Depression Respiratory depression And more.  Withdrawal symptoms can include Flu like symptoms Nausea, vomiting And more Techniques to manage these symptoms Hydrate well Eat regular healthy meals Stay active Use relaxation techniques(deep breathing, meditating, yoga) Do Not substitute Alcohol to help with tapering If you have been on opioids for less than two weeks and do not have pain than it is ok to stop all together.  Plan to wean off of  opioids This plan should start within one week post op of your joint replacement. Maintain the same interval or time between taking each dose and first decrease the dose.  Cut the total daily intake of opioids by one tablet each day Next start to increase the time between doses. The last dose that should be eliminated is the evening dose.      Post-operative opioid taper instructions:   Complete by: As directed    POST-OPERATIVE OPIOID TAPER INSTRUCTIONS: It is important to wean off of your opioid medication as soon as possible. If you do not need pain medication after your surgery it is ok to stop day one. Opioids include: Codeine, Hydrocodone(Norco, Vicodin), Oxycodone(Percocet, oxycontin) and hydromorphone amongst others.  Long term and even short term use of opiods can cause: Increased pain response Dependence Constipation Depression Respiratory depression And more.  Withdrawal symptoms can include Flu like symptoms Nausea, vomiting And more Techniques to manage these symptoms Hydrate well Eat regular healthy meals Stay active Use relaxation techniques(deep breathing, meditating, yoga) Do Not substitute Alcohol to help with tapering If you have been on opioids for less than two weeks and do not have pain than it is ok to stop all together.  Plan to wean off of opioids This plan should start within one week post op of your joint replacement. Maintain the same interval or time between taking each dose and first decrease the dose.  Cut the total daily intake of opioids by one tablet each day Next start to increase the time between doses. The last dose that should be eliminated is the evening dose.      TED hose   Complete by: As directed    Use stockings (TED hose) for 2 weeks on both leg(s).  You may remove them at night for sleeping.       Allergies as of 07/24/2021       Reactions   Penicillins Other (See Comments)   Childhood reaction- not recalled Has patient  had a PCN reaction causing immediate rash, facial/tongue/throat swelling, SOB or lightheadedness with hypotension:unsure Has patient had a PCN reaction causing severe rash involving mucus membranes or skin necrosis:unsure Has patient had a PCN reaction that required hospitalization:unsure Has patient had a PCN reaction occurring within the last 10 years:No If all of the above answers are "NO", then may proceed with Ceph.use. Tolerated ancef 07/22/21   Quinolones Other (See Comments)   Patient was warned about not using Cipro  and similar antibiotics. Recent studies have raised concern that fluoroquinolone antibiotics could be associated with an increased risk of aortic aneurysm Fluoroquinolones have non-antimicrobial properties that might jeopardise the integrity of the extracellular matrix of the vascular wall In a  propensity score matched cohort study in [place], there was a 66% increased rate of aortic aneurysm or dissection associated with oral fluoroquinolone use, compared wit        Medication List     STOP taking these medications    diclofenac 75 MG EC tablet Commonly known as: VOLTAREN   traMADol 50 MG tablet Commonly known as: ULTRAM       TAKE these medications    acetaminophen 500 MG tablet Commonly known as: TYLENOL Take 500-1,000 mg by mouth every 6 (six) hours as needed for moderate pain.   amLODipine 10 MG tablet Commonly known as: NORVASC Take 10 mg by mouth daily.   aspirin 81 MG chewable tablet Chew 1 tablet (81 mg total) by mouth 2 (two) times daily for 28 days.   atorvastatin 40 MG tablet Commonly known as: LIPITOR Take 1 tablet (40 mg total) by mouth daily.   ceFAZolin  IVPB Commonly known as: ANCEF Inject 2 g into the vein every 8 (eight) hours. Indication:  R-knee PJI First Dose: Yes Last Day of Therapy:  09/02/21 Labs - Once weekly:  CBC/D and BMP, Labs - Every other week:  ESR and CRP Method of administration: IV Push Method of  administration may be changed at the discretion of home infusion pharmacist based upon assessment of the patient and/or caregiver's ability to self-administer the medication ordered.   docusate sodium 100 MG capsule Commonly known as: COLACE Take 1 capsule (100 mg total) by mouth 2 (two) times daily.   furosemide 20 MG tablet Commonly known as: LASIX Take 40 mg by mouth in the morning.   lisinopril 40 MG tablet Commonly known as: ZESTRIL Take 40 mg by mouth daily.   meclizine 25 MG tablet Commonly known as: ANTIVERT Take 25 mg by mouth 3 (three) times daily as needed for dizziness.   meloxicam 15 MG tablet Commonly known as: MOBIC Take 15 mg by mouth daily.   methocarbamol 500 MG tablet Commonly known as: ROBAXIN Take 1 tablet (500 mg total) by mouth every 6 (six) hours as needed for muscle spasms.   oxyCODONE 5 MG immediate release tablet Commonly known as: Oxy IR/ROXICODONE Take 1 tablet by mouth every 4 hours as needed. Take 2 tablets only if needed  for severe pain.   polyethylene glycol 17 g packet Commonly known as: MIRALAX / GLYCOLAX Take 17 g by mouth daily as needed for mild constipation.   pregabalin 75 MG capsule Commonly known as: LYRICA Take 75 mg by mouth 2 (two) times daily.   traZODone 50 MG tablet Commonly known as: DESYREL Take 50 mg by mouth at bedtime.   triamcinolone cream 0.1 % Commonly known as: KENALOG Apply 1 application  topically daily.   vitamin B-12 50 MCG tablet Commonly known as: CYANOCOBALAMIN Take 50 mcg by mouth daily.   vitamin C 500 MG tablet Commonly known as: ASCORBIC ACID Take 500 mg by mouth daily.               Discharge Care Instructions  (From admission, onward)           Start     Ordered   07/23/21 0000  Change dressing on IV access line weekly and PRN  (Home infusion instructions -  [facility] )        07/23/21 1033   07/23/21 0000  Change dressing       Comments: Maintain surgical  dressing until follow up in the clinic. If the edges start to pull up, may reinforce with tape. If the dressing is no longer working, may remove and cover with gauze and tape, but must keep the area dry and clean.  Call with any questions or concerns.   07/23/21 1419              Signed: Ventura Bruns 07/24/2021, 9:23 AM  [facility] is now [employer] [facility] [address], [place], [place], [place] [address] Phone: [place]

## 2021-07-24 NOTE — Plan of Care (Signed)
  Problem: Education: Goal: Knowledge of General Education information will improve Description Including pain rating scale, medication(s)/side effects and non-pharmacologic comfort measures Outcome: Progressing   Problem: Nutrition: Goal: Adequate nutrition will be maintained Outcome: Progressing   Problem: Pain Managment: Goal: General experience of comfort will improve Outcome: Progressing   

## 2021-07-24 NOTE — Progress Notes (Signed)
Physical Therapy Treatment Patient Details Name: Travis Myers MRN: [id number] DOB: 10/21/1955 Today's Date: 07/24/2021   History of Present Illness 66 yo male admitted for R knee infecton and now s/p  R TKR resection with spacer. Pt S/P I&D of hematoma/abscess and knee aspiration on 04/27/21. PMH: R TKA 02/26/20, HTN, HLD, pre-DM, sleep apnea, AAA, meniscal tear L knee, R THA 2016, L THA 2011 lumbar laminectomy/decompression 2018    PT Comments    Pt in good spirits, progressing well with mobility and hopeful for dc home this date.  Pt performed gentle HEP with written instruction provided and reviewed, ambulated in hall, and negotiated stairs with min difficulty.   Recommendations for follow up therapy are one component of a multi-disciplinary discharge planning process, led by the attending physician.  Recommendations may be updated based on patient status, additional functional criteria and insurance authorization.  Follow Up Recommendations  Follow physician's recommendations for discharge plan and follow up therapies     Assistance Recommended at Discharge Intermittent Supervision/Assistance  Patient can return home with the following A little help with walking and/or transfers;A little help with bathing/dressing/bathroom;Help with stairs or ramp for entrance;Assistance with cooking/housework;Assist for transportation   Equipment Recommendations  None recommended by PT    Recommendations for Other Services       Precautions / Restrictions Precautions Precautions: Knee;Fall Required Braces or Orthoses: Knee Immobilizer - Right Knee Immobilizer - Right: On when out of bed or walking Restrictions Weight Bearing Restrictions: Yes RUE Weight Bearing: Partial weight bearing RUE Partial Weight Bearing Percentage or Pounds: 50% with KI     Mobility  Bed Mobility Overal bed mobility: Needs Assistance Bed Mobility: Supine to Sit, Sit to Supine     Supine to sit: Min  assist Sit to supine: Min assist   General bed mobility comments: min assist with R LE    Transfers Overall transfer level: Needs assistance Equipment used: Rolling walker (2 wheels) Transfers: Sit to/from Stand Sit to Stand: Supervision           General transfer comment: cues for LE management and use of UEs to self assist    Ambulation/Gait Ambulation/Gait assistance: Min guard, Supervision Gait Distance (Feet): 120 Feet Assistive device: Rolling walker (2 wheels) Gait Pattern/deviations: Step-to pattern, Decreased step length - right, Decreased step length - left, Shuffle, Trunk flexed Gait velocity: decr     General Gait Details: min cues for sequence and position from RW to insure PWB   Stairs Stairs: Yes Stairs assistance: Min guard Stair Management: One rail Right, Step to pattern, Forwards, With crutches Number of Stairs: 5 General stair comments: cues for sequence and foot/crutch placement   Wheelchair Mobility    Modified Rankin (Stroke Patients Only)       Balance Overall balance assessment: Mild deficits observed, not formally tested                                          Cognition Arousal/Alertness: Awake/alert Behavior During Therapy: WFL for tasks assessed/performed Overall Cognitive Status: Within Functional Limits for tasks assessed                                          Exercises General Exercises - Lower Extremity Ankle Circles/Pumps: AROM, Both, 15 reps,  Supine Quad Sets: AROM, Both, 10 reps, Supine Heel Slides: AAROM, Right, 10 reps, Supine Hip ABduction/ADduction: AAROM, Right, 10 reps, Supine Straight Leg Raises: AAROM, Right, 10 reps, Supine    General Comments        Pertinent Vitals/Pain Pain Assessment Pain Assessment: 0-10 Pain Score: 4  Pain Location: R knee Pain Descriptors / Indicators: Aching, Sore Pain Intervention(s): Premedicated before session, Monitored during  session, Limited activity within patient's tolerance, Ice applied    Home Living                          Prior Function            PT Goals (current goals can now be found in the care plan section) Acute Rehab PT Goals Patient Stated Goal: Regain IND PT Goal Formulation: With patient Time For Goal Achievement: 07/30/21 Potential to Achieve Goals: Good Progress towards PT goals: Progressing toward goals    Frequency    7X/week      PT Plan Current plan remains appropriate    Co-evaluation              AM-PAC PT "6 Clicks" Mobility   Outcome Measure  Help needed turning from your back to your side while in a flat bed without using bedrails?: A Little Help needed moving from lying on your back to sitting on the side of a flat bed without using bedrails?: A Little Help needed moving to and from a bed to a chair (including a wheelchair)?: A Little Help needed standing up from a chair using your arms (e.g., wheelchair or bedside chair)?: A Little Help needed to walk in hospital room?: A Little Help needed climbing 3-5 steps with a railing? : A Little 6 Click Score: 18    End of Session Equipment Utilized During Treatment: Gait belt;Right knee immobilizer Activity Tolerance: Patient tolerated treatment well Patient left: in bed;with call bell/phone within reach;with bed alarm set Nurse Communication: Mobility status PT Visit Diagnosis: Pain;Difficulty in walking, not elsewhere classified (R26.2) Pain - Right/Left: Right Pain - part of body: Knee     Time: 5003-7048 PT Time Calculation (min) (ACUTE ONLY): 59 min  Charges:  $Gait Training: 8-22 mins $Therapeutic Exercise: 8-22 mins $Therapeutic Activity: 8-22 mins                     [place] Pager [phone] Office [phone]    Cain Fitzhenry 07/24/2021, 1:58 PM

## 2021-07-24 NOTE — TOC Transition Note (Signed)
Transition of Care [facility]) - CM/SW Discharge Note   Patient Details  Name: Travis Myers MRN: [id number] Date of Birth: Feb 02, 1955  [facility] CM/SW Contact:  Ross Ludwig, LCSW Phone Number: 07/24/2021, 10:33 AM   Clinical Narrative:     Patient will be going home with home health RN through [facility] and [facility] for IV antibiotics.  CSW signing off please reconsult with any other social work needs, home health agency has been notified of planned discharge.    Final next level of care: [place] Barriers to Discharge: Barriers Resolved   Patient Goals and CMS Choice Patient states their goals for this hospitalization and ongoing recovery are:: To return home with home health. CMS Medicare.gov Compare Post Acute Care list provided to:: Patient Choice offered to / list presented to : Patient  Discharge Placement                       Discharge Plan and Services                DME Arranged: IV pump/equipment DME Agency: Other - Comment Roel Cluck) Date DME Agency Contacted: 07/24/21 Time DME Agency Contacted: [phone] Representative spoke with at DME Agency: [place]: RN [place] Agency: [place] Date [place]: 07/23/21 Time [place]: 1033 Representative spoke with at [place]: [place], RN;  [place]  Social Determinants of Health (SDOH) Interventions     Readmission Risk Interventions    07/23/2021    1:22 PM  Readmission Risk Prevention Plan  Post Dischage Appt Complete  Medication Screening Complete  Transportation Screening Complete

## 2021-07-24 NOTE — Progress Notes (Signed)
Peripherally Inserted Central Catheter Placement  The IV Nurse has discussed with the patient and/or persons authorized to consent for the patient, the purpose of this procedure and the potential benefits and risks involved with this procedure.  The benefits include less needle sticks, lab draws from the catheter, and the patient may be discharged home with the catheter. Risks include, but not limited to, infection, bleeding, blood clot (thrombus formation), and puncture of an artery; nerve damage and irregular heartbeat and possibility to perform a PICC exchange if needed/ordered by physician.  Alternatives to this procedure were also discussed.  Bard Power PICC patient education guide, fact sheet on infection prevention and patient information card has been provided to patient /or left at bedside.    PICC Placement Documentation  PICC Single Lumen 34/91/79 Right Basilic 46 cm 0 cm (Active)  Indication for Insertion or Continuance of Line Home intravenous therapies (PICC only) 07/24/21 1148  Exposed Catheter (cm) 0 cm 07/24/21 1148  Site Assessment Clean, Dry, Intact 07/24/21 1148  Line Status Flushed;Saline locked;Blood return noted 07/24/21 1148  Dressing Type Transparent;Securing device 07/24/21 1148  Dressing Status Antimicrobial disc in place 07/24/21 1148  Safety Lock Not Applicable 15/05/69 7948  Line Care Connections checked and tightened 07/24/21 1148  Line Adjustment (NICU/IV Team Only) No 07/24/21 1148  Dressing Intervention New dressing 07/24/21 1148  Dressing Change Due 07/31/21 07/24/21 [place] 07/24/2021, 11:50 AM

## 2021-07-24 NOTE — Progress Notes (Signed)
OPAT  Diagnosis: RT knee PJI  Culture Result: MS staph lugdunensis  Allergies  Allergen Reactions   Penicillins Other (See Comments)    Childhood reaction- not recalled Has patient had a PCN reaction causing immediate rash, facial/tongue/throat swelling, SOB or lightheadedness with hypotension:unsure Has patient had a PCN reaction causing severe rash involving mucus membranes or skin necrosis:unsure Has patient had a PCN reaction that required hospitalization:unsure Has patient had a PCN reaction occurring within the last 10 years:No If all of the above answers are "NO", then may proceed with Ceph.use. Tolerated ancef 07/22/21     Quinolones Other (See Comments)    Patient was warned about not using Cipro and similar antibiotics. Recent studies have raised concern that fluoroquinolone antibiotics could be associated with an increased risk of aortic aneurysm Fluoroquinolones have non-antimicrobial properties that might jeopardise the integrity of the extracellular matrix of the vascular wall In a  propensity score matched cohort study in [place], there was a 66% increased rate of aortic aneurysm or dissection associated with oral fluoroquinolone use, compared wit    OPAT Orders Discharge antibiotics to be given via PICC line Discharge antibiotics: cefazolin 2g iv q 8 hrs Per pharmacy protocol  Duration: 6 weeks  End Date: 09/02/21  [facility] Care Per Protocol:  Home health RN for IV administration and teaching; PICC line care and labs.    Labs weekly while on IV antibiotics: X__ CBC with differential X__ BMP __ CMP X_ CRP and ESR every 2  weeks  __ Vancomycin trough __ CK  __ Please pull PIC at completion of IV antibiotics X__ Please leave PIC in place until doctor has seen patient or been notified  Fax weekly labs to [phone]  Clinic Follow Up Appt: 2-3 weeks   Rosiland Oz, MD Infectious Disease Physician [facility] [address] [place], [place] [address] Phone: [phone]  Fax: [phone]

## 2021-07-24 NOTE — Progress Notes (Signed)
   Subjective: 2 Days Post-Op Procedure(s) (LRB): EXCISIONAL TOTAL KNEE ARTHROPLASTY WITH ANTIBIOTIC SPACERS (Right)  Pt c/o mild to moderate pain but improved today Denies any new symptoms or issues Appreciate ID input Otherwise doing fair Patient reports pain as moderate.  Objective:   VITALS:   Vitals:   07/24/21 0153 07/24/21 0633  BP: 124/70 126/77  Pulse: 60 64  Resp: 18 17  Temp: 97.6 F (36.4 C) 98.6 F (37 C)  SpO2: 99% 100%    Right knee dressing intact Nv intact distally No rashes  Mild edema distally bilaterally   LABS Recent Labs    07/23/21 0353 07/24/21 0314  HGB 10.2* 10.2*  HCT 33.3* 32.2*  WBC 10.5 11.4*  PLT 155 165    Recent Labs    07/23/21 0353  NA 137  K 4.1  BUN 15  CREATININE 0.76  GLUCOSE 173*     Assessment/Plan: 2 Days Post-Op Procedure(s) (LRB): EXCISIONAL TOTAL KNEE ARTHROPLASTY WITH ANTIBIOTIC SPACERS (Right) Plan to d/c today Patient has improved with therapy Continue antibiotics F/u in the office in 2 weeks Pain management as needed   Kathrynn Speed, [place] [facility] now [employer] [facility] [address] [place], [place], [place] [address] Phone: [phone] [email or web address] Facebook  [employer]

## 2021-07-24 NOTE — Progress Notes (Signed)
Patient discharge complete, awaiting meds to beds to deliver medications and for wife to come pick him up.

## 2021-07-24 NOTE — Progress Notes (Signed)
Patient verbalized understanding of discharge instructions as well as all discharge r/t to PICC/antibiotic administration/infection prevention. OPAT RN teaching was completed yesterday. Patient states he already has a walker at home.

## 2021-07-26 ENCOUNTER — Encounter (HOSPITAL_COMMUNITY): Payer: Self-pay | Admitting: Orthopedic Surgery

## 2021-07-26 ENCOUNTER — Other Ambulatory Visit: Payer: Self-pay | Admitting: Cardiovascular Disease

## 2021-07-27 LAB — AEROBIC/ANAEROBIC CULTURE W GRAM STAIN (SURGICAL/DEEP WOUND)

## 2021-08-06 ENCOUNTER — Other Ambulatory Visit: Payer: Self-pay

## 2021-08-06 ENCOUNTER — Ambulatory Visit (INDEPENDENT_AMBULATORY_CARE_PROVIDER_SITE_OTHER): Payer: BC Managed Care – PPO | Admitting: Infectious Diseases

## 2021-08-06 ENCOUNTER — Encounter: Payer: Self-pay | Admitting: Infectious Diseases

## 2021-08-06 VITALS — BP 109/74 | HR 88 | Temp 98.2°F | Wt 296.0 lb

## 2021-08-06 DIAGNOSIS — T8453XD Infection and inflammatory reaction due to internal right knee prosthesis, subsequent encounter: Secondary | ICD-10-CM

## 2021-08-06 DIAGNOSIS — M79661 Pain in right lower leg: Secondary | ICD-10-CM

## 2021-08-06 DIAGNOSIS — Z452 Encounter for adjustment and management of vascular access device: Secondary | ICD-10-CM | POA: Diagnosis not present

## 2021-08-06 DIAGNOSIS — M7989 Other specified soft tissue disorders: Secondary | ICD-10-CM

## 2021-08-06 DIAGNOSIS — Z79899 Other long term (current) drug therapy: Secondary | ICD-10-CM

## 2021-08-06 NOTE — Progress Notes (Signed)
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   Infection of right knee ([place]) 04/26/2021   Infected hematoma    Osteoarthritis of right knee 02/26/2020   Aortic regurgitation 07/26/2018   Spinal stenosis of lumbar region 02/24/2016   HNP (herniated nucleus pulposus), lumbar 02/24/2016   Obese 03/13/2014   S/P right THA, AA 03/11/2014   Preoperative clearance 02/28/2014   Thoracic aortic aneurysm ([place]) 03/14/2013   Essential hypertension 03/14/2013   Hyperlipidemia 03/14/2013    Patient's Medications  New Prescriptions   No medications on file  Previous Medications   ACETAMINOPHEN (TYLENOL) 500 MG TABLET    Take 500-1,000 mg by mouth every 6 (six) hours as needed for moderate pain.   AMLODIPINE (NORVASC) 10 MG TABLET    Take 10 mg by mouth daily.   ASPIRIN 81 MG CHEWABLE TABLET    Chew 1 tablet (81 mg total) by mouth 2 (two) times daily for 28 days.   ATORVASTATIN (LIPITOR) 40 MG TABLET    TAKE 1 TABLET BY MOUTH EVERY DAY   CEFAZOLIN (ANCEF) IVPB    Inject 2 g into the vein every 8 (eight) hours. Indication:  R-knee PJI First Dose: Yes Last Day of Therapy:  09/02/21 Labs - Once weekly:  CBC/D and BMP, Labs - Every other week:  ESR and CRP Method of administration: IV Push Method of administration may be changed at the discretion of home infusion pharmacist based upon assessment of the patient and/or caregiver's ability to self-administer the medication ordered.   DOCUSATE SODIUM (COLACE) 100 MG CAPSULE    Take 1 capsule (100 mg total) by mouth 2 (two) times daily.   FUROSEMIDE (LASIX) 20 MG TABLET    Take 40 mg by mouth in the morning.   LISINOPRIL (PRINIVIL,ZESTRIL) 40 MG TABLET    Take 40 mg by mouth daily.   MECLIZINE (ANTIVERT) 25 MG TABLET    Take 25 mg by mouth 3 (three) times daily as needed for dizziness.   MELOXICAM (MOBIC) 15 MG TABLET    Take  15 mg by mouth daily.   METHOCARBAMOL (ROBAXIN) 500 MG TABLET    Take 1 tablet (500 mg total) by mouth every 6 (six) hours as needed for muscle spasms.   OXYCODONE (OXY IR/ROXICODONE) 5 MG IMMEDIATE RELEASE TABLET    Take 1 tablet by mouth every 4 hours as needed. Take 2 tablets only if needed  for severe pain.   POLYETHYLENE GLYCOL (MIRALAX / GLYCOLAX) 17 G PACKET    Take 17 g by mouth daily as needed for mild constipation.   PREGABALIN (LYRICA) 75 MG CAPSULE    Take 75 mg by mouth 2 (two) times daily.   TRAZODONE (DESYREL) 50 MG TABLET    Take 50 mg by mouth at bedtime.   TRIAMCINOLONE (KENALOG) 0.1 %    Apply 1 application  topically daily.   VITAMIN B-12 (CYANOCOBALAMIN) 50 MCG TABLET    Take 50 mcg by mouth daily.   VITAMIN C (ASCORBIC ACID) 500 MG TABLET    Take 500 mg by mouth daily.  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 Getting IV cefazolin through picc without missed doses or concerns. Denies fevers, chills. Denies nausea, vomiting and diarrhea. He  however complains of persistent swelling at the rt knee joint including the rt leg and associated pain since surgery. He also feels his bilateral toes in both feet are stiff and hurt at times. He tells me he saw Dr Alvan Dame on Monday 7/24 when he had the swelling and pain in the rt leg and knee and was thought to be expected post operative. Denies any redness/tenderness in the rt knee joint and leg.   Review of Systems: all systems reviewed with pertinent positives and negatives as listed above  Past Medical History:  Diagnosis Date   Acute meniscal tear of knee left   Aneurysm, ascending aorta ([place]) 4.8cm per ct 2011/ CARDIOLOGSIT--  DR Rollene Fare  LOV  10-07-2011  NOTE W/ CHART AND REQUEST LEXISCAN MYOVIEW AND ECHO TO BE FAXED.   ASYMPTOMATIC   Heart murmur MILD-- ASYMPTOMATIC   Hyperlipidemia    Hypertension    OA (osteoarthritis) of  knee left   OSA (obstructive sleep apnea) NON-TOLERANT CPAP   no cpap PT REPORTS NEVER FINISHED TEST    Pre-diabetes    Sleep apnea    Tinnitus of both ears    Past Surgical History:  Procedure Laterality Date   BILATERAL IINGUINAL HERNIA REPAIR  1963   DOPPLER ECHOCARDIOGRAPHY  10/18/2011   EF >55 %   ENDOVENOUS ABLATION SAPHENOUS VEIN W/ LASER Right 12/30/2020   endovenous laser ablation right greater saphenous vein by Gae Gallop MD   EXCISIONAL TOTAL KNEE ARTHROPLASTY WITH ANTIBIOTIC SPACERS Right 07/22/2021   Procedure: EXCISIONAL TOTAL KNEE ARTHROPLASTY WITH ANTIBIOTIC SPACERS;  Surgeon: Paralee Cancel, MD;  Location: [facility];  Service: Orthopedics;  Laterality: Right;   INCISION AND DRAINAGE OF WOUND Right 04/27/2021   Procedure: IRRIGATION AND DEBRIDEMENT OF HEMATOMA AND ABCESS ,ASPIRATE RIGHT KNEE;  Surgeon: Sydnee Cabal, MD;  Location: [facility];  Service: Orthopedics;  Laterality: Right;  REQUEST 3:30PM START TIME   LUMBAR LAMINECTOMY/DECOMPRESSION MICRODISCECTOMY N/A 02/24/2016   Procedure: Bilateral Decompression L4-5, Discectomy L4-5 ;  Surgeon: Susa Day, MD;  Location: [facility];  Service: Orthopedics;  Laterality: N/A;   NM MYOCAR PERF WALL MOTION  10/13/2011   EF 49 %,low risk study   RIGHT KNEE SURGERY X 2      ARTHROSCOPY    TONSILLECTOMY  1965   torn meniscus left knee surgery      TORN MENISCUS X 3 IN KNEES SURGERY      LEFT    TOTAL HIP ARTHROPLASTY  06/18/2009   OA LEFT HIP   TOTAL HIP ARTHROPLASTY Right 03/11/2014   Procedure: RIGHT TOTAL HIP ARTHROPLASTY ANTERIOR APPROACH;  Surgeon: [name], MD;  Location: [facility];  Service: Orthopedics;  Laterality: Right;   TOTAL KNEE ARTHROPLASTY Right 02/26/2020   Procedure: TOTAL KNEE ARTHROPLASTY;  Surgeon: Sydnee Cabal, MD;  Location: [facility];  Service: Orthopedics;  Laterality: Right;  adductor canal   TRANSTHORACIC ECHOCARDIOGRAM  09/11/2012   EF 55 to 60 %,mild aortic valve regurg,mild mitral valve  regurg,lf and rt atriums mildly dilated   VARICOSE VEIN SURGERY Left 1980   veins removed from left leg  1994     Social History   Tobacco Use   Smoking status: Never   Smokeless tobacco: Former    Quit date: 09/11/1988  Vaping Use   Vaping Use: Never used  Substance Use Topics   Alcohol use: Yes    Alcohol/week: 1.0 standard drink of alcohol    Types: 1 Standard drinks or equivalent per week  Comment: RARE   Drug use: No    Family History  Problem Relation Age of Onset   Diabetes Mother    Cancer Mother    Heart failure Other    Diabetes Other    Cancer Other    COPD Other    Colon cancer Neg Hx    Stomach cancer Neg Hx     Allergies  Allergen Reactions   Penicillins Other (See Comments)    Childhood reaction- not recalled Has patient had a PCN reaction causing immediate rash, facial/tongue/throat swelling, SOB or lightheadedness with hypotension:unsure Has patient had a PCN reaction causing severe rash involving mucus membranes or skin necrosis:unsure Has patient had a PCN reaction that required hospitalization:unsure Has patient had a PCN reaction occurring within the last 10 years:No If all of the above answers are "NO", then may proceed with Ceph.use. Tolerated ancef 07/22/21     Quinolones Other (See Comments)    Patient was warned about not using Cipro and similar antibiotics. Recent studies have raised concern that fluoroquinolone antibiotics could be associated with an increased risk of aortic aneurysm Fluoroquinolones have non-antimicrobial properties that might jeopardise the integrity of the extracellular matrix of the vascular wall In a  propensity score matched cohort study in [place], there was a 66% increased rate of aortic aneurysm or dissection associated with oral fluoroquinolone use, compared wit    Health Maintenance  Topic Date Due   COVID-19 Vaccine (1) Never done   Hepatitis C Screening  Never done   TETANUS/TDAP  Never done   Zoster  Vaccines- Shingrix (1 of 2) Never done   Pneumonia Vaccine 48+ Years old (1 - PCV) Never done   COLONOSCOPY (Pts 45-69yr Insurance coverage will need to be confirmed)  09/26/2019   INFLUENZA VACCINE  08/10/2021   HPV VACCINES  Aged Out    Objective: BP 109/74   Pulse 88   Temp 98.2 F (36.8 C) (Oral)   Wt 296 lb (134.3 kg)   BMI 36.03 kg/m   Physical Exam Constitutional:      Appearance: Normal appearance. Obese  HENT:     Head: Normocephalic and atraumatic.      Mouth: Mucous membranes are moist.  Eyes:    Conjunctiva/sclera: Conjunctivae normal.     Pupils:  Cardiovascular:     Rate and Rhythm: Normal rate and regular rhythm.     Heart sounds:   Pulmonary:     Effort: Pulmonary effort is normal.     Breath sounds: Normal breath sounds.   Abdominal:     General: Non distended     Palpations: soft.   Musculoskeletal:        General: Normal range of motion.   Rt knee    RT knee/Rt leg swollen overall with overlying skin discoloration at the lower leg but no erythema/warmth and tenderness.   Skin:    General: Skin is warm and dry.     Comments:  Neurological:     General: grossly non focal     Mental Status: awake, alert and oriented to person, place, and time.   Psychiatric:        Mood and Affect: Mood normal.   Lab Results Lab Results  Component Value Date   WBC 11.4 (H) 07/24/2021   HGB 10.2 (L) 07/24/2021   HCT 32.2 (L) 07/24/2021   MCV 92.0 07/24/2021   PLT 165 07/24/2021    Lab Results  Component Value Date   CREATININE 0.76 07/23/2021  BUN 15 07/23/2021   NA 137 07/23/2021   K 4.1 07/23/2021   CL 107 07/23/2021   CO2 24 07/23/2021    Lab Results  Component Value Date   ALT 14 04/26/2021   AST 14 (L) 04/26/2021   ALKPHOS 56 04/26/2021   BILITOT 0.6 04/26/2021    Lab Results  Component Value Date   CHOL 168 09/04/2020   HDL 55 09/04/2020   LDLCALC 99 09/04/2020   TRIG 74 09/04/2020   CHOLHDL 3.1 09/04/2020   No results  found for: "LABRPR", "RPRTITER" No results found for: "HIV1RNAQUANT", "HIV1RNAVL", "CD4TABS"   Microbiology Results for orders placed or performed during the hospital encounter of 07/22/21  Aerobic/Anaerobic Culture w Gram Stain (surgical/deep wound)     Status: None   Collection Time: 07/22/21 12:28 PM   Specimen: Synovial, Right Knee; Body Fluid  Result Value Ref Range Status   Specimen Description   Final    SYNOVIAL RT KNEE Performed at [facility] [address], [place], [place] [address]    Special Requests   Final    NONE Performed at [facility], [place] [address] [place], [place] [address]    Gram Stain   Final    ABUNDANT WBC PRESENT, PREDOMINANTLY PMN NO ORGANISMS SEEN    Culture   Final    RARE STAPHYLOCOCCUS LUGDUNENSIS NO ANAEROBES ISOLATED Performed at [facility], [place] [address], [place], [place] [address]    Report Status 07/27/2021 FINAL  Final   Organism ID, Bacteria STAPHYLOCOCCUS LUGDUNENSIS  Final      Susceptibility   Staphylococcus lugdunensis - MIC*    CIPROFLOXACIN <=0.5 SENSITIVE Sensitive     ERYTHROMYCIN >=8 RESISTANT Resistant     GENTAMICIN <=0.5 SENSITIVE Sensitive     OXACILLIN 0.5 SENSITIVE Sensitive     TETRACYCLINE <=1 SENSITIVE Sensitive     VANCOMYCIN <=0.5 SENSITIVE Sensitive     TRIMETH/SULFA <=10 SENSITIVE Sensitive     CLINDAMYCIN >=8 RESISTANT Resistant     RIFAMPIN <=0.5 SENSITIVE Sensitive     Inducible Clindamycin NEGATIVE Sensitive     * RARE STAPHYLOCOCCUS LUGDUNENSIS    [place] EKG SITE RITE  Result Date: 07/23/2021 If Site Rite image not attached, placement could not be confirmed due to current cardiac rhythm.   Problem List Items Addressed This Visit       Musculoskeletal and Integument   Infection of prosthetic right knee joint ([place]) - Primary     Other   PICC (peripherally inserted central catheter) in place   Medication management   Assessment/Plan 66 year old male  with PMH of right total hip arthroplasty March 2016 left hip total arthroplasty June 2011, lumbar laminectomy/decompression, right total knee arthroplasty February 5784 complicated by several months of worsening pain/swelling laterally status post I&D and evacuation of hematoma and abscess in April 27, 2021  with negative cx ( 4/10 status post IR aspiration of the hematoma, culture staph lugdunensis) status post 2 weeks of cefadroxil here for fu for w   # PJI rt knee  07/23/21 s/p excision of total knee arthroplasty with antibiotic spacers on 7/13.  OR cultures sent.  Per our notes, fairly large purulent effusion with excisional debridement, patellar button was loose with significant nonviable fibrinous tissue underneath.  Plan for 2 stage revision.   Plan  Complete 6 weeks of IV cefazolin. EOT 09/02/21 Fu in 3-4 weeks, before end date of abtx Fu with Dr Alvan Dame as instructed   # RT  knee and leg swelling Does not appear to be recurrence of infection/cellulitis of septic knee Possibly post operative limb swelling Will get an [place] of rt leg to r/o DVT  Keel leg elevated Monitor for worsening swelling or signs of infection   # Medication Monitoring 07/26/21 hb 10.7, cr 0.81, ESR 34, CRP 5  # PICC No concerns  PICC to be removed after last dose on 09/02/21  I have personally spent 41  minutes involved in face-to-face and non-face-to-face activities for this patient on the day of the visit. Professional time spent includes the following activities: Preparing to see the patient (review of tests), Obtaining and/or reviewing separately obtained history (admission/discharge record), Performing a medically appropriate examination and/or evaluation , Ordering medications/tests/procedures, referring and communicating with other health care professionals, Documenting clinical information in the EMR, Independently interpreting results (not separately reported), Communicating results to the patient/family/caregiver,  Counseling and educating the patient/family/caregiver and Care coordination (not separately reported).   Wilber Oliphant, [place] for Infectious Disease [facility] 08/06/2021, 7:55 AM

## 2021-08-09 ENCOUNTER — Ambulatory Visit
Admission: RE | Admit: 2021-08-09 | Discharge: 2021-08-09 | Disposition: A | Discharge status: Home / Self Care | Payer: BC Managed Care – PPO | Source: Ambulatory Visit | Attending: Infectious Diseases | Admitting: Infectious Diseases

## 2021-08-09 ENCOUNTER — Telehealth: Payer: Self-pay

## 2021-08-09 NOTE — Telephone Encounter (Signed)
-----   Message from Rosiland Oz, MD sent at 08/09/2021  3:50 PM EDT ----- Please let him know that Rt upper extremity ultrasound is negative for DVT.

## 2021-08-09 NOTE — Telephone Encounter (Signed)
Called and spoke with patient and communicated corrected message verbatim from Dr. [place] Bali, that "Right lower extremity ultrasound was negative for DVT". All questions answered. Patient confirmed upcoming appointment on 09/01/21.  Binnie Kand, RN

## 2021-09-01 ENCOUNTER — Ambulatory Visit (INDEPENDENT_AMBULATORY_CARE_PROVIDER_SITE_OTHER): Payer: BC Managed Care – PPO | Admitting: Infectious Diseases

## 2021-09-01 ENCOUNTER — Other Ambulatory Visit: Payer: Self-pay

## 2021-09-01 ENCOUNTER — Telehealth: Payer: Self-pay

## 2021-09-01 ENCOUNTER — Encounter: Payer: Self-pay | Admitting: Infectious Diseases

## 2021-09-01 VITALS — BP 126/72 | HR 66 | Temp 98.1°F | Ht 76.0 in | Wt 315.0 lb

## 2021-09-01 DIAGNOSIS — Z79899 Other long term (current) drug therapy: Secondary | ICD-10-CM

## 2021-09-01 DIAGNOSIS — Z452 Encounter for adjustment and management of vascular access device: Secondary | ICD-10-CM | POA: Diagnosis not present

## 2021-09-01 DIAGNOSIS — T8453XD Infection and inflammatory reaction due to internal right knee prosthesis, subsequent encounter: Secondary | ICD-10-CM

## 2021-09-01 NOTE — Progress Notes (Addendum)
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[employer]) 07/22/2021   Infection of right knee ([place]) 04/26/2021   Infected hematoma    Osteoarthritis of right knee 02/26/2020   Aortic regurgitation 07/26/2018   Spinal stenosis of lumbar region 02/24/2016   HNP (herniated nucleus pulposus), lumbar 02/24/2016   Obese 03/13/2014   S/P right THA, AA 03/11/2014   Preoperative clearance 02/28/2014   Thoracic aortic aneurysm ([place]) 03/14/2013   Essential hypertension 03/14/2013   Hyperlipidemia 03/14/2013    Patient's Medications  New Prescriptions   No medications on file  Previous Medications   ACETAMINOPHEN (TYLENOL) 500 MG TABLET    Take 500-1,000 mg by mouth every 6 (six) hours as needed for moderate pain.   AMLODIPINE (NORVASC) 10 MG TABLET    Take 10 mg by mouth daily.   ATORVASTATIN (LIPITOR) 40 MG TABLET    TAKE 1 TABLET BY MOUTH EVERY DAY   CEFAZOLIN (ANCEF) IVPB    Inject 2 g into the vein every 8 (eight) hours. Indication:  R-knee PJI First Dose: Yes Last Day of Therapy:  09/02/21 Labs - Once weekly:  CBC/D and BMP, Labs - Every other week:  ESR and CRP Method of administration: IV Push Method of administration may be changed at the discretion of home infusion pharmacist based upon assessment of the patient and/or caregiver's ability to self-administer the medication ordered.   DOCUSATE SODIUM (COLACE) 100 MG CAPSULE    Take 1 capsule (100 mg total) by mouth 2 (two) times daily.   FUROSEMIDE (LASIX) 20 MG TABLET    Take 40 mg by mouth in the morning.   LISINOPRIL (PRINIVIL,ZESTRIL) 40 MG TABLET    Take 40 mg by mouth daily.   MECLIZINE (ANTIVERT) 25 MG TABLET    Take 25 mg by mouth 3 (three) times daily as needed for dizziness.   MELOXICAM (MOBIC) 15 MG TABLET    Take 15 mg by mouth daily.   METHOCARBAMOL (ROBAXIN) 500 MG  TABLET    Take 1 tablet (500 mg total) by mouth every 6 (six) hours as needed for muscle spasms.   OXYCODONE (OXY IR/ROXICODONE) 5 MG IMMEDIATE RELEASE TABLET    Take 1 tablet by mouth every 4 hours as needed. Take 2 tablets only if needed  for severe pain.   POLYETHYLENE GLYCOL (MIRALAX / GLYCOLAX) 17 G PACKET    Take 17 g by mouth daily as needed for mild constipation.   PREGABALIN (LYRICA) 75 MG CAPSULE    Take 75 mg by mouth 2 (two) times daily.   TRAZODONE (DESYREL) 50 MG TABLET    Take 50 mg by mouth at bedtime.   TRIAMCINOLONE (KENALOG) 0.1 %    Apply 1 application  topically daily.   VITAMIN B-12 (CYANOCOBALAMIN) 50 MCG TABLET    Take 50 mcg by mouth daily.   VITAMIN C (ASCORBIC ACID) 500 MG TABLET    Take 500 mg by mouth daily.  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 Getting IV cefazolin through picc without missed doses or concerns. Denies fevers, chills. Denies nausea, vomiting and diarrhea. He however complains of persistent swelling at the rt knee joint including the rt leg and associated pain since surgery. He  also feels his bilateral toes in both feet are stiff and hurt at times. He tells me he saw Dr Alvan Dame on Monday 7/24 when he had the swelling and pain in the rt leg and knee and was thought to be expected post operative. Denies any redness/tenderness in the rt knee joint and leg.   09/01/21 Accompanied by family member. Getting IV cefazolin from rt arm PICC. Denies any issues with PICC. Denies any concerns with antibiotics like nausea, vomiting and diarrhea. He has a fu with Dr Alvan Dame next Monday and planned for 2nd stage on 9/21. Rt knee pain is down to 2/10. Denies redness and warmth. He has bilateral leg swelling and tells me he has h/o lymphedema in his leg however stopped using lymphedema pump since his rt knee surgery and asking if he can use it back again. I discussed that he  will need to check with Dr Alvan Dame. No other complains otherwise.   Review of Systems: all systems reviewed with pertinent positives and negatives as listed above  Past Medical History:  Diagnosis Date   Acute meniscal tear of knee left   Aneurysm, ascending aorta ([place]) 4.8cm per ct 2011/ CARDIOLOGSIT--  DR Rollene Fare  LOV  10-07-2011  NOTE W/ CHART AND REQUEST [facility] MYOVIEW AND ECHO TO BE FAXED.   ASYMPTOMATIC   Heart murmur MILD-- ASYMPTOMATIC   Hyperlipidemia    Hypertension    OA (osteoarthritis) of knee left   OSA (obstructive sleep apnea) NON-TOLERANT CPAP   no cpap PT REPORTS NEVER FINISHED TEST    Pre-diabetes    Sleep apnea    Tinnitus of both ears    Past Surgical History:  Procedure Laterality Date   BILATERAL IINGUINAL HERNIA REPAIR  1963   DOPPLER ECHOCARDIOGRAPHY  10/18/2011   EF >55 %   ENDOVENOUS ABLATION SAPHENOUS VEIN W/ LASER Right 12/30/2020   endovenous laser ablation right greater saphenous vein by Gae Gallop MD   EXCISIONAL TOTAL KNEE ARTHROPLASTY WITH ANTIBIOTIC SPACERS Right 07/22/2021   Procedure: EXCISIONAL TOTAL KNEE ARTHROPLASTY WITH ANTIBIOTIC SPACERS;  Surgeon: Paralee Cancel, MD;  Location: [facility];  Service: Orthopedics;  Laterality: Right;   INCISION AND DRAINAGE OF WOUND Right 04/27/2021   Procedure: IRRIGATION AND DEBRIDEMENT OF HEMATOMA AND ABCESS ,ASPIRATE RIGHT KNEE;  Surgeon: Sydnee Cabal, MD;  Location: [facility];  Service: Orthopedics;  Laterality: Right;  REQUEST 3:30PM START TIME   LUMBAR LAMINECTOMY/DECOMPRESSION MICRODISCECTOMY N/A 02/24/2016   Procedure: Bilateral Decompression L4-5, Discectomy L4-5 ;  Surgeon: Susa Day, MD;  Location: [facility];  Service: Orthopedics;  Laterality: N/A;   NM MYOCAR PERF WALL MOTION  10/13/2011   EF 49 %,low risk study   RIGHT KNEE SURGERY X 2      ARTHROSCOPY    TONSILLECTOMY  1965   torn meniscus left knee surgery      TORN MENISCUS X 3 IN KNEES SURGERY      LEFT    TOTAL HIP ARTHROPLASTY   06/18/2009   OA LEFT HIP   TOTAL HIP ARTHROPLASTY Right 03/11/2014   Procedure: RIGHT TOTAL HIP ARTHROPLASTY ANTERIOR APPROACH;  Surgeon: [name], MD;  Location: [facility];  Service: Orthopedics;  Laterality: Right;   TOTAL KNEE ARTHROPLASTY Right 02/26/2020   Procedure: TOTAL KNEE ARTHROPLASTY;  Surgeon: Sydnee Cabal, MD;  Location: [facility];  Service: Orthopedics;  Laterality: Right;  adductor canal   TRANSTHORACIC ECHOCARDIOGRAM  09/11/2012   EF 55 to 60 %,mild aortic valve regurg,mild mitral valve regurg,lf and rt atriums  mildly dilated   VARICOSE VEIN SURGERY Left 1980   veins removed from left leg  1994     Social History   Tobacco Use   Smoking status: Never   Smokeless tobacco: Former    Quit date: 09/11/1988  Vaping Use   Vaping Use: Never used  Substance Use Topics   Alcohol use: Yes    Alcohol/week: 1.0 standard drink of alcohol    Types: 1 Standard drinks or equivalent per week    Comment: RARE   Drug use: No    Family History  Problem Relation Age of Onset   Diabetes Mother    Cancer Mother    Heart failure Other    Diabetes Other    Cancer Other    COPD Other    Colon cancer Neg Hx    Stomach cancer Neg Hx     Allergies  Allergen Reactions   Penicillins Other (See Comments)    Childhood reaction- not recalled Has patient had a PCN reaction causing immediate rash, facial/tongue/throat swelling, SOB or lightheadedness with hypotension:unsure Has patient had a PCN reaction causing severe rash involving mucus membranes or skin necrosis:unsure Has patient had a PCN reaction that required hospitalization:unsure Has patient had a PCN reaction occurring within the last 10 years:No If all of the above answers are "NO", then may proceed with Ceph.use. Tolerated ancef 07/22/21     Quinolones Other (See Comments)    Patient was warned about not using Cipro and similar antibiotics. Recent studies have raised concern that fluoroquinolone antibiotics could be  associated with an increased risk of aortic aneurysm Fluoroquinolones have non-antimicrobial properties that might jeopardise the integrity of the extracellular matrix of the vascular wall In a  propensity score matched cohort study in [place], there was a 66% increased rate of aortic aneurysm or dissection associated with oral fluoroquinolone use, compared wit    Health Maintenance  Topic Date Due   COVID-19 Vaccine (1) Never done   Hepatitis C Screening  Never done   TETANUS/TDAP  Never done   Zoster Vaccines- Shingrix (1 of 2) Never done   Pneumonia Vaccine 84+ Years old (1 - PCV) Never done   COLONOSCOPY (Pts 45-6yr Insurance coverage will need to be confirmed)  09/26/2019   INFLUENZA VACCINE  08/10/2021   HPV VACCINES  Aged Out    Objective: BP 126/72   Pulse 66   Temp 98.1 F (36.7 C) (Temporal)   Ht '6\' 4"'  (1.93 m)   Wt (!) 315 lb (142.9 kg)   BMI 38.34 kg/m    Physical Exam Constitutional:      Appearance: Normal appearance. Obese  HENT:     Head: Normocephalic and atraumatic.      Mouth: Mucous membranes are moist.  Eyes:    Conjunctiva/sclera: Conjunctivae normal.     Pupils:  Cardiovascular:     Rate and Rhythm: Normal rate and regular rhythm.     Heart sounds:   Pulmonary:     Effort: Pulmonary effort is normal.     Breath sounds: Normal breath sounds.   Abdominal:     General: Non distended     Palpations: soft.   Musculoskeletal:        General: Normal range of motion.   Rt knee incision has healed, mild swelling but no signs of septic joint. Bilateral leg swelling with no signs of cellulitis ( prior h/o lymphedema)  Skin:    General: Skin is warm and dry.  Comments: Rt arm PICC +  Neurological:     General: grossly non focal     Mental Status: awake, alert and oriented to person, place, and time.   Psychiatric:        Mood and Affect: Mood normal.   Lab Results Lab Results  Component Value Date   WBC 11.4 (H) 07/24/2021   HGB  10.2 (L) 07/24/2021   HCT 32.2 (L) 07/24/2021   MCV 92.0 07/24/2021   PLT 165 07/24/2021    Lab Results  Component Value Date   CREATININE 0.76 07/23/2021   BUN 15 07/23/2021   NA 137 07/23/2021   K 4.1 07/23/2021   CL 107 07/23/2021   CO2 24 07/23/2021    Lab Results  Component Value Date   ALT 14 04/26/2021   AST 14 (L) 04/26/2021   ALKPHOS 56 04/26/2021   BILITOT 0.6 04/26/2021    Lab Results  Component Value Date   CHOL 168 09/04/2020   HDL 55 09/04/2020   LDLCALC 99 09/04/2020   TRIG 74 09/04/2020   CHOLHDL 3.1 09/04/2020   No results found for: "LABRPR", "RPRTITER" No results found for: "HIV1RNAQUANT", "HIV1RNAVL", "CD4TABS"   Microbiology Results for orders placed or performed during the hospital encounter of 07/22/21  Aerobic/Anaerobic Culture w Gram Stain (surgical/deep wound)     Status: None   Collection Time: 07/22/21 12:28 PM   Specimen: Synovial, Right Knee; Body Fluid  Result Value Ref Range Status   Specimen Description   Final    SYNOVIAL RT KNEE Performed at [facility] [address], [place], [place] [address]    Special Requests   Final    NONE Performed at [facility], [place] [address] [place], [place] [address]    Gram Stain   Final    ABUNDANT WBC PRESENT, PREDOMINANTLY PMN NO ORGANISMS SEEN    Culture   Final    RARE STAPHYLOCOCCUS LUGDUNENSIS NO ANAEROBES ISOLATED Performed at [facility], [employer] [address], [place], [place] [address]    Report Status 07/27/2021 FINAL  Final   Organism ID, Bacteria STAPHYLOCOCCUS LUGDUNENSIS  Final      Susceptibility   Staphylococcus lugdunensis - MIC*    CIPROFLOXACIN <=0.5 SENSITIVE Sensitive     ERYTHROMYCIN >=8 RESISTANT Resistant     GENTAMICIN <=0.5 SENSITIVE Sensitive     OXACILLIN 0.5 SENSITIVE Sensitive     TETRACYCLINE <=1 SENSITIVE Sensitive     VANCOMYCIN <=0.5 SENSITIVE Sensitive     TRIMETH/SULFA <=10 SENSITIVE Sensitive      CLINDAMYCIN >=8 RESISTANT Resistant     RIFAMPIN <=0.5 SENSITIVE Sensitive     Inducible Clindamycin NEGATIVE Sensitive     * RARE STAPHYLOCOCCUS LUGDUNENSIS    US Venous Img Lower Unilateral Right (DVT)  Result Date: 08/09/2021 CLINICAL DATA:  Right lower extremity pain and swelling EXAM: RIGHT LOWER EXTREMITY VENOUS DOPPLER ULTRASOUND TECHNIQUE: Gray-scale sonography with compression, as well as color and duplex ultrasound, were performed to evaluate the deep venous system(s) from the level of the common femoral vein through the popliteal and proximal calf veins. COMPARISON:  None available FINDINGS: VENOUS Normal compressibility of the common femoral, superficial femoral, and popliteal veins, as well as the visualized calf veins. Visualized portions of profunda femoral vein and great saphenous vein unremarkable. No filling defects to suggest DVT on grayscale or color Doppler imaging. Doppler waveforms show normal direction of venous flow, normal respiratory plasticity and response to augmentation. Limited views of the  contralateral common femoral vein are unremarkable. OTHER None. Limitations: Right peroneal vein not well visualized. IMPRESSION: No right lower extremity DVT. Electronically Signed   By: Miachel Roux M.D.   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  OR cultures sent.  Per our notes, fairly large purulent effusion with  excisional debridement, patellar button was loose with significant nonviable fibrinous tissue underneath.  Plan for 2 stage revision.   Plan  Complete 6 weeks of IV cefazolin. EOT 09/02/21. Would need at least 2 weeks off antibiotics/aspirate knee joint for cell count and cultures before re-implantation  ESR and CRP today Fu with Ortho as planned  Fu in 3-4 weeks   # Bilateral leg swelling, h/o lymphedema  - no signs of recurrent infection in rt knee and cellulitis in bilateral legs - [place] of rt leg negative for DVT 08/09/21 - Advised to discuss with Ortho no c/I to use lymphedema pump   # Medication Monitoring 07/26/21 hb 10.7, cr 0.81, ESR 34, CRP 5 08/23/21 Cr 0.99, hb 12.1, WBC 6.5  # PICC No concerns  PICC to be removed after last dose on 09/02/21  I have personally spent 41  minutes involved in face-to-face and non-face-to-face activities for this patient on the day of the visit including counseling of the patient and coordination of care.   Wilber Oliphant, [facility] 09/01/2021, 2:09 PM

## 2021-09-01 NOTE — Telephone Encounter (Signed)
Per Md pull picc after last dose of IV antibiotics on 8/24. Called [facility] and spoke to the pharmacist Jeani Hawking who read back order and verbalized understanding.

## 2021-09-02 LAB — C-REACTIVE PROTEIN: CRP: 2.1 mg/L (ref <8.0)

## 2021-09-02 LAB — SEDIMENTATION RATE: Sed Rate: 9 mm/h (ref 0–20)

## 2021-09-02 NOTE — Telephone Encounter (Signed)
Thanks Aniceto Boss

## 2021-09-03 ENCOUNTER — Telehealth: Payer: Self-pay

## 2021-09-03 NOTE — Telephone Encounter (Signed)
-----  Message from Rosiland Oz, MD sent at 09/03/2021  2:22 PM EDT ----- Please let him know ESR and CRP are normal which means a good sign.

## 2021-09-03 NOTE — Telephone Encounter (Signed)
Spoke with patient and relayed message verbatim from Dr. West Bali, "ESR and CRP are normal which is a good sign". Patient verbalized understanding and confirmed follow up appointment on 9/19. No additional questions.  Binnie Kand, RN

## 2021-09-07 ENCOUNTER — Encounter: Payer: Self-pay | Admitting: Infectious Diseases

## 2021-09-07 NOTE — Progress Notes (Signed)
8/21 ESR 29, CRP 4   

## 2021-09-22 ENCOUNTER — Other Ambulatory Visit (HOSPITAL_COMMUNITY): Payer: Self-pay

## 2021-09-22 NOTE — Patient Instructions (Signed)
DUE TO COVID-19 ONLY TWO VISITORS  (aged 68 and older)  ARE ALLOWED TO COME WITH YOU AND STAY IN THE WAITING ROOM ONLY DURING PRE OP AND PROCEDURE.   **NO VISITORS ARE ALLOWED IN THE SHORT STAY AREA OR RECOVERY ROOM!!**  IF YOU WILL BE ADMITTED INTO THE HOSPITAL YOU ARE ALLOWED ONLY FOUR SUPPORT PEOPLE DURING VISITATION HOURS ONLY (7 AM -8PM)   The support person(s) must pass our screening, gel in and out, and wear a mask at all times, including in the patient's room. Patients must also wear a mask when staff or their support person are in the room. Visitors GUEST BADGE MUST BE WORN VISIBLY  One adult visitor may remain with you overnight and MUST be in the room by 8 P.M.     Your procedure is scheduled on: 09/30/21   Report to [facility] Main Entrance    Report to admitting at 7:35 AM   Call this number if you have problems the morning of surgery [phone]   Do not eat food :After Midnight.   After Midnight you may have the following liquids until _7:00_____ AM/  DAY OF SURGERY  Water Black Coffee (sugar ok, NO MILK/CREAM OR CREAMERS)  Tea (sugar ok, NO MILK/CREAM OR CREAMERS) regular and decaf                             Plain Jell-O (NO RED)                                           Fruit ices (not with fruit pulp, NO RED)                                     Popsicles (NO RED)                                                                  Juice: apple, WHITE grape, WHITE cranberry Sports drinks like Gatorade (NO RED)                  The day of surgery:  Drink ONE G2 at 6:45 AM the morning of surgery. Drink in one sitting. Do not sip.  This drink was given to you during your hospital  pre-op appointment visit. Nothing else to drink after completing the   G2. At 7:00 AM          If you have questions, please contact your surgeon's office.   FOLLOW BOWEL PREP AND ANY ADDITIONAL PRE OP INSTRUCTIONS YOU RECEIVED FROM YOUR SURGEON'S OFFICE!!!     Oral Hygiene  is also important to reduce your risk of infection.                                    Remember - BRUSH YOUR TEETH THE MORNING OF SURGERY WITH YOUR REGULAR TOOTHPASTE   Do NOT smoke after Midnight   Take these medicines the morning of surgery  with A SIP OF WATER: Lyrica, Atorvastatin, Amlodipine  DO NOT TAKE ANY ORAL DIABETIC MEDICATIONS DAY OF YOUR SURGERY  Bring CPAP mask and tubing day of surgery.                              You may not have any metal on your body including jewelry, and body piercing             Do not wear  lotions, powders, perfumes/cologne, or deodorant                Men may shave face and neck.   Do not bring valuables to the hospital. [place].   Contacts, dentures or bridgework may not be worn into surgery.   Bring small overnight bag day of surgery.   DO NOT [place]. PHARMACY WILL DISPENSE MEDICATIONS LISTED ON YOUR MEDICATION LIST TO YOU DURING YOUR ADMISSION [place]!    Patients discharged on the day of surgery will not be allowed to drive home.  Someone NEEDS to stay with you for the first 24 hours after anesthesia.   Special Instructions: Bring a copy of your healthcare power of attorney and living will documents  the day of surgery if you haven't scanned them before.              Please read over the following fact sheets you were given: IF YOU HAVE QUESTIONS ABOUT YOUR PRE-OP INSTRUCTIONS PLEASE CALL [phone]     [facility] - Preparing for Surgery Before surgery, you can play an important role.  Because skin is not sterile, your skin needs to be as free of germs as possible.  You can reduce the number of germs on your skin by washing with CHG (chlorahexidine gluconate) soap before surgery.  CHG is an antiseptic cleaner which kills germs and bonds with the skin to continue killing germs even after washing. Please DO NOT use if you have an allergy to  CHG or antibacterial soaps.  If your skin becomes reddened/irritated stop using the CHG and inform your nurse when you arrive at Short Stay.You may shave your face/neck. Please follow these instructions carefully:  1.  Shower with CHG Soap the night before surgery and the  morning of Surgery.  2.  If you choose to wash your hair, wash your hair first as usual with your  normal  shampoo.  3.  After you shampoo, rinse your hair and body thoroughly to remove the  shampoo.                            4.  Use CHG as you would any other liquid soap.  You can apply chg directly  to the skin and wash                       Gently with a scrungie or clean washcloth.  5.  Apply the CHG Soap to your body ONLY FROM THE NECK DOWN.   Do not use on face/ open                           Wound or open sores. Avoid contact with eyes, ears mouth  and genitals (private parts).                       Wash face,  Genitals (private parts) with your normal soap.             6.  Wash thoroughly, paying special attention to the area where your surgery  will be performed.  7.  Thoroughly rinse your body with warm water from the neck down.  8.  DO NOT shower/wash with your normal soap after using and rinsing off  the CHG Soap.                9.  Pat yourself dry with a clean towel.            10.  Wear clean pajamas.            11.  Place clean sheets on your bed the night of your first shower and do not  sleep with pets. Day of Surgery : Do not apply any lotions/deodorants the morning of surgery.  Please wear clean clothes to the hospital/surgery center.  FAILURE TO FOLLOW THESE INSTRUCTIONS MAY RESULT IN THE CANCELLATION OF YOUR SURGERY    ________________________________________________________________________   Incentive Spirometer  An incentive spirometer is a tool that can help keep your lungs clear and active. This tool measures how well you are filling your lungs with each breath. Taking long deep breaths may help  reverse or decrease the chance of developing breathing (pulmonary) problems (especially infection) following: A long period of time when you are unable to move or be active. BEFORE THE PROCEDURE  If the spirometer includes an indicator to show your best effort, your nurse or respiratory therapist will set it to a desired goal. If possible, sit up straight or lean slightly forward. Try not to slouch. Hold the incentive spirometer in an upright position. INSTRUCTIONS FOR USE  Sit on the edge of your bed if possible, or sit up as far as you can in bed or on a chair. Hold the incentive spirometer in an upright position. Breathe out normally. Place the mouthpiece in your mouth and seal your lips tightly around it. Breathe in slowly and as deeply as possible, raising the piston or the ball toward the top of the column. Hold your breath for 3-5 seconds or for as long as possible. Allow the piston or ball to fall to the bottom of the column. Remove the mouthpiece from your mouth and breathe out normally. Rest for a few seconds and repeat Steps 1 through 7 at least 10 times every 1-2 hours when you are awake. Take your time and take a few normal breaths between deep breaths. The spirometer may include an indicator to show your best effort. Use the indicator as a goal to work toward during each repetition. After each set of 10 deep breaths, practice coughing to be sure your lungs are clear. If you have an incision (the cut made at the time of surgery), support your incision when coughing by placing a pillow or rolled up towels firmly against it. Once you are able to get out of bed, walk around indoors and cough well. You may stop using the incentive spirometer when instructed by your caregiver.  RISKS AND COMPLICATIONS Take your time so you do not get dizzy or light-headed. If you are in pain, you may need to take or ask for pain medication before doing incentive spirometry. It is harder to take a  deep  breath if you are having pain. AFTER USE Rest and breathe slowly and easily. It can be helpful to keep track of a log of your progress. Your caregiver can provide you with a simple table to help with this. If you are using the spirometer at home, follow these instructions: [place] IF:  You are having difficultly using the spirometer. You have trouble using the spirometer as often as instructed. Your pain medication is not giving enough relief while using the spirometer. You develop fever of 100.5 F (38.1 C) or higher. SEEK IMMEDIATE MEDICAL CARE IF:  You cough up bloody sputum that had not been present before. You develop fever of 102 F (38.9 C) or greater. You develop worsening pain at or near the incision site. MAKE SURE YOU:  Understand these instructions. Will watch your condition. Will get help right away if you are not doing well or get worse. Document Released: 05/09/2006 Document Revised: 03/21/2011 Document Reviewed: 07/10/2006 [facility] Patient Information 2014 [place], [place].   ________________________________________________________________________

## 2021-09-24 ENCOUNTER — Other Ambulatory Visit: Payer: Self-pay

## 2021-09-24 ENCOUNTER — Encounter (HOSPITAL_COMMUNITY)
Admission: RE | Admit: 2021-09-24 | Discharge: 2021-09-24 | Disposition: A | Discharge status: Home / Self Care | Payer: BC Managed Care – PPO | Source: Ambulatory Visit | Attending: Orthopedic Surgery | Admitting: Orthopedic Surgery

## 2021-09-24 ENCOUNTER — Encounter (HOSPITAL_COMMUNITY): Payer: Self-pay

## 2021-09-24 DIAGNOSIS — I10 Essential (primary) hypertension: Secondary | ICD-10-CM

## 2021-09-24 DIAGNOSIS — T8453XD Infection and inflammatory reaction due to internal right knee prosthesis, subsequent encounter: Secondary | ICD-10-CM | POA: Diagnosis not present

## 2021-09-24 DIAGNOSIS — Z01818 Encounter for other preprocedural examination: Secondary | ICD-10-CM

## 2021-09-24 DIAGNOSIS — Z01812 Encounter for preprocedural laboratory examination: Secondary | ICD-10-CM | POA: Diagnosis present

## 2021-09-24 LAB — BASIC METABOLIC PANEL
Anion gap: 9 (ref 5–15)
BUN: 21 mg/dL (ref 8–23)
CO2: 26 mmol/L (ref 22–32)
Calcium: 9.2 mg/dL (ref 8.9–10.3)
Chloride: 103 mmol/L (ref 98–111)
Creatinine, Ser: 0.8 mg/dL (ref 0.61–1.24)
GFR, Estimated: >60 mL/min (ref >60)
Glucose, Bld: 106 mg/dL — ABNORMAL HIGH (ref 70–99)
Potassium: 3.6 mmol/L (ref 3.5–5.1)
Sodium: 138 mmol/L (ref 135–145)

## 2021-09-24 LAB — SURGICAL PCR SCREEN
MRSA, PCR: NEGATIVE
Staphylococcus aureus: NEGATIVE

## 2021-09-24 LAB — CBC
HCT: 42.8 % (ref 39.0–52.0)
Hemoglobin: 14.2 g/dL (ref 13.0–17.0)
MCH: 31.1 pg (ref 26.0–34.0)
MCHC: 33.2 g/dL (ref 30.0–36.0)
MCV: 93.7 fL (ref 80.0–100.0)
Platelets: 182 10*3/uL (ref 150–400)
RBC: 4.57 MIL/uL (ref 4.22–5.81)
RDW: 13.9 % (ref 11.5–15.5)
WBC: 6.2 10*3/uL (ref 4.0–10.5)
nRBC: 0 % (ref 0.0–0.2)

## 2021-09-24 LAB — SEDIMENTATION RATE: Sed Rate: 10 mm/hr (ref 0–16)

## 2021-09-24 LAB — GLUCOSE, CAPILLARY: Glucose-Capillary: 113 mg/dL — ABNORMAL HIGH (ref 70–99)

## 2021-09-24 NOTE — Progress Notes (Addendum)
Anesthesia note:  Bowel prep reminder:NA  PCP - Cyndi Bender PA Cardiologist -Dr. Adora Fridge Other-   Chest x-ray - no EKG - 07/14/21-epic Stress Test - no ECHO - 01/06/21-epic Cardiac Cath - no  Pacemaker/ICD device last checked:NA  Sleep Study - yes CPAP - no, Not able to manage using the mask while sleeping in the lounge chair. Insurance wont' pay for it if you don't use it a certain amount of time. So he sent it back  Pt is pre diabetic-CBG at [place] was 113 Fasting Blood Sugar -  Checks Blood Sugar _____  Blood Thinner:ASA  Blood Thinner Instructions:stop 5 days prior to DOS/ Dr. Alvan Dame Aspirin Instructions: Last Dose:09/24/21  Anesthesia review: yes  Patient denies shortness of breath, fever, cough and chest pain at PAT appointment Pt has AAA and a murmur. He reports no SOB . PICC line has been removed  Patient verbalized understanding of instructions that were given to them at the PAT appointment. Patient was also instructed that they will need to review over the PAT instructions again at home before surgery. yes

## 2021-09-28 ENCOUNTER — Ambulatory Visit (INDEPENDENT_AMBULATORY_CARE_PROVIDER_SITE_OTHER): Payer: BC Managed Care – PPO | Admitting: Infectious Diseases

## 2021-09-28 ENCOUNTER — Encounter: Payer: Self-pay | Admitting: Infectious Diseases

## 2021-09-28 ENCOUNTER — Other Ambulatory Visit: Payer: Self-pay

## 2021-09-28 VITALS — BP 130/75 | HR 63 | Resp 16 | Ht 76.0 in | Wt 318.0 lb

## 2021-09-28 DIAGNOSIS — Z452 Encounter for adjustment and management of vascular access device: Secondary | ICD-10-CM | POA: Diagnosis not present

## 2021-09-28 DIAGNOSIS — T8453XD Infection and inflammatory reaction due to internal right knee prosthesis, subsequent encounter: Secondary | ICD-10-CM

## 2021-09-28 DIAGNOSIS — Z79899 Other long term (current) drug therapy: Secondary | ICD-10-CM | POA: Diagnosis not present

## 2021-09-28 NOTE — Progress Notes (Addendum)
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/22/2021   Infection of right knee ([place]) 04/26/2021   Infected hematoma    Osteoarthritis of right knee 02/26/2020   Aortic regurgitation 07/26/2018   Spinal stenosis of lumbar region 02/24/2016   HNP (herniated nucleus pulposus), lumbar 02/24/2016   Obese 03/13/2014   S/P right THA, AA 03/11/2014   Preoperative clearance 02/28/2014   Thoracic aortic aneurysm ([place]) 03/14/2013   Essential hypertension 03/14/2013   Hyperlipidemia 03/14/2013    Patient's Medications  New Prescriptions   No medications on file  Previous Medications   ACETAMINOPHEN (TYLENOL) 500 MG TABLET    Take 1,000 mg by mouth 2 (two) times daily.   AMLODIPINE (NORVASC) 10 MG TABLET    Take 10 mg by mouth daily.   ASCORBIC ACID (VITAMIN C) 1000 MG TABLET    Take 1,000 mg by mouth daily.   ATORVASTATIN (LIPITOR) 40 MG TABLET    TAKE 1 TABLET BY MOUTH EVERY DAY   CYANOCOBALAMIN (VITAMIN B12) 1000 MCG TABLET    Take 1,000 mcg by mouth daily.   DICLOFENAC (VOLTAREN) 75 MG EC TABLET    Take 75 mg by mouth 2 (two) times daily.   DOCUSATE SODIUM (COLACE) 100 MG CAPSULE    Take 1 capsule (100 mg total) by mouth 2 (two) times daily.   FUROSEMIDE (LASIX) 20 MG TABLET    Take 40 mg by mouth in the morning.   LISINOPRIL (PRINIVIL,ZESTRIL) 40 MG TABLET    Take 40 mg by mouth daily.   MECLIZINE (ANTIVERT) 25 MG TABLET    Take 25 mg by mouth daily.   POLYETHYLENE GLYCOL (MIRALAX / GLYCOLAX) 17 G PACKET    Take 17 g by mouth daily as needed for mild constipation.   PREGABALIN (LYRICA) 75 MG CAPSULE    Take 75 mg by mouth 2 (two) times daily.   TAMSULOSIN (FLOMAX) 0.4 MG CAPS CAPSULE    Take 0.4 mg by mouth daily.   TRAMADOL (ULTRAM) 50 MG TABLET    Take 100 mg by mouth 3 (three) times daily.   TRAZODONE (DESYREL) 50 MG  TABLET    Take 50 mg by mouth at bedtime as needed for sleep.   TRIAMCINOLONE (KENALOG) 0.1 %    Apply 1 application  topically daily.  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 Getting IV cefazolin through picc without missed doses or concerns. Denies fevers, chills. Denies nausea, vomiting and diarrhea. He however complains of persistent swelling at the rt knee joint including the rt leg and associated pain since surgery. He also feels his bilateral toes in both feet are stiff and hurt at times. He tells me he saw Dr Alvan Dame on Monday 7/24 when he had the swelling and pain in the rt leg and knee and was thought to be expected post operative. Denies any redness/tenderness in the rt knee joint and leg.   09/01/21 Accompanied by family member. Getting IV cefazolin from rt arm PICC. Denies any issues with PICC. Denies any concerns with antibiotics like nausea, vomiting and diarrhea. He has a fu with Dr Alvan Dame next Monday and planned for 2nd stage on 9/21. Rt knee pain is down to 2/10. Denies  redness and warmth. He has bilateral leg swelling and tells me he has h/o lymphedema in his leg however stopped using lymphedema pump since his rt knee surgery and asking if he can use it back again. I discussed that he will need to check with Dr Alvan Dame. No other complains otherwise.   09/28/21 Accompanied by family member. Completed IV abtx on 09/02/21. PICC line has been removed, Rt knee pain is 2-3/10. He is able to ambulate short distances with the help of walker. Saw Dr Alvan Dame Aug 29 and planned for reimplantation of knee on 9/21. He had CRP done on 9/15 which is pending.  He has also started using his lymphedema pump as suggested by Ortho.   Review of Systems: all systems reviewed with pertinent positives and negatives as listed above  Past Medical History:  Diagnosis Date   Acute meniscal tear of knee left    Aneurysm, ascending aorta ([place]) 4.8cm per ct 2011/ CARDIOLOGSIT--  DR Rollene Fare  LOV  10-07-2011  NOTE W/ CHART AND REQUEST [facility] MYOVIEW AND ECHO TO BE FAXED.   ASYMPTOMATIC   Heart murmur MILD-- ASYMPTOMATIC   Hyperlipidemia    Hypertension    OA (osteoarthritis) of knee left   OSA (obstructive sleep apnea) NON-TOLERANT CPAP   no cpap PT REPORTS NEVER FINISHED TEST    Pre-diabetes    Sleep apnea    Tinnitus of both ears    Past Surgical History:  Procedure Laterality Date   BILATERAL IINGUINAL HERNIA REPAIR  1963   DOPPLER ECHOCARDIOGRAPHY  10/18/2011   EF >55 %   ENDOVENOUS ABLATION SAPHENOUS VEIN W/ LASER Right 12/30/2020   endovenous laser ablation right greater saphenous vein by Gae Gallop MD   EXCISIONAL TOTAL KNEE ARTHROPLASTY WITH ANTIBIOTIC SPACERS Right 07/22/2021   Procedure: EXCISIONAL TOTAL KNEE ARTHROPLASTY WITH ANTIBIOTIC SPACERS;  Surgeon: Paralee Cancel, MD;  Location: [facility];  Service: Orthopedics;  Laterality: Right;   INCISION AND DRAINAGE OF WOUND Right 04/27/2021   Procedure: IRRIGATION AND DEBRIDEMENT OF HEMATOMA AND ABCESS ,ASPIRATE RIGHT KNEE;  Surgeon: Sydnee Cabal, MD;  Location: [facility];  Service: Orthopedics;  Laterality: Right;  REQUEST 3:30PM START TIME   LUMBAR LAMINECTOMY/DECOMPRESSION MICRODISCECTOMY N/A 02/24/2016   Procedure: Bilateral Decompression L4-5, Discectomy L4-5 ;  Surgeon: Susa Day, MD;  Location: [facility];  Service: Orthopedics;  Laterality: N/A;   NM MYOCAR PERF WALL MOTION  10/13/2011   EF 49 %,low risk study   SHOULDER ARTHROSCOPY Left 2022   TONSILLECTOMY  1965   TORN MENISCUS X 3 IN KNEES SURGERY      LEFT    TOTAL HIP ARTHROPLASTY Left 06/18/2009   OA LEFT HIP   TOTAL HIP ARTHROPLASTY Right 03/11/2014   Procedure: RIGHT TOTAL HIP ARTHROPLASTY ANTERIOR APPROACH;  Surgeon: [name], MD;  Location: [facility];  Service: Orthopedics;  Laterality: Right;   TOTAL KNEE ARTHROPLASTY Right 02/26/2020   Procedure: TOTAL KNEE  ARTHROPLASTY;  Surgeon: Sydnee Cabal, MD;  Location: [facility];  Service: Orthopedics;  Laterality: Right;  adductor canal   TRANSTHORACIC ECHOCARDIOGRAM  09/11/2012   EF 55 to 60 %,mild aortic valve regurg,mild mitral valve regurg,lf and rt atriums mildly dilated   VARICOSE VEIN SURGERY Left 1980   and 1994     Social History   Tobacco Use   Smoking status: Never   Smokeless tobacco: Former    Quit date: 09/11/1988  Vaping Use   Vaping Use: Never used  Substance Use Topics   Alcohol use: Yes  Alcohol/week: 1.0 standard drink of alcohol    Types: 1 Standard drinks or equivalent per week    Comment: RARE   Drug use: No    Family History  Problem Relation Age of Onset   Diabetes Mother    Cancer Mother    Heart failure Other    Diabetes Other    Cancer Other    COPD Other    Colon cancer Neg Hx    Stomach cancer Neg Hx     Allergies  Allergen Reactions   Ciprofloxacin Other (See Comments)    Unknown   Penicillins Other (See Comments)    Childhood reaction- not recalled Has patient had a PCN reaction causing immediate rash, facial/tongue/throat swelling, SOB or lightheadedness with hypotension:unsure Has patient had a PCN reaction causing severe rash involving mucus membranes or skin necrosis:unsure Has patient had a PCN reaction that required hospitalization:unsure Has patient had a PCN reaction occurring within the last 10 years:No If all of the above answers are "NO", then may proceed with Ceph.use. Tolerated ancef 07/22/21     Quinolones Other (See Comments)    Patient was warned about not using Cipro and similar antibiotics. Recent studies have raised concern that fluoroquinolone antibiotics could be associated with an increased risk of aortic aneurysm Fluoroquinolones have non-antimicrobial properties that might jeopardise the integrity of the extracellular matrix of the vascular wall In a  propensity score matched cohort study in [place], there was a 66%  increased rate of aortic aneurysm or dissection associated with oral fluoroquinolone use, compared wit    Health Maintenance  Topic Date Due   COVID-19 Vaccine (1) Never done   Hepatitis C Screening  Never done   TETANUS/TDAP  Never done   Zoster Vaccines- Shingrix (2 of 2) 04/28/2018   Pneumonia Vaccine 76+ Years old (1 - PCV) Never done   COLONOSCOPY (Pts 45-75yr Insurance coverage will need to be confirmed)  09/26/2019   INFLUENZA VACCINE  08/10/2021   HPV VACCINES  Aged Out    Objective: BP 130/75   Pulse 63   Resp 16   Ht '6\' 4"'$  (1.93 m)   Wt (!) 318 lb (144.2 kg)   SpO2 98%   BMI 38.71 kg/m    Physical Exam Constitutional:      Appearance: Normal appearance. Obese  HENT:     Head: Normocephalic and atraumatic.      Mouth: Mucous membranes are moist.  Eyes:    Conjunctiva/sclera: Conjunctivae normal.     Pupils:  Cardiovascular:     Rate and Rhythm: Normal rate and regular rhythm.     Heart sounds:   Pulmonary:     Effort: Pulmonary effort is normal.     Breath sounds: Normal breath sounds.   Abdominal:     General: Non distended     Palpations: soft.   Musculoskeletal:        General: Normal range of motion.   Rt knee incision has healed, mild swelling but no signs of septic joint. Bilateral leg swelling with no signs of cellulitis ( prior h/o lymphedema).  Skin:    General: Skin is warm and dry.     Comments: Rt arm PICC ( removed)  Neurological:     General: grossly non focal     Mental Status: awake, alert and oriented to person, place, and time.   Psychiatric:        Mood and Affect: Mood normal.   Lab Results Lab Results  Component Value Date  WBC 6.2 09/24/2021   HGB 14.2 09/24/2021   HCT 42.8 09/24/2021   MCV 93.7 09/24/2021   PLT 182 09/24/2021    Lab Results  Component Value Date   CREATININE 0.80 09/24/2021   BUN 21 09/24/2021   NA 138 09/24/2021   K 3.6 09/24/2021   CL 103 09/24/2021   CO2 26 09/24/2021    Lab Results   Component Value Date   ALT 14 04/26/2021   AST 14 (L) 04/26/2021   ALKPHOS 56 04/26/2021   BILITOT 0.6 04/26/2021    Lab Results  Component Value Date   CHOL 168 09/04/2020   HDL 55 09/04/2020   LDLCALC 99 09/04/2020   TRIG 74 09/04/2020   CHOLHDL 3.1 09/04/2020   No results found for: "LABRPR", "RPRTITER" No results found for: "HIV1RNAQUANT", "HIV1RNAVL", "CD4TABS"   Microbiology Results for orders placed or performed during the hospital encounter of 09/24/21  Surgical pcr screen     Status: None   Collection Time: 09/24/21 11:18 AM   Specimen: Nasal Mucosa; Nasal Swab  Result Value Ref Range Status   MRSA, PCR NEGATIVE NEGATIVE Final   Staphylococcus aureus NEGATIVE NEGATIVE Final    Comment: (NOTE) The Xpert SA Assay (FDA approved for NASAL specimens in patients 6 years of age and older), is one component of a comprehensive surveillance program. It is not intended to diagnose infection nor to guide or monitor treatment. Performed at [facility] [place], [place] [address] [place] [address], [place], [place] [address]     No results found.   Assessment/Plan 66 year old male with PMH of right total hip arthroplasty March 2016 left hip total arthroplasty June 2011, lumbar laminectomy/decompression, right total knee arthroplasty February 1281 complicated by several months of worsening pain/swelling laterally status post I&D and evacuation of hematoma and abscess in April 27, 2021  with negative cx ( 4/10 status post IR aspiration of the hematoma, culture staph lugdunensis) status post 2 weeks of cefadroxil here for fu for w   # PJI rt knee  07/23/21 s/p excision of total knee arthroplasty with antibiotic spacers on 7/13.  OR cultures sent.  Per our notes, fairly large purulent effusion with excisional debridement, patellar button was loose with significant nonviable fibrinous tissue underneath.  Plan for 2 stage revision.   Completed 6 weeks of IV cefazolin 09/02/21. No further  abtx Fu with Ortho for re-implantation  Fu with [place] as needed.   # Bilateral leg swelling, h/o lymphedema  - no signs of recurrent infection in rt knee and cellulitis in bilateral legs - [place] of rt leg negative for DVT 08/09/21 - Using Lymphedema pump    # Medication Monitoring - labs from 9/22 reviewed- unremarkable   # PICC Removed   I have personally spent 41  minutes involved in face-to-face and non-face-to-face activities for this patient on the day of the visit including counseling of the patient and coordination of care.   Wilber Oliphant, [place] [facility] 09/28/2021, 11:17 AM

## 2021-09-30 ENCOUNTER — Inpatient Hospital Stay (HOSPITAL_COMMUNITY): Payer: BC Managed Care – PPO | Admitting: Anesthesiology

## 2021-09-30 ENCOUNTER — Inpatient Hospital Stay (HOSPITAL_COMMUNITY)
Admission: RE | Admit: 2021-09-30 | Discharge: 2021-10-01 | DRG: 468 | Disposition: A | Discharge status: Home / Self Care | Payer: BC Managed Care – PPO | Attending: Orthopedic Surgery | Admitting: Orthopedic Surgery

## 2021-09-30 ENCOUNTER — Encounter (HOSPITAL_COMMUNITY): Payer: Self-pay | Admitting: Orthopedic Surgery

## 2021-09-30 ENCOUNTER — Other Ambulatory Visit: Payer: Self-pay

## 2021-09-30 ENCOUNTER — Inpatient Hospital Stay (HOSPITAL_COMMUNITY): Payer: BC Managed Care – PPO | Admitting: Physician Assistant

## 2021-09-30 ENCOUNTER — Encounter (HOSPITAL_COMMUNITY)
Admission: RE | Disposition: A | Discharge status: Home / Self Care | Payer: Self-pay | Source: Home / Self Care | Attending: Orthopedic Surgery

## 2021-09-30 DIAGNOSIS — Z880 Allergy status to penicillin: Secondary | ICD-10-CM

## 2021-09-30 DIAGNOSIS — E785 Hyperlipidemia, unspecified: Secondary | ICD-10-CM | POA: Diagnosis present

## 2021-09-30 DIAGNOSIS — Z96643 Presence of artificial hip joint, bilateral: Secondary | ICD-10-CM | POA: Diagnosis present

## 2021-09-30 DIAGNOSIS — Z87891 Personal history of nicotine dependence: Secondary | ICD-10-CM | POA: Diagnosis not present

## 2021-09-30 DIAGNOSIS — I10 Essential (primary) hypertension: Secondary | ICD-10-CM | POA: Diagnosis present

## 2021-09-30 DIAGNOSIS — R7303 Prediabetes: Secondary | ICD-10-CM | POA: Diagnosis present

## 2021-09-30 DIAGNOSIS — T8453XD Infection and inflammatory reaction due to internal right knee prosthesis, subsequent encounter: Principal | ICD-10-CM

## 2021-09-30 DIAGNOSIS — Z01818 Encounter for other preprocedural examination: Secondary | ICD-10-CM

## 2021-09-30 DIAGNOSIS — T8453XA Infection and inflammatory reaction due to internal right knee prosthesis, initial encounter: Principal | ICD-10-CM | POA: Diagnosis present

## 2021-09-30 DIAGNOSIS — Z833 Family history of diabetes mellitus: Secondary | ICD-10-CM

## 2021-09-30 DIAGNOSIS — Z981 Arthrodesis status: Secondary | ICD-10-CM | POA: Diagnosis not present

## 2021-09-30 DIAGNOSIS — Z888 Allergy status to other drugs, medicaments and biological substances status: Secondary | ICD-10-CM

## 2021-09-30 DIAGNOSIS — M25561 Pain in right knee: Secondary | ICD-10-CM | POA: Diagnosis present

## 2021-09-30 DIAGNOSIS — Z825 Family history of asthma and other chronic lower respiratory diseases: Secondary | ICD-10-CM

## 2021-09-30 DIAGNOSIS — Z79899 Other long term (current) drug therapy: Secondary | ICD-10-CM

## 2021-09-30 DIAGNOSIS — Z96651 Presence of right artificial knee joint: Secondary | ICD-10-CM

## 2021-09-30 DIAGNOSIS — B998 Other infectious disease: Secondary | ICD-10-CM | POA: Diagnosis present

## 2021-09-30 DIAGNOSIS — Z8249 Family history of ischemic heart disease and other diseases of the circulatory system: Secondary | ICD-10-CM | POA: Diagnosis not present

## 2021-09-30 DIAGNOSIS — Y792 Prosthetic and other implants, materials and accessory orthopedic devices associated with adverse incidents: Secondary | ICD-10-CM | POA: Diagnosis present

## 2021-09-30 DIAGNOSIS — G4733 Obstructive sleep apnea (adult) (pediatric): Secondary | ICD-10-CM | POA: Diagnosis present

## 2021-09-30 DIAGNOSIS — Z6838 Body mass index (BMI) 38.0-38.9, adult: Secondary | ICD-10-CM | POA: Diagnosis not present

## 2021-09-30 DIAGNOSIS — Y929 Unspecified place or not applicable: Secondary | ICD-10-CM | POA: Diagnosis not present

## 2021-09-30 DIAGNOSIS — E669 Obesity, unspecified: Secondary | ICD-10-CM | POA: Diagnosis present

## 2021-09-30 HISTORY — PX: REIMPLANTATION OF TOTAL KNEE: SHX6052

## 2021-09-30 LAB — TYPE AND SCREEN
ABO/RH(D): A POS
Antibody Screen: NEGATIVE

## 2021-09-30 LAB — GLUCOSE, CAPILLARY: Glucose-Capillary: 128 mg/dL — ABNORMAL HIGH (ref 70–99)

## 2021-09-30 LAB — C-REACTIVE PROTEIN: CRP: <0.5 mg/dL (ref <1.0)

## 2021-09-30 SURGERY — REVISION, TOTAL ARTHROPLASTY, KNEE
Anesthesia: Spinal | Site: Knee | Laterality: Right

## 2021-09-30 MED ORDER — CEFAZOLIN IN SODIUM CHLORIDE 3-0.9 GM/100ML-% IV SOLN
3.0000 g | INTRAVENOUS | Status: DC
  Filled 2021-09-30: qty 100

## 2021-09-30 MED ORDER — ROPIVACAINE HCL 7.5 MG/ML IJ SOLN
INTRAMUSCULAR | Status: DC | PRN
  Administered 2021-09-30: 20 mL via PERINEURAL

## 2021-09-30 MED ORDER — ACETAMINOPHEN 325 MG PO TABS: 325.0000 mg | ORAL_TABLET | Freq: Four times a day (QID) | ORAL | Status: DC | PRN

## 2021-09-30 MED ORDER — ASPIRIN 81 MG PO CHEW
81.0000 mg | CHEWABLE_TABLET | Freq: Two times a day (BID) | ORAL | Status: DC
  Administered 2021-09-30 – 2021-10-01 (×2): 81 mg via ORAL
  Filled 2021-09-30 (×2): qty 1

## 2021-09-30 MED ORDER — KETOROLAC TROMETHAMINE 30 MG/ML IJ SOLN
INTRAMUSCULAR | Status: AC
  Filled 2021-09-30: qty 1

## 2021-09-30 MED ORDER — ACETAMINOPHEN 500 MG PO TABS
1000.0000 mg | ORAL_TABLET | Freq: Four times a day (QID) | ORAL | Status: AC
  Administered 2021-09-30 – 2021-10-01 (×3): 1000 mg via ORAL
  Filled 2021-09-30 (×2): qty 2

## 2021-09-30 MED ORDER — MENTHOL 3 MG MT LOZG: 1.0000 | LOZENGE | OROMUCOSAL | Status: DC | PRN

## 2021-09-30 MED ORDER — BUPIVACAINE-EPINEPHRINE (PF) 0.25% -1:200000 IJ SOLN
INTRAMUSCULAR | Status: DC | PRN
  Administered 2021-09-30: 30 mL

## 2021-09-30 MED ORDER — OXYCODONE HCL 5 MG PO TABS
ORAL_TABLET | ORAL | Status: AC
  Filled 2021-09-30: qty 1

## 2021-09-30 MED ORDER — MEPERIDINE HCL 50 MG/ML IJ SOLN: 6.2500 mg | INTRAMUSCULAR | Status: DC | PRN

## 2021-09-30 MED ORDER — HYDROMORPHONE HCL 1 MG/ML IJ SOLN
0.2500 mg | INTRAMUSCULAR | Status: DC | PRN
  Administered 2021-09-30 (×2): 0.5 mg via INTRAVENOUS

## 2021-09-30 MED ORDER — KETOROLAC TROMETHAMINE 30 MG/ML IJ SOLN
INTRAMUSCULAR | Status: DC | PRN
  Administered 2021-09-30: 30 mg

## 2021-09-30 MED ORDER — LACTATED RINGERS IV SOLN: INTRAVENOUS | Status: DC

## 2021-09-30 MED ORDER — TRANEXAMIC ACID-NACL 1000-0.7 MG/100ML-% IV SOLN
1000.0000 mg | Freq: Once | INTRAVENOUS | Status: AC
  Administered 2021-09-30: 1000 mg via INTRAVENOUS
  Filled 2021-09-30: qty 100

## 2021-09-30 MED ORDER — DIPHENHYDRAMINE HCL 12.5 MG/5ML PO ELIX: 12.5000 mg | ORAL_SOLUTION | ORAL | Status: DC | PRN

## 2021-09-30 MED ORDER — CEFAZOLIN SODIUM-DEXTROSE 2-4 GM/100ML-% IV SOLN
2.0000 g | Freq: Four times a day (QID) | INTRAVENOUS | Status: AC
  Administered 2021-09-30 – 2021-10-01 (×2): 2 g via INTRAVENOUS
  Filled 2021-09-30 (×2): qty 100

## 2021-09-30 MED ORDER — OXYCODONE HCL 5 MG PO TABS: 10.0000 mg | ORAL_TABLET | ORAL | Status: DC | PRN

## 2021-09-30 MED ORDER — DEXMEDETOMIDINE HCL IN NACL 80 MCG/20ML IV SOLN
INTRAVENOUS | Status: DC | PRN
  Administered 2021-09-30: 8 ug via BUCCAL
  Administered 2021-09-30: 12 ug via BUCCAL
  Administered 2021-09-30: 20 ug via BUCCAL

## 2021-09-30 MED ORDER — BISACODYL 10 MG RE SUPP: 10.0000 mg | Freq: Every day | RECTAL | Status: DC | PRN

## 2021-09-30 MED ORDER — HYDROMORPHONE HCL 1 MG/ML IJ SOLN
0.2500 mg | INTRAMUSCULAR | Status: DC | PRN
  Administered 2021-09-30 (×4): 0.5 mg via INTRAVENOUS

## 2021-09-30 MED ORDER — AMLODIPINE BESYLATE 10 MG PO TABS
10.0000 mg | ORAL_TABLET | Freq: Every day | ORAL | Status: DC
  Administered 2021-10-01: 10 mg via ORAL
  Filled 2021-09-30: qty 1

## 2021-09-30 MED ORDER — FENTANYL CITRATE PF 50 MCG/ML IJ SOSY
50.0000 ug | PREFILLED_SYRINGE | INTRAMUSCULAR | Status: DC
  Administered 2021-09-30: 100 ug via INTRAVENOUS
  Filled 2021-09-30: qty 2

## 2021-09-30 MED ORDER — MECLIZINE HCL 25 MG PO TABS
25.0000 mg | ORAL_TABLET | Freq: Every day | ORAL | Status: DC
  Administered 2021-10-01: 25 mg via ORAL
  Filled 2021-09-30: qty 1

## 2021-09-30 MED ORDER — MIDAZOLAM HCL 2 MG/2ML IJ SOLN: 0.5000 mg | Freq: Once | INTRAMUSCULAR | Status: DC | PRN

## 2021-09-30 MED ORDER — METOCLOPRAMIDE HCL 5 MG/ML IJ SOLN: 5.0000 mg | Freq: Three times a day (TID) | INTRAMUSCULAR | Status: DC | PRN

## 2021-09-30 MED ORDER — DOCUSATE SODIUM 100 MG PO CAPS
100.0000 mg | ORAL_CAPSULE | Freq: Two times a day (BID) | ORAL | Status: DC
  Administered 2021-09-30 – 2021-10-01 (×2): 100 mg via ORAL
  Filled 2021-09-30 (×2): qty 1

## 2021-09-30 MED ORDER — PROPOFOL 10 MG/ML IV BOLUS
INTRAVENOUS | Status: DC | PRN
  Administered 2021-09-30: 20 mg via INTRAVENOUS
  Administered 2021-09-30 (×2): 10 mg via INTRAVENOUS
  Administered 2021-09-30: 20 mg via INTRAVENOUS

## 2021-09-30 MED ORDER — ONDANSETRON HCL 4 MG PO TABS: 4.0000 mg | ORAL_TABLET | Freq: Four times a day (QID) | ORAL | Status: DC | PRN

## 2021-09-30 MED ORDER — ONDANSETRON HCL 4 MG/2ML IJ SOLN
INTRAMUSCULAR | Status: DC | PRN
  Administered 2021-09-30: 4 mg via INTRAVENOUS

## 2021-09-30 MED ORDER — SODIUM CHLORIDE 0.9 % IV SOLN: INTRAVENOUS | Status: DC

## 2021-09-30 MED ORDER — DEXAMETHASONE SODIUM PHOSPHATE 10 MG/ML IJ SOLN
8.0000 mg | Freq: Once | INTRAMUSCULAR | Status: AC
  Administered 2021-09-30: 8 mg via INTRAVENOUS

## 2021-09-30 MED ORDER — FUROSEMIDE 40 MG PO TABS
40.0000 mg | ORAL_TABLET | Freq: Every day | ORAL | Status: DC
  Administered 2021-10-01: 40 mg via ORAL
  Filled 2021-09-30: qty 1

## 2021-09-30 MED ORDER — PHENOL 1.4 % MT LIQD: 1.0000 | OROMUCOSAL | Status: DC | PRN

## 2021-09-30 MED ORDER — SODIUM CHLORIDE (PF) 0.9 % IJ SOLN
INTRAMUSCULAR | Status: DC | PRN
  Administered 2021-09-30: 30 mL

## 2021-09-30 MED ORDER — HYDROMORPHONE HCL 1 MG/ML IJ SOLN
INTRAMUSCULAR | Status: AC
  Filled 2021-09-30: qty 1

## 2021-09-30 MED ORDER — POLYETHYLENE GLYCOL 3350 17 G PO PACK
17.0000 g | PACK | Freq: Every day | ORAL | Status: DC | PRN
  Filled 2021-09-30: qty 1

## 2021-09-30 MED ORDER — ACETAMINOPHEN 500 MG PO TABS
1000.0000 mg | ORAL_TABLET | Freq: Once | ORAL | Status: AC
  Administered 2021-09-30: 1000 mg via ORAL
  Filled 2021-09-30: qty 2

## 2021-09-30 MED ORDER — PHENYLEPHRINE HCL-NACL 20-0.9 MG/250ML-% IV SOLN
INTRAVENOUS | Status: DC | PRN
  Administered 2021-09-30: 20 ug/min via INTRAVENOUS

## 2021-09-30 MED ORDER — TRANEXAMIC ACID-NACL 1000-0.7 MG/100ML-% IV SOLN
1000.0000 mg | INTRAVENOUS | Status: AC
  Administered 2021-09-30: 1000 mg via INTRAVENOUS
  Filled 2021-09-30: qty 100

## 2021-09-30 MED ORDER — DEXAMETHASONE SODIUM PHOSPHATE 10 MG/ML IJ SOLN
10.0000 mg | Freq: Once | INTRAMUSCULAR | Status: AC
  Administered 2021-10-01: 10 mg via INTRAVENOUS
  Filled 2021-09-30: qty 1

## 2021-09-30 MED ORDER — PROMETHAZINE HCL 25 MG/ML IJ SOLN: 6.2500 mg | INTRAMUSCULAR | Status: DC | PRN

## 2021-09-30 MED ORDER — 0.9 % SODIUM CHLORIDE (POUR BTL) OPTIME
TOPICAL | Status: DC | PRN
  Administered 2021-09-30: 1000 mL

## 2021-09-30 MED ORDER — OXYCODONE HCL 5 MG/5ML PO SOLN: 5.0000 mg | Freq: Once | ORAL | Status: AC | PRN

## 2021-09-30 MED ORDER — TRAMADOL HCL 50 MG PO TABS
50.0000 mg | ORAL_TABLET | Freq: Four times a day (QID) | ORAL | Status: DC | PRN
  Administered 2021-10-01 (×2): 100 mg via ORAL
  Filled 2021-09-30 (×2): qty 2

## 2021-09-30 MED ORDER — STERILE WATER FOR IRRIGATION IR SOLN
Status: DC | PRN
  Administered 2021-09-30: 2000 mL

## 2021-09-30 MED ORDER — POVIDONE-IODINE 10 % EX SWAB
2.0000 | Freq: Once | CUTANEOUS | Status: AC
  Administered 2021-09-30: 2 via TOPICAL

## 2021-09-30 MED ORDER — METOCLOPRAMIDE HCL 5 MG PO TABS: 5.0000 mg | ORAL_TABLET | Freq: Three times a day (TID) | ORAL | Status: DC | PRN

## 2021-09-30 MED ORDER — HYDROMORPHONE HCL 1 MG/ML IJ SOLN: 0.5000 mg | INTRAMUSCULAR | Status: DC | PRN

## 2021-09-30 MED ORDER — BUPIVACAINE-EPINEPHRINE (PF) 0.25% -1:200000 IJ SOLN
INTRAMUSCULAR | Status: AC
  Filled 2021-09-30: qty 30

## 2021-09-30 MED ORDER — TRAZODONE HCL 50 MG PO TABS
50.0000 mg | ORAL_TABLET | Freq: Every evening | ORAL | Status: DC | PRN
  Administered 2021-09-30: 50 mg via ORAL
  Filled 2021-09-30: qty 1

## 2021-09-30 MED ORDER — BUPIVACAINE IN DEXTROSE 0.75-8.25 % IT SOLN
INTRATHECAL | Status: DC | PRN
  Administered 2021-09-30: 2 mL via INTRATHECAL

## 2021-09-30 MED ORDER — CHLORHEXIDINE GLUCONATE 0.12 % MT SOLN
15.0000 mL | Freq: Once | OROMUCOSAL | Status: AC
  Administered 2021-09-30: 15 mL via OROMUCOSAL

## 2021-09-30 MED ORDER — ONDANSETRON HCL 4 MG/2ML IJ SOLN: 4.0000 mg | Freq: Four times a day (QID) | INTRAMUSCULAR | Status: DC | PRN

## 2021-09-30 MED ORDER — LISINOPRIL 20 MG PO TABS
40.0000 mg | ORAL_TABLET | Freq: Every day | ORAL | Status: DC
  Administered 2021-10-01: 40 mg via ORAL
  Filled 2021-09-30: qty 2

## 2021-09-30 MED ORDER — METHOCARBAMOL 500 MG IVPB - SIMPLE MED
500.0000 mg | Freq: Four times a day (QID) | INTRAVENOUS | Status: DC | PRN
  Administered 2021-09-30: 500 mg via INTRAVENOUS

## 2021-09-30 MED ORDER — OXYCODONE HCL 5 MG PO TABS
5.0000 mg | ORAL_TABLET | ORAL | Status: DC | PRN
  Administered 2021-09-30: 10 mg via ORAL
  Filled 2021-09-30 (×2): qty 2

## 2021-09-30 MED ORDER — FERROUS SULFATE 325 (65 FE) MG PO TABS
325.0000 mg | ORAL_TABLET | Freq: Three times a day (TID) | ORAL | Status: DC
  Administered 2021-10-01 (×2): 325 mg via ORAL
  Filled 2021-09-30 (×2): qty 1

## 2021-09-30 MED ORDER — DEXTROSE 5 % IV SOLN
INTRAVENOUS | Status: DC | PRN
  Administered 2021-09-30: 3 g via INTRAVENOUS

## 2021-09-30 MED ORDER — TAMSULOSIN HCL 0.4 MG PO CAPS
0.4000 mg | ORAL_CAPSULE | Freq: Every day | ORAL | Status: DC
  Administered 2021-10-01: 0.4 mg via ORAL
  Filled 2021-09-30: qty 1

## 2021-09-30 MED ORDER — METHOCARBAMOL 500 MG IVPB - SIMPLE MED
INTRAVENOUS | Status: AC
  Filled 2021-09-30: qty 55

## 2021-09-30 MED ORDER — VANCOMYCIN HCL 1000 MG IV SOLR
INTRAVENOUS | Status: AC
  Filled 2021-09-30: qty 20

## 2021-09-30 MED ORDER — OXYCODONE HCL 5 MG PO TABS
5.0000 mg | ORAL_TABLET | Freq: Once | ORAL | Status: AC | PRN
  Administered 2021-09-30: 5 mg via ORAL

## 2021-09-30 MED ORDER — PREGABALIN 75 MG PO CAPS
75.0000 mg | ORAL_CAPSULE | Freq: Two times a day (BID) | ORAL | Status: DC
  Administered 2021-09-30 – 2021-10-01 (×2): 75 mg via ORAL
  Filled 2021-09-30 (×2): qty 1

## 2021-09-30 MED ORDER — SODIUM CHLORIDE 0.9 % IR SOLN
Status: DC | PRN
  Administered 2021-09-30: 1000 mL

## 2021-09-30 MED ORDER — MIDAZOLAM HCL 2 MG/2ML IJ SOLN
1.0000 mg | INTRAMUSCULAR | Status: DC
  Administered 2021-09-30: 2 mg via INTRAVENOUS
  Filled 2021-09-30: qty 2

## 2021-09-30 MED ORDER — ATORVASTATIN CALCIUM 40 MG PO TABS
40.0000 mg | ORAL_TABLET | Freq: Every day | ORAL | Status: DC
  Administered 2021-10-01: 40 mg via ORAL
  Filled 2021-09-30: qty 1

## 2021-09-30 MED ORDER — PROPOFOL 500 MG/50ML IV EMUL
INTRAVENOUS | Status: DC | PRN
  Administered 2021-09-30: 50 ug/kg/min via INTRAVENOUS

## 2021-09-30 MED ORDER — OXYCODONE HCL 5 MG PO TABS
5.0000 mg | ORAL_TABLET | ORAL | Status: DC | PRN
  Administered 2021-09-30: 10 mg via ORAL

## 2021-09-30 MED ORDER — METHOCARBAMOL 500 MG PO TABS
500.0000 mg | ORAL_TABLET | Freq: Four times a day (QID) | ORAL | Status: DC | PRN
  Administered 2021-09-30 – 2021-10-01 (×3): 500 mg via ORAL
  Filled 2021-09-30 (×3): qty 1

## 2021-09-30 MED ORDER — ORAL CARE MOUTH RINSE: 15.0000 mL | Freq: Once | OROMUCOSAL | Status: AC

## 2021-09-30 MED ORDER — ACETAMINOPHEN 500 MG PO TABS
ORAL_TABLET | ORAL | Status: AC
  Filled 2021-09-30: qty 2

## 2021-09-30 MED ORDER — VANCOMYCIN HCL 1 G IV SOLR
INTRAVENOUS | Status: DC | PRN
  Administered 2021-09-30: 1000 mg

## 2021-09-30 MED ORDER — SODIUM CHLORIDE (PF) 0.9 % IJ SOLN
INTRAMUSCULAR | Status: AC
  Filled 2021-09-30: qty 40

## 2021-09-30 SURGICAL SUPPLY — 85 items
ATTUNE MED ANAT PAT 41 KNEE (Knees) ×1 IMPLANT
ATTUNE PSRP INSR SZ7 5 KNEE (Insert) ×1 IMPLANT
ATUNE CEMENTED STEM 16X80 (Stem) ×2 IMPLANT
BAG COUNTER SPONGE SURGICOUNT (BAG) ×1 IMPLANT
BAG ZIPLOCK 12X15 (MISCELLANEOUS) ×2 IMPLANT
BANDAGE ESMARK 6X9 LF (GAUZE/BANDAGES/DRESSINGS) ×2 IMPLANT
BASE TIB KNEE REV RP ATUNE SZ6 (Knees) ×1 IMPLANT
BLADE SAW SGTL 11.0X1.19X90.0M (BLADE) ×2 IMPLANT
BLADE SAW SGTL 13.0X1.19X90.0M (BLADE) ×2 IMPLANT
BLADE SAW SGTL 81X20 HD (BLADE) ×2 IMPLANT
BNDG ELASTIC 6X5.8 VLCR STR LF (GAUZE/BANDAGES/DRESSINGS) ×2 IMPLANT
BNDG ESMARK 6X9 LF (GAUZE/BANDAGES/DRESSINGS) ×2
BOWL SMART MIX CTS (DISPOSABLE) ×2 IMPLANT
BRUSH FEMORAL CANAL (MISCELLANEOUS) ×1 IMPLANT
CEMENT HV SMART SET (Cement) ×7 IMPLANT
CEMENT RESTRICTOR DEPUY SZ 7 (Cement) ×1 IMPLANT
COMP FEM ATTUNE CRS SZ7 RT (Femur) ×2 IMPLANT
COMPONENT FEM ATN CRS SZ7 RT (Femur) ×1 IMPLANT
COVER SURGICAL LIGHT HANDLE (MISCELLANEOUS) ×2 IMPLANT
CUFF TOURN SGL QUICK 34 (TOURNIQUET CUFF) ×2
CUFF TRNQT CYL 34X4.125X (TOURNIQUET CUFF) ×2 IMPLANT
DERMABOND ADVANCED .7 DNX12 (GAUZE/BANDAGES/DRESSINGS) ×2 IMPLANT
DRAPE INCISE IOBAN 66X45 STRL (DRAPES) ×6 IMPLANT
DRAPE POUCH INSTRU U-SHP 10X18 (DRAPES) ×2 IMPLANT
DRAPE SHEET LG 3/4 BI-LAMINATE (DRAPES) ×2 IMPLANT
DRAPE U-SHAPE 47X51 STRL (DRAPES) ×4 IMPLANT
DRESSING AQUACEL AG SP 3.5X10 (GAUZE/BANDAGES/DRESSINGS) IMPLANT
DRSG ADAPTIC 3X8 NADH LF (GAUZE/BANDAGES/DRESSINGS) ×2 IMPLANT
DRSG AQUACEL AG ADV 3.5X14 (GAUZE/BANDAGES/DRESSINGS) ×1 IMPLANT
DRSG AQUACEL AG SP 3.5X10 (GAUZE/BANDAGES/DRESSINGS)
DURAPREP 26ML APPLICATOR (WOUND CARE) ×4 IMPLANT
ELECT REM PT RETURN 15FT ADLT (MISCELLANEOUS) ×2 IMPLANT
EVACUATOR 1/8 PVC DRAIN (DRAIN) ×2 IMPLANT
FACESHIELD WRAPAROUND (MASK) ×10 IMPLANT
FACESHIELD WRAPAROUND OR TEAM (MASK) ×5 IMPLANT
GAUZE PAD ABD 8X10 STRL (GAUZE/BANDAGES/DRESSINGS) ×2 IMPLANT
GAUZE SPONGE 4X4 12PLY STRL (GAUZE/BANDAGES/DRESSINGS) ×4 IMPLANT
GLOVE BIO SURGEON STRL SZ 6 (GLOVE) ×2 IMPLANT
GLOVE BIOGEL M 7.0 STRL (GLOVE) ×2 IMPLANT
GLOVE BIOGEL PI IND STRL 7.5 (GLOVE) ×6 IMPLANT
GLOVE BIOGEL PI IND STRL 8.5 (GLOVE) ×4 IMPLANT
GLOVE ECLIPSE 8.0 STRL XLNG CF (GLOVE) ×4 IMPLANT
GLOVE INDICATOR 6.5 STRL GRN (GLOVE) ×4 IMPLANT
GOWN STRL REUS W/ TWL LRG LVL3 (GOWN DISPOSABLE) ×6 IMPLANT
GOWN STRL REUS W/TWL LRG LVL3 (GOWN DISPOSABLE) ×6
HANDPIECE INTERPULSE COAX TIP (DISPOSABLE) ×2
HOLDER FOLEY CATH W/STRAP (MISCELLANEOUS) ×2 IMPLANT
IMMOBILIZER KNEE 20 (SOFTGOODS) ×2
IMMOBILIZER KNEE 20 THIGH 36 (SOFTGOODS) ×1 IMPLANT
JET LAVAGE IRRISEPT WOUND (IRRIGATION / IRRIGATOR) ×2
KIT BASIN OR (CUSTOM PROCEDURE TRAY) ×2 IMPLANT
KIT TURNOVER KIT A (KITS) ×1 IMPLANT
LAVAGE JET IRRISEPT WOUND (IRRIGATION / IRRIGATOR) ×2 IMPLANT
MANIFOLD NEPTUNE II (INSTRUMENTS) ×2 IMPLANT
NDL SAFETY ECLIP 18X1.5 (MISCELLANEOUS) ×2 IMPLANT
NS IRRIG 1000ML POUR BTL (IV SOLUTION) ×2 IMPLANT
PACK TOTAL JOINT (CUSTOM PROCEDURE TRAY) ×2 IMPLANT
PACK TOTAL KNEE CUSTOM (KITS) ×2 IMPLANT
PADDING CAST COTTON 6X4 STRL (CAST SUPPLIES) ×4 IMPLANT
PROTECTOR NERVE ULNAR (MISCELLANEOUS) ×2 IMPLANT
SET HNDPC FAN SPRY TIP SCT (DISPOSABLE) ×2 IMPLANT
SET PAD KNEE POSITIONER (MISCELLANEOUS) ×2 IMPLANT
SLEEVE TIB ATTUNE FP 69 (Knees) ×1 IMPLANT
SOLUTION PRONTOSAN WOUND 350ML (IRRIGATION / IRRIGATOR) ×1 IMPLANT
SPONGE T-LAP 18X18 ~~LOC~~+RFID (SPONGE) ×1 IMPLANT
STAPLER VISISTAT 35W (STAPLE) IMPLANT
STEM ATTUNE CEMENTED 16X80 (Stem) ×1 IMPLANT
STEM STR ATTUNE PF 22X60 (Knees) ×1 IMPLANT
SUCTION FRAZIER HANDLE 12FR (TUBING) ×2
SUCTION TUBE FRAZIER 12FR DISP (TUBING) ×2 IMPLANT
SUT MNCRL AB 3-0 PS2 18 (SUTURE) ×2 IMPLANT
SUT STRATAFIX PDS+ 0 24IN (SUTURE) ×3 IMPLANT
SUT VIC AB 1 CT1 36 (SUTURE) ×6 IMPLANT
SUT VIC AB 2-0 CT1 27 (SUTURE) ×8
SUT VIC AB 2-0 CT1 TAPERPNT 27 (SUTURE) ×7 IMPLANT
SWAB COLLECTION DEVICE MRSA (MISCELLANEOUS) IMPLANT
SWAB CULTURE ESWAB REG 1ML (MISCELLANEOUS) IMPLANT
SYR 3ML LL SCALE MARK (SYRINGE) ×2 IMPLANT
SYR 50ML LL SCALE MARK (SYRINGE) ×2 IMPLANT
TOWEL OR 17X26 10 PK STRL BLUE (TOWEL DISPOSABLE) ×4 IMPLANT
TOWER CARTRIDGE SMART MIX (DISPOSABLE) ×1 IMPLANT
TRAY FOLEY MTR SLVR 16FR STAT (SET/KITS/TRAYS/PACK) ×2 IMPLANT
TUBE SUCTION HIGH CAP CLEAR NV (SUCTIONS) ×2 IMPLANT
WATER STERILE IRR 1000ML POUR (IV SOLUTION) ×4 IMPLANT
WRAP KNEE MAXI GEL POST OP (GAUZE/BANDAGES/DRESSINGS) ×2 IMPLANT

## 2021-09-30 NOTE — Anesthesia Procedure Notes (Addendum)
  Anesthesia Regional Block: Adductor canal block   Pre-Anesthetic Checklist: , timeout performed,  Correct Patient, Correct Site, Correct Laterality,  Correct Procedure, Correct Position, site marked,  Risks and benefits discussed,  Surgical consent,  Pre-op evaluation,  At surgeon's request and post-op pain management  Laterality: Right and Lower  Prep: chloraprep       Needles:  Injection technique: Single-shot  Needle Type: Echogenic Needle     Needle Length: 9cm  Needle Gauge: 21     Additional Needles:   Procedures:,,,, ultrasound used (permanent image in chart),,    Narrative:  Start time: 09/30/2021 10:27 AM End time: 09/30/2021 10:33 AM Injection made incrementally with aspirations every 5 mL.  Performed by: Personally  Anesthesiologist: Annye Asa, MD  Additional Notes: Pt identified in Holding room.  Monitors applied. Working IV access confirmed. Sterile prep R thigh.  #21ga ECHOgenic Arrow block needle into adductor canal with US guidance.  20cc 0.75% Ropivacaine injected incrementally after negative test dose.  Patient asymptomatic, VSS, no heme aspirated, tolerated well.   Jenita Seashore, MD

## 2021-09-30 NOTE — Anesthesia Postprocedure Evaluation (Signed)
Anesthesia Post Note  Patient: Travis Myers  Procedure(s) Performed: REIMPLANTATION / REVISON OF TOTAL KNEE (Right: Knee)     Patient location during evaluation: PACU Anesthesia Type: Spinal Level of consciousness: awake and alert, patient cooperative and oriented Pain management: pain level controlled (pain improving) Vital Signs Assessment: post-procedure vital signs reviewed and stable Respiratory status: nonlabored ventilation, spontaneous breathing and respiratory function stable Cardiovascular status: blood pressure returned to baseline and stable Postop Assessment: spinal receding, patient able to bend at knees and no apparent nausea or vomiting Anesthetic complications: no   No notable events documented.  Last Vitals:  Vitals:   09/30/21 1615 09/30/21 1630  BP: 122/74 126/79  Pulse: (!) 51 (!) 53  Resp: 15 12  Temp:    SpO2: 99% 100%    Last Pain:  Vitals:   09/30/21 1615  TempSrc:   PainSc: 6                  Travis Myers,Travis Myers

## 2021-09-30 NOTE — Anesthesia Preprocedure Evaluation (Addendum)
Anesthesia Evaluation  Patient identified by MRN, date of birth, ID band Patient awake    Reviewed: Allergy & Precautions, NPO status , Patient's Chart, lab work & pertinent test results  History of Anesthesia Complications Negative for: history of anesthetic complications  Airway Mallampati: II  TM Distance: >3 FB Neck ROM: Full    Dental  (+) Dental Advisory Given   Pulmonary sleep apnea (does not use CPAP) ,    breath sounds clear to auscultation       Cardiovascular hypertension, Pt. on medications (-) angina+ Peripheral Vascular Disease (h/o asc aortic aneurysm)  + Valvular Problems/Murmurs (mild) AS  Rhythm:Regular Rate:Normal  12/2020 ECHO: EF 60 to 65%. The LV has normal function, no regional wall motion abnormalities. Left ventricular diastolic parameters were normal. RVF normal, mild MR, The aortic valve is normal in structure. AI mild to moderate. Mild AS. AVA, by VTI  2.37 cm. AV mean gradient  12.0 mmHg, Vmax 2.29 m/s.  Aneurysm of the ascending aorta, measuring 44 mm.   Neuro/Psych negative neurological ROS     GI/Hepatic negative GI ROS, Neg liver ROS,   Endo/Other  Morbid obesityBMI 38.7  Renal/GU negative Renal ROS     Musculoskeletal  (+) Arthritis ,   Abdominal (+) + obese,   Peds  Hematology negative hematology ROS (+)   Anesthesia Other Findings   Reproductive/Obstetrics                            Anesthesia Physical Anesthesia Plan  ASA: 3  Anesthesia Plan: Spinal   Post-op Pain Management: Tylenol PO (pre-op)* and Regional block*   Induction:   PONV Risk Score and Plan: 1 and Ondansetron and Treatment may vary due to age or medical condition  Airway Management Planned: Simple Face Mask and Natural Airway  Additional Equipment: None  Intra-op Plan:   Post-operative Plan:   Informed Consent: I have reviewed the patients History and Physical, chart, labs  and discussed the procedure including the risks, benefits and alternatives for the proposed anesthesia with the patient or authorized representative who has indicated his/her understanding and acceptance.     Dental advisory given  Plan Discussed with: CRNA and Surgeon  Anesthesia Plan Comments:        Anesthesia Quick Evaluation

## 2021-09-30 NOTE — Transfer of Care (Signed)
Immediate Anesthesia Transfer of Care Note  Patient: Warner Mccreedy II  Procedure(s) Performed: REIMPLANTATION / REVISON OF TOTAL KNEE (Right: Knee)  Patient Location: PACU  Anesthesia Type:Spinal  Level of Consciousness: drowsy  Airway & Oxygen Therapy: Patient Spontanous Breathing and Patient connected to face mask oxygen  Post-op Assessment: Report given to RN and Post -op Vital signs reviewed and stable  Post vital signs: Reviewed and stable  Last Vitals:  Vitals Value Taken Time  BP 109/71 09/30/21 1412  Temp    Pulse 65 09/30/21 1414  Resp 15 09/30/21 1414  SpO2 99 % 09/30/21 1414  Vitals shown include unvalidated device data.  Last Pain:  Vitals:   09/30/21 0812  TempSrc:   PainSc: 3       Patients Stated Pain Goal: 3 (00/92/33 0076)  Complications: No notable events documented.

## 2021-09-30 NOTE — Discharge Instructions (Signed)

## 2021-09-30 NOTE — Anesthesia Procedure Notes (Signed)
Spinal  Patient location during procedure: OR Start time: 09/30/2021 11:07 AM End time: 09/30/2021 11:09 AM Reason for block: surgical anesthesia Staffing Performed: resident/CRNA  Resident/CRNA: Milford Cage, CRNA Performed by: Milford Cage, CRNA Authorized by: Annye Asa, MD   Preanesthetic Checklist Completed: patient identified, IV checked, site marked, risks and benefits discussed, surgical consent, monitors and equipment checked, pre-op evaluation and timeout performed Spinal Block Patient position: sitting Prep: DuraPrep and site prepped and draped Patient monitoring: heart rate, cardiac monitor, continuous pulse ox and blood pressure Approach: midline Location: L3-4 Injection technique: single-shot Needle Needle type: Pencan  Needle gauge: 24 G Needle length: 10 cm Assessment Sensory level: T6 Events: CSF return Additional Notes Functioning IV was confirmed and monitors were applied. Sterile prep and drape, including hand hygiene and sterile gloves were used. The patient was positioned and the spine was prepped. The skin was anesthetized with lidocaine.  Free flow of clear CSF was obtained prior to injecting local anesthetic into the CSF.  The spinal needle aspirated freely following injection.  The needle was carefully withdrawn.  The patient tolerated the procedure well.

## 2021-09-30 NOTE — H&P (Signed)
TOTAL KNEE ADMISSION H&P  Patient is being admitted for right total knee arthroplasty reimplantation  Subjective:  Chief Complaint: History of PJI, right knee - s/p resection with antibiotic spacer placement  HPI: Travis Myers, 66 y.o. male, has a history of primary right total knee arthroplasty by Dr. Theda Sers on 02/26/20.  He subsequently had a fall resulting in a large hematoma formation which became infected. He was taken back to the OR for irrigation and debridement on 04/27/21. In office aspiration was performed and sent for synovasure culture. This grew staph lugdunensis. CRP elevated to 18, ESR within normal limits at 20. He was treated with IV cefazolin for 6 weeks via PICC line, which was then removed. ESR improved to 9, CRP was not drawn. Clinically, there were no signs of persistent infection.  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[place]) 07/22/2021   Infection of right knee ([place]) 04/26/2021   Infected hematoma    Osteoarthritis of right knee 02/26/2020   Aortic regurgitation 07/26/2018   Spinal stenosis of lumbar region 02/24/2016   HNP (herniated nucleus pulposus), lumbar 02/24/2016   Obese 03/13/2014   S/P right THA, AA 03/11/2014   Preoperative clearance 02/28/2014   Thoracic aortic aneurysm ([place]) 03/14/2013   Essential hypertension 03/14/2013   Hyperlipidemia 03/14/2013   Past Medical History:  Diagnosis Date   Acute meniscal tear of knee left   Aneurysm, ascending aorta ([place]) 4.8cm per ct 2011/ CARDIOLOGSIT--  DR Rollene Fare  LOV  10-07-2011  NOTE W/ CHART AND REQUEST [facility] MYOVIEW AND ECHO TO BE FAXED.   ASYMPTOMATIC   Heart murmur MILD-- ASYMPTOMATIC   Hyperlipidemia    Hypertension    OA (osteoarthritis) of knee left   OSA (obstructive sleep apnea) NON-TOLERANT CPAP   no cpap PT REPORTS NEVER  FINISHED TEST    Pre-diabetes    Sleep apnea    Tinnitus of both ears     Past Surgical History:  Procedure Laterality Date   BILATERAL IINGUINAL HERNIA REPAIR  1963   DOPPLER ECHOCARDIOGRAPHY  10/18/2011   EF >55 %   ENDOVENOUS ABLATION SAPHENOUS VEIN W/ LASER Right 12/30/2020   endovenous laser ablation right greater saphenous vein by Gae Gallop MD   EXCISIONAL TOTAL KNEE ARTHROPLASTY WITH ANTIBIOTIC SPACERS Right 07/22/2021   Procedure: EXCISIONAL TOTAL KNEE ARTHROPLASTY WITH ANTIBIOTIC SPACERS;  Surgeon: Paralee Cancel, MD;  Location: [facility];  Service: Orthopedics;  Laterality: Right;   INCISION AND DRAINAGE OF WOUND Right 04/27/2021   Procedure: IRRIGATION AND DEBRIDEMENT OF HEMATOMA AND ABCESS ,ASPIRATE RIGHT KNEE;  Surgeon: Sydnee Cabal, MD;  Location: [facility];  Service: Orthopedics;  Laterality: Right;  REQUEST 3:30PM START TIME   LUMBAR LAMINECTOMY/DECOMPRESSION MICRODISCECTOMY N/A 02/24/2016   Procedure: Bilateral Decompression L4-5, Discectomy L4-5 ;  Surgeon: Susa Day, MD;  Location: [facility];  Service: Orthopedics;  Laterality: N/A;   NM MYOCAR PERF WALL MOTION  10/13/2011   EF 49 %,low risk study   SHOULDER ARTHROSCOPY Left 2022   TONSILLECTOMY  1965   TORN MENISCUS X 3 IN KNEES SURGERY      LEFT    TOTAL HIP ARTHROPLASTY Left 06/18/2009   OA LEFT HIP   TOTAL HIP ARTHROPLASTY Right 03/11/2014   Procedure: RIGHT TOTAL HIP ARTHROPLASTY ANTERIOR APPROACH;  Surgeon: [name], MD;  Location: [facility];  Service: Orthopedics;  Laterality:  Right;   TOTAL KNEE ARTHROPLASTY Right 02/26/2020   Procedure: TOTAL KNEE ARTHROPLASTY;  Surgeon: Sydnee Cabal, MD;  Location: [facility];  Service: Orthopedics;  Laterality: Right;  adductor canal   TRANSTHORACIC ECHOCARDIOGRAM  09/11/2012   EF 55 to 60 %,mild aortic valve regurg,mild mitral valve regurg,lf and rt atriums mildly dilated   VARICOSE VEIN SURGERY Left 1980   and 1994    No current facility-administered medications  for this encounter.   Current Outpatient Medications  Medication Sig Dispense Refill Last Dose   acetaminophen (TYLENOL) 500 MG tablet Take 1,000 mg by mouth 2 (two) times daily.      amLODipine (NORVASC) 10 MG tablet Take 10 mg by mouth daily.      Ascorbic Acid (VITAMIN C) 1000 MG tablet Take 1,000 mg by mouth daily.      atorvastatin (LIPITOR) 40 MG tablet TAKE 1 TABLET BY MOUTH EVERY DAY 90 tablet 3    cyanocobalamin (VITAMIN B12) 1000 MCG tablet Take 1,000 mcg by mouth daily.      diclofenac (VOLTAREN) 75 MG EC tablet Take 75 mg by mouth 2 (two) times daily.      furosemide (LASIX) 20 MG tablet Take 40 mg by mouth in the morning.  5    lisinopril (PRINIVIL,ZESTRIL) 40 MG tablet Take 40 mg by mouth daily.  5    meclizine (ANTIVERT) 25 MG tablet Take 25 mg by mouth daily.  0    pregabalin (LYRICA) 75 MG capsule Take 75 mg by mouth 2 (two) times daily.      tamsulosin (FLOMAX) 0.4 MG CAPS capsule Take 0.4 mg by mouth daily.      traMADol (ULTRAM) 50 MG tablet Take 100 mg by mouth 3 (three) times daily.      traZODone (DESYREL) 50 MG tablet Take 50 mg by mouth at bedtime as needed for sleep.      triamcinolone (KENALOG) 0.1 % Apply 1 application  topically daily.      docusate sodium (COLACE) 100 MG capsule Take 1 capsule (100 mg total) by mouth 2 (two) times daily. (Patient not taking: Reported on 09/23/2021) 10 capsule 0 Not Taking   polyethylene glycol (MIRALAX / GLYCOLAX) 17 g packet Take 17 g by mouth daily as needed for mild constipation. (Patient not taking: Reported on 09/23/2021) 14 each 0 Not Taking   Allergies  Allergen Reactions   Ciprofloxacin Other (See Comments)    Unknown   Penicillins Other (See Comments)    Childhood reaction- not recalled Has patient had a PCN reaction causing immediate rash, facial/tongue/throat swelling, SOB or lightheadedness with hypotension:unsure Has patient had a PCN reaction causing severe rash involving mucus membranes or skin  necrosis:unsure Has patient had a PCN reaction that required hospitalization:unsure Has patient had a PCN reaction occurring within the last 10 years:No If all of the above answers are "NO", then may proceed with Ceph.use. Tolerated ancef 07/22/21     Quinolones Other (See Comments)    Patient was warned about not using Cipro and similar antibiotics. Recent studies have raised concern that fluoroquinolone antibiotics could be associated with an increased risk of aortic aneurysm Fluoroquinolones have non-antimicrobial properties that might jeopardise the integrity of the extracellular matrix of the vascular wall In a  propensity score matched cohort study in [place], there was a 66% increased rate of aortic aneurysm or dissection associated with oral fluoroquinolone use, compared wit    Social History   Tobacco Use   Smoking status: Never  Smokeless tobacco: Former    Quit date: 09/11/1988  Substance Use Topics   Alcohol use: Yes    Alcohol/week: 1.0 standard drink of alcohol    Types: 1 Standard drinks or equivalent per week    Comment: RARE    Family History  Problem Relation Age of Onset   Diabetes Mother    Cancer Mother    Heart failure Other    Diabetes Other    Cancer Other    COPD Other    Colon cancer Neg Hx    Stomach cancer Neg Hx      Review of Systems  Constitutional:  Negative for chills and fever.  Respiratory:  Negative for cough and shortness of breath.   Cardiovascular:  Negative for chest pain.  Gastrointestinal:  Negative for nausea and vomiting.  Musculoskeletal:  Positive for arthralgias.     Objective:  Physical Exam  Vital signs in last 24 hours:    Labs:   Estimated body mass index is 38.71 kg/m as calculated from the following:   Height as of 09/28/21: '6\' 4"'  (1.93 m).   Weight as of 09/28/21: 144.2 kg.   Imaging Review No New imaging taken between resection surgery and now.      Assessment/Plan:  S/p resection of right TKA,  plan for reimplantation  The patient history, physical examination, clinical judgment of the provider and imaging studies are consistent with S/p resection of right TKA of the right knee(s) and reimplantation of total knee arthroplasty is deemed medically necessary. The treatment options including medical management, injection therapy arthroscopy and arthroplasty were discussed at length. The risks and benefits of total knee arthroplasty were presented and reviewed. The risks due to aseptic loosening, infection, stiffness, patella tracking problems, thromboembolic complications and other imponderables were discussed. The patient acknowledged the explanation, agreed to proceed with the plan and consent was signed. Patient is being admitted for inpatient treatment for surgery, pain control, PT, OT, prophylactic antibiotics, VTE prophylaxis, progressive ambulation and ADL's and discharge planning. The patient is planning to be discharged  home.    Costella Hatcher, PA-C Orthopedic Surgery [facility] [place] [phone]

## 2021-09-30 NOTE — Interval H&P Note (Signed)
History and Physical Interval Note:  09/30/2021 9:34 AM  Travis Myers  has presented today for surgery, with the diagnosis of S/P resection of right total knee arthroplasty and placement of antiobiotic spacer.  The various methods of treatment have been discussed with the patient and family. After consideration of risks, benefits and other options for treatment, the patient has consented to  Procedure(s) with comments: REIMPLANTATION / REVISON OF TOTAL KNEE (Right) - 150 VS REPEAT IRRIGATION AND DEBRIDEMENT WOUND (Right) - 150 POSSIBLE REIMPLANTATION OF ANITBIOTIC SPACER  KNEE (Right) - 150 as a surgical intervention.  The patient's history has been reviewed, patient examined, no change in status, stable for surgery.  I have reviewed the patient's chart and labs.  Questions were answered to the patient's satisfaction.     Travis Myers

## 2021-10-01 ENCOUNTER — Encounter (HOSPITAL_COMMUNITY): Payer: Self-pay | Admitting: Orthopedic Surgery

## 2021-10-01 ENCOUNTER — Inpatient Hospital Stay (HOSPITAL_COMMUNITY): Payer: BC Managed Care – PPO

## 2021-10-01 LAB — CBC
HCT: 32.1 % — ABNORMAL LOW (ref 39.0–52.0)
Hemoglobin: 10.7 g/dL — ABNORMAL LOW (ref 13.0–17.0)
MCH: 31.8 pg (ref 26.0–34.0)
MCHC: 33.3 g/dL (ref 30.0–36.0)
MCV: 95.5 fL (ref 80.0–100.0)
Platelets: 141 10*3/uL — ABNORMAL LOW (ref 150–400)
RBC: 3.36 MIL/uL — ABNORMAL LOW (ref 4.22–5.81)
RDW: 13.6 % (ref 11.5–15.5)
WBC: 12.1 10*3/uL — ABNORMAL HIGH (ref 4.0–10.5)
nRBC: 0 % (ref 0.0–0.2)

## 2021-10-01 LAB — BASIC METABOLIC PANEL
Anion gap: 8 (ref 5–15)
BUN: 21 mg/dL (ref 8–23)
CO2: 22 mmol/L (ref 22–32)
Calcium: 8.3 mg/dL — ABNORMAL LOW (ref 8.9–10.3)
Chloride: 108 mmol/L (ref 98–111)
Creatinine, Ser: 0.92 mg/dL (ref 0.61–1.24)
GFR, Estimated: >60 mL/min (ref >60)
Glucose, Bld: 182 mg/dL — ABNORMAL HIGH (ref 70–99)
Potassium: 4 mmol/L (ref 3.5–5.1)
Sodium: 138 mmol/L (ref 135–145)

## 2021-10-01 LAB — C-REACTIVE PROTEIN: CRP: 0.8 mg/dL (ref <1.0)

## 2021-10-01 MED ORDER — METHOCARBAMOL 500 MG PO TABS: 500.0000 mg | ORAL_TABLET | Freq: Four times a day (QID) | ORAL | 2 refills | Status: DC | PRN

## 2021-10-01 MED ORDER — ASPIRIN 81 MG PO CHEW: 81.0000 mg | CHEWABLE_TABLET | Freq: Two times a day (BID) | ORAL | 0 refills | Status: AC

## 2021-10-01 MED ORDER — CEFADROXIL 500 MG PO CAPS: 500.0000 mg | ORAL_CAPSULE | Freq: Two times a day (BID) | ORAL | 0 refills | Status: AC

## 2021-10-01 MED ORDER — OXYCODONE HCL 5 MG PO TABS: 5.0000 mg | ORAL_TABLET | ORAL | 0 refills | Status: DC | PRN

## 2021-10-01 NOTE — Op Note (Unsigned)
NAME: Travis Myers, Travis Myers MEDICAL RECORD NO: [id number] ACCOUNT NO: [id number] DATE OF BIRTH: 1955-07-10 FACILITY: [facility] PHYSICIAN: Pietro Cassis. Alvan Dame, MD  Operative Report   DATE OF PROCEDURE: 09/30/2021   PREOPERATIVE DIAGNOSIS:  History of right total knee arthroplasty complicated by infections, now status post resection arthroplasty with placement of an articulating antibiotic spacer.  POSTOPERATIVE DIAGNOSIS:  History of right total knee arthroplasty complicated by infection, now status post resection arthroplasty with placement of an articulating antibiotic spacer.  PROCEDURE:  Reimplantation/revision right total knee arthroplasty.  COMPONENTS USED:  A DePuy Attune revision knee system with a size 7 revision right femoral component with a 16 x 80 cemented stem.  The tibia side was a size 6 revision tibial tray with a 69 press-fit sleeve and a 16 x 22 mm press-fit stem.  We used a  size 5 posterior stabilized insert for the 7 femur and a 41 patellar button.  SURGEON:  Pietro Cassis. Alvan Dame, MD  ASSISTANT:  Costella Hatcher, PA-C.  Note, Ms. Travis Myers was present for the entirety of the case from preoperative positioning, perioperative management of the operative extremity, general facilitation and primary wound closure.  ANESTHESIA:  Regional plus spinal.  DRAINS:  None.  COMPLICATIONS:  None.  TOURNIQUET:  Up for a total of 97 minutes at 225 mmHg.  BLOOD LOSS:  About 800 mL.  DRAINS:  None.  COMPLICATIONS:  None apparent.  INDICATIONS FOR PROCEDURE:  The patient is a very pleasant 66 year old male with a history of right total knee arthroplasty within the past 2 years.  He was seen and evaluated on referral for persistent pain.  Workup revealed signs of infection.  He had  a resection of his infected total knee arthroplasty with placement of antibiotic spacer.  He was treated with IV antibiotics.  As we monitored his clinical course and laboratory studies, his  infection appeared to be cleared with normalization of his  sedimentation rate and C-reactive protein.  He was scheduled for reimplantation.  Risks of recurrence of infection, DVT, component failure, stiffness, and need for future surgeries were discussed and reviewed.  Consent was obtained for management of his  right knee.  DESCRIPTION OF PROCEDURE:  The patient was brought to the operative theater.  Once adequate anesthesia, preoperative antibiotics, Ancef administered, 3 grams as well as tranexamic acid and Decadron he was positioned supine with a right thigh tourniquet  placed.  The right lower extremity was prepped and draped in sterile fashion.  A timeout was performed identifying the patient, planned procedure, and extremity.  His old incision was slightly extended proximal and distal for exposure.  His old incision  was excised.  Soft tissue planes were created.  Median arthrotomy was made.  As we entered the joint, we noted that there was a serous effusion.  There were no signs of infection.  There was no evidence of any acute fracture or injury noted.  Once the  knee was exposed, recreating the medial and lateral gutters and suprapatellar pouch, we did address the patella.  We did take a measurement of the patella and resected down to about 13-14 mm noting very sclerotic bone over the lateral facet.  I did use a  41 button and drilled the lug holes for this and placed a metal shim to protect it from the retractors and saw blades throughout the case.  As we were able now to flex the knee up, I was able to use an osteotome, we removed the femoral  cement mold.  I  then was able to remove the cement dowels from the femur without any issue.  We removed fibrous tissue for debridement purposes.  We then worked on the tibia side.  We were able to remove the tibial tray cement molds followed by the proximal dowel.   Distal dowel had been placed.  After at least 30 minutes of trying to remove it out from  the proximal aspect of the tibia, it was not seemed to loosen up.  I then checked the depth of the dowel and was able to place a trial component without impingement  or abutment against the cement dowel.  For that reason, I just elected to leave this to prevent further morbidity and potential complications by removing this.  Given this, we did go ahead and prepared the femur and the tibia for stems.  I focused on the  tibia first making a slight cut on the proximal tibia just to freshen up the cut knowing that the tray would likely be sitting above this anyways.  I then broached up to the size 6 and 9 broach to leave this slightly proud to restore the joint line.   Again using a small 60 mm stem there was no evidence of this impinging or abutting the cement out distally or preventing it from sitting where it needed to be based on placing the broach with and without the stem.  Once this was done, we left the broach  in place and placed a tibial tray on top of this.  We then focused on the femoral preparation.  We revisited the distal cut and then sized to be a size 7.  The size 7 cutting blocks were positioned onto the distal femur based off the rotation and  position of the tibial tray.  All cuts were revisited including the box cut.  At this point, we did a trial reduction.  During the trial reduction, I did find that with the component position where they were the size 5 mm insert allowed the knee to come  to full extension and flex.  The knee was tight in flexion as to be expected given the procedures previously performed.  Given all these findings, we removed all the trial components.  We irrigated the canal with a canal brush irrigator on both sides.   We then irrigated the knee and injected the synovial capsular junction knee with 0.25% Marcaine with epinephrine and 1 mL of Toradol.  The final components were opened and configured on the back table under direct supervision.  Once the tibial component   was configured it was a press-fit component with it was impacted with the broach set.  I elected to cement the femoral component in.  We mixed three batches of cement with 1 gram of vancomycin.  The final femoral component was cemented into place with a  cement restrictor placed.  The knee was brought to extension with a 5-mm insert and allowed to sit there until the cement fully cured.  Once the cement cured, we removed excessive cement.  Based on the trial reductions a size 5 posterior stabilized  insert was selected and placed into the tibia.  The knee was reduced.  The knee was re-irrigated with a combination of both normal saline solution as well as Prontosan solution for antimicrobial purposes.  The soft tissue envelope was then reapproximated  using a combination of #1 Vicryl and #1 Stratafix suture.  The remainder of the wound was closed  with 2-0 Vicryl and a running Monocryl stitch.  The wound was clean, dry and dressed sterilely with Dermabond and Aquacel dressing.  The patient was brought  to the recovery room in stable condition tolerating the procedure well.  Findings reviewed with his wife.  Given the intraoperative findings we will allow him to be weightbearing as tolerated.  He will progress with therapy per expected routine.     [email or web address] D: 10/01/2021 7:53:33 am T: 10/01/2021 8:22:00 am  JOB: [id number]/ [id number]

## 2021-10-01 NOTE — Brief Op Note (Signed)
09/30/2021  7:45 AM  PATIENT:  Travis Myers  66 y.o. male  PRE-OPERATIVE DIAGNOSIS:  S/P resection of right total knee arthroplasty and placement of antiobiotic spacer  POST-OPERATIVE DIAGNOSIS:  S/P resection of right total knee arthroplasty and placement of antiobiotic spacer  PROCEDURE:  Procedure(s) with comments: REIMPLANTATION / REVISON OF TOTAL KNEE (Right) -   SURGEON:  Surgeon(s) and Role:    Paralee Cancel, MD - Primary  PHYSICIAN ASSISTANT: Costella Hatcher, PA-C  ANESTHESIA:   regional and spinal  EBL:  800 mL   BLOOD ADMINISTERED:none  DRAINS: none   LOCAL MEDICATIONS USED:  MARCAINE     SPECIMEN:  No Specimen  DISPOSITION OF SPECIMEN:  N/A  COUNTS:  YES  TOURNIQUET:   Total Tourniquet Time Documented: Thigh (Right) - 71 minutes Thigh (Right) - 26 minutes Total: Thigh (Right) - 97 minutes   DICTATION: .Other Dictation: Dictation Number [id number]  PLAN OF CARE: Admit to inpatient   PATIENT DISPOSITION:  PACU - hemodynamically stable.   Delay start of Pharmacological VTE agent (>24hrs) due to surgical blood loss or risk of bleeding: no

## 2021-10-01 NOTE — Progress Notes (Signed)
   Subjective: 1 Day Post-Op Procedure(s) (LRB): REIMPLANTATION / REVISON OF TOTAL KNEE (Right) Patient reports pain as mild.   Patient seen in rounds with Dr. Alvan Dame. Patient is well, and has had no acute complaints or problems. No acute events overnight. Foley catheter removed. Patient has not been up with PT.  We will start therapy today.   Objective: Vital signs in last 24 hours: Temp:  [97 F (36.1 C)-98.3 F (36.8 C)] 97.9 F (36.6 C) (09/22 0504) Pulse Rate:  [50-84] 66 (09/22 0504) Resp:  [10-18] 17 (09/22 0504) BP: (98-135)/(58-87) 108/65 (09/22 0504) SpO2:  [92 %-100 %] 96 % (09/22 0504) Weight:  [144.2 kg] 144.2 kg (09/21 0812)  Intake/Output from previous day:  Intake/Output Summary (Last 24 hours) at 10/01/2021 0756 Last data filed at 10/01/2021 0504 Gross per 24 hour  Intake 3669.53 ml  Output 2900 ml  Net 769.53 ml     Intake/Output this shift: No intake/output data recorded.  Labs: Recent Labs    10/01/21 0341  HGB 10.7*   Recent Labs    10/01/21 0341  WBC 12.1*  RBC 3.36*  HCT 32.1*  PLT 141*   Recent Labs    10/01/21 0341  NA 138  K 4.0  CL 108  CO2 22  BUN 21  CREATININE 0.92  GLUCOSE 182*  CALCIUM 8.3*   No results for input(s): "LABPT", "INR" in the last 72 hours.  Exam: General - Patient is Alert and Oriented Extremity - Neurologically intact Sensation intact distally Intact pulses distally Dorsiflexion/Plantar flexion intact Dressing - dressing C/D/I Motor Function - intact, moving foot and toes well on exam.   Past Medical History:  Diagnosis Date   Acute meniscal tear of knee left   Aneurysm, ascending aorta ([place]) 4.8cm per ct 2011/ CARDIOLOGSIT--  DR Rollene Fare  LOV  10-07-2011  NOTE W/ CHART AND REQUEST [facility] MYOVIEW AND ECHO TO BE FAXED.   ASYMPTOMATIC   Heart murmur MILD-- ASYMPTOMATIC   Hyperlipidemia    Hypertension    OA (osteoarthritis) of knee left   OSA (obstructive sleep apnea) NON-TOLERANT CPAP   no  cpap PT REPORTS NEVER FINISHED TEST    Pre-diabetes    Sleep apnea    Tinnitus of both ears     Assessment/Plan: 1 Day Post-Op Procedure(s) (LRB): REIMPLANTATION / REVISON OF TOTAL KNEE (Right) Principal Problem:   S/P revision of total knee, right  Estimated body mass index is 38.71 kg/m as calculated from the following:   Height as of this encounter: '6\' 4"'$  (1.93 m).   Weight as of this encounter: 144.2 kg. Advance diet Up with therapy D/C IV fluids   DVT Prophylaxis - Aspirin Weight bearing as tolerated.  Hgb stable at 10.7 this AM.  We discussed oral antibiotics for 3-6 months, and will start with cefadroxil for 6 weeks to see how he tolerates this.   Plan is to go Home after hospital stay. Plan for discharge today following 1-2 sessions of PT as long as they are meeting their goals. Patient is scheduled for OPPT. Follow up in the office in 2 weeks.   Griffith Citron, PA-C Orthopedic Surgery [phone] 10/01/2021, 7:56 AM

## 2021-10-01 NOTE — Evaluation (Signed)
Physical Therapy One Time Evaluation Patient Details Name: Travis Myers MRN: [id number] DOB: Jan 04, 1956 Today's Date: 10/01/2021  History of Present Illness  66 yo male admitted for right knee reimplanation and revision on 09/30/21. Pt s/p  R TKR resection with spacer 07/2021 and S/P I&D of hematoma/abscess and knee aspiration on 04/27/21. PMH: R TKA 02/26/20, HTN, HLD, pre-DM, sleep apnea, AAA, meniscal tear L knee, R THA 2016, L THA 2011 lumbar laminectomy/decompression 2018  Clinical Impression  Patient evaluated by Physical Therapy with no further acute PT needs identified. All education has been completed and the patient has no further questions.   Pt ambulated in hallway and practiced safe stair technique.  Pt provided with HEP handout which he reviewed and stated all exercises were familiar to him (from previous surgeries) and agreeable to perform over the weekend until f/u OPPT visit on Monday.  Pt had no further questions and feels ready for d/c home today. See below for any follow-up Physical Therapy or equipment needs. PT is signing off. Thank you for this referral.        Recommendations for follow up therapy are one component of a multi-disciplinary discharge planning process, led by the attending physician.  Recommendations may be updated based on patient status, additional functional criteria and insurance authorization.  Follow Up Recommendations Follow physician's recommendations for discharge plan and follow up therapies (plans for OPPT f/u)      Assistance Recommended at Discharge PRN  Patient can return home with the following       Equipment Recommendations None recommended by PT  Recommendations for Other Services       Functional Status Assessment Patient has had a recent decline in their functional status and demonstrates the ability to make significant improvements in function in a reasonable and predictable amount of time.     Precautions / Restrictions  Precautions Precautions: Fall;Knee Restrictions RLE Weight Bearing: Weight bearing as tolerated      Mobility  Bed Mobility Overal bed mobility: Needs Assistance Bed Mobility: Supine to Sit     Supine to sit: Min assist     General bed mobility comments: assist for R LE per pt request    Transfers Overall transfer level: Needs assistance Equipment used: Rolling walker (2 wheels) Transfers: Sit to/from Stand Sit to Stand: Min guard, Supervision           General transfer comment: verbal cues for LE positioning for pain control    Ambulation/Gait Ambulation/Gait assistance: Min guard, Supervision Gait Distance (Feet): 200 Feet Assistive device: Rolling walker (2 wheels) Gait Pattern/deviations: Step-to pattern, Antalgic, Decreased stance time - right       General Gait Details: verbal cues for sequence, step length, encouraged knee flexion with swing through as able, heel strike  Stairs Stairs: Yes Stairs assistance: Min guard Stair Management: Step to pattern, Forwards, One rail Right, With cane Number of Stairs: 3 General stair comments: verbal cues for sequence and safety, pt performed well and reports understanding  Wheelchair Mobility    Modified Rankin (Stroke Patients Only)       Balance                                             Pertinent Vitals/Pain Pain Assessment Pain Assessment: 0-10 Pain Score: 5  Pain Location: right knee Pain Descriptors / Indicators: Sore Pain Intervention(s): Repositioned,  Monitored during session    Home Living Family/patient expects to be discharged to:: Private residence Living Arrangements: Spouse/significant other Available Help at Discharge: Family Type of Home: House Home Access: Stairs to enter Entrance Stairs-Rails: Right Entrance Stairs-Number of Steps: 3   Home Layout: One level Farmers: Conservation officer, nature (2 wheels);Cane - single point;BSC/3in1      Prior Function Prior  Level of Function : Independent/Modified Independent                     Hand Dominance        Extremity/Trunk Assessment        Lower Extremity Assessment Lower Extremity Assessment: RLE deficits/detail RLE Deficits / Details: AROM limitations as pt had knee spacer prior to this surgery, unable to perform SLR (pt reports leg feeling heavy, also more edematous then L LE)       Communication   Communication: No difficulties  Cognition Arousal/Alertness: Awake/alert Behavior During Therapy: WFL for tasks assessed/performed Overall Cognitive Status: Within Functional Limits for tasks assessed                                          General Comments      Exercises     Assessment/Plan    PT Assessment All further PT needs can be met in the next venue of care  PT Problem List Decreased strength;Decreased range of motion       PT Treatment Interventions      PT Goals (Current goals can be found in the Care Plan section)  Acute Rehab PT Goals PT Goal Formulation: All assessment and education complete, DC therapy    Frequency       Co-evaluation               AM-PAC PT "6 Clicks" Mobility  Outcome Measure Help needed turning from your back to your side while in a flat bed without using bedrails?: A Little Help needed moving from lying on your back to sitting on the side of a flat bed without using bedrails?: A Little Help needed moving to and from a bed to a chair (including a wheelchair)?: A Little Help needed standing up from a chair using your arms (e.g., wheelchair or bedside chair)?: A Little Help needed to walk in hospital room?: A Little Help needed climbing 3-5 steps with a railing? : A Little 6 Click Score: 18    End of Session Equipment Utilized During Treatment: Gait belt Activity Tolerance: Patient tolerated treatment well Patient left: in chair;with call bell/phone within reach (on [facility], RN aware) Nurse Communication:  Mobility status PT Visit Diagnosis: Other abnormalities of gait and mobility (R26.89)    Time: 3149-7026 PT Time Calculation (min) (ACUTE ONLY): 24 min   Charges:   PT Evaluation $PT Eval Low Complexity: 1 Low PT Treatments $Gait Training: 8-22 mins       Jannette Spanner PT, DPT Physical Therapist Acute Rehabilitation Services Preferred contact method: Secure Chat Weekend Pager Only: [phone] Office: [phone]   Myrtis Hopping [place] 10/01/2021, 11:51 AM

## 2021-10-01 NOTE — TOC Transition Note (Signed)
Transition of Care [facility]) - CM/SW Discharge Note   Patient Details  Name: Travis Myers MRN: [id number] Date of Birth: 06/13/1955  [facility] CM/SW Contact:  Lennart Pall, LCSW Phone Number: 10/01/2021, 10:37 AM   Clinical Narrative:    Met with pt and confirming he has all needed DME at home.  OPPT already set up with [facility].  No TOC needs.   Final next level of care: OP Rehab Barriers to Discharge: No Barriers Identified   Patient Goals and CMS Choice Patient states their goals for this hospitalization and ongoing recovery are:: return home      Discharge Placement                       Discharge Plan and Services                DME Arranged: N/A DME Agency: NA                  Social Determinants of Health (SDOH) Interventions     Readmission Risk Interventions    10/01/2021   10:37 AM 07/23/2021    1:22 PM  Readmission Risk Prevention Plan  Post Dischage Appt Complete Complete  Medication Screening Complete Complete  Transportation Screening Complete Complete

## 2021-10-01 NOTE — Plan of Care (Signed)

## 2021-10-08 NOTE — Discharge Summary (Signed)
Physician Discharge Summary   Patient ID: BODE PIEPER MRN: [id number] DOB/AGE: August 27, 1955 66 y.o.  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  Admission Diagnoses:  Past Medical History:  Diagnosis Date   Acute meniscal tear of knee left   Aneurysm, ascending aorta ([place]) 4.8cm per ct 2011/ CARDIOLOGSIT--  DR Rollene Fare  LOV  10-07-2011  NOTE W/ CHART AND REQUEST LEXISCAN MYOVIEW AND ECHO TO BE FAXED.   ASYMPTOMATIC   Heart murmur MILD-- ASYMPTOMATIC   Hyperlipidemia    Hypertension    OA (osteoarthritis) of knee left   OSA (obstructive sleep apnea) NON-TOLERANT CPAP   no cpap PT REPORTS NEVER FINISHED TEST    Pre-diabetes    Sleep apnea    Tinnitus of both ears    Discharge Diagnoses:   Principal Problem:   S/P revision of total knee, right  Estimated body mass index is 38.71 kg/m as calculated from the following:   Height as of this encounter: '6\' 4"'$  (1.93 m).   Weight as of this encounter: 144.2 kg.  Procedure:  Procedure(s) (LRB): REIMPLANTATION / REVISON OF TOTAL KNEE (Right)   Consults: None  HPI:  The patient is a very pleasant 66 year old male with a history of right total knee arthroplasty within the past 2 years.  He was seen and evaluated on referral for persistent pain.  Workup revealed signs of infection.  He had  a resection of his infected total knee arthroplasty with placement of antibiotic spacer.  He was treated with IV antibiotics.  As we monitored his clinical course and laboratory studies, his infection appeared to be cleared with normalization of his  sedimentation rate and C-reactive protein.  He was scheduled for reimplantation.  Risks of recurrence of infection, DVT, component failure, stiffness, and need for future surgeries were discussed and reviewed.   Consent was obtained for management of his  right knee.  Laboratory Data: Admission on 09/30/2021, Discharged on 10/01/2021  Component Date Value Ref Range Status   CRP 09/30/2021 <0.5  <1.0 mg/dL Final   Performed at [facility], [place] [address], [place],  [address]   Glucose-Capillary 09/30/2021 128 (H)  70 - 99 mg/dL Final   Glucose reference range applies only to samples taken after fasting for at least 8 hours.   WBC 10/01/2021 12.1 (H)  4.0 - 10.5 K/uL Final   RBC 10/01/2021 3.36 (L)  4.22 - 5.81 MIL/uL Final   Hemoglobin 10/01/2021 10.7 (L)  13.0 - 17.0 g/dL Final   HCT 10/01/2021 32.1 (L)  39.0 - 52.0 % Final   MCV 10/01/2021 95.5  80.0 - 100.0 fL Final   MCH 10/01/2021 31.8  26.0 - 34.0 pg Final   MCHC 10/01/2021 33.3  30.0 - 36.0 g/dL Final   RDW 10/01/2021 13.6  11.5 - 15.5 % Final   Platelets 10/01/2021 141 (L)  150 - 400 K/uL Final   nRBC 10/01/2021 0.0  0.0 - 0.2 % Final   Performed at [facility], [place] [address] [place], [place] [address]   Sodium 10/01/2021 138  135 - 145 mmol/L Final   Potassium 10/01/2021 4.0  3.5 - 5.1 mmol/L Final   Chloride 10/01/2021 108  98 - 111 mmol/L Final   CO2 10/01/2021 22  22 - 32 mmol/L Final   Glucose, Bld 10/01/2021 182 (H)  70 -  99 mg/dL Final   Glucose reference range applies only to samples taken after fasting for at least 8 hours.   BUN 10/01/2021 21  8 - 23 mg/dL Final   Creatinine, Ser 10/01/2021 0.92  0.61 - 1.24 mg/dL Final   Calcium 10/01/2021 8.3 (L)  8.9 - 10.3 mg/dL Final   GFR, Estimated 10/01/2021 >60  >60 mL/min Final   Comment: (NOTE) Calculated using the CKD-EPI Creatinine Equation (2021)    Anion gap 10/01/2021 8  5 - 15 Final   Performed at [facility], [place] [address], [place], [place] [address]   CRP 10/01/2021 0.8  <1.0 mg/dL Final   Performed at [place] [address], [place], [place] [address]  Hospital Outpatient Visit on 09/24/2021   Component Date Value Ref Range Status   Glucose-Capillary 09/24/2021 113 (H)  70 - 99 mg/dL Final   Glucose reference range applies only to samples taken after fasting for at least 8 hours.   Sodium 09/24/2021 138  135 - 145 mmol/L Final   Potassium 09/24/2021 3.6  3.5 - 5.1 mmol/L Final   Chloride 09/24/2021 103  98 - 111 mmol/L Final   CO2 09/24/2021 26  22 - 32 mmol/L Final   Glucose, Bld 09/24/2021 106 (H)  70 - 99 mg/dL Final   Glucose reference range applies only to samples taken after fasting for at least 8 hours.   BUN 09/24/2021 21  8 - 23 mg/dL Final   Creatinine, Ser 09/24/2021 0.80  0.61 - 1.24 mg/dL Final   Calcium 09/24/2021 9.2  8.9 - 10.3 mg/dL Final   GFR, Estimated 09/24/2021 >60  >60 mL/min Final   Comment: (NOTE) Calculated using the CKD-EPI Creatinine Equation (2021)    Anion gap 09/24/2021 9  5 - 15 Final   Performed at [facility], [place] [address], [place], [place] [address]   WBC 09/24/2021 6.2  4.0 - 10.5 K/uL Final   RBC 09/24/2021 4.57  4.22 - 5.81 MIL/uL Final   Hemoglobin 09/24/2021 14.2  13.0 - 17.0 g/dL Final   HCT 09/24/2021 42.8  39.0 - 52.0 % Final   MCV 09/24/2021 93.7  80.0 - 100.0 fL Final   MCH 09/24/2021 31.1  26.0 - 34.0 pg Final   MCHC 09/24/2021 33.2  30.0 - 36.0 g/dL Final   RDW 09/24/2021 13.9  11.5 - 15.5 % Final   Platelets 09/24/2021 182  150 - 400 K/uL Final   nRBC 09/24/2021 0.0  0.0 - 0.2 % Final   Performed at [facility], [place] [address], [place], [place] [address]   ABO/RH(D) 09/24/2021 A POS   Final   Antibody Screen 09/24/2021 NEG   Final   Sample Expiration 09/24/2021 10/03/2021,2359   Final   Extend sample reason 09/24/2021    Final                   Value:NO TRANSFUSIONS OR PREGNANCY IN THE PAST 3 MONTHS Performed at [place] [address] [place], [place] [address]    Sed Rate 09/24/2021 10  0 - 16 mm/hr Final   Performed at [facility],  [place] [address], [place], [place] [address]   MRSA, PCR 09/24/2021 NEGATIVE  NEGATIVE Final   Staphylococcus aureus 09/24/2021 NEGATIVE  NEGATIVE Final   Comment: (NOTE) The Xpert SA Assay (FDA approved for NASAL specimens in patients 21 years of age and older), is one component of  a comprehensive surveillance program. It is not intended to diagnose infection nor to guide or monitor treatment. Performed at [facility], [place] [address], [place], [place] [address]   Office Visit on 09/01/2021  Component Date Value Ref Range Status   CRP 09/01/2021 2.1  <8.0 mg/L Final   Sed Rate 09/01/2021 9  0 - 20 mm/h Final     X-Rays:DG Knee Right Port  Result Date: 10/01/2021 CLINICAL DATA:  Postoperative status. EXAM: PORTABLE RIGHT KNEE - 1-2 VIEW COMPARISON:  None Available. FINDINGS: The femoral and tibial components are well situated. Expected postoperative changes are noted in the soft tissues anteriorly. IMPRESSION: Status post right total knee arthroplasty. Electronically Signed   By: Marijo Conception M.D.   On: 10/01/2021 08:30    EKG: Orders placed or performed in visit on 07/14/21   EKG 12-Lead     Hospital Course: PIETRO BONURA II is a 66 y.o. who was admitted to [facility]. They were brought to the operating room on 09/30/2021 and underwent Procedure(s): REIMPLANTATION / [place].  Patient tolerated the procedure well and was later transferred to the recovery room and then to the orthopaedic floor for postoperative care. They were given PO and IV analgesics for pain control following their surgery. They were given 24 hours of postoperative antibiotics of  Anti-infectives (From admission, onward)    Start     Dose/Rate Route Frequency Ordered Stop   10/01/21 0000  cefadroxil (DURICEF) 500 MG capsule        500 mg Oral 2 times daily 10/01/21 0803 11/12/21 2359   09/30/21 1800  ceFAZolin (ANCEF) IVPB 2g/100 mL premix        2 g 200 mL/hr  over 30 Minutes Intravenous Every 6 hours 09/30/21 1703 10/01/21 0042   09/30/21 1306  vancomycin (VANCOCIN) powder  Status:  Discontinued          As needed 09/30/21 1307 09/30/21 1648   09/30/21 0800  ceFAZolin (ANCEF) IVPB 3g/100 mL premix  Status:  Discontinued        3 g 200 mL/hr over 30 Minutes Intravenous On call to O.R. 09/30/21 5366 09/30/21 1648      and started on DVT prophylaxis in the form of Aspirin.   PT and OT were ordered for total joint protocol. Discharge planning consulted to help with postop disposition and equipment needs.  Patient had a good night on the evening of surgery. They started to get up OOB with therapy on POD #1. Pt was seen during rounds and was ready to go home pending progress with therapy.He worked with therapy on POD #1 and was meeting his goals. Pt was discharged to home later that day in stable condition.  Diet: Regular diet Activity: WBAT Follow-up: in 2 weeks Disposition: Home Discharged Condition: good   Discharge Instructions     Call MD / Call 911   Complete by: As directed    If you experience chest pain or shortness of breath, CALL 911 and be transported to the hospital emergency room.  If you develope a fever above 101 F, pus (white drainage) or increased drainage or redness at the wound, or calf pain, call your surgeon's office.   Change dressing   Complete by: As directed    Maintain surgical dressing until follow up in the clinic. If the edges start to pull up, may reinforce with tape. If the dressing is no longer working, may remove and cover  with gauze and tape, but must keep the area dry and clean.  Call with any questions or concerns.   Constipation Prevention   Complete by: As directed    Drink plenty of fluids.  Prune juice may be helpful.  You may use a stool softener, such as Colace (over the counter) 100 mg twice a day.  Use MiraLax (over the counter) for constipation as needed.   Diet - low sodium heart healthy   Complete by:  As directed    Increase activity slowly as tolerated   Complete by: As directed    Weight bearing as tolerated with assist device (walker, cane, etc) as directed, use it as long as suggested by your surgeon or therapist, typically at least 4-6 weeks.   Post-operative opioid taper instructions:   Complete by: As directed    POST-OPERATIVE OPIOID TAPER INSTRUCTIONS: It is important to wean off of your opioid medication as soon as possible. If you do not need pain medication after your surgery it is ok to stop day one. Opioids include: Codeine, Hydrocodone(Norco, Vicodin), Oxycodone(Percocet, oxycontin) and hydromorphone amongst others.  Long term and even short term use of opiods can cause: Increased pain response Dependence Constipation Depression Respiratory depression And more.  Withdrawal symptoms can include Flu like symptoms Nausea, vomiting And more Techniques to manage these symptoms Hydrate well Eat regular healthy meals Stay active Use relaxation techniques(deep breathing, meditating, yoga) Do Not substitute Alcohol to help with tapering If you have been on opioids for less than two weeks and do not have pain than it is ok to stop all together.  Plan to wean off of opioids This plan should start within one week post op of your joint replacement. Maintain the same interval or time between taking each dose and first decrease the dose.  Cut the total daily intake of opioids by one tablet each day Next start to increase the time between doses. The last dose that should be eliminated is the evening dose.      TED hose   Complete by: As directed    Use stockings (TED hose) for 2 weeks on both leg(s).  You may remove them at night for sleeping.      Allergies as of 10/01/2021       Reactions   Ciprofloxacin Other (See Comments)   Unknown   Penicillins Other (See Comments)   Childhood reaction- not recalled Has patient had a PCN reaction causing immediate rash,  facial/tongue/throat swelling, SOB or lightheadedness with hypotension:unsure Has patient had a PCN reaction causing severe rash involving mucus membranes or skin necrosis:unsure Has patient had a PCN reaction that required hospitalization:unsure Has patient had a PCN reaction occurring within the last 10 years:No If all of the above answers are "NO", then may proceed with Ceph.use. Tolerated ancef 07/22/21   Quinolones Other (See Comments)   Patient was warned about not using Cipro and similar antibiotics. Recent studies have raised concern that fluoroquinolone antibiotics could be associated with an increased risk of aortic aneurysm Fluoroquinolones have non-antimicrobial properties that might jeopardise the integrity of the extracellular matrix of the vascular wall In a  propensity score matched cohort study in [place], there was a 66% increased rate of aortic aneurysm or dissection associated with oral fluoroquinolone use, compared wit        Medication List     STOP taking these medications    diclofenac 75 MG EC tablet Commonly known as: VOLTAREN   traMADol 50 MG tablet  Commonly known as: ULTRAM       TAKE these medications    acetaminophen 500 MG tablet Commonly known as: TYLENOL Take 1,000 mg by mouth 2 (two) times daily.   amLODipine 10 MG tablet Commonly known as: NORVASC Take 10 mg by mouth daily.   aspirin 81 MG chewable tablet Chew 1 tablet (81 mg total) by mouth 2 (two) times daily for 28 days.   atorvastatin 40 MG tablet Commonly known as: LIPITOR TAKE 1 TABLET BY MOUTH EVERY DAY   cefadroxil 500 MG capsule Commonly known as: DURICEF Take 1 capsule (500 mg total) by mouth 2 (two) times daily.   cyanocobalamin 1000 MCG tablet Commonly known as: VITAMIN B12 Take 1,000 mcg by mouth daily.   docusate sodium 100 MG capsule Commonly known as: COLACE Take 1 capsule (100 mg total) by mouth 2 (two) times daily.   furosemide 20 MG tablet Commonly known as:  LASIX Take 40 mg by mouth in the morning.   lisinopril 40 MG tablet Commonly known as: ZESTRIL Take 40 mg by mouth daily.   meclizine 25 MG tablet Commonly known as: ANTIVERT Take 25 mg by mouth daily.   methocarbamol 500 MG tablet Commonly known as: ROBAXIN Take 1 tablet (500 mg total) by mouth every 6 (six) hours as needed for muscle spasms.   oxyCODONE 5 MG immediate release tablet Commonly known as: Oxy IR/ROXICODONE Take 1 tablet (5 mg total) by mouth every 4 (four) hours as needed for severe pain.   polyethylene glycol 17 g packet Commonly known as: MIRALAX / GLYCOLAX Take 17 g by mouth daily as needed for mild constipation.   pregabalin 75 MG capsule Commonly known as: LYRICA Take 75 mg by mouth 2 (two) times daily.   tamsulosin 0.4 MG Caps capsule Commonly known as: FLOMAX Take 0.4 mg by mouth daily.   traZODone 50 MG tablet Commonly known as: DESYREL Take 50 mg by mouth at bedtime as needed for sleep.   triamcinolone cream 0.1 % Commonly known as: KENALOG Apply 1 application  topically daily.   vitamin C 1000 MG tablet Take 1,000 mg by mouth daily.               Discharge Care Instructions  (From admission, onward)           Start     Ordered   10/01/21 0000  Change dressing       Comments: Maintain surgical dressing until follow up in the clinic. If the edges start to pull up, may reinforce with tape. If the dressing is no longer working, may remove and cover with gauze and tape, but must keep the area dry and clean.  Call with any questions or concerns.   10/01/21 0803            Follow-up Information     Paralee Cancel, MD. Schedule an appointment as soon as possible for a visit in 2 week(s).   Specialty: Orthopedic Surgery Contact information: [address] [place] [address] [phone]                 Signed: Griffith Citron, PA-C Orthopedic Surgery 10/08/2021, 8:23 AM

## 2021-12-10 ENCOUNTER — Encounter (HOSPITAL_COMMUNITY): Payer: Self-pay

## 2021-12-13 ENCOUNTER — Other Ambulatory Visit (HOSPITAL_COMMUNITY): Payer: BC Managed Care – PPO

## 2021-12-13 ENCOUNTER — Ambulatory Visit (HOSPITAL_COMMUNITY): Admission: RE | Admit: 2021-12-13 | Payer: Medicare Other | Source: Ambulatory Visit

## 2021-12-13 ENCOUNTER — Ambulatory Visit (HOSPITAL_COMMUNITY): Payer: Medicare Other

## 2021-12-13 ENCOUNTER — Other Ambulatory Visit: Payer: Self-pay

## 2021-12-13 DIAGNOSIS — I351 Nonrheumatic aortic (valve) insufficiency: Secondary | ICD-10-CM

## 2021-12-13 DIAGNOSIS — I712 Thoracic aortic aneurysm, without rupture, unspecified: Secondary | ICD-10-CM

## 2021-12-14 ENCOUNTER — Other Ambulatory Visit: Payer: Self-pay

## 2021-12-14 DIAGNOSIS — I10 Essential (primary) hypertension: Secondary | ICD-10-CM

## 2021-12-14 DIAGNOSIS — I712 Thoracic aortic aneurysm, without rupture, unspecified: Secondary | ICD-10-CM

## 2021-12-23 ENCOUNTER — Ambulatory Visit (INDEPENDENT_AMBULATORY_CARE_PROVIDER_SITE_OTHER): Payer: BC Managed Care – PPO | Admitting: Podiatry

## 2021-12-23 DIAGNOSIS — L84 Corns and callosities: Secondary | ICD-10-CM | POA: Diagnosis not present

## 2021-12-23 DIAGNOSIS — M79674 Pain in right toe(s): Secondary | ICD-10-CM

## 2021-12-23 DIAGNOSIS — B351 Tinea unguium: Secondary | ICD-10-CM | POA: Diagnosis not present

## 2021-12-23 DIAGNOSIS — M216X1 Other acquired deformities of right foot: Secondary | ICD-10-CM | POA: Diagnosis not present

## 2021-12-23 DIAGNOSIS — M79675 Pain in left toe(s): Secondary | ICD-10-CM

## 2021-12-23 DIAGNOSIS — M216X2 Other acquired deformities of left foot: Secondary | ICD-10-CM

## 2021-12-23 MED ORDER — EFINACONAZOLE 10 % EX SOLN: 1.0000 [drp] | Freq: Every day | CUTANEOUS | 11 refills | Status: DC

## 2021-12-23 MED ORDER — AMMONIUM LACTATE 12 % EX LOTN: 1.0000 | TOPICAL_LOTION | CUTANEOUS | 0 refills | Status: DC | PRN

## 2021-12-23 NOTE — Progress Notes (Signed)
Subjective:   Patient ID: Travis Myers, male   DOB: 66 y.o.   MRN: [id number]   HPI Chief Complaint  Patient presents with   Nail Problem    Routine foot care, nail trim and callus, nail fungus, toe are numb,    66 year old male presents the office for above concerns.  Has lymphedema and has a pump at home he uses some.  He also gets recurrent calluses pointing to submetatarsal 5 bilaterally.  He trims them down himself without causing pain.  He is asked for other treatment options for this.  He developed a small wound on the drainage clear fluid but no pus.  He has had multiple surgeries on right side for knee replacement.   Review of Systems  All other systems reviewed and are negative.  Past Medical History:  Diagnosis Date   Acute meniscal tear of knee left   Aneurysm, ascending aorta ([place]) 4.8cm per ct 2011/ CARDIOLOGSIT--  DR Rollene Fare  LOV  10-07-2011  NOTE W/ CHART AND REQUEST LEXISCAN MYOVIEW AND ECHO TO BE FAXED.   ASYMPTOMATIC   Heart murmur MILD-- ASYMPTOMATIC   Hyperlipidemia    Hypertension    OA (osteoarthritis) of knee left   OSA (obstructive sleep apnea) NON-TOLERANT CPAP   no cpap PT REPORTS NEVER FINISHED TEST    Pre-diabetes    Sleep apnea    Tinnitus of both ears     Past Surgical History:  Procedure Laterality Date   BILATERAL IINGUINAL HERNIA REPAIR  1963   DOPPLER ECHOCARDIOGRAPHY  10/18/2011   EF >55 %   ENDOVENOUS ABLATION SAPHENOUS VEIN W/ LASER Right 12/30/2020   endovenous laser ablation right greater saphenous vein by Gae Gallop MD   EXCISIONAL TOTAL KNEE ARTHROPLASTY WITH ANTIBIOTIC SPACERS Right 07/22/2021   Procedure: EXCISIONAL TOTAL KNEE ARTHROPLASTY WITH ANTIBIOTIC SPACERS;  Surgeon: Paralee Cancel, MD;  Location: [facility];  Service: Orthopedics;  Laterality: Right;   INCISION AND DRAINAGE OF WOUND Right 04/27/2021   Procedure: IRRIGATION AND DEBRIDEMENT OF HEMATOMA AND ABCESS ,ASPIRATE RIGHT KNEE;  Surgeon: Sydnee Cabal, MD;   Location: [facility];  Service: Orthopedics;  Laterality: Right;  REQUEST 3:30PM START TIME   LUMBAR LAMINECTOMY/DECOMPRESSION MICRODISCECTOMY N/A 02/24/2016   Procedure: Bilateral Decompression L4-5, Discectomy L4-5 ;  Surgeon: Susa Day, MD;  Location: [facility];  Service: Orthopedics;  Laterality: N/A;   NM MYOCAR PERF WALL MOTION  10/13/2011   EF 49 %,low risk study   REIMPLANTATION OF TOTAL KNEE Right 09/30/2021   Procedure: REIMPLANTATION / REVISON OF TOTAL KNEE;  Surgeon: Paralee Cancel, MD;  Location: [facility];  Service: Orthopedics;  Laterality: Right;  150   SHOULDER ARTHROSCOPY Left 2022   TONSILLECTOMY  1965   TORN MENISCUS X 3 IN KNEES SURGERY      LEFT    TOTAL HIP ARTHROPLASTY Left 06/18/2009   OA LEFT HIP   TOTAL HIP ARTHROPLASTY Right 03/11/2014   Procedure: RIGHT TOTAL HIP ARTHROPLASTY ANTERIOR APPROACH;  Surgeon: [name], MD;  Location: [facility];  Service: Orthopedics;  Laterality: Right;   TOTAL KNEE ARTHROPLASTY Right 02/26/2020   Procedure: TOTAL KNEE ARTHROPLASTY;  Surgeon: Sydnee Cabal, MD;  Location: [facility];  Service: Orthopedics;  Laterality: Right;  adductor canal   TRANSTHORACIC ECHOCARDIOGRAM  09/11/2012   EF 55 to 60 %,mild aortic valve regurg,mild mitral valve regurg,lf and rt atriums mildly dilated   VARICOSE VEIN SURGERY Left 1980   and 1994     Current Outpatient Medications:  ammonium lactate (AMLACTIN) 12 % lotion, Apply 1 Application topically as needed for dry skin., Disp: 400 g, Rfl: 0   Efinaconazole 10 % SOLN, Apply 1 drop topically daily., Disp: 4 mL, Rfl: 11   acetaminophen (TYLENOL) 500 MG tablet, Take 1,000 mg by mouth 2 (two) times daily., Disp: , Rfl:    amLODipine (NORVASC) 10 MG tablet, Take 10 mg by mouth daily., Disp: , Rfl:    Ascorbic Acid (VITAMIN C) 1000 MG tablet, Take 1,000 mg by mouth daily., Disp: , Rfl:    atorvastatin (LIPITOR) 40 MG tablet, TAKE 1 TABLET BY MOUTH EVERY DAY, Disp: 90 tablet, Rfl: 3   cyanocobalamin  (VITAMIN B12) 1000 MCG tablet, Take 1,000 mcg by mouth daily., Disp: , Rfl:    docusate sodium (COLACE) 100 MG capsule, Take 1 capsule (100 mg total) by mouth 2 (two) times daily. (Patient not taking: Reported on 09/23/2021), Disp: 10 capsule, Rfl: 0   furosemide (LASIX) 20 MG tablet, Take 40 mg by mouth in the morning., Disp: , Rfl: 5   lisinopril (PRINIVIL,ZESTRIL) 40 MG tablet, Take 40 mg by mouth daily., Disp: , Rfl: 5   meclizine (ANTIVERT) 25 MG tablet, Take 25 mg by mouth daily., Disp: , Rfl: 0   methocarbamol (ROBAXIN) 500 MG tablet, Take 1 tablet (500 mg total) by mouth every 6 (six) hours as needed for muscle spasms., Disp: 40 tablet, Rfl: 2   oxyCODONE (OXY IR/ROXICODONE) 5 MG immediate release tablet, Take 1 tablet (5 mg total) by mouth every 4 (four) hours as needed for severe pain., Disp: 42 tablet, Rfl: 0   polyethylene glycol (MIRALAX / GLYCOLAX) 17 g packet, Take 17 g by mouth daily as needed for mild constipation. (Patient not taking: Reported on 09/23/2021), Disp: 14 each, Rfl: 0   pregabalin (LYRICA) 75 MG capsule, Take 75 mg by mouth 2 (two) times daily., Disp: , Rfl:    tamsulosin (FLOMAX) 0.4 MG CAPS capsule, Take 0.4 mg by mouth daily., Disp: , Rfl:    traZODone (DESYREL) 50 MG tablet, Take 50 mg by mouth at bedtime as needed for sleep., Disp: , Rfl:    triamcinolone (KENALOG) 0.1 %, Apply 1 application  topically daily., Disp: , Rfl:   Allergies  Allergen Reactions   Ciprofloxacin Other (See Comments)    Unknown   Penicillins Other (See Comments)    Childhood reaction- not recalled Has patient had a PCN reaction causing immediate rash, facial/tongue/throat swelling, SOB or lightheadedness with hypotension:unsure Has patient had a PCN reaction causing severe rash involving mucus membranes or skin necrosis:unsure Has patient had a PCN reaction that required hospitalization:unsure Has patient had a PCN reaction occurring within the last 10 years:No If all of the above  answers are "NO", then may proceed with Ceph.use. Tolerated ancef 07/22/21     Quinolones Other (See Comments)    Patient was warned about not using Cipro and similar antibiotics. Recent studies have raised concern that fluoroquinolone antibiotics could be associated with an increased risk of aortic aneurysm Fluoroquinolones have non-antimicrobial properties that might jeopardise the integrity of the extracellular matrix of the vascular wall In a  propensity score matched cohort study in [place], there was a 66% increased rate of aortic aneurysm or dissection associated with oral fluoroquinolone use, compared wit         Objective:  Physical Exam  General: AAO x3, NAD  Dermatological: Nails are hypertrophic, dystrophic, brittle, discolored, elongated 10. No surrounding redness or drainage. Tenderness nails 1-5 bilaterally.  Hyperkeratotic lesions bilateral submetatarsal 5 without any underlying ulceration drainage or signs of infection.  On the anterior aspect of the right leg is superficial abrasion type lesion.  There is clear drainage there is no pus.  No surrounding erythema, ascending cellulitis.  Vascular: Dorsalis Pedis artery and Posterior Tibial artery pedal pulses are 2/4 bilateral with immedate capillary fill time.  Chronic lymphedema present.  No pain with calf compression today.  Neruologic: Grossly intact via light touch bilateral.  Sensation appears to be intact with Thornell Mule monofilament  Musculoskeletal: Prominent metatarsal heads plantarly.  Muscular strength 5/5 in all groups tested bilateral.  Gait: Unassisted, Nonantalgic.       Assessment:   66 year old male with symptomatic onychomycosis, hyperkeratotic lesions, lymphedema     Plan:  -Treatment options discussed including all alternatives, risks, and complications -Etiology of symptoms were discussed -Nails debrided 10 without complications or bleeding.  [place]. -Should be debrided hyperkeratotic  lesions x 2 without any complications or bleeding.  Discussed different treatment options.  Continue moisturizer, offloading.  We discussed custom orthotics to help offload the fifth metatarsal heads. -Antibiotic ointment and a bandage on the wound on the right leg.  Also compression.  Monitoring signs or symptoms of infection.  If this does not heal or worsening would refer to the wound care center. -Daily foot inspection -Follow-up in 3 months or sooner if any problems arise. In the meantime, encouraged to call the office with any questions, concerns, change in symptoms.   Celesta Gentile, DPM

## 2021-12-27 ENCOUNTER — Telehealth: Payer: Self-pay | Admitting: Podiatry

## 2021-12-27 NOTE — Telephone Encounter (Signed)
Left message on voicemail for patient to call back to schedule appt to be measured for orthotics

## 2021-12-27 NOTE — Telephone Encounter (Signed)
-----   Message from Trula Slade, DPM sent at 12/25/2021 10:52 AM EST ----- Did not get measured for orthotics on Thursday when he was here?

## 2022-01-11 ENCOUNTER — Ambulatory Visit (HOSPITAL_COMMUNITY): Admission: RE | Admit: 2022-01-11 | Payer: Medicare Other | Source: Ambulatory Visit

## 2022-01-12 ENCOUNTER — Other Ambulatory Visit (HOSPITAL_COMMUNITY): Payer: BC Managed Care – PPO

## 2022-02-03 ENCOUNTER — Ambulatory Visit (INDEPENDENT_AMBULATORY_CARE_PROVIDER_SITE_OTHER): Payer: Medicare Other

## 2022-02-03 DIAGNOSIS — M216X1 Other acquired deformities of right foot: Secondary | ICD-10-CM

## 2022-02-03 DIAGNOSIS — M216X2 Other acquired deformities of left foot: Secondary | ICD-10-CM

## 2022-02-03 NOTE — Progress Notes (Signed)
Patient presents today to pick up custom molded foot orthotics, diagnosed with prominent metatarsal head of left and right foot  by Dr. Jacqualyn Posey.   Orthotics were dispensed and fit was satisfactory. Reviewed instructions for break-in and wear. Written instructions given to patient.  Patient will follow up as needed.   [facility] - order # [id number]

## 2022-02-07 ENCOUNTER — Ambulatory Visit (HOSPITAL_COMMUNITY): Payer: Medicare Other | Attending: Cardiovascular Disease

## 2022-02-07 DIAGNOSIS — I350 Nonrheumatic aortic (valve) stenosis: Secondary | ICD-10-CM

## 2022-02-07 DIAGNOSIS — I351 Nonrheumatic aortic (valve) insufficiency: Secondary | ICD-10-CM | POA: Insufficient documentation

## 2022-02-07 DIAGNOSIS — I712 Thoracic aortic aneurysm, without rupture, unspecified: Secondary | ICD-10-CM | POA: Insufficient documentation

## 2022-02-07 LAB — ECHOCARDIOGRAM COMPLETE
AR max vel: 1.64 cm2
AV Area VTI: 1.68 cm2
AV Area mean vel: 1.82 cm2
AV Mean grad: 12 mmHg
AV Peak grad: 21.5 mmHg
Ao pk vel: 2.32 m/s
Area-P 1/2: 2.62 cm2
P 1/2 time: 841 msec
S' Lateral: 3.7 cm

## 2022-02-08 ENCOUNTER — Other Ambulatory Visit: Payer: Self-pay

## 2022-02-08 DIAGNOSIS — I10 Essential (primary) hypertension: Secondary | ICD-10-CM

## 2022-02-09 ENCOUNTER — Telehealth: Payer: Self-pay | Admitting: Cardiovascular Disease

## 2022-02-09 ENCOUNTER — Encounter (HOSPITAL_COMMUNITY): Payer: Self-pay

## 2022-02-09 ENCOUNTER — Ambulatory Visit (HOSPITAL_COMMUNITY)
Admission: RE | Admit: 2022-02-09 | Discharge: 2022-02-09 | Disposition: A | Discharge status: Home / Self Care | Payer: Medicare Other | Source: Ambulatory Visit | Attending: Physician Assistant | Admitting: Physician Assistant

## 2022-02-09 DIAGNOSIS — Z01818 Encounter for other preprocedural examination: Secondary | ICD-10-CM

## 2022-02-09 DIAGNOSIS — I351 Nonrheumatic aortic (valve) insufficiency: Secondary | ICD-10-CM

## 2022-02-09 DIAGNOSIS — I712 Thoracic aortic aneurysm, without rupture, unspecified: Secondary | ICD-10-CM

## 2022-02-09 DIAGNOSIS — I10 Essential (primary) hypertension: Secondary | ICD-10-CM

## 2022-02-09 NOTE — Telephone Encounter (Signed)
Spoke with CT tech, Janell regarding pt's experience today with CT scan. Pt was almost ready to scan with IV and all preparations done, however when pt had to lay down flat and be moved into the donut he immediately became uncomfortable and asked to be pulled out.   Spoke with pt regarding his experience. Pt states that he is unsure of why he is having trouble with this now, pt states that since his knee surgery he has been sleeping sitting up and with weight gain he is unable to lay flat for very long. Pt states that the older he gets the worst his claustrophobia gets. Pt states that he tried to go in a second time but was unable to make it through the scan. Pt would like to see if Dr. Gwenlyn Found with prescribe him a medication that will help him get through the scan. Will route to provider to advise. Pt verbalizes understanding.

## 2022-02-09 NOTE — Telephone Encounter (Signed)
Patient came for CT but was unable to do the CT because of of claustrophobia. Please advise

## 2022-02-18 NOTE — Telephone Encounter (Signed)
Spoke with pt regarding CT chest/aorta. Dr. Gwenlyn Found is ok with sending in medication to help him be able to lay flat. Will reach out to scheduling to get pt re-scheduled and call in medication. Pt verbalizes understanding.

## 2022-02-18 NOTE — Telephone Encounter (Addendum)
Travis Harp, MD  You2 weeks ago    OK to give ativan or xanex    Medication Xanax 55m one tablet called into pharmacy for pt per Dr. BGwenlyn Found

## 2022-02-22 NOTE — Telephone Encounter (Signed)
Called pt with new time and date for chest/aorta CTA. 2/28 at 12:00pm at [facility]. Let pt know that prescription for Xanax 44m has been sent in to his pharmacy. Advised pt that he would need to have someone drive him while taking this medication. Advised pt that he would take medication 1 hour prior to scan. Pt states that he would prefer either an appointment later in the afternoon or on Friday. Will reach back out to scheduling. Pt verbalizes understanding.

## 2022-02-23 ENCOUNTER — Ambulatory Visit (INDEPENDENT_AMBULATORY_CARE_PROVIDER_SITE_OTHER): Payer: Medicare Other | Admitting: Podiatry

## 2022-02-23 ENCOUNTER — Encounter: Payer: Self-pay | Admitting: Podiatry

## 2022-02-23 DIAGNOSIS — M79675 Pain in left toe(s): Secondary | ICD-10-CM

## 2022-02-23 DIAGNOSIS — M216X1 Other acquired deformities of right foot: Secondary | ICD-10-CM

## 2022-02-23 DIAGNOSIS — B351 Tinea unguium: Secondary | ICD-10-CM

## 2022-02-23 DIAGNOSIS — M79674 Pain in right toe(s): Secondary | ICD-10-CM

## 2022-02-23 DIAGNOSIS — M779 Enthesopathy, unspecified: Secondary | ICD-10-CM

## 2022-02-23 NOTE — Progress Notes (Signed)
This patient presents to the office with chief complaint of pain undr the outside ball of both feet.  He says these areas are painful walking and wearing his shoes.  He has been treated with debridement of callus and orthotic therapy.  He says the pain is severe and presents for callus care.  He also says the nails on his right foot are thick and painful.  He presents to the office for evaluation and treatment.  Vascular  Dorsalis pedis and posterior tibial pulses are palpable  B/L.  Capillary return  WNL.  Temperature gradient is  WNL.  Skin turgor  WNL Lymphedema  B/L.  Sensorium  Senn Weinstein monofilament wire  WNL. Normal tactile sensation.  Nail Exam  Patient has normal nails with no evidence of bacterial or fungal infectionleft foot.  Patient has thick nails 1-5 right foot.  Orthopedic  Exam  Muscle tone and muscle strength  WNL.  No limitations of motion feet  B/L.  No crepitus or joint effusion noted.  Foot type is unremarkable and digits show no abnormalities.  Bony prominences are unremarkable.  Plantar flexed fifth met  B/L.  Skin  No open lesions.  Normal skin texture and turgor. Callus sub 5th  B/L.  Capsulitis  5th  B/L.  Plantar flexed fifth met  B/L.  Onychomycosis  ROV.  Discussed this condition with this patient.  Checked his orthoses and saw they were not helping his condition.  Checked with pedorthist.  Discussed possible surgery  fifth  B/L.  Debride nails right foot with nail nipper and dremel tool.  RTC  Pedorthist.  RTC as needed.  Padding applied to his shoes.    Gardiner Barefoot DPM

## 2022-02-25 ENCOUNTER — Ambulatory Visit (INDEPENDENT_AMBULATORY_CARE_PROVIDER_SITE_OTHER): Payer: Medicare Other

## 2022-02-25 DIAGNOSIS — M216X1 Other acquired deformities of right foot: Secondary | ICD-10-CM

## 2022-02-25 NOTE — Progress Notes (Signed)
Patient presents to the office today with issues concerning their custom inserts. Patient states that his orthotics are too thick.  Will send orthotics to lab for adjustments to make them sulcus length.   Advised patient that she will receive a call from the office to come in for a fitting.

## 2022-02-25 NOTE — Telephone Encounter (Signed)
Spoke with pt regarding changing date of his chest CTA. Pt has been rescheduled for Friday, March 1st at 12pm. Instructed pt to take xanax 43m one hour prior to scan and make sure he has someone to drive him. Pt will have his wife drive him. Pt verbalizes understanding.

## 2022-03-09 ENCOUNTER — Ambulatory Visit (HOSPITAL_COMMUNITY): Payer: Medicare Other

## 2022-03-09 ENCOUNTER — Telehealth: Payer: Self-pay | Admitting: Podiatry

## 2022-03-09 NOTE — Telephone Encounter (Signed)
Lmom that refinished orthotics are back .  Please call to set up appt

## 2022-03-11 ENCOUNTER — Ambulatory Visit (HOSPITAL_COMMUNITY)
Admission: RE | Admit: 2022-03-11 | Discharge: 2022-03-11 | Disposition: A | Discharge status: Home / Self Care | Payer: Medicare Other | Source: Ambulatory Visit | Attending: Physician Assistant | Admitting: Physician Assistant

## 2022-03-11 DIAGNOSIS — I10 Essential (primary) hypertension: Secondary | ICD-10-CM | POA: Insufficient documentation

## 2022-03-11 DIAGNOSIS — I712 Thoracic aortic aneurysm, without rupture, unspecified: Secondary | ICD-10-CM | POA: Insufficient documentation

## 2022-03-11 DIAGNOSIS — I351 Nonrheumatic aortic (valve) insufficiency: Secondary | ICD-10-CM | POA: Diagnosis present

## 2022-03-11 DIAGNOSIS — Z01818 Encounter for other preprocedural examination: Secondary | ICD-10-CM | POA: Diagnosis present

## 2022-03-11 LAB — POCT I-STAT CREATININE: Creatinine, Ser: 0.9 mg/dL (ref 0.61–1.24)

## 2022-03-11 MED ORDER — IOHEXOL 350 MG/ML SOLN
100.0000 mL | Freq: Once | INTRAVENOUS | Status: AC | PRN
  Administered 2022-03-11: 100 mL via INTRAVENOUS

## 2022-03-16 ENCOUNTER — Telehealth: Payer: Self-pay

## 2022-03-16 NOTE — Telephone Encounter (Signed)
Warren Lacy, PA-C 03/15/2022  9:09 PM EST     Stable aneurysmal disease of the ascending thoracic aorta with maximum diameter of approximately 4.8 cm. Ascending thoracic aortic aneurysm. -- Patient is due to see Dr. Gwenlyn Found - please assist in scheduling.   Results discussed with pt and office visit scheduled. Pt verbalizes understanding.

## 2022-03-16 NOTE — Telephone Encounter (Addendum)
Called patient regarding results. Patient had understanding of results.----- Message from Warren Lacy, PA-C sent at 03/15/2022  9:06 PM EST ----- Normal kidney function

## 2022-03-16 NOTE — Telephone Encounter (Signed)
Called patient regarding results . Patient had understanding of results. Patient scheduled with Dr. Gwenlyn Found on 03/30/22

## 2022-03-16 NOTE — Telephone Encounter (Signed)
-----   Message from Warren Lacy, PA-C sent at 03/15/2022  9:09 PM EST ----- Stable aneurysmal disease of the ascending thoracic aorta with maximum diameter of approximately 4.8 cm. Ascending thoracic aortic aneurysm. -- Patient is due to see Dr. Gwenlyn Found - please assist in scheduling.

## 2022-03-30 ENCOUNTER — Encounter: Payer: Self-pay | Admitting: Cardiovascular Disease

## 2022-03-30 ENCOUNTER — Ambulatory Visit: Payer: Medicare Other | Attending: Cardiovascular Disease | Admitting: Cardiovascular Disease

## 2022-03-30 VITALS — BP 126/60 | HR 74 | Ht 76.0 in | Wt 326.0 lb

## 2022-03-30 DIAGNOSIS — E782 Mixed hyperlipidemia: Secondary | ICD-10-CM | POA: Insufficient documentation

## 2022-03-30 DIAGNOSIS — I10 Essential (primary) hypertension: Secondary | ICD-10-CM | POA: Diagnosis present

## 2022-03-30 DIAGNOSIS — I351 Nonrheumatic aortic (valve) insufficiency: Secondary | ICD-10-CM | POA: Insufficient documentation

## 2022-03-30 DIAGNOSIS — I7121 Aneurysm of the ascending aorta, without rupture: Secondary | ICD-10-CM | POA: Diagnosis present

## 2022-03-30 NOTE — Assessment & Plan Note (Signed)
History of mild aortic regurgitation by 2D echo 02/07/2022 with normal LV systolic function.  He had mild aortic stenosis as well.  This will be repeated on an annual basis.

## 2022-03-30 NOTE — Patient Instructions (Signed)
Medication Instructions:  Your physician recommends that you continue on your current medications as directed. Please refer to the Current Medication list given to you today.  *If you need a refill on your cardiac medications before your next appointment, please call your pharmacy*   Testing/Procedures: Your physician has requested that you have an echocardiogram. Echocardiography is a painless test that uses sound waves to create images of your heart. It provides your doctor with information about the size and shape of your heart and how well your heart's chambers and valves are working. This procedure takes approximately one hour. There are no restrictions for this procedure. Please do NOT wear cologne, perfume, aftershave, or lotions (deodorant is allowed). Please arrive 15 minutes prior to your appointment time. To be done in January 2025.   Non-Cardiac CT Angiography (CTA), is a special type of CT scan that uses a computer to produce multi-dimensional views of major blood vessels throughout the body. In CT angiography, a contrast material is injected through an IV to help visualize the blood vessels To do in March 2025.   Follow-Up: At [facility], you and your health needs are our priority.  As part of our continuing mission to provide you with exceptional heart care, we have created designated Provider Care Teams.  These Care Teams include your primary Cardiologist (physician) and Advanced Practice Providers (APPs -  Physician Assistants and Nurse Practitioners) who all work together to provide you with the care you need, when you need it.  We recommend signing up for the patient portal called "MyChart".  Sign up information is provided on this After Visit Summary.  MyChart is used to connect with patients for Virtual Visits (Telemedicine).  Patients are able to view lab/test results, encounter notes, upcoming appointments, etc.  Non-urgent messages can be sent to your provider as  well.   To learn more about what you can do with MyChart, go to [email or web address].    Your next appointment:   12 month(s)  Provider:   Quay Burow, MD

## 2022-03-30 NOTE — Assessment & Plan Note (Signed)
History of hyperlipidemia on statin therapy with lipid profile performed 11/29/2021 revealing total cholesterol 158, LDL of 83 and HDL 61.

## 2022-03-30 NOTE — Assessment & Plan Note (Signed)
History of moderate size ascending thoracic aortic aneurysm measuring 48 mm by CTA performed 03/11/2022.  This will be repeated on an annual basis.  I am going to establish him with Dr. Arvid Right for ongoing surveillance.  He did see Dr. Servando Snare in the past.

## 2022-03-30 NOTE — Assessment & Plan Note (Signed)
History of essential hypertension blood pressure measured today 126/60.  He is on amlodipine and lisinopril.

## 2022-03-30 NOTE — Progress Notes (Signed)
03/30/2022 Travis Myers   03-24-1955  [id number]  Primary Physician Travis Bender, PA-C Primary Cardiologist: Travis Harp MD Travis Myers, [place]  HPI:  Travis Myers is a 67 y.o.    moderately overweight married Caucasian male with no children who works in the Transport planner at [employer]. He was previously a patient of Dr. Georgiann Myers His primary care provider is Travis Myers he PA-C at [facility]. I last saw him in the office 06/17/2020. He has a history of treated hypertension and hyperlipidemia. There is no family history. He has never had a heart attack or stroke. He denies chest pain or shortness of breath. He does have a thoracic aortic aneurysm measuring 51 mm by CT angiogram performed in September last year. This is followed on an annual basis. He had a negative Myoview stress test performed in February of this year done for preoperative clearance before a right total hip replacement done via the anterior approach by Dr. Alvan Myers . He is recuperated from this nicely. He had a right meniscal surgery by Dr. Theda Myers as well.    He does have a moderate sized ascending thoracic aortic aneurysm which we have been following by CTA.  Recently it measured 49 mm. Dr. Servando Myers is following this as well every 6 months.  Is asymptomatic from this.  He denies chest pain or shortness of breath.  Recent 2D echo performed 07/03/2018 revealed normal LV systolic function with mild to moderate aortic insufficiency.   Since I saw him in the office 2 years ago he continues to do well.  Recent chest CTA showed his ascending thoracic aorta stable at 40 mm.  2D echo revealed only mild AI and AS.  He has had issues with his left knee and had to have replacement of his hardware because of staph infection.  He has gained 50 pounds since his first knee replacement.  He also has significant lymphedema and has seen Dr. Gae Myers  for this.   Current Meds  Medication Sig    acetaminophen (TYLENOL) 500 MG tablet Take 1,000 mg by mouth 2 (two) times daily.   amLODipine (NORVASC) 10 MG tablet Take 10 mg by mouth daily.   ammonium lactate (AMLACTIN) 12 % lotion Apply 1 Application topically as needed for dry skin.   Ascorbic Acid (VITAMIN C) 1000 MG tablet Take 1,000 mg by mouth daily.   atorvastatin (LIPITOR) 40 MG tablet TAKE 1 TABLET BY MOUTH EVERY DAY   cyanocobalamin (VITAMIN B12) 1000 MCG tablet Take 1,000 mcg by mouth daily.   Efinaconazole 10 % SOLN Apply 1 drop topically daily.   furosemide (LASIX) 20 MG tablet Take 40 mg by mouth in the morning.   lisinopril (PRINIVIL,ZESTRIL) 40 MG tablet Take 40 mg by mouth daily.   meclizine (ANTIVERT) 25 MG tablet Take 25 mg by mouth daily.   methocarbamol (ROBAXIN) 500 MG tablet Take 1 tablet (500 mg total) by mouth every 6 (six) hours as needed for muscle spasms.   pregabalin (LYRICA) 75 MG capsule Take 75 mg by mouth 2 (two) times daily.   traMADol (ULTRAM) 50 MG tablet Take 50 mg by mouth every 6 (six) hours as needed for severe pain.   traZODone (DESYREL) 50 MG tablet Take 50 mg by mouth at bedtime as needed for sleep.   triamcinolone (KENALOG) 0.1 % Apply 1 application  topically daily.     Allergies  Allergen Reactions   Ciprofloxacin Other (  See Comments)    Unknown   Penicillins Other (See Comments)    Childhood reaction- not recalled Has patient had a PCN reaction causing immediate rash, facial/tongue/throat swelling, SOB or lightheadedness with hypotension:unsure Has patient had a PCN reaction causing severe rash involving mucus membranes or skin necrosis:unsure Has patient had a PCN reaction that required hospitalization:unsure Has patient had a PCN reaction occurring within the last 10 years:No If all of the above answers are "NO", then may proceed with Ceph.use. Tolerated ancef 07/22/21     Quinolones Other (See Comments)    Patient was warned about not using Cipro and similar antibiotics. Recent  studies have raised concern that fluoroquinolone antibiotics could be associated with an increased risk of aortic aneurysm Fluoroquinolones have non-antimicrobial properties that might jeopardise the integrity of the extracellular matrix of the vascular wall In a  propensity score matched cohort study in [place], there was a 66% increased rate of aortic aneurysm or dissection associated with oral fluoroquinolone use, compared wit    Social History   Socioeconomic History   Marital status: Married    Spouse name: Not on file   Number of children: Not on file   Years of education: Not on file   Highest education level: Not on file  Occupational History   Not on file  Tobacco Use   Smoking status: Never   Smokeless tobacco: Former    Quit date: 09/11/1988  Vaping Use   Vaping Use: Never used  Substance and Sexual Activity   Alcohol use: Yes    Alcohol/week: 1.0 standard drink of alcohol    Types: 1 Standard drinks or equivalent per week    Comment: RARE   Drug use: No   Sexual activity: Yes  Other Topics Concern   Not on file  Social History Narrative   Not on file   Social Determinants of Health   Financial Resource Strain: Not on file  Food Insecurity: No Food Insecurity (09/30/2021)   Hunger Vital Sign    Worried About Running Out of Food in the Last Year: Never true    Ran Out of Food in the Last Year: Never true  Transportation Needs: No Transportation Needs (09/30/2021)   PRAPARE - Hydrologist (Medical): No    Lack of Transportation (Non-Medical): No  Physical Activity: Not on file  Stress: Not on file  Social Connections: Not on file  Intimate Partner Violence: Not At Risk (09/30/2021)   Humiliation, Afraid, Rape, and Kick questionnaire    Fear of Current or Ex-Partner: No    Emotionally Abused: No    Physically Abused: No    Sexually Abused: No     Review of Systems: General: negative for chills, fever, night sweats or weight  changes.  Cardiovascular: negative for chest pain, dyspnea on exertion, edema, orthopnea, palpitations, paroxysmal nocturnal dyspnea or shortness of breath Dermatological: negative for rash Respiratory: negative for cough or wheezing Urologic: negative for hematuria Abdominal: negative for nausea, vomiting, diarrhea, bright red blood per rectum, melena, or hematemesis Neurologic: negative for visual changes, syncope, or dizziness All other systems reviewed and are otherwise negative except as noted above.    Blood pressure 126/60, pulse 74, height 6\' 4"  (1.93 m), weight (!) 326 lb (147.9 kg).  General appearance: alert and no distress Neck: no adenopathy, no carotid bruit, no JVD, supple, symmetrical, trachea midline, and thyroid not enlarged, symmetric, no tenderness/mass/nodules Lungs: clear to auscultation bilaterally Heart: regular rate and rhythm, S1,  S2 normal, no murmur, click, rub or Myers Extremities: extremities normal, atraumatic, no cyanosis or edema Pulses: 2+ and symmetric Skin: Skin color, texture, turgor normal. No rashes or lesions Neurologic: Grossly normal  EKG sinus rhythm at 74 with PACs.  I personally reviewed this EKG.  ASSESSMENT AND PLAN:   Thoracic aortic aneurysm (HCC) History of moderate size ascending thoracic aortic aneurysm measuring 48 mm by CTA performed 03/11/2022.  This will be repeated on an annual basis.  I am going to establish him with Dr. Arvid Right for ongoing surveillance.  He did see Dr. Servando Myers in the past.  Essential hypertension History of essential hypertension blood pressure measured today 126/60.  He is on amlodipine and lisinopril.  Hyperlipidemia History of hyperlipidemia on statin therapy with lipid profile performed 11/29/2021 revealing total cholesterol 158, LDL of 83 and HDL 61.  Aortic regurgitation History of mild aortic regurgitation by 2D echo 02/07/2022 with normal LV systolic function.  He had mild aortic stenosis as  well.  This will be repeated on an annual basis.     Travis Harp MD FACP,FACC,FAHA, [facility] 03/30/2022 2:46 PM

## 2022-04-20 ENCOUNTER — Other Ambulatory Visit: Payer: Medicare Other

## 2022-04-21 ENCOUNTER — Telehealth: Payer: Self-pay | Admitting: Podiatry

## 2022-04-21 NOTE — Telephone Encounter (Signed)
Patient called he states that the orthotics that he picked up yesterday are ruined and are of no use to him, when they sent back to the vendor they cut them up and his toes hang off the end of the orthotics .  He wants a call back to know how we are going to fix this issue or refund his $490.00, please call him today.

## 2022-04-27 ENCOUNTER — Institutional Professional Consult (permissible substitution) (INDEPENDENT_AMBULATORY_CARE_PROVIDER_SITE_OTHER): Payer: Medicare Other | Admitting: Surgery

## 2022-04-27 ENCOUNTER — Encounter: Payer: Self-pay | Admitting: Surgery

## 2022-04-27 VITALS — BP 133/63 | HR 60 | Resp 20 | Ht 76.0 in | Wt 310.0 lb

## 2022-04-27 DIAGNOSIS — I7121 Aneurysm of the ascending aorta, without rupture: Secondary | ICD-10-CM | POA: Diagnosis not present

## 2022-04-27 NOTE — Progress Notes (Signed)
HPI:  The patient is a 67 year old gentleman who has been followed by Dr. Tyrone Sage since 2016 for an ascending aortic aneurysm with a trileaflet aortic valve.  This was first diagnosed in 2011 after he had a CT scan of the chest to follow-up on abnormal preop chest x-ray prior to hip replacement.  That CT scan showed a 4.8 cm fusiform ascending aortic aneurysm as well as multiple small pulmonary nodules that have remained unchanged over the years.  He last saw Dr. Tyrone Sage in September 2021 and CTA of the chest at that time showed the ascending aortic aneurysm to measure 4.5 cm.  He had a follow-up scan done in September 2022 and it again measured 4.8 cm.  He has had a difficult history over the past few years with a right total knee replacement complicated by infections requiring resection arthroplasty and placement of an articulating antibiotic spacer.  He has subsequently developed fairly severe bilateral lower extremity lymphedema that causes chronic pain and swelling and makes ambulation difficult.  He has not been able to exercise or walk much and has gained weight.  He denies any chest pain or pressure.  Current Outpatient Medications  Medication Sig Dispense Refill   acetaminophen (TYLENOL) 500 MG tablet Take 1,000 mg by mouth 2 (two) times daily.     amLODipine (NORVASC) 10 MG tablet Take 10 mg by mouth daily.     ammonium lactate (AMLACTIN) 12 % lotion Apply 1 Application topically as needed for dry skin. 400 g 0   Ascorbic Acid (VITAMIN C) 1000 MG tablet Take 1,000 mg by mouth daily.     atorvastatin (LIPITOR) 40 MG tablet TAKE 1 TABLET BY MOUTH EVERY DAY 90 tablet 3   cyanocobalamin (VITAMIN B12) 1000 MCG tablet Take 1,000 mcg by mouth daily.     Efinaconazole 10 % SOLN Apply 1 drop topically daily. 4 mL 11   furosemide (LASIX) 20 MG tablet Take 40 mg by mouth in the morning.  5   lisinopril (PRINIVIL,ZESTRIL) 40 MG tablet Take 40 mg by mouth daily.  5   meclizine (ANTIVERT) 25 MG  tablet Take 25 mg by mouth daily.  0   methocarbamol (ROBAXIN) 500 MG tablet Take 1 tablet (500 mg total) by mouth every 6 (six) hours as needed for muscle spasms. 40 tablet 2   pregabalin (LYRICA) 75 MG capsule Take 75 mg by mouth 2 (two) times daily.     traMADol (ULTRAM) 50 MG tablet Take 50 mg by mouth every 6 (six) hours as needed for severe pain.     traZODone (DESYREL) 50 MG tablet Take 50 mg by mouth at bedtime as needed for sleep.     triamcinolone (KENALOG) 0.1 % Apply 1 application  topically daily.     No current facility-administered medications for this visit.     Physical Exam: BP 133/63   Pulse 60   Resp 20   Ht 6\' 4"  (1.93 m)   Wt (!) 310 lb (140.6 kg)   SpO2 96% Comment: RA  BMI 37.73 kg/m  He looks well. Cardiac exam shows a regular rate and rhythm with normal heart sounds.  There is no murmur. Lungs are clear. There is bilateral lower extremity swelling worse on the right than the left.  Diagnostic Tests:  Narrative & Impression  CLINICAL DATA:  Follow-up aneurysmal disease of the ascending thoracic aorta.   EXAM: CT ANGIOGRAPHY CHEST WITH CONTRAST   TECHNIQUE: Multidetector CT imaging of the chest was performed  using the standard protocol during bolus administration of intravenous contrast. Multiplanar CT image reconstructions and MIPs were obtained to evaluate the vascular anatomy.   RADIATION DOSE REDUCTION: This exam was performed according to the departmental dose-optimization program which includes automated exposure control, adjustment of the mA and/or kV according to patient size and/or use of iterative reconstruction technique.   CONTRAST:  OMNIPAQUE IOHEXOL 350 MG/ML SOLN   COMPARISON:  09/17/2020 as well as additional prior studies.   FINDINGS: Cardiovascular: The aortic root is normal in caliber measures approximately 3.5-3.6 cm at the level of the sinuses of Valsalva. The aortic valve again demonstrates thickening and  focal calcification. The ascending thoracic aorta demonstrates stable aneurysmal disease with maximum diameter of approximately 4.8 cm. The proximal arch measures 2.9 cm and the distal arch 2.7 cm. The descending thoracic aorta measures 2.7 cm. No evidence of aortic dissection. Visualized proximal great vessels demonstrate normal patency and normal branching anatomy.   The heart size is stable and within normal limits. Diffuse calcified coronary artery plaque again noted. No pericardial fluid identified. Central pulmonary arteries are normal in caliber.   Mediastinum/Nodes: No enlarged mediastinal, hilar, or axillary lymph nodes. Thyroid gland, trachea, and esophagus demonstrate no significant findings.   Lungs/Pleura: There is no evidence of pulmonary edema, consolidation, pneumothorax, nodule or pleural fluid.   Upper Abdomen: Stable evidence of hepatic steatosis.   Musculoskeletal: No chest wall abnormality. No acute or significant osseous findings.   Review of the MIP images confirms the above findings.   IMPRESSION: 1. Stable aneurysmal disease of the ascending thoracic aorta with maximum diameter of approximately 4.8 cm. Ascending thoracic aortic aneurysm. Recommend semi-annual imaging followup by CTA or MRA and referral to cardiothoracic surgery if not already obtained. This recommendation follows 2010 ACCF/AHA/AATS/ACR/ASA/SCA/SCAI/SIR/STS/SVM Guidelines for the Diagnosis and Management of Patients With Thoracic Aortic Disease. Circulation. 2010; 121: N562-Z308. Aortic aneurysm NOS (ICD10-I71.9) 2. Stable thickening and calcification of the aortic valve. 3. Coronary atherosclerosis. 4. Hepatic steatosis.   Aortic aneurysm NOS (ICD10-I71.9).     Electronically Signed   By: Irish Lack M.D.   On: 03/11/2022 13:13    Impression:  He has a stable 4.8 cm fusiform ascending aortic aneurysm which is unchanged dating back to 2011.  His echocardiogram in December  2022 showed mild to moderate aortic insufficiency.  There is mild aortic stenosis with a mean gradient of 12 mmHg and a peak gradient of 21 mmHg.  Left ventricular ejection fraction was 60 to 65%.  A follow-up echo in January 2024 showed mild aortic insufficiency.  The mean gradient across the aortic valve is unchanged at 12 mmHg.  Left ventricular ejection fraction was unchanged.  His aortic aneurysm is still below the surgical threshold of 5.5 cm.  I would expect him to have a larger aorta given his height of 6 foot 4 and 310 pounds.  His descending aorta at the same level measures about 3 cm.  I reviewed the CTA images with him and answered all of his questions.  I stressed the importance of continued good blood pressure control in preventing further enlargement and acute aortic dissection.  I advised him against doing any heavy lifting that may require a Valsalva maneuver and could suddenly raise his blood pressure to high levels.  Plan:  He will return in 1 year with a CTA of the chest.  I spent 20 minutes performing this established patient evaluation and > 50% of this time was spent face to face counseling  and coordinating the care of this patient's aortic aneurysm.   Alleen Borne, MD [facility] [phone]

## 2022-04-27 NOTE — Telephone Encounter (Signed)
Patient called very upset that he is not getting a response back from anyone, he wants someone to call him today about his orthotics being ruined .  He wants to know how this is to be corrected.

## 2022-04-28 NOTE — Telephone Encounter (Signed)
Patient called again today , he does not want to wait on a response from [facility], he would like [employer] officer, political party to call him it has been 5 weeks since he has been dealing with this issue.  His orthotics are ruined he can't wear them, he does not want to come in and be rescanned to get a pair that fits, he just wants his money back.

## 2022-05-10 NOTE — Telephone Encounter (Signed)
Call was transferred from the call center, today 05/10/22 at 1130 am. Patient was extremely frustrated with how his orthotics were made and they are un wearable. I asked the patient would he like to come in to be re-scanned to make orthotic right, the patient did decline. I apologized to the patient for the inconvenience and communicated with him that I would reach out to our billing department to start the process on generating the refund. I informed the patient that I would give him a call on Monday 05/16/22 with the update on the refund process.

## 2022-05-13 ENCOUNTER — Telehealth: Payer: Self-pay

## 2022-05-13 DIAGNOSIS — I890 Lymphedema, not elsewhere classified: Secondary | ICD-10-CM

## 2022-05-13 DIAGNOSIS — I872 Venous insufficiency (chronic) (peripheral): Secondary | ICD-10-CM

## 2022-05-13 NOTE — Telephone Encounter (Signed)
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 Edilia Bo  Next Appt: Appointment scheduled for 06/16/22 at 10:30

## 2022-05-31 ENCOUNTER — Other Ambulatory Visit: Payer: Self-pay | Admitting: Physician Assistant

## 2022-05-31 DIAGNOSIS — M7989 Other specified soft tissue disorders: Secondary | ICD-10-CM

## 2022-06-16 ENCOUNTER — Ambulatory Visit (HOSPITAL_COMMUNITY)
Admission: RE | Admit: 2022-06-16 | Discharge: 2022-06-16 | Disposition: A | Discharge status: Home / Self Care | Payer: Medicare Other | Source: Ambulatory Visit | Attending: Vascular Surgery | Admitting: Vascular Surgery

## 2022-06-16 ENCOUNTER — Ambulatory Visit (INDEPENDENT_AMBULATORY_CARE_PROVIDER_SITE_OTHER): Payer: Medicare Other | Admitting: Vascular Surgery

## 2022-06-16 ENCOUNTER — Encounter: Payer: Self-pay | Admitting: Vascular Surgery

## 2022-06-16 VITALS — BP 148/79 | HR 60 | Temp 98.2°F | Resp 20 | Ht 76.0 in | Wt 324.0 lb

## 2022-06-16 DIAGNOSIS — I872 Venous insufficiency (chronic) (peripheral): Secondary | ICD-10-CM | POA: Diagnosis not present

## 2022-06-16 DIAGNOSIS — I890 Lymphedema, not elsewhere classified: Secondary | ICD-10-CM | POA: Diagnosis not present

## 2022-06-16 NOTE — Progress Notes (Signed)
REASON FOR VISIT:   FOLLOW-UP OF VENOUS INSUFFICIENCY:  MEDICAL ISSUES:   COMBINED CHRONIC VENOUS INSUFFICIENCY AND LYMPHEDEMA: This patient has combined chronic venous insufficiency and lymphedema.  He is undergone previous ablation of the right great saphenous vein in 2022 but this vein has reopened.  I think the main issue is he has profound deep venous reflux and I think he is at high risk for the same problem if we tried to ablate this again.  I think the best way to address this would be high ligation of the right great saphenous vein and stripping.  This would have to be done in the operating room.  I will think there is much different to do on the left given the duplex findings.  We have again discussed the importance of leg elevation.  I explained that it is not can to be effective if he cannot get his legs higher than his heart which is a problem for him.  He gets vertigo.  Likewise when using the lymphedema pump he should try to elevate his legs above his heart.  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  I will plan on seeing him back in early September.  He knows to call sooner if he has problems.   HPI:   Travis Myers is a pleasant 67 y.o. male who I last saw on 05/21/2021.  He has combined chronic venous insufficiency and lymphedema.  On 12/30/2020 he underwent laser ablation of the right great saphenous vein to the proximal calf.  His symptoms improved significantly after that.  When I saw him last he was still having significant swelling from combined chronic venous insufficiency and lymphedema.  Duplex scan showed that the proximal saphenous vein on the right was open but the below that the vein was successfully ablated.  I did not think this was contributing to his swelling.  Since I saw the patient last he presents with worsening pain in both lower  extremities.  He describes aching pain and heaviness when he is on his feet.  He uses his pneumatic compression device 3 times a week for an hour.  He states that sometimes this makes his legs feel worse the next day.  I think his main problem is is that he has vertigo and is unable to lay flat.  Thus when he is elevating his legs or using his lymphedema pump he is still working against gravity.  He does try to exercise some.  Past Medical History:  Diagnosis Date   Acute meniscal tear of knee left   Aneurysm, ascending aorta (HCC) 4.8cm per ct 2011/ CARDIOLOGSIT--  DR [name]  LOV  10-07-2011  NOTE W/ CHART AND REQUEST LEXISCAN MYOVIEW AND ECHO TO BE FAXED.   ASYMPTOMATIC   Heart murmur MILD-- ASYMPTOMATIC   Hyperlipidemia    Hypertension    OA (osteoarthritis) of knee left   OSA (obstructive sleep apnea) NON-TOLERANT CPAP   no cpap PT REPORTS NEVER FINISHED TEST    Pre-diabetes    Sleep apnea    Tinnitus of both ears     Family History  Problem Relation Age of Onset   Diabetes Mother    Cancer Mother    Heart failure Other    Diabetes Other    Cancer Other    COPD Other  Colon cancer Neg Hx    Stomach cancer Neg Hx     SOCIAL HISTORY: Social History   Tobacco Use   Smoking status: Never   Smokeless tobacco: Former    Quit date: 09/11/1988  Substance Use Topics   Alcohol use: Yes    Alcohol/week: 1.0 standard drink of alcohol    Types: 1 Standard drinks or equivalent per week    Comment: RARE    Allergies  Allergen Reactions   Ciprofloxacin Other (See Comments)    Unknown   Penicillins Other (See Comments)    Childhood reaction- not recalled Has patient had a PCN reaction causing immediate rash, facial/tongue/throat swelling, SOB or lightheadedness with hypotension:unsure Has patient had a PCN reaction causing severe rash involving mucus membranes or skin necrosis:unsure Has patient had a PCN reaction that required hospitalization:unsure Has patient had a PCN  reaction occurring within the last 10 years:No If all of the above answers are "NO", then may proceed with Ceph.use. Tolerated ancef 07/22/21     Quinolones Other (See Comments)    Patient was warned about not using Cipro and similar antibiotics. Recent studies have raised concern that fluoroquinolone antibiotics could be associated with an increased risk of aortic aneurysm Fluoroquinolones have non-antimicrobial properties that might jeopardise the integrity of the extracellular matrix of the vascular wall In a  propensity score matched cohort study in [place], there was a 66% increased rate of aortic aneurysm or dissection associated with oral fluoroquinolone use, compared wit    Current Outpatient Medications  Medication Sig Dispense Refill   acetaminophen (TYLENOL) 500 MG tablet Take 1,000 mg by mouth 2 (two) times daily.     amLODipine (NORVASC) 10 MG tablet Take 10 mg by mouth daily.     ammonium lactate (AMLACTIN) 12 % lotion Apply 1 Application topically as needed for dry skin. 400 g 0   Ascorbic Acid (VITAMIN C) 1000 MG tablet Take 1,000 mg by mouth daily.     atorvastatin (LIPITOR) 40 MG tablet TAKE 1 TABLET BY MOUTH EVERY DAY 90 tablet 3   cyanocobalamin (VITAMIN B12) 1000 MCG tablet Take 1,000 mcg by mouth daily.     Efinaconazole 10 % SOLN Apply 1 drop topically daily. 4 mL 11   furosemide (LASIX) 20 MG tablet Take 40 mg by mouth in the morning.  5   lisinopril (PRINIVIL,ZESTRIL) 40 MG tablet Take 40 mg by mouth daily.  5   meclizine (ANTIVERT) 25 MG tablet Take 25 mg by mouth daily.  0   methocarbamol (ROBAXIN) 500 MG tablet Take 1 tablet (500 mg total) by mouth every 6 (six) hours as needed for muscle spasms. 40 tablet 2   pregabalin (LYRICA) 75 MG capsule Take 75 mg by mouth 2 (two) times daily.     traMADol (ULTRAM) 50 MG tablet Take 50 mg by mouth every 6 (six) hours as needed for severe pain.     traZODone (DESYREL) 50 MG tablet Take 50 mg by mouth at bedtime as needed for  sleep.     triamcinolone (KENALOG) 0.1 % Apply 1 application  topically daily.     No current facility-administered medications for this visit.    REVIEW OF SYSTEMS:  [X]  denotes positive finding, [ ]  denotes negative finding Cardiac  Comments:  Chest pain or chest pressure:    Shortness of breath upon exertion:    Short of breath when lying flat:    Irregular heart rhythm:        Vascular  Pain in calf, thigh, or hip brought on by ambulation:    Pain in feet at night that wakes you up from your sleep:     Blood clot in your veins:    Leg swelling:  x       Pulmonary    Oxygen at home:    Productive cough:     Wheezing:         Neurologic    Sudden weakness in arms or legs:     Sudden numbness in arms or legs:     Sudden onset of difficulty speaking or slurred speech:    Temporary loss of vision in one eye:     Problems with dizziness:         Gastrointestinal    Blood in stool:     Vomited blood:         Genitourinary    Burning when urinating:     Blood in urine:        Psychiatric    Major depression:         Hematologic    Bleeding problems:    Problems with blood clotting too easily:        Skin    Rashes or ulcers:        Constitutional    Fever or chills:     PHYSICAL EXAM:   There were no vitals filed for this visit.  GENERAL: The patient is a well-nourished male, in no acute distress. The vital signs are documented above. CARDIAC: There is a regular rate and rhythm.  VASCULAR: He has a biphasic dorsalis pedis and posterior tibial signal bilaterally. He has significant bilateral lower extremity swelling and hyperpigmentation bilaterally. I looked at his right great saphenous vein myself with the SonoSite.  The vein has reopened and is open back down to the proximal calf.  It measures 7 to 8 mm in diameter. He has previously had some type of a stripping procedure on the left but does have some segments of proximal great saphenous vein on the left  although the vein is very tortuous.  Maximum diameter in the thigh is 4.6 mm.   PULMONARY: There is good air exchange bilaterally without wheezing or rales. ABDOMEN: Soft and non-tender with normal pitched bowel sounds.  MUSCULOSKELETAL: There are no major deformities or cyanosis. NEUROLOGIC: No focal weakness or paresthesias are detected. SKIN: There are no ulcers or rashes noted. PSYCHIATRIC: The patient has a normal affect.  DATA:    VENOUS DUPLEX: I have independently interpreted his venous duplex scan today.  On the right side there is no evidence of DVT.  There is deep venous reflux in the common femoral vein, femoral vein, and popliteal vein.  The right great saphenous vein has recanalized with reflux down to the proximal calf.  Diameters of the vein ranged from 7 to 8 mm.  There is no significant reflux in the small saphenous vein.  The results of the study are summarized in the diagram below.    On the left side, there is no evidence of DVT.  There is deep venous reflux in the common femoral vein, femoral vein, and popliteal vein.  There is some superficial venous reflux in the great saphenous vein on the left in the proximal and mid thigh.  The vein however is tortuous and branches.  Maximum diameter is 4.6 mm.  Results of the study are summarized in the diagram below.  Waverly [facility]  Office [phone]

## 2022-06-21 ENCOUNTER — Ambulatory Visit (INDEPENDENT_AMBULATORY_CARE_PROVIDER_SITE_OTHER): Payer: Medicare Other | Admitting: Podiatry

## 2022-06-21 DIAGNOSIS — S90219A Contusion of unspecified great toe with damage to nail, initial encounter: Secondary | ICD-10-CM | POA: Diagnosis not present

## 2022-06-21 DIAGNOSIS — B351 Tinea unguium: Secondary | ICD-10-CM | POA: Diagnosis not present

## 2022-06-21 DIAGNOSIS — M79674 Pain in right toe(s): Secondary | ICD-10-CM

## 2022-06-21 DIAGNOSIS — R601 Generalized edema: Secondary | ICD-10-CM

## 2022-06-21 DIAGNOSIS — I872 Venous insufficiency (chronic) (peripheral): Secondary | ICD-10-CM

## 2022-06-21 DIAGNOSIS — M79675 Pain in left toe(s): Secondary | ICD-10-CM

## 2022-06-21 MED ORDER — DOXYCYCLINE HYCLATE 100 MG PO CAPS: 100.0000 mg | ORAL_CAPSULE | Freq: Two times a day (BID) | ORAL | 0 refills | Status: AC

## 2022-06-21 NOTE — Progress Notes (Signed)
Chief Complaint  Patient presents with   Nail Problem    Rm 11  RFC bilateral nail trim. Pt states there is some nail trauma to his right great toe and nail detachment to the right 3rd.     HPI: 67 y.o. male presenting today with concern of a sore toenail on the right great toe.  He notes that approximately 1 week ago he was able to drain some fluid from underneath the nail by pushing on the nail.  He states that the fluid had a foul odor and was very concerned regarding possible infection.  That is what prompted him to make this appointment.  He also would like to see what treatment options are available to improve the appearance and thickness of the toenails.  He notes that the right third toenail is loose.  He did not try to rip the nail away since it is attached near the base.  Denies drainage.  Patient suffers from significant venous insufficiency.  He does have compression stockings that he wears daily to try to keep this managed.  He notes that he underwent a right total knee replacement in the past which subsequently became infected and needed 2 additional revisions.  He is also requesting nail care today.  Past Medical History:  Diagnosis Date   Acute meniscal tear of knee left   Aneurysm, ascending aorta (HCC) 4.8cm per ct 2011/ CARDIOLOGSIT--  DR [name]  LOV  10-07-2011  NOTE W/ CHART AND REQUEST LEXISCAN MYOVIEW AND ECHO TO BE FAXED.   ASYMPTOMATIC   Heart murmur MILD-- ASYMPTOMATIC   Hyperlipidemia    Hypertension    OA (osteoarthritis) of knee left   OSA (obstructive sleep apnea) NON-TOLERANT CPAP   no cpap PT REPORTS NEVER FINISHED TEST    Pre-diabetes    Sleep apnea    Tinnitus of both ears     Past Surgical History:  Procedure Laterality Date   BILATERAL IINGUINAL HERNIA REPAIR  1963   DOPPLER ECHOCARDIOGRAPHY  10/18/2011   EF >55 %   ENDOVENOUS ABLATION SAPHENOUS VEIN W/ LASER Right 12/30/2020   endovenous laser ablation right greater saphenous  vein by Cari Caraway MD   EXCISIONAL TOTAL KNEE ARTHROPLASTY WITH ANTIBIOTIC SPACERS Right 07/22/2021   Procedure: EXCISIONAL TOTAL KNEE ARTHROPLASTY WITH ANTIBIOTIC SPACERS;  Surgeon: Durene Romans, MD;  Location: [facility];  Service: Orthopedics;  Laterality: Right;   INCISION AND DRAINAGE OF WOUND Right 04/27/2021   Procedure: IRRIGATION AND DEBRIDEMENT OF HEMATOMA AND ABCESS ,ASPIRATE RIGHT KNEE;  Surgeon: Eugenia Mcalpine, MD;  Location: [facility];  Service: Orthopedics;  Laterality: Right;  REQUEST 3:30PM START TIME   LUMBAR LAMINECTOMY/DECOMPRESSION MICRODISCECTOMY N/A 02/24/2016   Procedure: Bilateral Decompression L4-5, Discectomy L4-5 ;  Surgeon: Jene Every, MD;  Location: [facility];  Service: Orthopedics;  Laterality: N/A;   NM MYOCAR PERF WALL MOTION  10/13/2011   EF 49 %,low risk study   REIMPLANTATION OF TOTAL KNEE Right 09/30/2021   Procedure: REIMPLANTATION / REVISON OF TOTAL KNEE;  Surgeon: Durene Romans, MD;  Location: [facility];  Service: Orthopedics;  Laterality: Right;  150   SHOULDER ARTHROSCOPY Left 2022   TONSILLECTOMY  1965   TORN MENISCUS X 3 IN KNEES SURGERY      LEFT    TOTAL HIP ARTHROPLASTY Left 06/18/2009   OA LEFT HIP   TOTAL HIP ARTHROPLASTY Right 03/11/2014   Procedure: RIGHT TOTAL HIP ARTHROPLASTY ANTERIOR APPROACH;  Surgeon: Shelda Pal, MD;  Location:  [facility];  Service: Orthopedics;  Laterality: Right;   TOTAL KNEE ARTHROPLASTY Right 02/26/2020   Procedure: TOTAL KNEE ARTHROPLASTY;  Surgeon: Eugenia Mcalpine, MD;  Location: [facility];  Service: Orthopedics;  Laterality: Right;  adductor canal   TRANSTHORACIC ECHOCARDIOGRAM  09/11/2012   EF 55 to 60 %,mild aortic valve regurg,mild mitral valve regurg,lf and rt atriums mildly dilated   VARICOSE VEIN SURGERY Left 1980   and 1994    Allergies  Allergen Reactions   Ciprofloxacin Other (See Comments)    Unknown   Penicillins Other (See Comments)    Childhood reaction- not recalled Has patient had a PCN reaction  causing immediate rash, facial/tongue/throat swelling, SOB or lightheadedness with hypotension:unsure Has patient had a PCN reaction causing severe rash involving mucus membranes or skin necrosis:unsure Has patient had a PCN reaction that required hospitalization:unsure Has patient had a PCN reaction occurring within the last 10 years:No If all of the above answers are "NO", then may proceed with Ceph.use. Tolerated ancef 07/22/21     Quinolones Other (See Comments)    Patient was warned about not using Cipro and similar antibiotics. Recent studies have raised concern that fluoroquinolone antibiotics could be associated with an increased risk of aortic aneurysm Fluoroquinolones have non-antimicrobial properties that might jeopardise the integrity of the extracellular matrix of the vascular wall In a  propensity score matched cohort study in [place], there was a 66% increased rate of aortic aneurysm or dissection associated with oral fluoroquinolone use, compared wit    Review of Systems  Cardiovascular:  Positive for leg swelling.  Skin:        Hemosiderin in legs.  Thickened toenails with some distal lysis of nails.     Physical Exam:  General: The patient is alert and oriented x3 in no acute distress.  Dermatology: Skin is intact to bilateral lower extremities. Interspaces are clear of maceration and debris.  There is hemosiderin deposition marking both middle and distal one third aspect of the legs.  No blister formation is noted in this area.  There is a subungual hematoma on the right hallux.  This is visible from the hyponychium area.  No active drainage is seen on exam.  The distal portion of the nail is lytic.  No surrounding erythema.  The right third toenail is lytic to the level of the eponychium.  The nail base is minimally macerated but stable overall.  All nails are thickened, elongated, with some discoloration.  Vascular: Palpable pedal pulses bilaterally. Capillary refill  within normal limits.  +2 pitting edema to the legs and ankles bilateral.  Neurological: Light touch sensation grossly intact bilateral feet.   Musculoskeletal Exam: No pedal deformities noted  Assessment/Plan of Care: 1. Nail fungus   2. Contusion of great toe with damage to nail, initial encounter   3. Venous (peripheral) insufficiency   4. Generalized edema     Meds ordered this encounter  Medications   doxycycline (VIBRAMYCIN) 100 MG capsule    Sig: Take 1 capsule (100 mg total) by mouth 2 (two) times daily for 7 days.    Dispense:  14 capsule    Refill:  0     Discussed clinical findings with patient today.  With the patient's verbal consent, and after sterile skin prep, an 18-gauge needle was inserted into the hyponychium area where the subungual hematoma could be visualized beneath the right hallux nail.  Attempt was made to draw out the fluid into a syringe but this was not able  to be completed.  Therefore the needle was utilized to create a slightly larger opening and pressure was placed onto the toenail to express serous/bloody fluid.  No foul odor was noted.  No purulence was noted.  The nail was trimmed back carefully approximately 25% towards the eponychium.  The nail clippings were sent to [facility]  laboratories for fungal culture  Due to his history of right knee joint replacement infection and need for revision, will place the patient on prescription Keflex for prophylactic measures following the evacuation of subungual hematoma today.  He was instructed on performing Epsom salt soaks daily for the next few days and keeping covered with light gauze bandage or Band-Aid.  May discontinue this after 5 to 7 days  The right third toenail was gently cut back all the way to the eponychium uneventfully.  No further treatment is needed to this toe.  He was informed that the nail will eventually grow back.  All nails were debrided utilizing sterile nail nippers.  Patient noted that he  was not satisfied with several of the nails appearing as if there were sharp corners.  He noted that he prefers the corners cut back and rounded off.  This is not a pedicure service and he can go to a nail salon anytime to have this performed to his specifications.  A couple of the nails were cut back due to his chronic persistence during the exam today even though considerable time was already spent addressing the other issues and concerns today.    Will contact patient once we receive the culture report to discuss findings and initiate appropriate treatment.  Clerance Lav, DPM, FACFAS [facility]     [address] [place], [place] [address]                Office [phone]  Fax [phone]

## 2022-06-23 ENCOUNTER — Other Ambulatory Visit: Payer: Self-pay

## 2022-06-23 DIAGNOSIS — I83811 Varicose veins of right lower extremities with pain: Secondary | ICD-10-CM

## 2022-06-23 DIAGNOSIS — I890 Lymphedema, not elsewhere classified: Secondary | ICD-10-CM

## 2022-06-23 DIAGNOSIS — I872 Venous insufficiency (chronic) (peripheral): Secondary | ICD-10-CM

## 2022-07-12 ENCOUNTER — Telehealth: Payer: Self-pay | Admitting: Cardiovascular Disease

## 2022-07-12 ENCOUNTER — Telehealth: Payer: Self-pay | Admitting: *Deleted

## 2022-07-12 ENCOUNTER — Telehealth: Payer: Self-pay

## 2022-07-12 NOTE — Telephone Encounter (Signed)
Aug 13 he is having left knee surgery and the office will be singing the clearance letter in the mail. Wanted to make our office aware of it. Please advise

## 2022-07-12 NOTE — Telephone Encounter (Signed)
Pt has been scheduled for tele pre op appt 08/05/22 @ 10 am. Med rec and consent are done.     Patient Consent for Virtual Visit        Travis Myers has provided verbal consent on 07/12/2022 for a virtual visit (video or telephone).   CONSENT FOR VIRTUAL VISIT FOR:  Travis Myers  By participating in this virtual visit I agree to the following:  I hereby voluntarily request, consent and authorize [facility] and its employed or contracted physicians, physician assistants, nurse practitioners or other licensed health care professionals (the Practitioner), to provide me with telemedicine health care services (the "Services") as deemed necessary by the treating Practitioner. I acknowledge and consent to receive the Services by the Practitioner via telemedicine. I understand that the telemedicine visit will involve communicating with the Practitioner through live audiovisual communication technology and the disclosure of certain medical information by electronic transmission. I acknowledge that I have been given the opportunity to request an in-person assessment or other available alternative prior to the telemedicine visit and am voluntarily participating in the telemedicine visit.  I understand that I have the right to withhold or withdraw my consent to the use of telemedicine in the course of my care at any time, without affecting my right to future care or treatment, and that the Practitioner or I may terminate the telemedicine visit at any time. I understand that I have the right to inspect all information obtained and/or recorded in the course of the telemedicine visit and may receive copies of available information for a reasonable fee.  I understand that some of the potential risks of receiving the Services via telemedicine include:  Delay or interruption in medical evaluation due to technological equipment failure or disruption; Information transmitted may not be  sufficient (e.g. poor resolution of images) to allow for appropriate medical decision making by the Practitioner; and/or  In rare instances, security protocols could fail, causing a breach of personal health information.  Furthermore, I acknowledge that it is my responsibility to provide information about my medical history, conditions and care that is complete and accurate to the best of my ability. I acknowledge that Practitioner's advice, recommendations, and/or decision may be based on factors not within their control, such as incomplete or inaccurate data provided by me or distortions of diagnostic images or specimens that may result from electronic transmissions. I understand that the practice of medicine is not an exact science and that Practitioner makes no warranties or guarantees regarding treatment outcomes. I acknowledge that a copy of this consent can be made available to me via my patient portal [facility] MyChart), or I can request a printed copy by calling the office of  [facility].    I understand that my insurance will be billed for this visit.   I have read or had this consent read to me. I understand the contents of this consent, which adequately explains the benefits and risks of the Services being provided via telemedicine.  I have been provided ample opportunity to ask questions regarding this consent and the Services and have had my questions answered to my satisfaction. I give my informed consent for the services to be provided through the use of telemedicine in my medical care

## 2022-07-12 NOTE — Telephone Encounter (Signed)
   Pre-operative Risk Assessment    Patient Name: Travis Myers  DOB: 1955-10-19 MRN: [id number]      Request for Surgical Clearance    Procedure:   LEFT TOTAL KNEE ARTHROPLASTY  Date of Surgery:  Clearance 08/23/22                                 Surgeon:  DR. MATTHEW OLIN Surgeon's Group or Practice Name:  [facility] Phone number:  [phone]  ATTN: Rosalva Ferron Fax number:  [phone]   Type of Clearance Requested:   - Medical ; NO MEDICATIONS REQUESTED TO BE HELD   Type of Anesthesia:  Spinal   Additional requests/questions:    Elpidio Anis   07/12/2022, 11:22 AM

## 2022-07-12 NOTE — Telephone Encounter (Signed)
Left voicemail message that we will forward this message to the MD and nurse.

## 2022-07-12 NOTE — Telephone Encounter (Addendum)
   Name: Travis Myers  DOB: [birth year]  MRN: [id number]  Primary Cardiologist: Nanetta Batty, MD   Preoperative team, please contact this patient and set up a phone call appointment for further preoperative risk assessment. Please obtain consent and complete medication review. Thank you for your help.  I confirm that guidance regarding antiplatelet and oral anticoagulation therapy has been completed and, if necessary, noted below.  Regarding ASA therapy, it may be stopped 5-7 days prior to surgery with a plan to resume it as soon as felt to be feasible from a surgical standpoint in the post-operative period.    Napoleon Form, Leodis Rains, NP 07/12/2022, 11:52 AM [facility]

## 2022-07-12 NOTE — Telephone Encounter (Addendum)
Per Dr. Edilia Bo, recommend waiting 6-8 weeks for TKA after ligation and stripping surgery. Patient stated he cannot wait that long. He requested to cancel surgery and will proceed with his TKA on 8/13 and will contact our office back to reschedule after checking with orthopedic surgeon. MD made aware.

## 2022-07-12 NOTE — Telephone Encounter (Signed)
Late entry, when I s/w the pt earlier today he tells me that he is taking ASA 81 mg daily. I informed the pt that we did not have that listed in his med list. Pt was wondering if Dr. Allyson Sabal might say he can d/c ASA all together. I stated that I can ask Dr. Allyson Sabal, though in the meantime I will put ASA 81 mg back on his med list. I will update the pre op APP as well.

## 2022-07-12 NOTE — Telephone Encounter (Signed)
Patient is scheduled for ligation and stripping of right GSV on 07/29/22. He called to inquire how soon after this procedure, would he be OK to proceed with scheduling his left knee replacement?  Will forward to Dr. Edilia Bo to advise.

## 2022-07-12 NOTE — Telephone Encounter (Signed)
Pt has been scheduled for tele pre op appt 08/05/22 @ 10 am. Med rec and consent are done.

## 2022-07-13 ENCOUNTER — Telehealth: Payer: Self-pay

## 2022-07-13 NOTE — Telephone Encounter (Signed)
Spoke to pt regarding scheduling his ligation and vein stripping. He has not checked with his orthopedic surgeon regarding how long he should wait after his TKA to schedule his vein surgery. He will call ortho and then f/u with [place] after he speaks to ortho.

## 2022-07-15 NOTE — Addendum Note (Signed)
Addended by: Primitivo Gauze on: 07/15/2022 10:12 AM   Modules accepted: Orders

## 2022-07-15 NOTE — Telephone Encounter (Addendum)
Spoke with patient who reports orthopedic surgeon advised to wait 6 weeks before proceeding with ligation and vein stripping surgery. Patient aware Dr. Edilia Bo will not be available. Surgery rescheduled for 10/1 with Dr. Randie Heinz. Instructions reviewed and patient verbalized understanding.

## 2022-07-29 ENCOUNTER — Ambulatory Visit (HOSPITAL_COMMUNITY): Admission: RE | Admit: 2022-07-29 | Payer: Medicare Other | Source: Home / Self Care | Admitting: Vascular Surgery

## 2022-07-29 ENCOUNTER — Encounter (HOSPITAL_COMMUNITY): Admission: RE | Payer: Self-pay | Source: Home / Self Care

## 2022-07-29 SURGERY — LIGATION AND STRIPPING, VARICOSE VEIN
Anesthesia: Choice | Laterality: Right

## 2022-08-03 NOTE — Progress Notes (Signed)
Sent message, via epic in basket, requesting orders in epic from surgeon.  

## 2022-08-04 NOTE — Progress Notes (Signed)
Virtual Visit via Telephone Note   Because of Travis Myers's co-morbid illnesses, he is at least at moderate risk for complications without adequate follow up.  This format is felt to be most appropriate for this patient at this time.  The patient did not have access to video technology/had technical difficulties with video requiring transitioning to audio format only (telephone).  All issues noted in this document were discussed and addressed.  No physical exam could be performed with this format.  Please refer to the patient's chart for his consent to telehealth for [facility].  Evaluation Performed:  Preoperative cardiovascular risk assessment _____________   Date:  08/04/2022   Patient ID:  Travis Myers, DOB 04-10-1955, MRN [id number] Patient Location:  Home Provider location:   Office  Primary Care Provider:  Lonie Peak, PA-C Primary Cardiologist:  Travis Batty, MD  Chief Complaint / Patient Profile   67 y.o. y/o male with a h/o chronic venous insufficiency, lymphedema, (followed by Dr. Cari Myers) history of ascending thoracic aortic aneurysm (measuring 48 mm by CTA performed on 03/10/2020,) and is f ollowed by CVTS, Dr. Nydia Myers, most recent echocardiogram 07/03/2018 revealed normal LV systolic function with mild to moderate aortic insufficiency.    He  is pending left total knee arthroplasty, by Dr. Raylene Myers, [facility], 08/23/2022; and presents today for telephonic preoperative cardiovascular risk assessment.  History of Present Illness    Travis Myers is a 67 y.o. male who presents via audio/video conferencing for a telehealth visit today.  Pt was last seen in cardiology clinic on 03/30/2022 by by Dr. Nanetta Myers,.  At that time Travis Myers was doing well and he was referred to Dr. Doylene Myers for ongoing surveillance of thoracic aortic aneurysm, blood pressure was well-controlled, lipid status was stable,  echocardiogram will be planned annually.  The patient is now pending procedure as outlined above. Since his last visit, he doing well. 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 No concerns about chest pain, DOE, edema, or excessive fatigue   Past Medical History    Past Medical History:  Diagnosis Date   Acute meniscal tear of knee left   Aneurysm, ascending aorta (HCC) 4.8cm per ct 2011/ CARDIOLOGSIT--  DR [name]  LOV  10-07-2011  NOTE W/ CHART AND REQUEST LEXISCAN MYOVIEW AND ECHO TO BE FAXED.   ASYMPTOMATIC   Heart murmur MILD-- ASYMPTOMATIC   Hyperlipidemia    Hypertension    OA (osteoarthritis) of knee left   OSA (obstructive sleep apnea) NON-TOLERANT CPAP   no cpap PT REPORTS NEVER FINISHED TEST    Pre-diabetes    Sleep apnea    Tinnitus of both ears    Past Surgical History:  Procedure Laterality Date   BILATERAL IINGUINAL HERNIA REPAIR  1963   DOPPLER ECHOCARDIOGRAPHY  10/18/2011   EF >55 %   ENDOVENOUS ABLATION SAPHENOUS VEIN W/ LASER Right 12/30/2020   endovenous laser ablation right greater saphenous vein by Travis Caraway MD   EXCISIONAL TOTAL KNEE ARTHROPLASTY WITH ANTIBIOTIC SPACERS Right 07/22/2021   Procedure: EXCISIONAL TOTAL KNEE ARTHROPLASTY WITH ANTIBIOTIC SPACERS;  Surgeon: Durene Romans, MD;  Location: [facility];  Service: Orthopedics;  Laterality: Right;   INCISION AND DRAINAGE OF WOUND Right 04/27/2021   Procedure: IRRIGATION AND DEBRIDEMENT OF HEMATOMA AND ABCESS ,ASPIRATE RIGHT KNEE;  Surgeon: Eugenia Mcalpine, MD;  Location: [facility];  Service: Orthopedics;  Laterality: Right;  REQUEST 3:30PM START TIME  LUMBAR LAMINECTOMY/DECOMPRESSION MICRODISCECTOMY N/A 02/24/2016   Procedure: Bilateral Decompression L4-5, Discectomy L4-5 ;  Surgeon: Jene Every, MD;  Location: [facility];  Service: Orthopedics;  Laterality: N/A;   NM MYOCAR PERF WALL MOTION  10/13/2011   EF 49 %,low risk study   REIMPLANTATION OF TOTAL KNEE Right 09/30/2021   Procedure:  REIMPLANTATION / REVISON OF TOTAL KNEE;  Surgeon: Durene Romans, MD;  Location: [facility];  Service: Orthopedics;  Laterality: Right;  150   SHOULDER ARTHROSCOPY Left 2022   TONSILLECTOMY  1965   TORN MENISCUS X 3 IN KNEES SURGERY      LEFT    TOTAL HIP ARTHROPLASTY Left 06/18/2009   OA LEFT HIP   TOTAL HIP ARTHROPLASTY Right 03/11/2014   Procedure: RIGHT TOTAL HIP ARTHROPLASTY ANTERIOR APPROACH;  Surgeon: Shelda Pal, MD;  Location: [facility];  Service: Orthopedics;  Laterality: Right;   TOTAL KNEE ARTHROPLASTY Right 02/26/2020   Procedure: TOTAL KNEE ARTHROPLASTY;  Surgeon: Eugenia Mcalpine, MD;  Location: [facility];  Service: Orthopedics;  Laterality: Right;  adductor canal   TRANSTHORACIC ECHOCARDIOGRAM  09/11/2012   EF 55 to 60 %,mild aortic valve regurg,mild mitral valve regurg,lf and rt atriums mildly dilated   VARICOSE VEIN SURGERY Left 1980   and 1994    Allergies  Allergies  Allergen Reactions   Ciprofloxacin Other (See Comments)    Unknown   Penicillins Other (See Comments)    Childhood reaction- not recalled Has patient had a PCN reaction causing immediate rash, facial/tongue/throat swelling, SOB or lightheadedness with hypotension:unsure Has patient had a PCN reaction causing severe rash involving mucus membranes or skin necrosis:unsure Has patient had a PCN reaction that required hospitalization:unsure Has patient had a PCN reaction occurring within the last 10 years:No If all of the above answers are "NO", then may proceed with Ceph.use. Tolerated ancef 07/22/21     Quinolones Other (See Comments)    Patient was warned about not using Cipro and similar antibiotics. Recent studies have raised concern that fluoroquinolone antibiotics could be associated with an increased risk of aortic aneurysm Fluoroquinolones have non-antimicrobial properties that might jeopardise the integrity of the extracellular matrix of the vascular wall In a  propensity score matched cohort study in  [place], there was a 66% increased rate of aortic aneurysm or dissection associated with oral fluoroquinolone use, compared wit    Home Medications    Prior to Admission medications   Medication Sig Start Date End Date Taking? Authorizing Provider  acetaminophen (TYLENOL) 500 MG tablet Take 1,000 mg by mouth 2 (two) times daily.    [provider]  Ascorbic Acid (VITAMIN C) 1000 MG tablet Take 1,000 mg by mouth daily. 06/11/21   [provider]  aspirin EC 81 MG tablet Take 81 mg by mouth daily. Swallow whole.    [provider]  atorvastatin (LIPITOR) 40 MG tablet TAKE 1 TABLET BY MOUTH EVERY DAY 05/31/22   Runell Gess, MD  cyanocobalamin (VITAMIN B12) 1000 MCG tablet Take 1,000 mcg by mouth daily. 06/11/21   [provider]  diclofenac (VOLTAREN) 75 MG EC tablet Take 75 mg by mouth 2 (two) times daily. 06/07/22   [provider]  furosemide (LASIX) 20 MG tablet Take 40 mg by mouth in the morning. 12/04/15   [provider]  meclizine (ANTIVERT) 25 MG tablet Take 25 mg by mouth daily. 02/05/16   [provider]  methocarbamol (ROBAXIN) 500 MG tablet Take 1 tablet (500 mg total) by mouth every  6 (six) hours as needed for muscle spasms. 10/01/21   Cassandria Anger, PA-C  pregabalin (LYRICA) 75 MG capsule Take 75 mg by mouth 2 (two) times daily. 07/07/21   [provider]  tamsulosin (FLOMAX) 0.4 MG CAPS capsule Take 0.4 mg by mouth at bedtime. 06/04/22   [provider]  traMADol (ULTRAM) 50 MG tablet Take 50 mg by mouth every 6 (six) hours as needed for severe pain.    [provider]  traZODone (DESYREL) 50 MG tablet Take 50 mg by mouth at bedtime as needed for sleep. 04/13/21   [provider]  triamcinolone (KENALOG) 0.1 % Apply 1 application  topically daily.    [provider]  valsartan (DIOVAN) 320 MG tablet Take 320 mg by mouth daily. 06/13/22   [provider]    Physical Exam     Vital Signs:  Travis Myers does not have vital signs available for review today.  Given telephonic nature of communication, physical exam is limited. AAOx3. NAD. Normal affect.  Speech and respirations are unlabored.  Accessory Clinical Findings    None  Assessment & Plan    1.  Preoperative Cardiovascular Risk Assessment:  The patient was advised that if he develops new symptoms prior to surgery to contact our office to arrange for a follow-up visit, and he verbalized understanding.  According to the Revised Cardiac Risk Index (RCRI), his  0.9. Low risk   His   according to the [facility] Activity Status Index (DASI).   Therefore, based on ACC/AHA guidelines, patient would be at acceptable risk for the planned procedure without further cardiovascular testing. I will route this recommendation to the requesting party via Epic fax function.   Per office protocol, if patient is without any new symptoms or concerns at the time of their virtual visit, he/she may hold ASA for 5-7  days prior to procedure. Please resume ASA as soon as possible postprocedure, at the discretion of the surgeon.    A copy of this note will be routed to requesting surgeon.  Time:   Today, I have spent 10 minutes with the patient with telehealth technology discussing medical history, symptoms, and management plan.     Joni Reining, NP  08/04/2022, 11:52 AM

## 2022-08-05 ENCOUNTER — Ambulatory Visit: Payer: Medicare Other | Attending: Cardiology | Admitting: Adult Health

## 2022-08-08 NOTE — Patient Instructions (Signed)
SURGICAL WAITING ROOM VISITATION  Patients having surgery or a procedure may have no more than 2 support people in the waiting area - these visitors may rotate.    Children under the age of 35 must have an adult with them who is not the patient.  Due to an increase in RSV and influenza rates and associated hospitalizations, children ages 65 and under may not visit patients in [facility] hospitals.  If the patient needs to stay at the hospital during part of their recovery, the visitor guidelines for inpatient rooms apply. Pre-op nurse will coordinate an appropriate time for 1 support person to accompany patient in pre-op.  This support person may not rotate.    Please refer to the [facility] website for the visitor guidelines for Inpatients (after your surgery is over and you are in a regular room).       Your procedure is scheduled on:  08/23/2022    Report to [facility] Main Entrance    Report to admitting at  [phone]   Call this number if you have problems the morning of surgery [phone]   Do not eat food :After Midnight.   After Midnight you may have the following liquids until _ 0415_____ AM DAY OF SURGERY  Water Non-Citrus Juices (without pulp, NO RED-Apple, White grape, White cranberry) Black Coffee (NO MILK/CREAM OR CREAMERS, sugar ok)  Clear Tea (NO MILK/CREAM OR CREAMERS, sugar ok) regular and decaf                             Plain Jell-O (NO RED)                                           Fruit ices (not with fruit pulp, NO RED)                                     Popsicles (NO RED)                                                               Sports drinks like Gatorade (NO RED)                    The day of surgery:  Drink ONE (1) Pre-Surgery Clear Ensure or G2 at   0415A M  ( have completed by ) the morning of surgery. Drink in one sitting. Do not sip.  This drink was given to you during your hospital  pre-op appointment visit. Nothing else to  drink after completing the  Pre-Surgery Clear Ensure or G2.          If you have questions, please contact your surgeon's office.       Oral Hygiene is also important to reduce your risk of infection.                                    Remember - BRUSH YOUR TEETH THE MORNING OF SURGERY WITH  YOUR REGULAR TOOTHPASTE  DENTURES WILL BE REMOVED PRIOR TO SURGERY PLEASE DO NOT APPLY "Poly grip" OR ADHESIVES!!!   Do NOT smoke after Midnight   Stop all vitamins and herbal supplements 7 days before surgery.   Take these medicines the morning of surgery with A SIP OF WATER:  lyrica   DO NOT TAKE ANY ORAL DIABETIC MEDICATIONS DAY OF YOUR SURGERY  Bring CPAP mask and tubing day of surgery.                              You may not have any metal on your body including hair pins, jewelry, and body piercing             Do not wear make-up, lotions, powders, perfumes/cologne, or deodorant  Do not wear nail polish including gel and S&S, artificial/acrylic nails, or any other type of covering on natural nails including finger and toenails. If you have artificial nails, gel coating, etc. that needs to be removed by a nail salon please have this removed prior to surgery or surgery may need to be canceled/ delayed if the surgeon/ anesthesia feels like they are unable to be safely monitored.   Do not shave  48 hours prior to surgery.               Men may shave face and neck.   Do not bring valuables to the hospital. [facility] IS NOT             RESPONSIBLE   FOR VALUABLES.   Contacts, glasses, dentures or bridgework may not be worn into surgery.   Bring small overnight bag day of surgery.   DO NOT BRING YOUR HOME MEDICATIONS TO THE HOSPITAL. PHARMACY WILL DISPENSE MEDICATIONS LISTED ON YOUR MEDICATION LIST TO YOU DURING YOUR ADMISSION IN THE HOSPITAL!    Patients discharged on the day of surgery will not be allowed to drive home.  Someone NEEDS to stay with you for the first 24 hours after  anesthesia.   Special Instructions: Bring a copy of your healthcare power of attorney and living will documents the day of surgery if you haven't scanned them before.              Please read over the following fact sheets you were given: IF YOU HAVE QUESTIONS ABOUT YOUR PRE-OP INSTRUCTIONS PLEASE CALL [phone]   If you received a COVID test during your pre-op visit  it is requested that you wear a mask when out in public, stay away from anyone that may not be feeling well and notify your surgeon if you develop symptoms. If you test positive for Covid or have been in contact with anyone that has tested positive in the last 10 days please notify you surgeon.      Pre-operative 5 CHG Bath Instructions   You can play a key role in reducing the risk of infection after surgery. Your skin needs to be as free of germs as possible. You can reduce the number of germs on your skin by washing with CHG (chlorhexidine gluconate) soap before surgery. CHG is an antiseptic soap that kills germs and continues to kill germs even after washing.   DO NOT use if you have an allergy to chlorhexidine/CHG or antibacterial soaps. If your skin becomes reddened or irritated, stop using the CHG and notify one of our RNs at [phone].   Please shower with the CHG soap starting  4 days before surgery using the following schedule:     Please keep in mind the following:  DO NOT shave, including legs and underarms, starting the day of your first shower.   You may shave your face at any point before/day of surgery.  Place clean sheets on your bed the day you start using CHG soap. Use a clean washcloth (not used since being washed) for each shower. DO NOT sleep with pets once you start using the CHG.   CHG Shower Instructions:  If you choose to wash your hair and private area, wash first with your normal shampoo/soap.  After you use shampoo/soap, rinse your hair and body thoroughly to remove shampoo/soap residue.   Turn the water OFF and apply about 3 tablespoons (45 ml) of CHG soap to a CLEAN washcloth.  Apply CHG soap ONLY FROM YOUR NECK DOWN TO YOUR TOES (washing for 3-5 minutes)  DO NOT use CHG soap on face, private areas, open wounds, or sores.  Pay special attention to the area where your surgery is being performed.  If you are having back surgery, having someone wash your back for you may be helpful. Wait 2 minutes after CHG soap is applied, then you may rinse off the CHG soap.  Pat dry with a clean towel  Put on clean clothes/pajamas   If you choose to wear lotion, please use ONLY the CHG-compatible lotions on the back of this paper.     Additional instructions for the day of surgery: DO NOT APPLY any lotions, deodorants, cologne, or perfumes.   Put on clean/comfortable clothes.  Brush your teeth.  Ask your nurse before applying any prescription medications to the skin.      CHG Compatible Lotions   Aveeno Moisturizing lotion  Cetaphil Moisturizing Cream  Cetaphil Moisturizing Lotion  Clairol Herbal Essence Moisturizing Lotion, Dry Skin  Clairol Herbal Essence Moisturizing Lotion, Extra Dry Skin  Clairol Herbal Essence Moisturizing Lotion, Normal Skin  Curel Age Defying Therapeutic Moisturizing Lotion with Alpha Hydroxy  Curel Extreme Care Body Lotion  Curel Soothing Hands Moisturizing Hand Lotion  Curel Therapeutic Moisturizing Cream, Fragrance-Free  Curel Therapeutic Moisturizing Lotion, Fragrance-Free  Curel Therapeutic Moisturizing Lotion, Original Formula  Eucerin Daily Replenishing Lotion  Eucerin Dry Skin Therapy Plus Alpha Hydroxy Crme  Eucerin Dry Skin Therapy Plus Alpha Hydroxy Lotion  Eucerin Original Crme  Eucerin Original Lotion  Eucerin Plus Crme Eucerin Plus Lotion  Eucerin TriLipid Replenishing Lotion  Keri Anti-Bacterial Hand Lotion  Keri Deep Conditioning Original Lotion Dry Skin Formula Softly Scented  Keri Deep Conditioning Original Lotion, Fragrance  Free Sensitive Skin Formula  Keri Lotion Fast Absorbing Fragrance Free Sensitive Skin Formula  Keri Lotion Fast Absorbing Softly Scented Dry Skin Formula  Keri Original Lotion  Keri Skin Renewal Lotion Keri Silky Smooth Lotion  Keri Silky Smooth Sensitive Skin Lotion  Nivea Body Creamy Conditioning Oil  Nivea Body Extra Enriched Teacher, adult education Moisturizing Lotion Nivea Crme  Nivea Skin Firming Lotion  NutraDerm 30 Skin Lotion  NutraDerm Skin Lotion  NutraDerm Therapeutic Skin Cream  NutraDerm Therapeutic Skin Lotion  ProShield Protective Hand Cream  Provon moisturizing lotion

## 2022-08-08 NOTE — Progress Notes (Addendum)
Anesthesia Review:  PCP: Lonie Peak, [facility] Clearance 07/27/22 on chart  Cardiologist : DR Jeri Cos- :PV 03/30/22  Joni Reining, NP telephone visit 08/05/22  Vascular- christopher Dickson LOV 06/16/22  Chest x-ray : EKG : 03/30/22  Echo : 02/07/22  Ct angio chest- 03/11/22  Stress test: Cardiac Cath :  Activity level: can do a flgiht of stairs wtihout difficutly  Sleep Study/ CPAP : has sleep apnea no cpap  Sleeps in recliner  Fasting Blood Sugar :      / Checks Blood Sugar -- times a day:   Blood Thinner/ Instructions /Last Dose: ASA / Instructions/ Last Dose :    81 mg aspirin    Prediabetes- does not check glucose at home on no meds    PT has lymph edema in bilateral legs and wears stockings.   PT has AAA- by CT Scan 4/22- 48 mm

## 2022-08-10 ENCOUNTER — Encounter (HOSPITAL_COMMUNITY)
Admission: RE | Admit: 2022-08-10 | Discharge: 2022-08-10 | Disposition: A | Discharge status: Home / Self Care | Payer: Medicare Other | Source: Ambulatory Visit | Attending: Orthopedic Surgery | Admitting: Orthopedic Surgery

## 2022-08-10 ENCOUNTER — Encounter (HOSPITAL_COMMUNITY): Payer: Self-pay

## 2022-08-10 ENCOUNTER — Other Ambulatory Visit: Payer: Self-pay

## 2022-08-10 VITALS — BP 123/69 | HR 61 | Temp 98.3°F | Resp 16 | Ht 76.0 in | Wt 323.0 lb

## 2022-08-10 DIAGNOSIS — G4733 Obstructive sleep apnea (adult) (pediatric): Secondary | ICD-10-CM | POA: Diagnosis not present

## 2022-08-10 DIAGNOSIS — Z01818 Encounter for other preprocedural examination: Secondary | ICD-10-CM

## 2022-08-10 DIAGNOSIS — I10 Essential (primary) hypertension: Secondary | ICD-10-CM | POA: Insufficient documentation

## 2022-08-10 DIAGNOSIS — M1712 Unilateral primary osteoarthritis, left knee: Secondary | ICD-10-CM | POA: Insufficient documentation

## 2022-08-10 DIAGNOSIS — Z01812 Encounter for preprocedural laboratory examination: Secondary | ICD-10-CM | POA: Diagnosis present

## 2022-08-10 DIAGNOSIS — I7121 Aneurysm of the ascending aorta, without rupture: Secondary | ICD-10-CM | POA: Diagnosis not present

## 2022-08-10 HISTORY — DX: Anemia, unspecified: D64.9

## 2022-08-10 HISTORY — DX: Lymphedema, not elsewhere classified: I89.0

## 2022-08-10 LAB — CBC
HCT: 41.7 % (ref 39.0–52.0)
Hemoglobin: 13.7 g/dL (ref 13.0–17.0)
MCH: 31.5 pg (ref 26.0–34.0)
MCHC: 32.9 g/dL (ref 30.0–36.0)
MCV: 95.9 fL (ref 80.0–100.0)
Platelets: 180 10*3/uL (ref 150–400)
RBC: 4.35 MIL/uL (ref 4.22–5.81)
RDW: 13.1 % (ref 11.5–15.5)
WBC: 5.2 10*3/uL (ref 4.0–10.5)
nRBC: 0 % (ref 0.0–0.2)

## 2022-08-10 LAB — SURGICAL PCR SCREEN
MRSA, PCR: NEGATIVE
Staphylococcus aureus: NEGATIVE

## 2022-08-10 LAB — BASIC METABOLIC PANEL
Anion gap: 10 (ref 5–15)
BUN: 21 mg/dL (ref 8–23)
CO2: 27 mmol/L (ref 22–32)
Calcium: 9.2 mg/dL (ref 8.9–10.3)
Chloride: 103 mmol/L (ref 98–111)
Creatinine, Ser: 0.89 mg/dL (ref 0.61–1.24)
GFR, Estimated: >60 mL/min (ref >60)
Glucose, Bld: 104 mg/dL — ABNORMAL HIGH (ref 70–99)
Potassium: 3.9 mmol/L (ref 3.5–5.1)
Sodium: 140 mmol/L (ref 135–145)

## 2022-08-11 NOTE — Anesthesia Preprocedure Evaluation (Addendum)
Anesthesia Evaluation  Patient identified by MRN, date of birth, ID band Patient awake    Reviewed: Allergy & Precautions, NPO status , Patient's Chart, lab work & pertinent test results  Airway Mallampati: II  TM Distance: >3 FB Neck ROM: Full    Dental  (+) Teeth Intact, Dental Advisory Given   Pulmonary sleep apnea    breath sounds clear to auscultation       Cardiovascular hypertension, + Valvular Problems/Murmurs  Rhythm:Regular Rate:Normal     Neuro/Psych negative neurological ROS  negative psych ROS   GI/Hepatic negative GI ROS, Neg liver ROS,,,  Endo/Other  negative endocrine ROS    Renal/GU negative Renal ROS     Musculoskeletal  (+) Arthritis ,    Abdominal   Peds  Hematology negative hematology ROS (+)   Anesthesia Other Findings   Reproductive/Obstetrics                             Anesthesia Physical Anesthesia Plan  ASA: 2  Anesthesia Plan: Spinal   Post-op Pain Management: Regional block*   Induction: Intravenous  PONV Risk Score and Plan: 3 and Ondansetron, Dexamethasone and Midazolam  Airway Management Planned:   Additional Equipment: None  Intra-op Plan:   Post-operative Plan:   Informed Consent: I have reviewed the patients History and Physical, chart, labs and discussed the procedure including the risks, benefits and alternatives for the proposed anesthesia with the patient or authorized representative who has indicated his/her understanding and acceptance.       Plan Discussed with: CRNA  Anesthesia Plan Comments: (See PAT note 08/10/2022  Lab Results      Component                Value               Date                      WBC                      5.2                 08/10/2022                HGB                      13.7                08/10/2022                HCT                      41.7                08/10/2022                MCV                       95.9                08/10/2022                PLT                      180  08/10/2022           )       Anesthesia Quick Evaluation

## 2022-08-11 NOTE — Progress Notes (Signed)
Anesthesia Chart Review   Case: [id number] Date/Time: 08/23/22 0700   Procedure: TOTAL KNEE ARTHROPLASTY (Left: Knee)   Anesthesia type: Spinal   Pre-op diagnosis: Left knee osteoarthritis   [facility]: [facility] 09 / [facility]   Surgeons: Durene Romans, MD       DISCUSSION:66 y.o. never smoker with h/o HTN, sleep apnea, OSA, AAA, left knee OA scheduled for above procedure 08/23/2022 with Dr. Durene Romans.   Pt last seen by cardiology 08/05/2022. Per OV note, "The patient was advised that if he develops new symptoms prior to surgery to contact our office to arrange for a follow-up visit, and he verbalized understanding.   According to the Revised Cardiac Risk Index (RCRI), his  0.9. Low risk    His   according to the Duke Activity Status Index (DASI).    Therefore, based on ACC/AHA guidelines, patient would be at acceptable risk for the planned procedure without further cardiovascular testing. I will route this recommendation to the requesting party via Epic fax function.    Per office protocol, if patient is without any new symptoms or concerns at the time of their virtual visit, he/she may hold ASA for 5-7  days prior to procedure. Please resume ASA as soon as possible postprocedure, at the discretion of the surgeon.  "  Seen by cardiothoracic surgeon 04/27/2022. Per OV note, "He has a stable 4.8 cm fusiform ascending aortic aneurysm which is unchanged dating back to 2011.  His echocardiogram in December 2022 showed mild to moderate aortic insufficiency.  There is mild aortic stenosis with a mean gradient of 12 mmHg and a peak gradient of 21 mmHg.  Left ventricular ejection fraction was 60 to 65%.  A follow-up echo in January 2024 showed mild aortic insufficiency.  The mean gradient across the aortic valve is unchanged at 12 mmHg.  Left ventricular ejection fraction was unchanged.  His aortic aneurysm is still below the surgical threshold of 5.5 cm.  I would expect him to have a larger aorta given his  height of 6 foot 4 and 310 pounds.  His descending aorta at the same level measures about 3 cm."  1 year follow up with repeat CTA.  VS: BP 123/69   Pulse 61   Temp 36.8 C (Oral)   Resp 16   Ht 6\' 4"  (1.93 m)   Wt (!) 146.5 kg   SpO2 98%   BMI 39.32 kg/m   PROVIDERS: Lonie Peak, PA-C is PCP   Primary Cardiologist:  Nanetta Batty, MD   Evelene Croon, MD is Cardiothoracic Surgeon LABS: Labs reviewed: Acceptable for surgery. (all labs ordered are listed, but only abnormal results are displayed)  Labs Reviewed  BASIC METABOLIC PANEL - Abnormal; Notable for the following components:      Result Value   Glucose, Bld 104 (*)    All other components within normal limits  SURGICAL PCR SCREEN  CBC     IMAGES: CT Angio Chest Aorta 03/11/2022 IMPRESSION: 1. Stable aneurysmal disease of the ascending thoracic aorta with maximum diameter of approximately 4.8 cm. Ascending thoracic aortic aneurysm. Recommend semi-annual imaging followup by CTA or MRA and referral to cardiothoracic surgery if not already obtained. This recommendation follows 2010 ACCF/AHA/AATS/ACR/ASA/SCA/SCAI/SIR/STS/SVM Guidelines for the Diagnosis and Management of Patients With Thoracic Aortic Disease. Circulation. 2010; 121: O962-X528. Aortic aneurysm NOS (ICD10-I71.9) 2. Stable thickening and calcification of the aortic valve. 3. Coronary atherosclerosis. 4. Hepatic steatosis.  EKG:   CV: Echo 02/07/2022 1. Left ventricular ejection fraction,  by estimation, is 60 to 65%. The  left ventricle has normal function. The left ventricle has no regional  wall motion abnormalities. Left ventricular diastolic parameters were  normal.   2. Right ventricular systolic function is normal. The right ventricular  size is normal. There is normal pulmonary artery systolic pressure.   3. Left atrial size was moderately dilated.   4. The mitral valve is abnormal. Trivial mitral valve regurgitation. No  evidence of  mitral stenosis.   5. The aortic valve was not well visualized. There is mild calcification  of the aortic valve. There is mild thickening of the aortic valve. Aortic  valve regurgitation is mild. Mild aortic valve stenosis.   6. The inferior vena cava is normal in size with greater than 50%  respiratory variability, suggesting right atrial pressure of 3 mmHg.  Past Medical History:  Diagnosis Date   Acute meniscal tear of knee left   Anemia    Aneurysm, ascending aorta (HCC) 4.8cm per ct 2011/ CARDIOLOGSIT--  DR [name]  LOV  10-07-2011  NOTE W/ CHART AND REQUEST LEXISCAN MYOVIEW AND ECHO TO BE FAXED.   ASYMPTOMATIC   Heart murmur MILD-- ASYMPTOMATIC   Hyperlipidemia    Hypertension    Lymph edema    bilateral legs- stockings on both legs   OA (osteoarthritis) of knee left   OSA (obstructive sleep apnea) NON-TOLERANT CPAP   no cpap PT REPORTS NEVER FINISHED TEST    Pre-diabetes    Sleep apnea    Tinnitus of both ears     Past Surgical History:  Procedure Laterality Date   BILATERAL IINGUINAL HERNIA REPAIR  1963   DOPPLER ECHOCARDIOGRAPHY  10/18/2011   EF >55 %   ENDOVENOUS ABLATION SAPHENOUS VEIN W/ LASER Right 12/30/2020   endovenous laser ablation right greater saphenous vein by Cari Caraway MD   EXCISIONAL TOTAL KNEE ARTHROPLASTY WITH ANTIBIOTIC SPACERS Right 07/22/2021   Procedure: EXCISIONAL TOTAL KNEE ARTHROPLASTY WITH ANTIBIOTIC SPACERS;  Surgeon: Durene Romans, MD;  [facility];  Service: Orthopedics;  Laterality: Right;   INCISION AND DRAINAGE OF WOUND Right 04/27/2021   Procedure: IRRIGATION AND DEBRIDEMENT OF HEMATOMA AND ABCESS ,ASPIRATE RIGHT KNEE;  Surgeon: Eugenia Mcalpine, MD;  Location[facility];  Service: Orthopedics;  Laterality: Right;  REQUEST 3:30PM START TIME   LUMBAR LAMINECTOMY/DECOMPRESSION MICRODISCECTOMY N/A 02/24/2016   Procedure: Bilateral Decompression L4-5, Discectomy L4-5 ;  Surgeon: Jene Every, MD;  [facility];  Service:  Orthopedics;  Laterality: N/A;   NM MYOCAR PERF WALL MOTION  10/13/2011   EF 49 %,low risk study   REIMPLANTATION OF TOTAL KNEE Right 09/30/2021   Procedure: REIMPLANTATION / REVISON OF TOTAL KNEE;  Surgeon: Durene Romans, MD;  [facility];  Service: Orthopedics;  Laterality: Right;  150   right elbow surgery     ulnar nerve transposiiton   SHOULDER ARTHROSCOPY Left 2022   TONSILLECTOMY  1965   TORN MENISCUS X 3 IN KNEES SURGERY      LEFT    TOTAL HIP ARTHROPLASTY Left 06/18/2009   OA LEFT HIP   TOTAL HIP ARTHROPLASTY Right 03/11/2014   Procedure: RIGHT TOTAL HIP ARTHROPLASTY ANTERIOR APPROACH;  Surgeon: Shelda Pal, MD;  [facility];  Service: Orthopedics;  Laterality: Right;   TOTAL KNEE ARTHROPLASTY Right 02/26/2020   Procedure: TOTAL KNEE ARTHROPLASTY;  Surgeon: Eugenia Mcalpine, MD;  [facility];  Service: Orthopedics;  Laterality: Right;  adductor canal   TRANSTHORACIC ECHOCARDIOGRAM  09/11/2012   EF 55  to 60 %,mild aortic valve regurg,mild mitral valve regurg,lf and rt atriums mildly dilated   VARICOSE VEIN SURGERY Left 1980   and 1994    MEDICATIONS:  acetaminophen (TYLENOL) 500 MG tablet   Ascorbic Acid (VITAMIN C) 1000 MG tablet   aspirin EC 81 MG tablet   atorvastatin (LIPITOR) 40 MG tablet   cyanocobalamin (VITAMIN B12) 1000 MCG tablet   diclofenac (VOLTAREN) 75 MG EC tablet   furosemide (LASIX) 20 MG tablet   meclizine (ANTIVERT) 25 MG tablet   methocarbamol (ROBAXIN) 500 MG tablet   Polyethyl Glycol-Propyl Glycol (LUBRICATING EYE DROPS OP)   pregabalin (LYRICA) 75 MG capsule   traMADol (ULTRAM) 50 MG tablet   triamcinolone (KENALOG) 0.1 %   valsartan (DIOVAN) 320 MG tablet   No current facility-administered medications for this encounter.    Jodell Cipro Ward, PA-C [facility] [phone]

## 2022-08-15 ENCOUNTER — Ambulatory Visit (INDEPENDENT_AMBULATORY_CARE_PROVIDER_SITE_OTHER): Payer: Medicare Other | Admitting: Podiatry

## 2022-08-15 DIAGNOSIS — L84 Corns and callosities: Secondary | ICD-10-CM

## 2022-08-15 DIAGNOSIS — I739 Peripheral vascular disease, unspecified: Secondary | ICD-10-CM

## 2022-08-15 DIAGNOSIS — B351 Tinea unguium: Secondary | ICD-10-CM | POA: Diagnosis not present

## 2022-08-15 NOTE — Progress Notes (Signed)
    Subjective:  Patient ID: Travis Myers, male    DOB: 10-Mar-1955,  MRN: [id number]  Travis Myers presents to clinic today for: No chief complaint on file. . Patient notes nails are thick and elongated, causing pain in shoe gear when ambulating.  He also notes painful calluses bilateral submet 5.  He has a painful corn between the first and second toes on the plantar aspect of the left foot.  States this is new but is very tender.  Notes that he is getting a knee replacement within the next 1 to 2 weeks  PCP is Lonie Peak, PA-C.  Patient has PCP was seen for in the past 2 months.  Allergies  Allergen Reactions   Ciprofloxacin Other (See Comments)    Unknown   Penicillins Other (See Comments)    Childhood reaction- not recalled   Quinolones Other (See Comments)    Patient was warned about not using Cipro and similar antibiotics. Recent studies have raised concern that fluoroquinolone antibiotics could be associated with an increased risk of aortic aneurysm Fluoroquinolones have non-antimicrobial properties that might jeopardise the integrity of the extracellular matrix of the vascular wall In a  propensity score matched cohort study in [place], there was a 66% increased rate of aortic aneurysm or dissection associated with oral fluoroquinolone use, compared wit    Review of Systems: Negative except as noted in the HPI.  Objective:  There were no vitals filed for this visit.  Travis Myers is a pleasant 67 y.o. male in NAD. AAO x 3.  Vascular Examination: Patient has palpable DP pulse, absent PT pulse bilateral.  Delayed capillary refill bilateral toes.  Sparse digital hair bilateral.  Proximal to distal cooling WNL bilateral.    Dermatological Examination: Interspaces are clear with no open lesions noted bilateral.  Nails are 3-11mm thick, with yellowish/brown discoloration, subungual debris and distal onycholysis x10.  There is pain with compression  of nails x10.  There are hyperkeratotic lesions noted submet 5 bilateral.  There is a focal hyperkeratotic lesion on the plantar aspect of the left forefoot near the sulcus between the first and second toes..  Patient qualifies for at-risk foot care because of PVD.  Assessment/Plan: 1. Dermatophytosis of nail   2. PVD (peripheral vascular disease) (HCC)   3. Callus of foot     Mycotic nails x10 were sharply debrided with sterile nail nippers and power debriding burr to decrease bulk and length.  Hyperkeratotic lesions x 3 were shaved with #312 blade.  Return in about 3 months (around 11/15/2022) for RFC.   Clerance Lav, DPM, FACFAS [facility]     [address] [place], [place] [address]                Office [phone]  Fax [phone]

## 2022-08-23 ENCOUNTER — Ambulatory Visit (HOSPITAL_COMMUNITY): Payer: Medicare Other | Admitting: Physician Assistant

## 2022-08-23 ENCOUNTER — Other Ambulatory Visit: Payer: Self-pay

## 2022-08-23 ENCOUNTER — Inpatient Hospital Stay (HOSPITAL_COMMUNITY)
Admission: RE | Admit: 2022-08-23 | Discharge: 2022-08-25 | DRG: 470 | Disposition: A | Discharge status: Home / Self Care | Payer: Medicare Other | Attending: Orthopedic Surgery | Admitting: Orthopedic Surgery

## 2022-08-23 ENCOUNTER — Encounter (HOSPITAL_COMMUNITY): Payer: Self-pay | Admitting: Orthopedic Surgery

## 2022-08-23 ENCOUNTER — Ambulatory Visit (HOSPITAL_BASED_OUTPATIENT_CLINIC_OR_DEPARTMENT_OTHER): Payer: Medicare Other | Admitting: Anesthesiology

## 2022-08-23 ENCOUNTER — Encounter (HOSPITAL_COMMUNITY)
Admission: RE | Disposition: A | Discharge status: Home / Self Care | Payer: Self-pay | Source: Home / Self Care | Attending: Orthopedic Surgery

## 2022-08-23 DIAGNOSIS — M1712 Unilateral primary osteoarthritis, left knee: Secondary | ICD-10-CM | POA: Diagnosis not present

## 2022-08-23 DIAGNOSIS — I890 Lymphedema, not elsewhere classified: Secondary | ICD-10-CM | POA: Diagnosis present

## 2022-08-23 DIAGNOSIS — I10 Essential (primary) hypertension: Secondary | ICD-10-CM | POA: Diagnosis present

## 2022-08-23 DIAGNOSIS — Z87891 Personal history of nicotine dependence: Secondary | ICD-10-CM

## 2022-08-23 DIAGNOSIS — Z96652 Presence of left artificial knee joint: Principal | ICD-10-CM

## 2022-08-23 DIAGNOSIS — Z880 Allergy status to penicillin: Secondary | ICD-10-CM

## 2022-08-23 DIAGNOSIS — Z96651 Presence of right artificial knee joint: Secondary | ICD-10-CM | POA: Diagnosis present

## 2022-08-23 DIAGNOSIS — E785 Hyperlipidemia, unspecified: Secondary | ICD-10-CM | POA: Diagnosis present

## 2022-08-23 DIAGNOSIS — R7303 Prediabetes: Secondary | ICD-10-CM | POA: Diagnosis present

## 2022-08-23 DIAGNOSIS — M659 Synovitis and tenosynovitis, unspecified: Secondary | ICD-10-CM | POA: Diagnosis present

## 2022-08-23 DIAGNOSIS — E669 Obesity, unspecified: Secondary | ICD-10-CM | POA: Diagnosis present

## 2022-08-23 DIAGNOSIS — Z8249 Family history of ischemic heart disease and other diseases of the circulatory system: Secondary | ICD-10-CM

## 2022-08-23 DIAGNOSIS — Z96643 Presence of artificial hip joint, bilateral: Secondary | ICD-10-CM | POA: Diagnosis present

## 2022-08-23 DIAGNOSIS — Z825 Family history of asthma and other chronic lower respiratory diseases: Secondary | ICD-10-CM

## 2022-08-23 DIAGNOSIS — Z6839 Body mass index (BMI) 39.0-39.9, adult: Secondary | ICD-10-CM

## 2022-08-23 DIAGNOSIS — Z01818 Encounter for other preprocedural examination: Secondary | ICD-10-CM

## 2022-08-23 DIAGNOSIS — Z881 Allergy status to other antibiotic agents status: Secondary | ICD-10-CM

## 2022-08-23 DIAGNOSIS — Z833 Family history of diabetes mellitus: Secondary | ICD-10-CM

## 2022-08-23 DIAGNOSIS — Z888 Allergy status to other drugs, medicaments and biological substances status: Secondary | ICD-10-CM

## 2022-08-23 HISTORY — PX: TOTAL KNEE ARTHROPLASTY: SHX125

## 2022-08-23 SURGERY — ARTHROPLASTY, KNEE, TOTAL
Anesthesia: Spinal | Site: Knee | Laterality: Left

## 2022-08-23 MED ORDER — BISACODYL 10 MG RE SUPP: 10.0000 mg | Freq: Every day | RECTAL | Status: DC | PRN

## 2022-08-23 MED ORDER — DIPHENHYDRAMINE HCL 12.5 MG/5ML PO ELIX
12.5000 mg | ORAL_SOLUTION | ORAL | Status: DC | PRN
  Administered 2022-08-23 – 2022-08-25 (×4): 25 mg via ORAL
  Filled 2022-08-23 (×4): qty 10

## 2022-08-23 MED ORDER — KETOROLAC TROMETHAMINE 15 MG/ML IJ SOLN
7.5000 mg | Freq: Four times a day (QID) | INTRAMUSCULAR | Status: AC
  Administered 2022-08-23 – 2022-08-24 (×4): 7.5 mg via INTRAVENOUS
  Filled 2022-08-23 (×3): qty 1

## 2022-08-23 MED ORDER — CHLORHEXIDINE GLUCONATE 0.12 % MT SOLN
15.0000 mL | Freq: Once | OROMUCOSAL | Status: AC
  Administered 2022-08-23: 15 mL via OROMUCOSAL

## 2022-08-23 MED ORDER — KETOROLAC TROMETHAMINE 30 MG/ML IJ SOLN
INTRAMUSCULAR | Status: DC | PRN
  Administered 2022-08-23: 30 mg

## 2022-08-23 MED ORDER — MEPERIDINE HCL 50 MG/ML IJ SOLN: 6.2500 mg | INTRAMUSCULAR | Status: DC | PRN

## 2022-08-23 MED ORDER — 0.9 % SODIUM CHLORIDE (POUR BTL) OPTIME
TOPICAL | Status: DC | PRN
  Administered 2022-08-23: 1000 mL

## 2022-08-23 MED ORDER — POVIDONE-IODINE 10 % EX SWAB: 2.0000 | Freq: Once | CUTANEOUS | Status: DC

## 2022-08-23 MED ORDER — KETOROLAC TROMETHAMINE 30 MG/ML IJ SOLN
INTRAMUSCULAR | Status: AC
  Filled 2022-08-23: qty 1

## 2022-08-23 MED ORDER — LACTATED RINGERS IV SOLN: INTRAVENOUS | Status: DC

## 2022-08-23 MED ORDER — ASPIRIN 81 MG PO CHEW
81.0000 mg | CHEWABLE_TABLET | Freq: Two times a day (BID) | ORAL | Status: DC
  Administered 2022-08-23 – 2022-08-25 (×4): 81 mg via ORAL
  Filled 2022-08-23 (×4): qty 1

## 2022-08-23 MED ORDER — FUROSEMIDE 20 MG PO TABS
40.0000 mg | ORAL_TABLET | Freq: Every day | ORAL | Status: DC
  Administered 2022-08-24 – 2022-08-25 (×2): 40 mg via ORAL
  Filled 2022-08-23 (×2): qty 2

## 2022-08-23 MED ORDER — PHENYLEPHRINE HCL-NACL 20-0.9 MG/250ML-% IV SOLN
INTRAVENOUS | Status: AC
  Filled 2022-08-23: qty 250

## 2022-08-23 MED ORDER — ROPIVACAINE HCL 5 MG/ML IJ SOLN
INTRAMUSCULAR | Status: DC | PRN
  Administered 2022-08-23: 30 mL via EPIDURAL

## 2022-08-23 MED ORDER — STERILE WATER FOR IRRIGATION IR SOLN
Status: DC | PRN
  Administered 2022-08-23: 2000 mL

## 2022-08-23 MED ORDER — PREGABALIN 75 MG PO CAPS
75.0000 mg | ORAL_CAPSULE | Freq: Two times a day (BID) | ORAL | Status: DC
  Administered 2022-08-23 – 2022-08-25 (×4): 75 mg via ORAL
  Filled 2022-08-23 (×4): qty 1

## 2022-08-23 MED ORDER — OXYCODONE HCL 5 MG PO TABS
ORAL_TABLET | ORAL | Status: AC
  Administered 2022-08-23: 10 mg via ORAL
  Filled 2022-08-23: qty 2

## 2022-08-23 MED ORDER — FENTANYL CITRATE (PF) 100 MCG/2ML IJ SOLN
INTRAMUSCULAR | Status: AC
  Filled 2022-08-23: qty 2

## 2022-08-23 MED ORDER — PROPOFOL 10 MG/ML IV BOLUS
INTRAVENOUS | Status: AC
  Filled 2022-08-23: qty 20

## 2022-08-23 MED ORDER — METHOCARBAMOL 500 MG PO TABS
500.0000 mg | ORAL_TABLET | Freq: Four times a day (QID) | ORAL | Status: DC | PRN
  Administered 2022-08-23 – 2022-08-25 (×4): 500 mg via ORAL
  Filled 2022-08-23 (×4): qty 1

## 2022-08-23 MED ORDER — ACETAMINOPHEN 10 MG/ML IV SOLN: 1000.0000 mg | Freq: Once | INTRAVENOUS | Status: DC | PRN

## 2022-08-23 MED ORDER — HYDROMORPHONE HCL 1 MG/ML IJ SOLN
0.5000 mg | INTRAMUSCULAR | Status: DC | PRN
  Administered 2022-08-23 – 2022-08-25 (×4): 1 mg via INTRAVENOUS
  Filled 2022-08-23 (×4): qty 1

## 2022-08-23 MED ORDER — ONDANSETRON HCL 4 MG PO TABS: 4.0000 mg | ORAL_TABLET | Freq: Four times a day (QID) | ORAL | Status: DC | PRN

## 2022-08-23 MED ORDER — BUPIVACAINE HCL 0.25 % IJ SOLN
INTRAMUSCULAR | Status: AC
  Filled 2022-08-23: qty 1

## 2022-08-23 MED ORDER — TRANEXAMIC ACID-NACL 1000-0.7 MG/100ML-% IV SOLN
INTRAVENOUS | Status: AC
  Administered 2022-08-23: 1000 mg via INTRAVENOUS
  Filled 2022-08-23: qty 100

## 2022-08-23 MED ORDER — IRBESARTAN 150 MG PO TABS
300.0000 mg | ORAL_TABLET | Freq: Every day | ORAL | Status: DC
  Administered 2022-08-24 – 2022-08-25 (×2): 300 mg via ORAL
  Filled 2022-08-23 (×2): qty 2

## 2022-08-23 MED ORDER — OXYCODONE HCL 5 MG PO TABS
5.0000 mg | ORAL_TABLET | ORAL | Status: DC | PRN
  Administered 2022-08-23 – 2022-08-24 (×7): 15 mg via ORAL
  Administered 2022-08-25: 10 mg via ORAL
  Administered 2022-08-25: 15 mg via ORAL
  Administered 2022-08-25: 10 mg via ORAL
  Filled 2022-08-23 (×2): qty 2
  Filled 2022-08-23 (×8): qty 3

## 2022-08-23 MED ORDER — PROPOFOL 1000 MG/100ML IV EMUL
INTRAVENOUS | Status: AC
  Filled 2022-08-23: qty 100

## 2022-08-23 MED ORDER — MIDAZOLAM HCL 2 MG/2ML IJ SOLN
INTRAMUSCULAR | Status: DC | PRN
  Administered 2022-08-23: 2 mg via INTRAVENOUS

## 2022-08-23 MED ORDER — TRANEXAMIC ACID-NACL 1000-0.7 MG/100ML-% IV SOLN
1000.0000 mg | INTRAVENOUS | Status: AC
  Administered 2022-08-23: 1000 mg via INTRAVENOUS
  Filled 2022-08-23: qty 100

## 2022-08-23 MED ORDER — AMISULPRIDE (ANTIEMETIC) 5 MG/2ML IV SOLN: 10.0000 mg | Freq: Once | INTRAVENOUS | Status: DC | PRN

## 2022-08-23 MED ORDER — METHOCARBAMOL 500 MG IVPB - SIMPLE MED: 500.0000 mg | Freq: Four times a day (QID) | INTRAVENOUS | Status: DC | PRN

## 2022-08-23 MED ORDER — ACETAMINOPHEN 160 MG/5ML PO SOLN: 325.0000 mg | Freq: Once | ORAL | Status: DC | PRN

## 2022-08-23 MED ORDER — HYDROMORPHONE HCL 1 MG/ML IJ SOLN: 0.2500 mg | INTRAMUSCULAR | Status: DC | PRN

## 2022-08-23 MED ORDER — ATORVASTATIN CALCIUM 40 MG PO TABS
40.0000 mg | ORAL_TABLET | Freq: Every day | ORAL | Status: DC
  Administered 2022-08-23 – 2022-08-25 (×3): 40 mg via ORAL
  Filled 2022-08-23 (×3): qty 1

## 2022-08-23 MED ORDER — PHENOL 1.4 % MT LIQD: 1.0000 | OROMUCOSAL | Status: DC | PRN

## 2022-08-23 MED ORDER — HYDROMORPHONE HCL 1 MG/ML IJ SOLN
0.5000 mg | INTRAMUSCULAR | Status: DC | PRN
  Administered 2022-08-23: 1 mg via INTRAVENOUS
  Filled 2022-08-23: qty 1

## 2022-08-23 MED ORDER — ACETAMINOPHEN 500 MG PO TABS
1000.0000 mg | ORAL_TABLET | Freq: Four times a day (QID) | ORAL | Status: DC
  Administered 2022-08-23 – 2022-08-25 (×8): 1000 mg via ORAL
  Filled 2022-08-23 (×8): qty 2

## 2022-08-23 MED ORDER — ORAL CARE MOUTH RINSE: 15.0000 mL | OROMUCOSAL | Status: DC | PRN

## 2022-08-23 MED ORDER — MIDAZOLAM HCL 2 MG/2ML IJ SOLN
INTRAMUSCULAR | Status: AC
  Filled 2022-08-23: qty 2

## 2022-08-23 MED ORDER — METOCLOPRAMIDE HCL 5 MG/ML IJ SOLN: 5.0000 mg | Freq: Three times a day (TID) | INTRAMUSCULAR | Status: DC | PRN

## 2022-08-23 MED ORDER — ACETAMINOPHEN 325 MG PO TABS: 325.0000 mg | ORAL_TABLET | Freq: Once | ORAL | Status: DC | PRN

## 2022-08-23 MED ORDER — ONDANSETRON HCL 4 MG/2ML IJ SOLN: 4.0000 mg | Freq: Four times a day (QID) | INTRAMUSCULAR | Status: DC | PRN

## 2022-08-23 MED ORDER — MECLIZINE HCL 25 MG PO TABS
25.0000 mg | ORAL_TABLET | Freq: Three times a day (TID) | ORAL | Status: DC | PRN
  Administered 2022-08-23: 25 mg via ORAL
  Filled 2022-08-23: qty 1

## 2022-08-23 MED ORDER — BUPIVACAINE IN DEXTROSE 0.75-8.25 % IT SOLN
INTRATHECAL | Status: DC | PRN
Start: 2022-08-23 — End: 2022-08-23
  Administered 2022-08-23: 1.8 mL via INTRATHECAL

## 2022-08-23 MED ORDER — ORAL CARE MOUTH RINSE: 15.0000 mL | Freq: Once | OROMUCOSAL | Status: AC

## 2022-08-23 MED ORDER — SODIUM CHLORIDE (PF) 0.9 % IJ SOLN
INTRAMUSCULAR | Status: AC
  Filled 2022-08-23: qty 50

## 2022-08-23 MED ORDER — METOCLOPRAMIDE HCL 5 MG PO TABS: 5.0000 mg | ORAL_TABLET | Freq: Three times a day (TID) | ORAL | Status: DC | PRN

## 2022-08-23 MED ORDER — DEXAMETHASONE SODIUM PHOSPHATE 10 MG/ML IJ SOLN
8.0000 mg | Freq: Once | INTRAMUSCULAR | Status: AC
  Administered 2022-08-23: 8 mg via INTRAVENOUS

## 2022-08-23 MED ORDER — LACTATED RINGERS IV SOLN: INTRAVENOUS | Status: DC | PRN

## 2022-08-23 MED ORDER — CEFAZOLIN IN SODIUM CHLORIDE 3-0.9 GM/100ML-% IV SOLN
3.0000 g | INTRAVENOUS | Status: AC
  Administered 2022-08-23: 3 g via INTRAVENOUS
  Filled 2022-08-23: qty 100

## 2022-08-23 MED ORDER — LIDOCAINE HCL (PF) 2 % IJ SOLN
INTRAMUSCULAR | Status: AC
  Filled 2022-08-23: qty 5

## 2022-08-23 MED ORDER — FENTANYL CITRATE (PF) 100 MCG/2ML IJ SOLN
INTRAMUSCULAR | Status: DC | PRN
  Administered 2022-08-23: 50 ug via INTRAVENOUS

## 2022-08-23 MED ORDER — PROPOFOL 10 MG/ML IV BOLUS
INTRAVENOUS | Status: DC | PRN
Start: 2022-08-23 — End: 2022-08-23
  Administered 2022-08-23 (×2): 30 mg via INTRAVENOUS

## 2022-08-23 MED ORDER — TRAMADOL HCL 50 MG PO TABS
50.0000 mg | ORAL_TABLET | Freq: Four times a day (QID) | ORAL | Status: DC | PRN
  Administered 2022-08-23 – 2022-08-24 (×3): 100 mg via ORAL
  Filled 2022-08-23 (×4): qty 2

## 2022-08-23 MED ORDER — ACETAMINOPHEN 325 MG PO TABS: 325.0000 mg | ORAL_TABLET | Freq: Four times a day (QID) | ORAL | Status: DC | PRN

## 2022-08-23 MED ORDER — CEFAZOLIN SODIUM-DEXTROSE 2-4 GM/100ML-% IV SOLN
2.0000 g | Freq: Four times a day (QID) | INTRAVENOUS | Status: AC
  Administered 2022-08-23 (×2): 2 g via INTRAVENOUS
  Filled 2022-08-23 (×2): qty 100

## 2022-08-23 MED ORDER — TRANEXAMIC ACID-NACL 1000-0.7 MG/100ML-% IV SOLN: 1000.0000 mg | Freq: Once | INTRAVENOUS | Status: AC

## 2022-08-23 MED ORDER — METHOCARBAMOL 500 MG IVPB - SIMPLE MED
INTRAVENOUS | Status: AC
  Administered 2022-08-23: 500 mg via INTRAVENOUS
  Filled 2022-08-23: qty 55

## 2022-08-23 MED ORDER — OXYCODONE HCL 5 MG PO TABS
5.0000 mg | ORAL_TABLET | ORAL | Status: DC | PRN
  Administered 2022-08-23: 10 mg via ORAL
  Filled 2022-08-23: qty 2

## 2022-08-23 MED ORDER — CHLORHEXIDINE GLUCONATE CLOTH 2 % EX PADS: 6.0000 | MEDICATED_PAD | Freq: Every day | CUTANEOUS | Status: DC

## 2022-08-23 MED ORDER — DEXAMETHASONE SODIUM PHOSPHATE 10 MG/ML IJ SOLN
10.0000 mg | Freq: Once | INTRAMUSCULAR | Status: AC
  Administered 2022-08-24: 10 mg via INTRAVENOUS
  Filled 2022-08-23: qty 1

## 2022-08-23 MED ORDER — SODIUM CHLORIDE 0.9 % IV SOLN: INTRAVENOUS | Status: DC

## 2022-08-23 MED ORDER — SODIUM CHLORIDE 0.9 % IR SOLN
Status: DC | PRN
  Administered 2022-08-23: 3000 mL

## 2022-08-23 MED ORDER — PHENYLEPHRINE 80 MCG/ML (10ML) SYRINGE FOR IV PUSH (FOR BLOOD PRESSURE SUPPORT)
PREFILLED_SYRINGE | INTRAVENOUS | Status: AC
  Filled 2022-08-23: qty 10

## 2022-08-23 MED ORDER — SODIUM CHLORIDE (PF) 0.9 % IJ SOLN
INTRAMUSCULAR | Status: DC | PRN
  Administered 2022-08-23: 30 mL

## 2022-08-23 MED ORDER — POLYETHYLENE GLYCOL 3350 17 G PO PACK
17.0000 g | PACK | Freq: Two times a day (BID) | ORAL | Status: DC
  Administered 2022-08-23 – 2022-08-25 (×4): 17 g via ORAL
  Filled 2022-08-23 (×5): qty 1

## 2022-08-23 MED ORDER — MENTHOL 3 MG MT LOZG: 1.0000 | LOZENGE | OROMUCOSAL | Status: DC | PRN

## 2022-08-23 MED ORDER — PROMETHAZINE HCL 25 MG/ML IJ SOLN: 6.2500 mg | INTRAMUSCULAR | Status: DC | PRN

## 2022-08-23 MED ORDER — DOCUSATE SODIUM 100 MG PO CAPS
100.0000 mg | ORAL_CAPSULE | Freq: Two times a day (BID) | ORAL | Status: DC
  Administered 2022-08-23 – 2022-08-25 (×5): 100 mg via ORAL
  Filled 2022-08-23 (×5): qty 1

## 2022-08-23 MED ORDER — DEXAMETHASONE SODIUM PHOSPHATE 10 MG/ML IJ SOLN
INTRAMUSCULAR | Status: AC
  Filled 2022-08-23: qty 1

## 2022-08-23 MED ORDER — EPINEPHRINE PF 1 MG/ML IJ SOLN
INTRAMUSCULAR | Status: AC
  Filled 2022-08-23: qty 1

## 2022-08-23 MED ORDER — FUROSEMIDE 20 MG PO TABS
20.0000 mg | ORAL_TABLET | Freq: Every day | ORAL | Status: DC
  Administered 2022-08-24 – 2022-08-25 (×2): 20 mg via ORAL
  Filled 2022-08-23 (×2): qty 1

## 2022-08-23 MED ORDER — PROPOFOL 500 MG/50ML IV EMUL
INTRAVENOUS | Status: DC | PRN
  Administered 2022-08-23: 75 ug/kg/min via INTRAVENOUS

## 2022-08-23 MED ORDER — MECLIZINE HCL 25 MG PO TABS
25.0000 mg | ORAL_TABLET | Freq: Every day | ORAL | Status: DC
  Administered 2022-08-23: 25 mg via ORAL
  Filled 2022-08-23: qty 1

## 2022-08-23 SURGICAL SUPPLY — 59 items
ADH SKN CLS APL DERMABOND .7 (GAUZE/BANDAGES/DRESSINGS) ×1
ATTUNE MED ANAT PAT 41 KNEE (Knees) IMPLANT
BAG COUNTER SPONGE SURGICOUNT (BAG) IMPLANT
BAG SPEC THK2 15X12 ZIP CLS (MISCELLANEOUS)
BAG SPNG CNTER NS LX DISP (BAG)
BAG ZIPLOCK 12X15 (MISCELLANEOUS) IMPLANT
BASEPLATE TIB CMT FB PCKT SZ 9 (Knees) IMPLANT
BLADE SAW SGTL 13.0X1.19X90.0M (BLADE) ×1 IMPLANT
BNDG CMPR 6 X 5 YARDS HK CLSR (GAUZE/BANDAGES/DRESSINGS) ×1
BNDG CMPR MED 15X6 ELC VLCR LF (GAUZE/BANDAGES/DRESSINGS) ×1
BNDG ELASTIC 6INX 5YD STR LF (GAUZE/BANDAGES/DRESSINGS) ×1 IMPLANT
BNDG ELASTIC 6X15 VLCR STRL LF (GAUZE/BANDAGES/DRESSINGS) IMPLANT
BOWL SMART MIX CTS (DISPOSABLE) ×1 IMPLANT
BSPLAT TIB 9 CMNT FXBRNG STRL (Knees) ×1 IMPLANT
CEMENT HV SMART SET (Cement) IMPLANT
COMP FEM CMT ATTUNE 8 LT (Joint) ×1 IMPLANT
COMPONENT FEM CMT ATTUNE 8 LT (Joint) IMPLANT
COVER SURGICAL LIGHT HANDLE (MISCELLANEOUS) ×1 IMPLANT
CUFF TOURN SGL QUICK 34 (TOURNIQUET CUFF) ×1
CUFF TRNQT CYL 34X4.125X (TOURNIQUET CUFF) ×1 IMPLANT
DERMABOND ADVANCED .7 DNX12 (GAUZE/BANDAGES/DRESSINGS) ×1 IMPLANT
DRAPE U-SHAPE 47X51 STRL (DRAPES) ×1 IMPLANT
DRESSING AQUACEL AG SP 3.5X10 (GAUZE/BANDAGES/DRESSINGS) ×1 IMPLANT
DRSG AQUACEL AG ADV 3.5X10 (GAUZE/BANDAGES/DRESSINGS) IMPLANT
DRSG AQUACEL AG ADV 3.5X14 (GAUZE/BANDAGES/DRESSINGS) IMPLANT
DRSG AQUACEL AG SP 3.5X10 (GAUZE/BANDAGES/DRESSINGS) ×1
DURAPREP 26ML APPLICATOR (WOUND CARE) ×2 IMPLANT
ELECT REM PT RETURN 15FT ADLT (MISCELLANEOUS) ×1 IMPLANT
GLOVE BIO SURGEON STRL SZ 6 (GLOVE) ×1 IMPLANT
GLOVE BIOGEL PI IND STRL 6.5 (GLOVE) ×1 IMPLANT
GLOVE BIOGEL PI IND STRL 7.5 (GLOVE) ×1 IMPLANT
GLOVE ORTHO TXT STRL SZ7.5 (GLOVE) ×2 IMPLANT
GOWN STRL REUS W/ TWL LRG LVL3 (GOWN DISPOSABLE) ×2 IMPLANT
GOWN STRL REUS W/TWL LRG LVL3 (GOWN DISPOSABLE) ×2
HANDPIECE INTERPULSE COAX TIP (DISPOSABLE) ×1
HOLDER FOLEY CATH W/STRAP (MISCELLANEOUS) IMPLANT
INSERT MED ATTUNE KNEE 8 5LT (Insert) IMPLANT
INSR MED ATTUNE KNEE 8 5LT (Insert) ×1 IMPLANT
KIT TURNOVER KIT A (KITS) IMPLANT
MANIFOLD NEPTUNE II (INSTRUMENTS) ×1 IMPLANT
NDL SAFETY ECLIP 18X1.5 (MISCELLANEOUS) IMPLANT
NS IRRIG 1000ML POUR BTL (IV SOLUTION) ×1 IMPLANT
PACK TOTAL KNEE CUSTOM (KITS) ×1 IMPLANT
PIN FIX SIGMA LCS THRD HI (PIN) IMPLANT
PROTECTOR NERVE ULNAR (MISCELLANEOUS) ×1 IMPLANT
SET HNDPC FAN SPRY TIP SCT (DISPOSABLE) ×1 IMPLANT
SET PAD KNEE POSITIONER (MISCELLANEOUS) ×1 IMPLANT
SPIKE FLUID TRANSFER (MISCELLANEOUS) ×2 IMPLANT
SUT MNCRL AB 4-0 PS2 18 (SUTURE) ×1 IMPLANT
SUT STRATAFIX PDS+ 0 24IN (SUTURE) ×1 IMPLANT
SUT VIC AB 1 CT1 36 (SUTURE) ×1 IMPLANT
SUT VIC AB 2-0 CT1 27 (SUTURE) ×2
SUT VIC AB 2-0 CT1 TAPERPNT 27 (SUTURE) ×2 IMPLANT
SYR 3ML LL SCALE MARK (SYRINGE) ×1 IMPLANT
TOWEL GREEN STERILE FF (TOWEL DISPOSABLE) ×1 IMPLANT
TRAY FOLEY MTR SLVR 16FR STAT (SET/KITS/TRAYS/PACK) ×1 IMPLANT
TUBE SUCTION HIGH CAP CLEAR NV (SUCTIONS) ×1 IMPLANT
WATER STERILE IRR 1000ML POUR (IV SOLUTION) ×2 IMPLANT
WRAP KNEE MAXI GEL POST OP (GAUZE/BANDAGES/DRESSINGS) ×1 IMPLANT

## 2022-08-23 NOTE — Anesthesia Postprocedure Evaluation (Signed)
Anesthesia Post Note  Patient: Travis Myers  Procedure(s) Performed: TOTAL KNEE ARTHROPLASTY (Left: Knee)     Patient location during evaluation: PACU Anesthesia Type: Spinal Level of consciousness: oriented and awake and alert Pain management: pain level controlled Vital Signs Assessment: post-procedure vital signs reviewed and stable Respiratory status: spontaneous breathing, respiratory function stable and patient connected to nasal cannula oxygen Cardiovascular status: blood pressure returned to baseline and stable Postop Assessment: no headache, no backache, no apparent nausea or vomiting and spinal receding Anesthetic complications: no  No notable events documented.  Last Vitals:  Vitals:   08/23/22 1200 08/23/22 1257  BP: (!) 168/78 (!) 166/80  Pulse: (!) 54 69  Resp: 13 18  Temp:  36.7 C  SpO2: 96% 98%    Last Pain:  Vitals:   08/23/22 1302  TempSrc:   PainSc: 4                  Shelton Silvas

## 2022-08-23 NOTE — Anesthesia Procedure Notes (Signed)
Spinal  Start time: 08/23/2022 7:25 AM End time: 08/23/2022 7:27 AM Reason for block: post-op pain management Staffing Performed: anesthesiologist  Anesthesiologist: Shelton Silvas, MD Performed by: Shelton Silvas, MD Authorized by: Shelton Silvas, MD   Preanesthetic Checklist Completed: patient identified, IV checked, site marked, risks and benefits discussed, surgical consent, monitors and equipment checked, pre-op evaluation and timeout performed Spinal Block Patient position: sitting Prep: DuraPrep and site prepped and draped Location: L3-4 Injection technique: single-shot Needle Needle type: Pencan  Needle gauge: 24 G Needle length: 10 cm Needle insertion depth: 10 cm Additional Notes Patient tolerated well. No immediate complications.  Functioning IV was confirmed and monitors were applied. Sterile prep and drape, including hand hygiene and sterile gloves were used. The patient was positioned and the back was prepped. The skin was anesthetized with lidocaine. Free flow of clear CSF was obtained prior to injecting local anesthetic into the CSF. The spinal needle aspirated freely following injection. The needle was carefully withdrawn. The patient tolerated the procedure well.

## 2022-08-23 NOTE — H&P (Signed)
TOTAL KNEE ADMISSION H&P  Patient is being admitted for left total knee arthroplasty.  Therapy Plans: outpatient therapy at [facility] Disposition: Home with wife Planned DVT Prophylaxis: aspirin 81mg  BID DME needed: none PCP: Lonie Peak - clearance received Cardio: Leodis Rains - clearance received (hx of AAA) TXA: IV Allergies: PCN - childhood, cipro - unknown, quinolones Anesthesia Concerns: none BMI: 39.3 Last HgbA1c: Not diabetic   Other: - oxy (has at home), tramadol (feels tramadol works better), methocarbamol, tylenol, celebrex - No hx of VTE or cancer - abx for 2 weeks post op - Had right shoulder subacromial injection at pre-op visit    Subjective:  Chief Complaint:left knee pain.  HPI: Travis Myers, 67 y.o. male, has a history of pain and functional disability in the left knee due to arthritis and has failed non-surgical conservative treatments for greater than 12 weeks to includeNSAID's and/or analgesics, corticosteriod injections, and activity modification.  Onset of symptoms was gradual, starting 2 years ago with gradually worsening course since that time. The patient noted no past surgery on the left knee(s).  Patient currently rates pain in the left knee(s) at 8 out of 10 with activity. Patient has worsening of pain with activity and weight bearing and pain that interferes with activities of daily living.  Patient has evidence of joint space narrowing by imaging studies. There is no active infection.  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   Infection of right knee (HCC) 04/26/2021   Infected hematoma    Osteoarthritis of right knee 02/26/2020   Aortic regurgitation 07/26/2018   Spinal stenosis of lumbar region  02/24/2016   HNP (herniated nucleus pulposus), lumbar 02/24/2016   Obese 03/13/2014   S/P right THA, AA 03/11/2014   Preoperative clearance 02/28/2014   Thoracic aortic aneurysm (HCC) 03/14/2013   Essential hypertension 03/14/2013   Hyperlipidemia 03/14/2013   Past Medical History:  Diagnosis Date   Acute meniscal tear of knee left   Anemia    Aneurysm, ascending aorta (HCC) 4.8cm per ct 2011/ CARDIOLOGSIT--  DR [name]  LOV  10-07-2011  NOTE W/ CHART AND REQUEST LEXISCAN MYOVIEW AND ECHO TO BE FAXED.   ASYMPTOMATIC   Heart murmur MILD-- ASYMPTOMATIC   Hyperlipidemia    Hypertension    Lymph edema    bilateral legs- stockings on both legs   OA (osteoarthritis) of knee left   OSA (obstructive sleep apnea) NON-TOLERANT CPAP   no cpap PT REPORTS NEVER FINISHED TEST    Pre-diabetes    Sleep apnea    Tinnitus of both ears     Past Surgical History:  Procedure Laterality Date   BILATERAL IINGUINAL HERNIA REPAIR  1963   DOPPLER ECHOCARDIOGRAPHY  10/18/2011   EF >55 %   ENDOVENOUS ABLATION SAPHENOUS VEIN W/ LASER Right 12/30/2020   endovenous laser ablation right greater saphenous vein by Cari Caraway MD   EXCISIONAL TOTAL KNEE ARTHROPLASTY WITH ANTIBIOTIC SPACERS Right 07/22/2021   Procedure: EXCISIONAL TOTAL KNEE ARTHROPLASTY WITH ANTIBIOTIC SPACERS;  Surgeon: Durene Romans, MD;  Location[facility];  Service: Orthopedics;  Laterality: Right;   INCISION AND DRAINAGE OF WOUND Right 04/27/2021   Procedure: IRRIGATION AND DEBRIDEMENT OF HEMATOMA AND ABCESS ,ASPIRATE RIGHT KNEE;  Surgeon: Eugenia Mcalpine, MD;  Location: [facility];  Service: Orthopedics;  Laterality: Right;  REQUEST 3:30PM START TIME   LUMBAR LAMINECTOMY/DECOMPRESSION MICRODISCECTOMY N/A 02/24/2016   Procedure: Bilateral Decompression L4-5, Discectomy L4-5 ;  Surgeon: Jene Every, MD;  [facility];  Service: Orthopedics;  Laterality: N/A;   NM MYOCAR PERF WALL MOTION  10/13/2011   EF 49 %,low risk study    REIMPLANTATION OF TOTAL KNEE Right 09/30/2021   Procedure: REIMPLANTATION / REVISON OF TOTAL KNEE;  Surgeon: Durene Romans, MD;  [facility];  Service: Orthopedics;  Laterality: Right;  150   right elbow surgery     ulnar nerve transposiiton   SHOULDER ARTHROSCOPY Left 2022   TONSILLECTOMY  1965   TORN MENISCUS X 3 IN KNEES SURGERY      LEFT    TOTAL HIP ARTHROPLASTY Left 06/18/2009   OA LEFT HIP   TOTAL HIP ARTHROPLASTY Right 03/11/2014   Procedure: RIGHT TOTAL HIP ARTHROPLASTY ANTERIOR APPROACH;  Surgeon: Shelda Pal, MD;  [facility];  Service: Orthopedics;  Laterality: Right;   TOTAL KNEE ARTHROPLASTY Right 02/26/2020   Procedure: TOTAL KNEE ARTHROPLASTY;  Surgeon: Eugenia Mcalpine, MD;  [facility];  Service: Orthopedics;  Laterality: Right;  adductor canal   TRANSTHORACIC ECHOCARDIOGRAM  09/11/2012   EF 55 to 60 %,mild aortic valve regurg,mild mitral valve regurg,lf and rt atriums mildly dilated   VARICOSE VEIN SURGERY Left 1980   and 1994    Current Facility-Administered Medications  Medication Dose Route Frequency Provider Last Rate Last Admin   ceFAZolin (ANCEF) IVPB 3g/100 mL premix  3 g Intravenous On Call to OR Durene Romans, MD       dexamethasone (DECADRON) injection 8 mg  8 mg Intravenous Once Cassandria Anger, PA-C       lactated ringers infusion   Intravenous Continuous Cassandria Anger, PA-C       lactated ringers infusion   Intravenous Continuous Bethena Midget, MD 10 mL/hr at 08/23/22 0553 New Bag at 08/23/22 0553   povidone-iodine 10 % swab 2 Application  2 Application Topical Once Cassandria Anger, PA-C       tranexamic acid (CYKLOKAPRON) IVPB 1,000 mg  1,000 mg Intravenous To OR Cassandria Anger, PA-C       Allergies  Allergen Reactions   Ciprofloxacin Other (See Comments)    Unknown   Penicillins Other (See Comments)    Childhood reaction- not recalled   Quinolones Other (See Comments)    Patient was warned about not using Cipro and  similar antibiotics. Recent studies have raised concern that fluoroquinolone antibiotics could be associated with an increased risk of aortic aneurysm Fluoroquinolones have non-antimicrobial properties that might jeopardise the integrity of the extracellular matrix of the vascular wall In a  propensity score matched cohort study in [place], there was a 66% increased rate of aortic aneurysm or dissection associated with oral fluoroquinolone use, compared wit    Social History   Tobacco Use   Smoking status: Never   Smokeless tobacco: Former    Quit date: 09/11/1988  Substance Use Topics   Alcohol use: Yes    Alcohol/week: 1.0 standard drink of alcohol    Types: 1 Standard drinks or equivalent per week    Comment: RARE    Family History  Problem Relation Age of Onset   Diabetes Mother    Cancer Mother    Heart failure Other    Diabetes Other    Cancer Other    COPD Other    Colon  cancer Neg Hx    Stomach cancer Neg Hx      Review of Systems  Constitutional:  Negative for chills and fever.  Respiratory:  Negative for cough and shortness of breath.   Cardiovascular:  Negative for chest pain.  Gastrointestinal:  Negative for nausea and vomiting.  Musculoskeletal:  Positive for arthralgias.     Objective:  Physical Exam Well nourished and well developed. General: Alert and oriented x3, cooperative and pleasant, no acute distress. Head: normocephalic, atraumatic, neck supple. Eyes: EOMI.  Musculoskeletal: Left knee exam: No palpable effusion, warmth erythema No cellulitic changes around the knee Tenderness medially Slight flexion contracture with flexion over 110 degrees with crepitation and tightness and pain over the anterior medial aspect knee  Right knee exam: Well-healed surgical incision Some tenderness proximal to the patella but no palpable defect Full active extension with flexion over 100 degrees Stable medial and lateral collateral ligaments   Vital signs  in last 24 hours: Weight:  [146.5 kg] 146.5 kg (08/13 0550)  Labs:   Estimated body mass index is 39.32 kg/m as calculated from the following:   Height as of this encounter: 6\' 4"  (1.93 m).   Weight as of this encounter: 146.5 kg.   Imaging Review Plain radiographs demonstrate severe degenerative joint disease of the left knee(s). The overall alignment isneutral. The bone quality appears to be adequate for age and reported activity level.      Assessment/Plan:  End stage arthritis, left knee   The patient history, physical examination, clinical judgment of the provider and imaging studies are consistent with end stage degenerative joint disease of the left knee(s) and total knee arthroplasty is deemed medically necessary. The treatment options including medical management, injection therapy arthroscopy and arthroplasty were discussed at length. The risks and benefits of total knee arthroplasty were presented and reviewed. The risks due to aseptic loosening, infection, stiffness, patella tracking problems, thromboembolic complications and other imponderables were discussed. The patient acknowledged the explanation, agreed to proceed with the plan and consent was signed. Patient is being admitted for inpatient treatment for surgery, pain control, PT, OT, prophylactic antibiotics, VTE prophylaxis, progressive ambulation and ADL's and discharge planning. The patient is planning to be discharged  home.     Patient's anticipated LOS is less than 2 midnights, meeting these requirements: - Younger than 42 - Lives within 1 hour of care - Has a competent adult at home to recover with post-op recover - NO history of  - Chronic pain requiring opiods  - Diabetes  - Coronary Artery Disease  - Heart failure  - Heart attack  - Stroke  - DVT/VTE  - Cardiac arrhythmia  - Respiratory Failure/COPD  - Renal failure  - Anemia  - Advanced Liver disease  Rosalene Billings, PA-C Orthopedic  Surgery [facility] [place] [phone]

## 2022-08-23 NOTE — Evaluation (Signed)
Physical Therapy Evaluation Patient Details Name: Travis Myers MRN: [id number] DOB: December 21, 1955 Today's Date: 08/23/2022  History of Present Illness  67 y.o. male s/p L TKA on 08/23/2022 due to failure of conservative measures. Pt  PMH includes but is not limited to: HTN, HLD, pre-diabetes, sleep apnea, AAA, meniscal tear Lt knee, R THA (2016), L THA (2011), lyphademia, lumbar laminectomy/decompression (2018), R TKA (2022) and R TKA revision (02/2022).  Clinical Impression    Travis Myers is a 67 y.o. male POD 0 s/p L TKA. Patient reports mod I with SPC with mobility at baseline. Patient is now limited by functional impairments (see PT problem list below) and requires mod A for bed mobility and mod A  for transfers. Patient was unable to safely ambulate due to slow regression of spinal impacting LLE stability and pain report of 20/10. Patient will benefit from continued skilled PT interventions to address impairments and progress towards PLOF. Acute PT will follow to progress mobility and stair training in preparation for safe discharge home with family support and OPPT services.       If plan is discharge home, recommend the following: Two people to help with walking and/or transfers;A lot of help with bathing/dressing/bathroom;Assistance with cooking/housework;Assist for transportation;Help with stairs or ramp for entrance   Can travel by private vehicle        Equipment Recommendations None recommended by PT  Recommendations for Other Services       Functional Status Assessment Patient has had a recent decline in their functional status and demonstrates the ability to make significant improvements in function in a reasonable and predictable amount of time.     Precautions / Restrictions Precautions Precautions: Knee;Fall Restrictions Weight Bearing Restrictions: No LLE Weight Bearing: Weight bearing as tolerated      Mobility  Bed Mobility Overal bed mobility:  Needs Assistance Bed Mobility: Supine to Sit, Sit to Supine     Supine to sit: Mod assist, HOB elevated, Used rails Sit to supine: Mod assist, HOB elevated, Used rails   General bed mobility comments: pt required max A for LLE to EOB for supine to sit, pt required max A for LLE and mod A for R LE to return to bed, pt requested HOB be elevated for sit to supine due to pt reported vertigo    Transfers Overall transfer level: Needs assistance Equipment used: Rolling walker (2 wheels) Transfers: Sit to/from Stand Sit to Stand: Mod assist, From elevated surface           General transfer comment: pt agreeable to standing trial from elevated EOB, pt self limitng LLE WB and required mod A to compelte pull to stand, once in standing pt required CGA, cues for posture and noted LLE instability with attempts to WB and heavy reliance on B UE support at RW, pt was able to side step to R with RW, min A , incresed time and cues    Ambulation/Gait               General Gait Details: NT due to pt pain response and apparent slow regresison of anesthesia  Stairs            Wheelchair Mobility     Tilt Bed    Modified Rankin (Stroke Patients Only)       Balance Overall balance assessment: Needs assistance Sitting-balance support: Feet supported Sitting balance-Leahy Scale: Good     Standing balance support: Bilateral upper extremity supported, During functional  activity, Reliant on assistive device for balance Standing balance-Leahy Scale: Poor                               Pertinent Vitals/Pain Pain Assessment Pain Assessment: 0-10 Pain Score: 10-Worst pain ever (pt reports 20/20 L knee pain (patella)) Pain Location: L Knee and shin, R knee and shin Pain Descriptors / Indicators: Burning, Aching, Constant, Discomfort, Operative site guarding Pain Intervention(s): Limited activity within patient's tolerance, Monitored during session, Premedicated before  session, Repositioned, Ice applied (pain medication administered at beginng of tx session)    Home Living Family/patient expects to be discharged to:: Private residence Living Arrangements: Spouse/significant other Available Help at Discharge: Family Type of Home: House Home Access: Stairs to enter Entrance Stairs-Rails: Right Entrance Stairs-Number of Steps: 3   Home Layout: One level Home Equipment: Agricultural consultant (2 wheels);Cane - single point;BSC/3in1      Prior Function Prior Level of Function : Independent/Modified Independent             Mobility Comments: Mod I with SPC for ADLs, self care tasks, IADLs       Extremity/Trunk Assessment        Lower Extremity Assessment Lower Extremity Assessment: LLE deficits/detail LLE Deficits / Details: ankle DF/PF 5/5; PROM for SLR, pt indicates barely able to egage glutes LLE Sensation: decreased light touch;decreased proprioception (pt reports B LE numbness and burning however able to appropriately report sensation B LEs and glute region with light touch)    Cervical / Trunk Assessment Cervical / Trunk Assessment:  (head forward)  Communication   Communication Communication: No apparent difficulties  Cognition Arousal: Alert Behavior During Therapy: WFL for tasks assessed/performed Overall Cognitive Status: Within Functional Limits for tasks assessed                                          General Comments      Exercises     Assessment/Plan    PT Assessment Patient needs continued PT services  PT Problem List Decreased strength;Decreased range of motion;Decreased activity tolerance;Decreased balance;Decreased mobility;Decreased coordination;Pain       PT Treatment Interventions DME instruction;Gait training;Stair training;Functional mobility training;Therapeutic activities;Therapeutic exercise;Balance training;Neuromuscular re-education;Patient/family education;Modalities    PT Goals  (Current goals can be found in the Care Plan section)  Acute Rehab PT Goals Patient Stated Goal: to be able to tap dance again, walk without pain and [place] PT Goal Formulation: With patient Time For Goal Achievement: 09/06/22 Potential to Achieve Goals: Good    Frequency 7X/week     Co-evaluation               AM-PAC PT "6 Clicks" Mobility  Outcome Measure Help needed turning from your back to your side while in a flat bed without using bedrails?: A Little Help needed moving from lying on your back to sitting on the side of a flat bed without using bedrails?: A Lot Help needed moving to and from a bed to a chair (including a wheelchair)?: A Lot Help needed standing up from a chair using your arms (e.g., wheelchair or bedside chair)?: A Lot Help needed to walk in hospital room?: Total Help needed climbing 3-5 steps with a railing? : Total 6 Click Score: 11    End of Session Equipment  Utilized During Treatment: Gait belt Activity Tolerance: Patient limited by pain Patient left: in bed;with call bell/phone within reach Nurse Communication: Mobility status;Other (comment) (pt requesting topical cream for a rash) PT Visit Diagnosis: Unsteadiness on feet (R26.81);Other abnormalities of gait and mobility (R26.89);Muscle weakness (generalized) (M62.81);Difficulty in walking, not elsewhere classified (R26.2);Pain Pain - Right/Left: Left Pain - part of body: Knee;Leg    Time: 6295-2841 PT Time Calculation (min) (ACUTE ONLY): 24 min   Charges:   PT Evaluation $PT Eval Low Complexity: 1 Low PT Treatments $Therapeutic Activity: 8-22 mins PT General Charges $$ ACUTE PT VISIT: 1 Visit         Johnny Bridge, PT Acute Rehab   Jacqualyn Posey 08/23/2022, 3:01 PM

## 2022-08-23 NOTE — Discharge Instructions (Signed)

## 2022-08-23 NOTE — Transfer of Care (Addendum)
Immediate Anesthesia Transfer of Care Note  Patient: Travis Myers  Procedure(s) Performed: TOTAL KNEE ARTHROPLASTY (Left: Knee)  Patient Location: PACU  Anesthesia Type:Spinal  Level of Consciousness: awake and patient cooperative  Airway & Oxygen Therapy: Patient Spontanous Breathing and Patient connected to face mask oxygen  Post-op Assessment: Report given to RN and Post -op Vital signs reviewed and stable  Post vital signs: Reviewed and stable  Last Vitals:  Vitals Value Taken Time  BP 145/77 08/23/22 0923  Temp 36.5 08/23/22  0923  Pulse 63 08/23/22 0925  Resp 11 08/23/22 0925  SpO2 97 % 08/23/22 0925  Vitals shown include unfiled device data.  Last Pain:  Vitals:   08/23/22 1610  TempSrc: Oral  PainSc:       Patients Stated Pain Goal: 5 (08/23/22 0550)  Complications: No notable events documented.

## 2022-08-23 NOTE — Anesthesia Procedure Notes (Signed)
Anesthesia Regional Block: Adductor canal block   Pre-Anesthetic Checklist: , timeout performed,  Correct Patient, Correct Site, Correct Laterality,  Correct Procedure, Correct Position, site marked,  Risks and benefits discussed,  Surgical consent,  Pre-op evaluation,  At surgeon's request and post-op pain management  Laterality: Left  Prep: chloraprep       Needles:  Injection technique: Single-shot  Needle Type: Echogenic Stimulator Needle     Needle Length: 9cm  Needle Gauge: 21     Additional Needles:   Procedures:,,,, ultrasound used (permanent image in chart),,    Narrative:  Start time: 08/23/2022 7:00 AM End time: 08/23/2022 7:05 AM Injection made incrementally with aspirations every 5 mL.  Performed by: Personally  Anesthesiologist: Shelton Silvas, MD  Additional Notes: Discussed risks and benefits of the nerve block in detail, including but not limited vascular injury, permanent nerve damage and infection.   Patient tolerated the procedure well. Local anesthetic introduced in an incremental fashion under minimal resistance after negative aspirations. No paresthesias were elicited. After completion of the procedure, no acute issues were identified and patient continued to be monitored by RN.

## 2022-08-23 NOTE — Op Note (Signed)
NAME:  Travis Myers                      MEDICAL RECORD NO.:  [id number]                             FACILITY:  [facility]      PHYSICIAN:  Madlyn Frankel. Charlann Boxer, M.D.  DATE OF BIRTH:  Jun 18, 1955      DATE OF PROCEDURE:  08/23/2022                                     OPERATIVE REPORT         PREOPERATIVE DIAGNOSIS:  Left knee osteoarthritis.      POSTOPERATIVE DIAGNOSIS:  Left knee osteoarthritis.      FINDINGS:  The patient was noted to have complete loss of cartilage and   bone-on-bone arthritis with associated osteophytes in the medial and patellofemoral compartments of   the knee with a significant synovitis and associated effusion.  The patient had failed months of conservative treatment including medications, injection therapy, activity modification.     PROCEDURE:  Left total knee replacement.      COMPONENTS USED:  DePuy Attune FB CR MS knee   system, a size 8 femur, 9 tibia, size 5 mm CR MS AOX insert, and 41 anatomic patellar   button.      SURGEON:  Madlyn Frankel. Charlann Boxer, M.D.      ASSISTANT:  Rosalene Billings, PA-C.      ANESTHESIA:  Regional and Spinal.      SPECIMENS:  None.      COMPLICATION:  None.      DRAINS:  None.  EBL: <200 cc      TOURNIQUET TIME:   Total Tourniquet Time Documented: Thigh (Left) - 42 minutes Total: Thigh (Left) - 42 minutes  .      The patient was stable to the recovery room.      INDICATION FOR PROCEDURE:  Travis Myers is a 67 y.o. male patient of   mine.  The patient had been seen, evaluated, and treated for months conservatively in the   office with medication, activity modification, and injections.  The patient had   radiographic changes of bone-on-bone arthritis with endplate sclerosis and osteophytes noted.  Based on the radiographic changes and failed conservative measures, the patient   decided to proceed with definitive treatment, total knee replacement.  Risks of infection, DVT, component failure, need for revision  surgery, neurovascular injury were reviewed in the office setting.  The postop course was reviewed stressing the efforts to maximize post-operative satisfaction and function.  Consent was obtained for benefit of pain   relief.      PROCEDURE IN DETAIL:  The patient was brought to the operative theater.   Once adequate anesthesia, preoperative antibiotics, 2 gm of Ancef,1 gm of Tranexamic Acid, and 10 mg of Decadron administered, the patient was positioned supine with a left thigh tourniquet placed.  The  left lower extremity was prepped and draped in sterile fashion.  A time-   out was performed identifying the patient, planned procedure, and the appropriate extremity.      The left lower extremity was placed in the [facility] leg holder.  The leg was   exsanguinated, tourniquet elevated to 250 mmHg.  A midline incision was  made followed by median parapatellar arthrotomy.  Following initial   exposure, attention was first directed to the patella.  Precut   measurement was noted to be 26-27 mm.  I resected down to 14 mm and used a   41 anatomic patellar button to restore patellar height as well as cover the cut surface.      The lug holes were drilled and a metal shim was placed to protect the   patella from retractors and saw blade during the procedure.      At this point, attention was now directed to the femur.  The femoral   canal was opened with a drill, irrigated to try to prevent fat emboli.  An   intramedullary rod was passed at 5 degrees valgus, 10 mm of bone was   resected off the distal femur.  Following this resection, the tibia was   subluxated anteriorly.  Using the extramedullary guide, 2 mm of bone was resected off   the proximal medial tibia.  We confirmed the gap would be   stable medially and laterally with a size 5 spacer block as well as confirmed that the tibial cut was perpendicular in the coronal plane, checking with an alignment rod.      Once this was done, I sized  the femur to be a size 8 in the anterior-   posterior dimension, chose a standard component based on medial and   lateral dimension.  The size 8 rotation block was then pinned in   position anterior referenced using the C-clamp to set rotation.  The   anterior, posterior, and  chamfer cuts were made without difficulty nor   notching making certain that I was along the anterior cortex to help   with flexion gap stability.      The final box cut was made off the lateral aspect of distal femur.      At this point, the tibia was sized to be a size 9.  The size 9 tray was   then pinned in position through the medial third of the tubercle,   drilled, and keel punched.  Trial reduction was now carried with a 8 femur,  9 tibia, a size 5 mm CR MS insert, and the 41 anatomic patella botton.  The knee was brought to full extension with good flexion stability with the patella   tracking through the trochlea without application of pressure.  Given   all these findings the trial components removed.  Final components were   opened and cement was mixed.  The knee was irrigated with normal saline solution and pulse lavage.  The synovial lining was   then injected with 30 cc of 0.25% Marcaine with epinephrine, 1 cc of Toradol and 30 cc of NS for a total of 61 cc.     Final implants were then cemented onto cleaned and dried cut surfaces of bone with the knee brought to extension with a size 5 mm CR MS trial insert.      Once the cement had fully cured, excess cement was removed   throughout the knee.  I confirmed that I was satisfied with the range of   motion and stability, and the final size 5 mm CR MS AOX insert was chosen.  It was   placed into the knee.      The tourniquet had been let down at 42 minutes.  No significant   hemostasis was required.  The extensor mechanism was then reapproximated  using #1 Vicryl and #1 Stratafix sutures with the knee   in flexion.  The   remaining wound was closed with  2-0 Vicryl and running 4-0 Monocryl.   The knee was cleaned, dried, dressed sterilely using Dermabond and   Aquacel dressing.  The patient was then   brought to recovery room in stable condition, tolerating the procedure   well.   Please note that Physician Assistant, Rosalene Billings, PA-C was present for the entirety of the case, and was utilized for pre-operative positioning, peri-operative retractor management, general facilitation of the procedure and for primary wound closure at the end of the case.              Madlyn Frankel Charlann Boxer, M.D.    08/23/2022 8:55 AM

## 2022-08-23 NOTE — Interval H&P Note (Signed)
History and Physical Interval Note:  08/23/2022 7:06 AM  Travis Myers  has presented today for surgery, with the diagnosis of Left knee osteoarthritis.  The various methods of treatment have been discussed with the patient and family. After consideration of risks, benefits and other options for treatment, the patient has consented to  Procedure(s): TOTAL KNEE ARTHROPLASTY (Left) as a surgical intervention.  The patient's history has been reviewed, patient examined, no change in status, stable for surgery.  I have reviewed the patient's chart and labs.  Questions were answered to the patient's satisfaction.     Shelda Pal

## 2022-08-24 DIAGNOSIS — R7303 Prediabetes: Secondary | ICD-10-CM | POA: Diagnosis present

## 2022-08-24 DIAGNOSIS — I10 Essential (primary) hypertension: Secondary | ICD-10-CM | POA: Diagnosis present

## 2022-08-24 DIAGNOSIS — Z96651 Presence of right artificial knee joint: Secondary | ICD-10-CM | POA: Diagnosis present

## 2022-08-24 DIAGNOSIS — M1712 Unilateral primary osteoarthritis, left knee: Secondary | ICD-10-CM | POA: Diagnosis present

## 2022-08-24 DIAGNOSIS — M659 Synovitis and tenosynovitis, unspecified: Secondary | ICD-10-CM | POA: Diagnosis present

## 2022-08-24 DIAGNOSIS — E669 Obesity, unspecified: Secondary | ICD-10-CM | POA: Diagnosis present

## 2022-08-24 DIAGNOSIS — Z6839 Body mass index (BMI) 39.0-39.9, adult: Secondary | ICD-10-CM | POA: Diagnosis not present

## 2022-08-24 DIAGNOSIS — Z825 Family history of asthma and other chronic lower respiratory diseases: Secondary | ICD-10-CM | POA: Diagnosis not present

## 2022-08-24 DIAGNOSIS — E785 Hyperlipidemia, unspecified: Secondary | ICD-10-CM | POA: Diagnosis present

## 2022-08-24 DIAGNOSIS — Z880 Allergy status to penicillin: Secondary | ICD-10-CM | POA: Diagnosis not present

## 2022-08-24 DIAGNOSIS — Z87891 Personal history of nicotine dependence: Secondary | ICD-10-CM | POA: Diagnosis not present

## 2022-08-24 DIAGNOSIS — Z881 Allergy status to other antibiotic agents status: Secondary | ICD-10-CM | POA: Diagnosis not present

## 2022-08-24 DIAGNOSIS — Z96643 Presence of artificial hip joint, bilateral: Secondary | ICD-10-CM | POA: Diagnosis present

## 2022-08-24 DIAGNOSIS — Z888 Allergy status to other drugs, medicaments and biological substances status: Secondary | ICD-10-CM | POA: Diagnosis not present

## 2022-08-24 DIAGNOSIS — I890 Lymphedema, not elsewhere classified: Secondary | ICD-10-CM | POA: Diagnosis present

## 2022-08-24 DIAGNOSIS — Z8249 Family history of ischemic heart disease and other diseases of the circulatory system: Secondary | ICD-10-CM | POA: Diagnosis not present

## 2022-08-24 DIAGNOSIS — Z833 Family history of diabetes mellitus: Secondary | ICD-10-CM | POA: Diagnosis not present

## 2022-08-24 LAB — CBC
HCT: 35.7 % — ABNORMAL LOW (ref 39.0–52.0)
Hemoglobin: 11.4 g/dL — ABNORMAL LOW (ref 13.0–17.0)
MCH: 32.1 pg (ref 26.0–34.0)
MCHC: 31.9 g/dL (ref 30.0–36.0)
MCV: 100.6 fL — ABNORMAL HIGH (ref 80.0–100.0)
Platelets: 109 10*3/uL — ABNORMAL LOW (ref 150–400)
RBC: 3.55 MIL/uL — ABNORMAL LOW (ref 4.22–5.81)
RDW: 12.9 % (ref 11.5–15.5)
WBC: 14.5 10*3/uL — ABNORMAL HIGH (ref 4.0–10.5)
nRBC: 0 % (ref 0.0–0.2)

## 2022-08-24 LAB — BASIC METABOLIC PANEL
Anion gap: 8 (ref 5–15)
BUN: 22 mg/dL (ref 8–23)
CO2: 22 mmol/L (ref 22–32)
Calcium: 8.3 mg/dL — ABNORMAL LOW (ref 8.9–10.3)
Chloride: 104 mmol/L (ref 98–111)
Creatinine, Ser: 1 mg/dL (ref 0.61–1.24)
GFR, Estimated: >60 mL/min (ref >60)
Glucose, Bld: 133 mg/dL — ABNORMAL HIGH (ref 70–99)
Potassium: 4.3 mmol/L (ref 3.5–5.1)
Sodium: 134 mmol/L — ABNORMAL LOW (ref 135–145)

## 2022-08-24 MED ORDER — CEFADROXIL 500 MG PO CAPS: 500.0000 mg | ORAL_CAPSULE | Freq: Two times a day (BID) | ORAL | 0 refills | Status: AC

## 2022-08-24 MED ORDER — TRANEXAMIC ACID 650 MG PO TABS
1950.0000 mg | ORAL_TABLET | Freq: Every day | ORAL | Status: DC
  Administered 2022-08-24 – 2022-08-25 (×2): 1950 mg via ORAL
  Filled 2022-08-24 (×2): qty 3

## 2022-08-24 MED ORDER — ASPIRIN 81 MG PO CHEW: 81.0000 mg | CHEWABLE_TABLET | Freq: Two times a day (BID) | ORAL | 0 refills | Status: AC

## 2022-08-24 MED ORDER — POLYETHYLENE GLYCOL 3350 17 G PO PACK: 17.0000 g | PACK | Freq: Two times a day (BID) | ORAL | 0 refills | Status: DC

## 2022-08-24 MED ORDER — SENNA 8.6 MG PO TABS: 2.0000 | ORAL_TABLET | Freq: Every day | ORAL | 0 refills | Status: AC

## 2022-08-24 MED ORDER — METHOCARBAMOL 500 MG PO TABS: 500.0000 mg | ORAL_TABLET | Freq: Four times a day (QID) | ORAL | 2 refills | Status: DC | PRN

## 2022-08-24 NOTE — Progress Notes (Signed)
Physical Therapy Treatment Patient Details Name: Travis Myers MRN: [id number] DOB: 04-13-1955 Today's Date: 08/24/2022   History of Present Illness 67 y.o. male s/p L TKA on 08/23/2022 due to failure of conservative measures. Pt  PMH includes but is not limited to: HTN, HLD, pre-diabetes, sleep apnea, AAA, meniscal tear Lt knee, R THA (2016), L THA (2011), lyphademia, lumbar laminectomy/decompression (2018), R TKA (2022) and R TKA revision (09/2021).    PT Comments  Pt is progressing well with mobility, he ambulated 13' with RW, no loss of balance, distance limited by L knee pain. Reviewed TKA HEP, pt demonstrates good understanding. Attempted stairs, however pt was unable to ascend a step 2* pain with weightbearing on LLE. Will plan to see patient for a second visit this afternoon and will assess readiness for DC at that time.     If plan is discharge home, recommend the following: A lot of help with bathing/dressing/bathroom;Assistance with cooking/housework;Assist for transportation;Help with stairs or ramp for entrance;A little help with walking and/or transfers   Can travel by private vehicle        Equipment Recommendations  None recommended by PT    Recommendations for Other Services       Precautions / Restrictions Precautions Precautions: Knee;Fall Precaution Booklet Issued: Yes (comment) Precaution Comments: reviewed no pillow under knee Restrictions Weight Bearing Restrictions: No LLE Weight Bearing: Weight bearing as tolerated     Mobility  Bed Mobility Overal bed mobility: Needs Assistance Bed Mobility: Supine to Sit     Supine to sit: HOB elevated, Used rails, Modified independent (Device/Increase time)     General bed mobility comments: pt used his cane as a LLE lifter    Transfers Overall transfer level: Needs assistance Equipment used: Rolling walker (2 wheels) Transfers: Sit to/from Stand Sit to Stand: From elevated surface, Contact guard  assist           General transfer comment: VCs hand placement    Ambulation/Gait Ambulation/Gait assistance: Contact guard assist Gait Distance (Feet): 80 Feet Assistive device: Rolling walker (2 wheels) Gait Pattern/deviations: Step-to pattern Gait velocity: decr     General Gait Details: no loss of balance, distance limited by pain   Stairs Stairs: Yes       General stair comments: attempted forwards with R rail and cane, pt unable to ascend step 2* pain with weightbearing on LLE   Wheelchair Mobility     Tilt Bed    Modified Rankin (Stroke Patients Only)       Balance Overall balance assessment: Needs assistance Sitting-balance support: Feet supported Sitting balance-Leahy Scale: Good     Standing balance support: Bilateral upper extremity supported, During functional activity, Reliant on assistive device for balance Standing balance-Leahy Scale: Poor                              Cognition Arousal: Alert Behavior During Therapy: WFL for tasks assessed/performed Overall Cognitive Status: Within Functional Limits for tasks assessed                                          Exercises Total Joint Exercises Ankle Circles/Pumps: AROM, Both, 10 reps, Supine Quad Sets: AROM, Both, 5 reps, Supine Short Arc Quad: AROM, Left, 5 reps, Supine Heel Slides: AAROM, Left, 5 reps, Supine Hip ABduction/ADduction: AAROM, Left, 5 reps,  Supine Long Arc Quad: AROM, Left, 5 reps, Seated Knee Flexion: AAROM, Left, 5 reps, Seated Goniometric ROM: ~5-50* AAROM L knee    General Comments        Pertinent Vitals/Pain Pain Assessment Pain Score: 8  Pain Location: L knee Pain Descriptors / Indicators: Throbbing, Sore Pain Intervention(s): Limited activity within patient's tolerance, Monitored during session, Premedicated before session, Patient requesting pain meds-RN notified, RN gave pain meds during session, Ice applied    Home Living                           Prior Function            PT Goals (current goals can now be found in the care plan section) Acute Rehab PT Goals Patient Stated Goal: walk without pain and [place] PT Goal Formulation: With patient Time For Goal Achievement: 09/06/22 Potential to Achieve Goals: Good Progress towards PT goals: Progressing toward goals    Frequency    7X/week      PT Plan      Co-evaluation              AM-PAC PT "6 Clicks" Mobility   Outcome Measure  Help needed turning from your back to your side while in a flat bed without using bedrails?: A Little Help needed moving from lying on your back to sitting on the side of a flat bed without using bedrails?: A Little Help needed moving to and from a bed to a chair (including a wheelchair)?: A Little Help needed standing up from a chair using your arms (e.g., wheelchair or bedside chair)?: A Little Help needed to walk in hospital room?: A Little Help needed climbing 3-5 steps with a railing? : Total 6 Click Score: 16    End of Session Equipment Utilized During Treatment: Gait belt Activity Tolerance: Patient limited by pain Patient left: with call bell/phone within reach;Other (comment) (on bedside commode) Nurse Communication: Mobility status;Other (comment) (pt requesting topical cream for a rash) PT Visit Diagnosis: Unsteadiness on feet (R26.81);Other abnormalities of gait and mobility (R26.89);Muscle weakness (generalized) (M62.81);Difficulty in walking, not elsewhere classified (R26.2);Pain Pain - Right/Left: Left Pain - part of body: Knee;Leg     Time: 8413-2440 PT Time Calculation (min) (ACUTE ONLY): 65 min  Charges:    $Gait Training: 23-37 mins $Therapeutic Exercise: 8-22 mins $Therapeutic Activity: 8-22 mins PT General Charges $$ ACUTE PT VISIT: 1 Visit                     Ralene Bathe Kistler PT 08/24/2022  Acute Rehabilitation  Services  Office [phone]

## 2022-08-24 NOTE — TOC Transition Note (Signed)
Transition of Care [facility]) - CM/SW Discharge Note  Patient Details  Name: LOGAN BRANDFORD MRN: [id number] Date of Birth: 1955-05-06  Transition of Care [facility]) CM/SW Contact:  Ewing Schlein, LCSW Phone Number: 08/24/2022, 12:09 PM  Clinical Narrative: Patient is expected to discharge home after working with PT. CSW met with patient to confirm discharge plan. Patient will go home with OPPT at [facility]. Patient has a rolling walker and BSC at home, so there are no DME needs at this time. TOC signing off.   Final next level of care: OP Rehab Barriers to Discharge: No Barriers Identified  Patient Goals and CMS Choice Choice offered to / list presented to : NA  Discharge Plan and Services Additional resources added to the After Visit Summary for           DME Arranged: N/A DME Agency: NA  Social Determinants of Health (SDOH) Interventions SDOH Screenings   Food Insecurity: No Food Insecurity (08/23/2022)  Housing: Low Risk  (08/23/2022)  Transportation Needs: No Transportation Needs (08/23/2022)  Utilities: Not At Risk (08/23/2022)  Depression (PHQ2-9): Low Risk  (09/28/2021)  Social Connections: Unknown (05/23/2021)   Received from [facility]  Tobacco Use: Medium Risk (08/23/2022)   Readmission Risk Interventions    10/01/2021   10:37 AM 07/23/2021    1:22 PM  Readmission Risk Prevention Plan  Post Dischage Appt Complete Complete  Medication Screening Complete Complete  Transportation Screening Complete Complete

## 2022-08-24 NOTE — Progress Notes (Addendum)
Subjective: 1 Day Post-Op Procedure(s) (LRB): TOTAL KNEE ARTHROPLASTY (Left) Patient reports pain as moderate.   Patient seen in rounds by Dr. Charlann Boxer. Patient is well, and has had no acute complaints or problems. No acute events overnight. Foley catheter removed. Patient did not tolerate pain yesterday secondary to pain.  We will start therapy today.   Objective: Vital signs in last 24 hours: Temp:  [97.7 F (36.5 C)-98.9 F (37.2 C)] 98 F (36.7 C) (08/14 0610) Pulse Rate:  [51-71] 51 (08/14 0610) Resp:  [12-20] 18 (08/14 0610) BP: (135-168)/(74-91) 155/78 (08/14 0610) SpO2:  [94 %-99 %] 99 % (08/14 0610)  Intake/Output from previous day:  Intake/Output Summary (Last 24 hours) at 08/24/2022 0816 Last data filed at 08/24/2022 0600 Gross per 24 hour  Intake 3774.43 ml  Output 4725 ml  Net -950.57 ml     Intake/Output this shift: No intake/output data recorded.  Labs: Recent Labs    08/24/22 0409  HGB 11.4*   Recent Labs    08/24/22 0409  WBC 14.5*  RBC 3.55*  HCT 35.7*  PLT 109*   Recent Labs    08/24/22 0409  NA 134*  K 4.3  CL 104  CO2 22  BUN 22  CREATININE 1.00  GLUCOSE 133*  CALCIUM 8.3*   No results for input(s): "LABPT", "INR" in the last 72 hours.  Exam: General - Patient is Alert and Oriented Extremity - Neurologically intact Sensation intact distally Intact pulses distally Dorsiflexion/Plantar flexion intact Dressing - dressing C/D/I Motor Function - intact, moving foot and toes well on exam.   Past Medical History:  Diagnosis Date   Acute meniscal tear of knee left   Anemia    Aneurysm, ascending aorta (HCC) 4.8cm per ct 2011/ CARDIOLOGSIT--  DR [name]  LOV  10-07-2011  NOTE W/ CHART AND REQUEST LEXISCAN MYOVIEW AND ECHO TO BE FAXED.   ASYMPTOMATIC   Heart murmur MILD-- ASYMPTOMATIC   Hyperlipidemia    Hypertension    Lymph edema    bilateral legs- stockings on both legs   OA (osteoarthritis) of knee left   OSA (obstructive  sleep apnea) NON-TOLERANT CPAP   no cpap PT REPORTS NEVER FINISHED TEST    Pre-diabetes    Sleep apnea    Tinnitus of both ears     Assessment/Plan: 1 Day Post-Op Procedure(s) (LRB): TOTAL KNEE ARTHROPLASTY (Left) Principal Problem:   S/P total knee arthroplasty, left  Estimated body mass index is 39.32 kg/m as calculated from the following:   Height as of this encounter: 6\' 4"  (1.93 m).   Weight as of this encounter: 146.5 kg. Advance diet Up with therapy  Anticipated LOS equal to or greater than 2 midnights due to - Age 69 and older with one or more of the following:  - Obesity  - Expected need for hospital services (PT, OT, Nursing) required for safe  discharge  - Anticipated need for postoperative skilled nursing care or inpatient rehab  - Active co-morbidities: None OR   - Unanticipated findings during/Post Surgery: Slow post-op progression: GI, pain control, mobility  - Patient is a high risk of re-admission due to: None   DVT Prophylaxis - Aspirin Weight bearing as tolerated.  Hgb stable at 11.4 this AM.  Plan is to go Home after hospital stay.   Up with PT today.  Unlikely to go home today secondary to severe pain and underlying lymphedema which affect his mobility.  May discharge home once meeting goals with PT.  Patient is scheduled for OPPT. Follow up in the office in 2 weeks.   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   Rosalene Billings, PA-C Orthopedic Surgery [phone] 08/24/2022, 8:16 AM

## 2022-08-24 NOTE — Plan of Care (Signed)

## 2022-08-24 NOTE — Care Management Obs Status (Signed)
MEDICARE OBSERVATION STATUS NOTIFICATION   Patient Details  Name: Travis Myers MRN: [id number] Date of Birth: 1955/10/03   Medicare Observation Status Notification Given:  Yes    Ewing Schlein, LCSW 08/24/2022, 10:54 AM

## 2022-08-24 NOTE — Progress Notes (Signed)
Physical Therapy Treatment Patient Details Name: Travis Myers MRN: [id number] DOB: 04-Jul-1955 Today's Date: 08/24/2022   History of Present Illness 67 y.o. male s/p L TKA on 08/23/2022 due to failure of conservative measures. Pt  PMH includes but is not limited to: HTN, HLD, pre-diabetes, sleep apnea, AAA, meniscal tear Lt knee, R THA (2016), L THA (2011), lyphademia, lumbar laminectomy/decompression (2018), R TKA (2022) and R TKA revision (09/2021).    PT Comments  Pt ambulated 110' with RW, no loss of balance. Stair training with pt and spouse, both forwards and backwards techniques were performed. Pt/spouse felt more comfortable with backwards technique. Reviewed HEP and knee precautions (no pillow under knee). Pt reports he'd feel more comfortable staying 1 more night.     If plan is discharge home, recommend the following: A lot of help with bathing/dressing/bathroom;Assistance with cooking/housework;Assist for transportation;Help with stairs or ramp for entrance;A little help with walking and/or transfers   Can travel by private vehicle        Equipment Recommendations  None recommended by PT    Recommendations for Other Services       Precautions / Restrictions Precautions Precautions: Knee;Fall Precaution Booklet Issued: Yes (comment) Precaution Comments: reviewed no pillow under knee Restrictions Weight Bearing Restrictions: No LLE Weight Bearing: Weight bearing as tolerated     Mobility  Bed Mobility Overal bed mobility: Needs Assistance Bed Mobility: Supine to Sit     Supine to sit: HOB elevated, Used rails, Modified independent (Device/Increase time) Sit to supine: Mod assist, HOB elevated, Used rails   General bed mobility comments: pt used his cane as a LLE lifter    Transfers Overall transfer level: Needs assistance Equipment used: Rolling walker (2 wheels) Transfers: Sit to/from Stand Sit to Stand: From elevated surface, Contact guard assist            General transfer comment: VCs hand placement    Ambulation/Gait Ambulation/Gait assistance: Contact guard assist Gait Distance (Feet): 110 Feet Assistive device: Rolling walker (2 wheels) Gait Pattern/deviations: Step-to pattern Gait velocity: decr     General Gait Details: no loss of balance, distance limited by pain   Stairs Stairs: Yes   Stair Management: No rails, Backwards, With walker Number of Stairs: 3 General stair comments: pt performed forwards technique with R rail and L cane but felt unsteady, so then performed backwards technique with RW and felt more steady. Wife present   Wheelchair Mobility     Tilt Bed    Modified Rankin (Stroke Patients Only)       Balance Overall balance assessment: Needs assistance Sitting-balance support: Feet supported Sitting balance-Leahy Scale: Good     Standing balance support: Bilateral upper extremity supported, During functional activity, Reliant on assistive device for balance Standing balance-Leahy Scale: Poor                              Cognition Arousal: Alert Behavior During Therapy: WFL for tasks assessed/performed Overall Cognitive Status: Within Functional Limits for tasks assessed                                          Exercises Total Joint Exercises Ankle Circles/Pumps: AROM, Both, 10 reps, Supine Quad Sets: AROM, Both, 5 reps, Supine Short Arc Quad: AROM, Left, 5 reps, Supine Heel Slides: AAROM, Left, 5 reps, Supine  Hip ABduction/ADduction: AAROM, Left, 5 reps, Supine Long Arc Quad: AROM, Left, Seated, 10 reps Knee Flexion: AAROM, Left, Seated, 10 reps Goniometric ROM: ~5-60* AAROM L knee    General Comments        Pertinent Vitals/Pain Pain Assessment Pain Score: 6  Pain Location: L knee Pain Descriptors / Indicators: Throbbing, Sore Pain Intervention(s): Limited activity within patient's tolerance, Monitored during session, Premedicated before  session, Ice applied    Home Living                          Prior Function            PT Goals (current goals can now be found in the care plan section) Acute Rehab PT Goals Patient Stated Goal: walk without pain and [place] PT Goal Formulation: With patient/family Time For Goal Achievement: 09/06/22 Potential to Achieve Goals: Good Progress towards PT goals: Progressing toward goals    Frequency    7X/week      PT Plan      Co-evaluation              AM-PAC PT "6 Clicks" Mobility   Outcome Measure  Help needed turning from your back to your side while in a flat bed without using bedrails?: A Little Help needed moving from lying on your back to sitting on the side of a flat bed without using bedrails?: A Little Help needed moving to and from a bed to a chair (including a wheelchair)?: A Little Help needed standing up from a chair using your arms (e.g., wheelchair or bedside chair)?: A Little Help needed to walk in hospital room?: A Little Help needed climbing 3-5 steps with a railing? : A Little 6 Click Score: 18    End of Session Equipment Utilized During Treatment: Gait belt Activity Tolerance: Patient limited by pain Patient left: with call bell/phone within reach;in bed;with family/visitor present Nurse Communication: Mobility status PT Visit Diagnosis: Unsteadiness on feet (R26.81);Other abnormalities of gait and mobility (R26.89);Muscle weakness (generalized) (M62.81);Difficulty in walking, not elsewhere classified (R26.2);Pain Pain - Right/Left: Left Pain - part of body: Knee;Leg     Time: 0272-5366 PT Time Calculation (min) (ACUTE ONLY): 51 min  Charges:    $Gait Training: 23-37 mins $Therapeutic Exercise: 8-22 mins PT General Charges $$ ACUTE PT VISIT: 1 Visit                     Ralene Bathe Kistler PT 08/24/2022  Acute Rehabilitation Services  Office [phone]

## 2022-08-25 ENCOUNTER — Encounter (HOSPITAL_COMMUNITY): Payer: Self-pay | Admitting: Orthopedic Surgery

## 2022-08-25 LAB — CBC
HCT: 34.2 % — ABNORMAL LOW (ref 39.0–52.0)
Hemoglobin: 10.8 g/dL — ABNORMAL LOW (ref 13.0–17.0)
MCH: 31.1 pg (ref 26.0–34.0)
MCHC: 31.6 g/dL (ref 30.0–36.0)
MCV: 98.6 fL (ref 80.0–100.0)
Platelets: 146 10*3/uL — ABNORMAL LOW (ref 150–400)
RBC: 3.47 MIL/uL — ABNORMAL LOW (ref 4.22–5.81)
RDW: 13.2 % (ref 11.5–15.5)
WBC: 14.2 10*3/uL — ABNORMAL HIGH (ref 4.0–10.5)
nRBC: 0 % (ref 0.0–0.2)

## 2022-08-25 MED ORDER — TIZANIDINE HCL 2 MG PO TABS: 2.0000 mg | ORAL_TABLET | Freq: Three times a day (TID) | ORAL | 2 refills | Status: DC | PRN

## 2022-08-25 MED ORDER — TIZANIDINE HCL 4 MG PO TABS: 2.0000 mg | ORAL_TABLET | Freq: Three times a day (TID) | ORAL | Status: DC | PRN

## 2022-08-25 NOTE — Progress Notes (Signed)
   Subjective: 2 Days Post-Op Procedure(s) (LRB): TOTAL KNEE ARTHROPLASTY (Left) Patient reports pain as mild.   Patient seen in rounds for Dr. Charlann Boxer. Patient is well, and has had no acute complaints or problems. No acute events overnight. Patient ambulated 110 feet with PT. He did bump his foot down onto the floor and has had increased knee pain.  We will continue therapy today.   Objective: Vital signs in last 24 hours: Temp:  [97.8 F (36.6 C)-98.8 F (37.1 C)] 98.8 F (37.1 C) (08/15 0515) Pulse Rate:  [58-70] 70 (08/15 0515) Resp:  [16-17] 16 (08/15 0515) BP: (131-167)/(74-87) 150/87 (08/15 0515) SpO2:  [97 %-99 %] 99 % (08/15 0515)  Intake/Output from previous day:  Intake/Output Summary (Last 24 hours) at 08/25/2022 0929 Last data filed at 08/25/2022 0600 Gross per 24 hour  Intake 2510.87 ml  Output 1700 ml  Net 810.87 ml     Intake/Output this shift: No intake/output data recorded.  Labs: Recent Labs    08/24/22 0409 08/25/22 0731  HGB 11.4* 10.8*   Recent Labs    08/24/22 0409 08/25/22 0731  WBC 14.5* 14.2*  RBC 3.55* 3.47*  HCT 35.7* 34.2*  PLT 109* 146*   Recent Labs    08/24/22 0409  NA 134*  K 4.3  CL 104  CO2 22  BUN 22  CREATININE 1.00  GLUCOSE 133*  CALCIUM 8.3*   No results for input(s): "LABPT", "INR" in the last 72 hours.  Exam: General - Patient is Alert and Oriented Extremity - Neurologically intact Sensation intact distally Intact pulses distally Dorsiflexion/Plantar flexion intact Dressing - dressing C/D/I Motor Function - intact, moving foot and toes well on exam.   Past Medical History:  Diagnosis Date   Acute meniscal tear of knee left   Anemia    Aneurysm, ascending aorta (HCC) 4.8cm per ct 2011/ CARDIOLOGSIT--  DR [name]  LOV  10-07-2011  NOTE W/ CHART AND REQUEST LEXISCAN MYOVIEW AND ECHO TO BE FAXED.   ASYMPTOMATIC   Heart murmur MILD-- ASYMPTOMATIC   Hyperlipidemia    Hypertension    Lymph edema     bilateral legs- stockings on both legs   OA (osteoarthritis) of knee left   OSA (obstructive sleep apnea) NON-TOLERANT CPAP   no cpap PT REPORTS NEVER FINISHED TEST    Pre-diabetes    Sleep apnea    Tinnitus of both ears     Assessment/Plan: 2 Days Post-Op Procedure(s) (LRB): TOTAL KNEE ARTHROPLASTY (Left) Principal Problem:   S/P total knee arthroplasty, left  Estimated body mass index is 39.32 kg/m as calculated from the following:   Height as of this encounter: 6\' 4"  (1.93 m).   Weight as of this encounter: 146.5 kg. Advance diet Up with therapy D/C IV fluids    DVT Prophylaxis - Aspirin Weight bearing as tolerated.   Plan is to go Home after hospital stay. Plan for discharge today following 1-2 sessions of PT as long as they are meeting their goals. Patient is scheduled for OPPT. Follow up in the office in 2 weeks.   Rosalene Billings, PA-C Orthopedic Surgery [phone] 08/25/2022, 9:29 AM

## 2022-08-25 NOTE — Progress Notes (Signed)
Physical Therapy Treatment Patient Details Name: Travis Myers MRN: [id number] DOB: 03-16-1955 Today's Date: 08/25/2022   History of Present Illness 67 y.o. male s/p L TKA on 08/23/2022 due to failure of conservative measures. Pt  PMH includes but is not limited to: HTN, HLD, pre-diabetes, sleep apnea, AAA, meniscal tear Lt knee, R THA (2016), L THA (2011), lyphademia, lumbar laminectomy/decompression (2018), R TKA (2022) and R TKA revision (09/2021).    PT Comments  Pt reports he bumped the lateral part of his left knee while getting up to use the urinal last night. He reports increased pain, decreased tolerance of weight bearing LLE. He ambulated 69' and ascended/descended 3 stairs backwards. Pt verbalized understanding of TKA HEP. He is ready to DC home from a PT standpoint.     If plan is discharge home, recommend the following: A lot of help with bathing/dressing/bathroom;Assistance with cooking/housework;Assist for transportation;Help with stairs or ramp for entrance;A little help with walking and/or transfers   Can travel by private vehicle        Equipment Recommendations  None recommended by PT    Recommendations for Other Services       Precautions / Restrictions Precautions Precautions: Knee;Fall Precaution Booklet Issued: Yes (comment) Precaution Comments: reviewed no pillow under knee Restrictions Weight Bearing Restrictions: No LLE Weight Bearing: Weight bearing as tolerated     Mobility  Bed Mobility Overal bed mobility: Needs Assistance Bed Mobility: Supine to Sit       Sit to supine: HOB elevated, Used rails, Min assist   General bed mobility comments: pt used his cane as a LLE lifter, min A for LLE into bed    Transfers Overall transfer level: Needs assistance Equipment used: Rolling walker (2 wheels) Transfers: Sit to/from Stand Sit to Stand: From elevated surface, Supervision           General transfer comment: supervision for safety     Ambulation/Gait Ambulation/Gait assistance: Contact guard assist Gait Distance (Feet): 80 Feet Assistive device: Rolling walker (2 wheels) Gait Pattern/deviations: Step-to pattern, Decreased weight shift to left, Antalgic Gait velocity: decr     General Gait Details: no loss of balance, distance limited by pain, knee more painful today bc he bumped his knee while getting up to use the bathroom last night, pt notes decr tolerance of weight bearing LLE this morning compared to yesterday afternoon.   Stairs   Stairs assistance: Min assist Stair Management: No rails, Backwards, With walker Number of Stairs: 3 General stair comments: min A to manage RW, VCs sequencing   Wheelchair Mobility     Tilt Bed    Modified Rankin (Stroke Patients Only)       Balance Overall balance assessment: Needs assistance Sitting-balance support: Feet supported Sitting balance-Leahy Scale: Good     Standing balance support: Bilateral upper extremity supported, During functional activity, Reliant on assistive device for balance Standing balance-Leahy Scale: Poor                              Cognition Arousal: Alert Behavior During Therapy: WFL for tasks assessed/performed Overall Cognitive Status: Within Functional Limits for tasks assessed                                          Exercises      General Comments  Pertinent Vitals/Pain Pain Assessment Pain Score: 9  Pain Location: L knee Pain Descriptors / Indicators: Throbbing, Sore Pain Intervention(s): Limited activity within patient's tolerance, Monitored during session, Premedicated before session, Ice applied    Home Living                          Prior Function            PT Goals (current goals can now be found in the care plan section) Acute Rehab PT Goals Patient Stated Goal: walk without pain and [place] PT Goal Formulation: With  patient/family Time For Goal Achievement: 09/06/22 Potential to Achieve Goals: Good Progress towards PT goals: Progressing toward goals    Frequency    7X/week      PT Plan      Co-evaluation              AM-PAC PT "6 Clicks" Mobility   Outcome Measure  Help needed turning from your back to your side while in a flat bed without using bedrails?: A Little Help needed moving from lying on your back to sitting on the side of a flat bed without using bedrails?: A Little Help needed moving to and from a bed to a chair (including a wheelchair)?: A Little Help needed standing up from a chair using your arms (e.g., wheelchair or bedside chair)?: A Little Help needed to walk in hospital room?: A Little Help needed climbing 3-5 steps with a railing? : A Little 6 Click Score: 18    End of Session Equipment Utilized During Treatment: Gait belt Activity Tolerance: Patient limited by pain Patient left: with call bell/phone within reach;in bed Nurse Communication: Mobility status PT Visit Diagnosis: Unsteadiness on feet (R26.81);Other abnormalities of gait and mobility (R26.89);Muscle weakness (generalized) (M62.81);Difficulty in walking, not elsewhere classified (R26.2);Pain Pain - Right/Left: Left Pain - part of body: Knee     Time: 1610-9604 PT Time Calculation (min) (ACUTE ONLY): 24 min  Charges:    $Gait Training: 8-22 mins $Therapeutic Exercise: 8-22 mins PT General Charges $$ ACUTE PT VISIT: 1 Visit                     Ralene Bathe Kistler PT 08/25/2022  Acute Rehabilitation Services  Office [phone]

## 2022-09-03 NOTE — Discharge Summary (Signed)
Patient ID: Travis Myers MRN: [id number] DOB/AGE: November 23, 1955 67 y.o.  Admit date: 08/23/2022 Discharge date: 08/25/2022  Admission Diagnoses:  Left knee osteoarthritis  Discharge Diagnoses:  Principal Problem:   S/P total knee arthroplasty, left   Past Medical History:  Diagnosis Date   Acute meniscal tear of knee left   Anemia    Aneurysm, ascending aorta (HCC) 4.8cm per ct 2011/ CARDIOLOGSIT--  DR [name]  LOV  10-07-2011  NOTE W/ CHART AND REQUEST LEXISCAN MYOVIEW AND ECHO TO BE FAXED.   ASYMPTOMATIC   Heart murmur MILD-- ASYMPTOMATIC   Hyperlipidemia    Hypertension    Lymph edema    bilateral legs- stockings on both legs   OA (osteoarthritis) of knee left   OSA (obstructive sleep apnea) NON-TOLERANT CPAP   no cpap PT REPORTS NEVER FINISHED TEST    Pre-diabetes    Sleep apnea    Tinnitus of both ears     Surgeries: Procedure(s): TOTAL KNEE ARTHROPLASTY on 08/23/2022   Consultants:   Discharged Condition: Improved  Hospital Course: Travis Myers is an 67 y.o. male who was admitted 08/23/2022 for operative treatment ofS/P total knee arthroplasty, left. Patient has severe unremitting pain that affects sleep, daily activities, and work/hobbies. After pre-op clearance the patient was taken to the operating room on 08/23/2022 and underwent  Procedure(s): TOTAL KNEE ARTHROPLASTY.    Patient was given perioperative antibiotics:  Anti-infectives (From admission, onward)    Start     Dose/Rate Route Frequency Ordered Stop   08/24/22 0000  cefadroxil (DURICEF) 500 MG capsule        500 mg Oral 2 times daily 08/24/22 0820 09/07/22 2359   08/23/22 1330  ceFAZolin (ANCEF) IVPB 2g/100 mL premix        2 g 200 mL/hr over 30 Minutes Intravenous Every 6 hours 08/23/22 1232 08/23/22 1920   08/23/22 0600  ceFAZolin (ANCEF) IVPB 3g/100 mL premix        3 g 200 mL/hr over 30 Minutes Intravenous On call to O.R. 08/23/22 0535 08/23/22 0739        Patient was  given sequential compression devices, early ambulation, and chemoprophylaxis to prevent DVT. Patient worked with PT and was meeting their goals regarding safe ambulation and transfers.  Patient benefited maximally from hospital stay and there were no complications.    Recent vital signs: No data found.   Recent laboratory studies: No results for input(s): "WBC", "HGB", "HCT", "PLT", "NA", "K", "CL", "CO2", "BUN", "CREATININE", "GLUCOSE", "INR", "CALCIUM" in the last 72 hours.  Invalid input(s): "PT", "2"   Discharge Medications:   Allergies as of 08/25/2022       Reactions   Ciprofloxacin Other (See Comments)   Unknown   Penicillins Other (See Comments)   Childhood reaction- not recalled Tolerated Cephalosporin Date: 08/24/22.   Quinolones Other (See Comments)   Patient was warned about not using Cipro and similar antibiotics. Recent studies have raised concern that fluoroquinolone antibiotics could be associated with an increased risk of aortic aneurysm Fluoroquinolones have non-antimicrobial properties that might jeopardise the integrity of the extracellular matrix of the vascular wall In a  propensity score matched cohort study in [place], there was a 66% increased rate of aortic aneurysm or dissection associated with oral fluoroquinolone use, compared wit        Medication List     STOP taking these medications    aspirin EC 81 MG tablet Replaced by: aspirin 81 MG chewable tablet  diclofenac 75 MG EC tablet Commonly known as: VOLTAREN   methocarbamol 500 MG tablet Commonly known as: ROBAXIN       TAKE these medications    acetaminophen 500 MG tablet Commonly known as: TYLENOL Take 500 mg by mouth 3 (three) times daily as needed for moderate pain. Takes with tramadol   aspirin 81 MG chewable tablet Chew 1 tablet (81 mg total) by mouth 2 (two) times daily for 28 days. Replaces: aspirin EC 81 MG tablet   atorvastatin 40 MG tablet Commonly known as:  LIPITOR TAKE 1 TABLET BY MOUTH EVERY DAY   cefadroxil 500 MG capsule Commonly known as: DURICEF Take 1 capsule (500 mg total) by mouth 2 (two) times daily for 14 days.   cyanocobalamin 1000 MCG tablet Commonly known as: VITAMIN B12 Take 1,000 mcg by mouth daily.   furosemide 20 MG tablet Commonly known as: LASIX Take 20-40 mg by mouth See admin instructions. Take 40 mg in the morning and 20 mg in the afternoon   LUBRICATING EYE DROPS OP Place 1 drop into both eyes daily as needed (dry eyes).   meclizine 25 MG tablet Commonly known as: ANTIVERT Take 25 mg by mouth 3 (three) times daily as needed for dizziness.   polyethylene glycol 17 g packet Commonly known as: MIRALAX / GLYCOLAX Take 17 g by mouth 2 (two) times daily.   pregabalin 75 MG capsule Commonly known as: LYRICA Take 75 mg by mouth 2 (two) times daily.   senna 8.6 MG Tabs tablet Commonly known as: SENOKOT Take 2 tablets (17.2 mg total) by mouth at bedtime for 14 days.   tiZANidine 2 MG tablet Commonly known as: ZANAFLEX Take 1-2 tablets (2-4 mg total) by mouth every 8 (eight) hours as needed for muscle spasms.   traMADol 50 MG tablet Commonly known as: ULTRAM Take 100 mg by mouth 3 (three) times daily as needed for severe pain.   triamcinolone cream 0.1 % Commonly known as: KENALOG Apply 1 application  topically 3 (three) times daily.   valsartan 320 MG tablet Commonly known as: DIOVAN Take 320 mg by mouth daily.   vitamin C 1000 MG tablet Take 1,000 mg by mouth daily.               Discharge Care Instructions  (From admission, onward)           Start     Ordered   08/25/22 0000  Change dressing       Comments: Maintain surgical dressing until follow up in the clinic. If the edges start to pull up, may reinforce with tape. If the dressing is no longer working, may remove and cover with gauze and tape, but must keep the area dry and clean.  Call with any questions or concerns.   08/25/22  0934            Diagnostic Studies: No results found.  Disposition: Discharge disposition: 01-Home or Self Care       Discharge Instructions     Call MD / Call 911   Complete by: As directed    If you experience chest pain or shortness of breath, CALL 911 and be transported to the hospital emergency room.  If you develope a fever above 101 F, pus (white drainage) or increased drainage or redness at the wound, or calf pain, call your surgeon's office.   Change dressing   Complete by: As directed    Maintain surgical dressing until follow up in  the clinic. If the edges start to pull up, may reinforce with tape. If the dressing is no longer working, may remove and cover with gauze and tape, but must keep the area dry and clean.  Call with any questions or concerns.   Constipation Prevention   Complete by: As directed    Drink plenty of fluids.  Prune juice may be helpful.  You may use a stool softener, such as Colace (over the counter) 100 mg twice a day.  Use MiraLax (over the counter) for constipation as needed.   Diet - low sodium heart healthy   Complete by: As directed    Increase activity slowly as tolerated   Complete by: As directed    Weight bearing as tolerated with assist device (walker, cane, etc) as directed, use it as long as suggested by your surgeon or therapist, typically at least 4-6 weeks.   Post-operative opioid taper instructions:   Complete by: As directed    POST-OPERATIVE OPIOID TAPER INSTRUCTIONS: It is important to wean off of your opioid medication as soon as possible. If you do not need pain medication after your surgery it is ok to stop day one. Opioids include: Codeine, Hydrocodone(Norco, Vicodin), Oxycodone(Percocet, oxycontin) and hydromorphone amongst others.  Long term and even short term use of opiods can cause: Increased pain response Dependence Constipation Depression Respiratory depression And more.  Withdrawal symptoms can include Flu  like symptoms Nausea, vomiting And more Techniques to manage these symptoms Hydrate well Eat regular healthy meals Stay active Use relaxation techniques(deep breathing, meditating, yoga) Do Not substitute Alcohol to help with tapering If you have been on opioids for less than two weeks and do not have pain than it is ok to stop all together.  Plan to wean off of opioids This plan should start within one week post op of your joint replacement. Maintain the same interval or time between taking each dose and first decrease the dose.  Cut the total daily intake of opioids by one tablet each day Next start to increase the time between doses. The last dose that should be eliminated is the evening dose.      TED hose   Complete by: As directed    Use stockings (TED hose) for 2 weeks on both leg(s).  You may remove them at night for sleeping.        Follow-up Information     Durene Romans, MD. Schedule an appointment as soon as possible for a visit in 2 week(s).   Specialty: Orthopedic Surgery Contact information: [address] [place] [address] [phone]                  Signed: Cassandria Anger 09/03/2022, 9:26 AM

## 2022-09-15 ENCOUNTER — Ambulatory Visit: Payer: Medicare Other | Admitting: Vascular Surgery

## 2022-09-21 ENCOUNTER — Telehealth: Payer: Self-pay

## 2022-09-21 ENCOUNTER — Other Ambulatory Visit: Payer: Self-pay

## 2022-09-21 DIAGNOSIS — I83811 Varicose veins of right lower extremities with pain: Secondary | ICD-10-CM

## 2022-09-21 DIAGNOSIS — I872 Venous insufficiency (chronic) (peripheral): Secondary | ICD-10-CM

## 2022-09-21 DIAGNOSIS — I890 Lymphedema, not elsewhere classified: Secondary | ICD-10-CM

## 2022-09-21 NOTE — Telephone Encounter (Signed)
Received a call from patient requesting to reschedule ligation and stripping surgery in January because he is still recovering from knee surgery. Procedure rescheduled for 01/26/23. Instructions reviewed and patient denied any new expected changes to insurance for the new year.

## 2022-10-31 ENCOUNTER — Ambulatory Visit (INDEPENDENT_AMBULATORY_CARE_PROVIDER_SITE_OTHER): Payer: Medicare Other | Admitting: Podiatry

## 2022-10-31 DIAGNOSIS — L84 Corns and callosities: Secondary | ICD-10-CM

## 2022-10-31 DIAGNOSIS — B351 Tinea unguium: Secondary | ICD-10-CM | POA: Diagnosis not present

## 2022-10-31 DIAGNOSIS — I739 Peripheral vascular disease, unspecified: Secondary | ICD-10-CM

## 2022-10-31 NOTE — Progress Notes (Signed)
Subjective:  Patient ID: Travis Myers, male    DOB: 10/27/1955,  MRN: [id number]  Patient notes nails are thick and elongated, causing pain in shoe gear when ambulating.  He also has painful calluses.  Notes that he is due for venous surgery in January and.  She just had a knee arthroscopy and states that he is recovering very well.  PCP is Lonie Peak, PA-C.  Last seen approximately 2 months ago  Past Medical History:  Diagnosis Date   Acute meniscal tear of knee left   Anemia    Aneurysm, ascending aorta (HCC) 4.8cm per ct 2011/ CARDIOLOGSIT--  DR [name]  LOV  10-07-2011  NOTE W/ CHART AND REQUEST LEXISCAN MYOVIEW AND ECHO TO BE FAXED.   ASYMPTOMATIC   Heart murmur MILD-- ASYMPTOMATIC   Hyperlipidemia    Hypertension    Lymph edema    bilateral legs- stockings on both legs   OA (osteoarthritis) of knee left   OSA (obstructive sleep apnea) NON-TOLERANT CPAP   no cpap PT REPORTS NEVER FINISHED TEST    Pre-diabetes    Sleep apnea    Tinnitus of both ears     Allergies  Allergen Reactions   Ciprofloxacin Other (See Comments)    Unknown   Penicillins Other (See Comments)    Childhood reaction- not recalled Tolerated Cephalosporin Date: 08/24/22.     Quinolones Other (See Comments)    Patient was warned about not using Cipro and similar antibiotics. Recent studies have raised concern that fluoroquinolone antibiotics could be associated with an increased risk of aortic aneurysm Fluoroquinolones have non-antimicrobial properties that might jeopardise the integrity of the extracellular matrix of the vascular wall In a  propensity score matched cohort study in [place], there was a 66% increased rate of aortic aneurysm or dissection associated with oral fluoroquinolone use, compared wit    Objective:  Gevena Barre II is a pleasant 67 y.o. male in NAD. AAO x 3.  Vascular Examination: Patient has palpable DP pulse, absent PT pulse bilateral.  Delayed  capillary refill bilateral toes.  Sparse digital hair bilateral.  Proximal to distal cooling WNL bilateral.  +1 pitting edema bilateral.  Hemosiderin deposition in the legs bilateral.  Dermatological Examination: Interspaces are clear with no open lesions noted bilateral.  Skin is shiny and atrophic bilateral.  Nails are 3-56mm thick, with yellowish/brown discoloration, subungual debris and distal onycholysis x10.  There is pain with compression of nails x10.  There are hyperkeratotic lesions noted bilateral submet 5 and bilateral first interspace sulcus on the plantar aspect of the forefoot..  Patient qualifies for at-risk foot care because of PVD.  Assessment/Plan: 1. Dermatophytosis of nail   2. PVD (peripheral vascular disease) (HCC)   3. Callus of foot    Mycotic nails x10 were sharply debrided with sterile nail nippers and power debriding burr to decrease bulk and length.  Hyperkeratotic lesions x 4 were shaved with #312 blade.   Return in about 3 months (around 01/31/2023) for RFC.   Clerance Lav, DPM, FACFAS [facility]     [address] [place] [address]                Office [phone]  Fax [phone]  [phone]

## 2022-11-15 ENCOUNTER — Ambulatory Visit: Payer: Medicare Other | Admitting: Podiatry

## 2023-01-12 ENCOUNTER — Other Ambulatory Visit: Payer: Self-pay

## 2023-01-12 DIAGNOSIS — I872 Venous insufficiency (chronic) (peripheral): Secondary | ICD-10-CM

## 2023-01-18 ENCOUNTER — Ambulatory Visit (HOSPITAL_COMMUNITY)
Admission: RE | Admit: 2023-01-18 | Discharge: 2023-01-18 | Disposition: A | Discharge status: Home / Self Care | Payer: Medicare Other | Source: Ambulatory Visit | Attending: Vascular Surgery | Admitting: Vascular Surgery

## 2023-01-18 ENCOUNTER — Ambulatory Visit (INDEPENDENT_AMBULATORY_CARE_PROVIDER_SITE_OTHER): Payer: Medicare Other | Admitting: Vascular Surgery

## 2023-01-18 ENCOUNTER — Encounter: Payer: Self-pay | Admitting: Vascular Surgery

## 2023-01-18 VITALS — BP 147/80 | HR 63 | Temp 98.3°F | Resp 20 | Ht 76.0 in | Wt 321.0 lb

## 2023-01-18 DIAGNOSIS — I872 Venous insufficiency (chronic) (peripheral): Secondary | ICD-10-CM | POA: Insufficient documentation

## 2023-01-18 DIAGNOSIS — I890 Lymphedema, not elsewhere classified: Secondary | ICD-10-CM

## 2023-01-18 DIAGNOSIS — M7989 Other specified soft tissue disorders: Secondary | ICD-10-CM

## 2023-01-18 NOTE — Progress Notes (Signed)
 Patient ID: Travis Myers, male   DOB: 29-Jul-1955, 68 y.o.   MRN: [id number]  Reason for Consult: Follow-up   Referred by Montey Lot, PA-C  Subjective:     HPI:  Travis Myers is a 68 y.o. male with extensive history of right lower extremity pain and swelling.  He also has a history of left greater saphenous vein stripping many years ago that he states was performed from the ankle to the groin.  He does have skin changes and swelling of the left but is not nearly as bad as the right.  He also has a history of knee replacement on the right which required total of 3 operations most recently in October of last year.  States that since having his knee complications he has gained 60 pounds back up to 320 and notices that his swelling is increased.  He does attempt to elevate his legs when he is recumbent but he has difficulty with laying flat due to vertigo.  He has also only intermittently use his lymphedema pumps.  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  Currently states that he has decreased activity due to continued pain and swelling of the right lower extremity but no current tissue loss or ulceration.  Past Medical History:  Diagnosis Date   Acute meniscal tear of knee left   Anemia    Aneurysm, ascending aorta (HCC) 4.8cm per ct 2011/ CARDIOLOGSIT--  DR MAYE  LOV  10-07-2011  NOTE W/ CHART AND REQUEST LEXISCAN  MYOVIEW AND ECHO TO BE FAXED.   ASYMPTOMATIC   Heart murmur MILD-- ASYMPTOMATIC   Hyperlipidemia    Hypertension    Lymph edema    bilateral legs- stockings on both legs   OA (osteoarthritis) of knee left   OSA (obstructive sleep apnea) NON-TOLERANT CPAP   no cpap PT REPORTS NEVER FINISHED TEST    Pre-diabetes    Sleep apnea    Tinnitus of both ears    Family History  Problem Relation Age of Onset   Diabetes Mother    Cancer Mother     Heart failure Other    Diabetes Other    Cancer Other    COPD Other    Colon cancer Neg Hx    Stomach cancer Neg Hx    Past Surgical History:  Procedure Laterality Date   BILATERAL IINGUINAL HERNIA REPAIR  1963   DOPPLER ECHOCARDIOGRAPHY  10/18/2011   EF >55 %   ENDOVENOUS ABLATION SAPHENOUS VEIN W/ LASER Right 12/30/2020   endovenous laser ablation right greater saphenous vein by Medford Blade MD   EXCISIONAL TOTAL KNEE ARTHROPLASTY WITH ANTIBIOTIC SPACERS Right 07/22/2021   Procedure: EXCISIONAL TOTAL KNEE ARTHROPLASTY WITH ANTIBIOTIC SPACERS;  Surgeon: Ernie Cough, MD;  Location: [facility];  Service: Orthopedics;  Laterality: Right;   INCISION AND DRAINAGE OF WOUND Right 04/27/2021   Procedure: IRRIGATION AND DEBRIDEMENT OF HEMATOMA AND ABCESS ,ASPIRATE RIGHT KNEE;  Surgeon: Gerome Charleston, MD;  Location: [facility];  Service: Orthopedics;  Laterality: Right;  REQUEST 3:30PM START TIME   LUMBAR LAMINECTOMY/DECOMPRESSION MICRODISCECTOMY N/A 02/24/2016   Procedure: Bilateral Decompression L4-5, Discectomy L4-5 ;  Surgeon: Reyes Billing, MD;  Location: [facility];  Service: Orthopedics;  Laterality: N/A;   NM MYOCAR PERF WALL MOTION  10/13/2011   EF 49 %,low risk study   REIMPLANTATION OF TOTAL KNEE Right 09/30/2021   Procedure:  REIMPLANTATION / REVISON OF TOTAL KNEE;  Surgeon: Ernie Cough, MD;  Location: [facility];  Service: Orthopedics;  Laterality: Right;  150   right elbow surgery     ulnar nerve transposiiton   SHOULDER ARTHROSCOPY Left 2022   TONSILLECTOMY  1965   TORN MENISCUS X 3 IN KNEES SURGERY      LEFT    TOTAL HIP ARTHROPLASTY Left 06/18/2009   OA LEFT HIP   TOTAL HIP ARTHROPLASTY Right 03/11/2014   Procedure: RIGHT TOTAL HIP ARTHROPLASTY ANTERIOR APPROACH;  Surgeon: Cough JONETTA Ernie, MD;  Location: [facility];  Service: Orthopedics;  Laterality: Right;   TOTAL KNEE ARTHROPLASTY Right 02/26/2020   Procedure: TOTAL KNEE ARTHROPLASTY;  Surgeon: Gerome Charleston, MD;  Location: [facility];   Service: Orthopedics;  Laterality: Right;  adductor canal   TOTAL KNEE ARTHROPLASTY Left 08/23/2022   Procedure: TOTAL KNEE ARTHROPLASTY;  Surgeon: Ernie Cough, MD;  Location: [facility];  Service: Orthopedics;  Laterality: Left;   TRANSTHORACIC ECHOCARDIOGRAM  09/11/2012   EF 55 to 60 %,mild aortic valve regurg,mild mitral valve regurg,lf and rt atriums mildly dilated   VARICOSE VEIN SURGERY Left 1980   and 1994    Short Social History:  Social History   Tobacco Use   Smoking status: Never   Smokeless tobacco: Former    Quit date: 09/11/1988  Substance Use Topics   Alcohol use: Yes    Alcohol/week: 1.0 standard drink of alcohol    Types: 1 Standard drinks or equivalent per week    Comment: RARE    Allergies  Allergen Reactions   Ciprofloxacin Other (See Comments)    Unknown   Penicillins Other (See Comments)    Childhood reaction- not recalled Tolerated Cephalosporin Date: 08/24/22.     Quinolones Other (See Comments)    Patient was warned about not using Cipro and similar antibiotics. Recent studies have raised concern that fluoroquinolone antibiotics could be associated with an increased risk of aortic aneurysm Fluoroquinolones have non-antimicrobial properties that might jeopardise the integrity of the extracellular matrix of the vascular wall In a  propensity score matched cohort study in [place], there was a 66% increased rate of aortic aneurysm or dissection associated with oral fluoroquinolone use, compared wit    Current Outpatient Medications  Medication Sig Dispense Refill   acetaminophen  (TYLENOL ) 500 MG tablet Take 500 mg by mouth 3 (three) times daily as needed for moderate pain. Takes with tramadol      Ascorbic Acid (VITAMIN C ) 1000 MG tablet Take 1,000 mg by mouth daily.     atorvastatin  (LIPITOR) 40 MG tablet TAKE 1 TABLET BY MOUTH EVERY DAY 90 tablet 3   cyanocobalamin (VITAMIN B12) 1000 MCG tablet Take 1,000 mcg by mouth daily.     furosemide  (LASIX ) 20 MG  tablet Take 20-40 mg by mouth See admin instructions. Take 40 mg in the morning and 20 mg in the afternoon  5   meclizine  (ANTIVERT ) 25 MG tablet Take 25 mg by mouth 3 (three) times daily as needed for dizziness.     Polyethyl Glycol-Propyl Glycol (LUBRICATING EYE DROPS OP) Place 1 drop into both eyes daily as needed (dry eyes).     polyethylene glycol (MIRALAX  / GLYCOLAX ) 17 g packet Take 17 g by mouth 2 (two) times daily. 14 each 0   pregabalin  (LYRICA ) 75 MG capsule Take 75 mg by mouth 2 (two) times daily.     tiZANidine  (ZANAFLEX ) 2 MG tablet Take 1-2 tablets (2-4 mg total) by mouth every 8 (  eight) hours as needed for muscle spasms. 30 tablet 2   traMADol  (ULTRAM ) 50 MG tablet Take 100 mg by mouth 3 (three) times daily as needed for severe pain.     triamcinolone  (KENALOG ) 0.1 % Apply 1 application  topically 3 (three) times daily.     valsartan (DIOVAN) 320 MG tablet Take 320 mg by mouth daily.     No current facility-administered medications for this visit.    Review of Systems  Constitutional:  Constitutional negative. HENT: HENT negative.  Eyes: Eyes negative.  Respiratory: Respiratory negative.  Cardiovascular: Positive for leg swelling.  GI: Gastrointestinal negative.  Musculoskeletal: Positive for gait problem and leg pain.  Neurological: Positive for numbness.  Hematologic: Hematologic/lymphatic negative.  Psychiatric: Psychiatric negative.        Objective:  Objective   Vitals:   01/18/23 1101  BP: (!) 147/80  Pulse: 63  Resp: 20  Temp: 98.3 F (36.8 C)  SpO2: 93%  Weight: (!) 321 lb (145.6 kg)  Height: 6' 4 (1.93 m)   Body mass index is 39.07 kg/m.  Physical Exam Constitutional:      Appearance: He is obese.  HENT:     Head: Normocephalic.     Nose: Nose normal.  Eyes:     Pupils: Pupils are equal, round, and reactive to light.  Cardiovascular:     Rate and Rhythm: Normal rate.     Pulses: Normal pulses.  Abdominal:     General: Abdomen is flat.   Musculoskeletal:     Cervical back: Normal range of motion.     Right lower leg: Edema present.     Left lower leg: Edema present.  Skin:    Capillary Refill: Capillary refill takes less than 2 seconds.     Comments: Hemosiderin deposition right greater than left lower extremities  Neurological:     General: No focal deficit present.     Mental Status: He is alert.  Psychiatric:        Mood and Affect: Mood normal.     Data: Venous Reflux Times  +------------------+---------+------+-----------+------------+-------------  -+  RIGHT            Reflux NoRefluxReflux TimeDiameter cmsComments                                     Yes                                          +------------------+---------+------+-----------+------------+-------------  -+  CFV                        yes   >1 second                              +------------------+---------+------+-----------+------------+-------------  -+  FV mid                      yes   >1 second                              +------------------+---------+------+-----------+------------+-------------  -+  Popliteal  yes   >1 second                              +------------------+---------+------+-----------+------------+-------------  -+  GSV at [facility]                  yes    >500 ms      1.00                     +------------------+---------+------+-----------+------------+-------------  -+  GSV prox thigh              yes    >500 ms      0.75                     +------------------+---------+------+-----------+------------+-------------  -+  GSV mid thigh               yes    >500 ms      0.68                     +------------------+---------+------+-----------+------------+-------------  -+  GSV dist thigh              yes    >500 ms      0.67    out of  fascia   +------------------+---------+------+-----------+------------+-------------  -+   GSV at knee                 yes    >500 ms      0.68    out of  fascia   +------------------+---------+------+-----------+------------+-------------  -+  SSV Pop Fossa                                           not  identified  +------------------+---------+------+-----------+------------+-------------  -+  anterior accessoryno                            0.37                     +------------------+---------+------+-----------+------------+-------------  -+         Summary:  Right:  - No evidence of deep vein thrombosis from the common femoral through the  popliteal veins.  - No evidence of superficial venous thrombosis.  - Significant deep vein reflux noted.  - The great saphenous vein is not competent.  - The small saphenous vein was not identified.         Assessment/Plan:    68 year old male with history as above with very large great saphenous vein evaluated at the bedside today which is noted to be out of the fascia distally in the upper thigh and enters the fascia and measures approximately 1 cm throughout the upper part of the leg and is noted to be refluxing.  He also has deep venous reflux.  Given his ongoing swelling and tightness of his right knee from his most recent knee surgery recommended no intervention at this time.  She has previously discussed great saphenous vein stripping with Dr. Eliza but I am unsure how much it actually get this time.  I recommended weight loss as his greatest therapy to prevent swelling and he also needs to continue to use his bio tab lymphedema pumps and elevate his legs is  much as possible.  I will have him follow-up in 9 months which will be 1 year out from his most recent knee surgery and if he is having persistent pain and swelling despite weight loss and improved knee mobility we can consider stripping of the right greater saphenous vein in the operating room given that he has failed ablation in the past.  All of  his questions were answered to his satisfaction today happy to see him at any time but will schedule 69-month follow-up without studies.     Penne Lonni Colorado MD [facility]

## 2023-01-26 ENCOUNTER — Ambulatory Visit (HOSPITAL_COMMUNITY): Admission: RE | Admit: 2023-01-26 | Payer: Medicare Other | Source: Home / Self Care | Admitting: Vascular Surgery

## 2023-01-26 ENCOUNTER — Encounter (HOSPITAL_COMMUNITY): Admission: RE | Payer: Self-pay | Source: Home / Self Care

## 2023-01-26 SURGERY — LIGATION AND STRIPPING, VARICOSE VEIN
Anesthesia: Choice | Laterality: Right

## 2023-01-30 NOTE — Progress Notes (Signed)
Surgery orders requested via Epic inbox with Adolphus Birchwood, PA-C.

## 2023-01-31 ENCOUNTER — Ambulatory Visit (INDEPENDENT_AMBULATORY_CARE_PROVIDER_SITE_OTHER): Payer: Medicare Other | Admitting: Podiatry

## 2023-01-31 ENCOUNTER — Encounter: Payer: Self-pay | Admitting: Podiatry

## 2023-01-31 VITALS — Ht 76.0 in | Wt 321.0 lb

## 2023-01-31 DIAGNOSIS — M79675 Pain in left toe(s): Secondary | ICD-10-CM | POA: Diagnosis not present

## 2023-01-31 DIAGNOSIS — M79674 Pain in right toe(s): Secondary | ICD-10-CM | POA: Diagnosis not present

## 2023-01-31 DIAGNOSIS — I739 Peripheral vascular disease, unspecified: Secondary | ICD-10-CM

## 2023-01-31 DIAGNOSIS — L84 Corns and callosities: Secondary | ICD-10-CM | POA: Diagnosis not present

## 2023-01-31 DIAGNOSIS — B351 Tinea unguium: Secondary | ICD-10-CM | POA: Diagnosis not present

## 2023-01-31 NOTE — Progress Notes (Signed)
Subjective:  Patient ID: Travis Myers, male    DOB: September 16, 1955,  MRN: [id number]  Travis Myers presents to clinic today for:  Chief Complaint  Patient presents with   Nail Problem    Patient is here for routine foot care   Patient notes nails are thick and elongated, causing pain in shoe gear when ambulating.  He has painful calluses bilateral submet 5 and right plantar 5th met. Base.  He stated he's scheduled to undergo a shoulder replacement within the next month.  PCP is Travis Peak, PA-C.  Seen around December 19, 2022  Past Medical History:  Diagnosis Date   Acute meniscal tear of knee left   Anemia    Aneurysm, ascending aorta (HCC) 4.8cm per ct 2011/ CARDIOLOGSIT--  DR [name]  LOV  10-07-2011  NOTE W/ CHART AND REQUEST LEXISCAN MYOVIEW AND ECHO TO BE FAXED.   ASYMPTOMATIC   Heart murmur MILD-- ASYMPTOMATIC   Hyperlipidemia    Hypertension    Lymph edema    bilateral legs- stockings on both legs   OA (osteoarthritis) of knee left   OSA (obstructive sleep apnea) NON-TOLERANT CPAP   no cpap PT REPORTS NEVER FINISHED TEST    Pre-diabetes    Sleep apnea    Tinnitus of both ears     Allergies  Allergen Reactions   Ciprofloxacin Other (See Comments)    Unknown   Penicillins Other (See Comments)    Childhood reaction- not recalled Tolerated Cephalosporin Date: 08/24/22.     Quinolones Other (See Comments)    Patient was warned about not using Cipro and similar antibiotics. Recent studies have raised concern that fluoroquinolone antibiotics could be associated with an increased risk of aortic aneurysm Fluoroquinolones have non-antimicrobial properties that might jeopardise the integrity of the extracellular matrix of the vascular wall In a  propensity score matched cohort study in [place], there was a 66% increased rate of aortic aneurysm or dissection associated with oral fluoroquinolone use, compared wit   Objective:  Travis Barre  Myers is a pleasant 68 y.o. male in NAD. AAO x 3.  Vascular Examination: Patient has palpable DP pulse, absent PT pulse bilateral.  Delayed capillary refill bilateral toes.  Sparse digital hair bilateral.  Proximal to distal cooling WNL bilateral.    Dermatological Examination: Interspaces are clear with no open lesions noted bilateral.  Skin is shiny and atrophic bilateral.  Nails are 3-85mm thick, with yellowish/brown discoloration, subungual debris and distal onycholysis x10.  There is pain with compression of nails x10.  There are hyperkeratotic lesions noted bilateral submet 5 and right plantar aspect of 5th metatarsal base .  Patient qualifies for at-risk foot care because of PVD .  Assessment/Plan: 1. Pain due to onychomycosis of toenails of both feet   2. Callus of foot   3. PVD (peripheral vascular disease) (HCC)     Mycotic nails x10 were sharply debrided with sterile nail nippers and power debriding burr to decrease bulk and length.  Hyperkeratotic lesions x3 on plantar feet (not toes) were shaved with #312 blade.  Recommend 40% urea + 2% salicyclic acid creame at bedtime for calluses.   Return in about 9 weeks (around 04/04/2023) for RFC.   Travis Myers, DPM, FACFAS [facility]     [address]  [place], [place] [address]                Office [phone]  Fax [phone]

## 2023-02-02 ENCOUNTER — Other Ambulatory Visit (HOSPITAL_COMMUNITY): Payer: Self-pay

## 2023-02-02 NOTE — Progress Notes (Signed)
Second request for Pre op orders in [facility]: Left voicemail for [employer].

## 2023-02-03 ENCOUNTER — Other Ambulatory Visit (HOSPITAL_COMMUNITY): Payer: Self-pay

## 2023-02-03 NOTE — Patient Instructions (Addendum)
SURGICAL WAITING ROOM VISITATION  Patients having surgery or a procedure may have no more than 2 support people in the waiting area - these visitors may rotate.    Children under the age of 63 must have an adult with them who is not the patient.  Due to an increase in RSV and influenza rates and associated hospitalizations, children ages 2 and under may not visit patients in [facility] hospitals.  Visitors with respiratory illnesses are discouraged from visiting and should remain at home.  If the patient needs to stay at the hospital during part of their recovery, the visitor guidelines for inpatient rooms apply. Pre-op nurse will coordinate an appropriate time for 1 support person to accompany patient in pre-op.  This support person may not rotate.    Please refer to the [facility] website for the visitor guidelines for Inpatients (after your surgery is over and you are in a regular room).       Your procedure is scheduled on: 02-16-23   Report to [facility] Main Entrance    Report to admitting at     0515 AM   Call this number if you have problems the morning of surgery [phone]   Do not eat food :After Midnight.   After Midnight you may have the following liquids until _0430 _____ AM DAY OF SURGERY   then nothing by mouth  Water Non-Citrus Juices (without pulp, NO RED-Apple, White grape, White cranberry) Black Coffee (NO MILK/CREAM OR CREAMERS, sugar ok)  Clear Tea (NO MILK/CREAM OR CREAMERS, sugar ok) regular and decaf                             Plain Jell-O (NO RED)                                           Fruit ices (not with fruit pulp, NO RED)                                     Popsicles (NO RED)                                                               Sports drinks like Gatorade (NO RED)                    The day of surgery:  Drink ONE (1) Pre-Surgery G2 by 0430  AM the morning of surgery. Drink in one sitting. Do not sip.  This drink was  given to you during your hospital  pre-op appointment visit. Nothing else to drink after completing the  Pre-Surgery G2.          If you have questions, please contact your surgeon's office.   FOLLOW BOWEL PREP AND ANY ADDITIONAL PRE OP INSTRUCTIONS YOU RECEIVED FROM YOUR SURGEON'S OFFICE!!!     Oral Hygiene is also important to reduce your risk of infection.  Remember - BRUSH YOUR TEETH THE MORNING OF SURGERY WITH YOUR REGULAR TOOTHPASTE  DENTURES WILL BE REMOVED PRIOR TO SURGERY PLEASE DO NOT APPLY "Poly grip" OR ADHESIVES!!!   Do NOT smoke after Midnight   Stop all vitamins and herbal supplements 7 days before surgery.   Take these medicines the morning of surgery with A SIP OF WATER: Tramadol and tylenol if needed, , lyrica, methocarbamol if needed, atorvastatin,    Bring CPAP mask and tubing day of surgery.                              You may not have any metal on your body including hair pins, jewelry, and body piercing             Do not wear , lotions, powders, perfumes/cologne, or deodorant                 Men may shave face and neck.   Do not bring valuables to the hospital. [facility] IS NOT             RESPONSIBLE   FOR VALUABLES.   Contacts, glasses, dentures or bridgework may not be worn into surgery.   Bring small overnight bag day of surgery.   DO NOT BRING YOUR HOME MEDICATIONS TO THE HOSPITAL. PHARMACY WILL DISPENSE MEDICATIONS LISTED ON YOUR MEDICATION LIST TO YOU DURING YOUR ADMISSION IN THE HOSPITAL!    Patients discharged on the day of surgery will not be allowed to drive home.  Someone NEEDS to stay with you for the first 24 hours after anesthesia.   Special Instructions: Bring a copy of your healthcare power of attorney and living will documents the day of surgery if you haven't scanned them before.              Please read over the following fact sheets you were given: IF YOU HAVE QUESTIONS ABOUT YOUR PRE-OP  INSTRUCTIONS PLEASE CALL [phone]   If you test positive for Covid or have been in contact with anyone that has tested positive in the last 10 days please notify you surgeon.  [facility]- Preparing for Total Shoulder Arthroplasty    Before surgery, you can play an important role. Because skin is not sterile, your skin needs to be as free of germs as possible. You can reduce the number of germs on your skin by using the following products. Benzoyl Peroxide Gel Reduces the number of germs present on the skin Applied twice a day to shoulder area starting two days before surgery    ==================================================================  Please follow these instructions carefully:  BENZOYL PEROXIDE 5% GEL  Please do not use if you have an allergy to benzoyl peroxide.   If your skin becomes reddened/irritated stop using the benzoyl peroxide.  Starting two days before surgery, apply as follows: Apply benzoyl peroxide in the morning and at night. Apply after taking a shower. If you are not taking a shower clean entire shoulder front, back, and side along with the armpit with a clean wet washcloth.  Place a quarter-sized dollop on your shoulder and rub in thoroughly, making sure to cover the front, back, and side of your shoulder, along with the armpit.   2 days before ____ AM   ____ PM              1 day before ____ AM   ____ PM  Do this twice a day for two days.  (Last application is the night before surgery, AFTER using the CHG soap as described below).  Do NOT apply benzoyl peroxide gel on the day of surgery.      Pre-operative 5 CHG Bath Instructions   You can play a key role in reducing the risk of infection after surgery. Your skin needs to be as free of germs as possible. You can reduce the number of germs on your skin by washing with CHG (chlorhexidine gluconate) soap before surgery. CHG is an antiseptic soap that kills germs and continues to  kill germs even after washing.   DO NOT use if you have an allergy to chlorhexidine/CHG or antibacterial soaps. If your skin becomes reddened or irritated, stop using the CHG and notify one of our RNs at [phone].   Please shower with the CHG soap starting 4 days before surgery using the following schedule:     Please keep in mind the following:  DO NOT shave, including legs and underarms, starting the day of your first shower.   You may shave your face at any point before/day of surgery.  Place clean sheets on your bed the day you start using CHG soap. Use a clean washcloth (not used since being washed) for each shower. DO NOT sleep with pets once you start using the CHG.   CHG Shower Instructions:  If you choose to wash your hair and private area, wash first with your normal shampoo/soap.  After you use shampoo/soap, rinse your hair and body thoroughly to remove shampoo/soap residue.  Turn the water OFF and apply about 3 tablespoons (45 ml) of CHG soap to a CLEAN washcloth.  Apply CHG soap ONLY FROM YOUR NECK DOWN TO YOUR TOES (washing for 3-5 minutes)  DO NOT use CHG soap on face, private areas, open wounds, or sores.  Pay special attention to the area where your surgery is being performed.  If you are having back surgery, having someone wash your back for you may be helpful. Wait 2 minutes after CHG soap is applied, then you may rinse off the CHG soap.  Pat dry with a clean towel  Put on clean clothes/pajamas   If you choose to wear lotion, please use ONLY the CHG-compatible lotions on the back of this paper.     Additional instructions for the day of surgery: DO NOT APPLY any lotions, deodorants, cologne, or perfumes.   Put on clean/comfortable clothes.  Brush your teeth.  Ask your nurse before applying any prescription medications to the skin.      CHG Compatible Lotions   Aveeno Moisturizing lotion  Cetaphil Moisturizing Cream  Cetaphil Moisturizing Lotion   Clairol Herbal Essence Moisturizing Lotion, Dry Skin  Clairol Herbal Essence Moisturizing Lotion, Extra Dry Skin  Clairol Herbal Essence Moisturizing Lotion, Normal Skin  Curel Age Defying Therapeutic Moisturizing Lotion with Alpha Hydroxy  Curel Extreme Care Body Lotion  Curel Soothing Hands Moisturizing Hand Lotion  Curel Therapeutic Moisturizing Cream, Fragrance-Free  Curel Therapeutic Moisturizing Lotion, Fragrance-Free  Curel Therapeutic Moisturizing Lotion, Original Formula  Eucerin Daily Replenishing Lotion  Eucerin Dry Skin Therapy Plus Alpha Hydroxy Crme  Eucerin Dry Skin Therapy Plus Alpha Hydroxy Lotion  Eucerin Original Crme  Eucerin Original Lotion  Eucerin Plus Crme Eucerin Plus Lotion  Eucerin TriLipid Replenishing Lotion  Keri Anti-Bacterial Hand Lotion  Keri Deep Conditioning Original Lotion Dry Skin Formula Softly Scented  Keri Deep Conditioning Original Lotion, Fragrance Free Sensitive Skin Formula  Keri Lotion Fast [employer] Fragrance Free Sensitive Skin Formula  Keri Lotion Fast Absorbing Softly Scented Dry Skin Formula  Keri Original Lotion  Keri Skin Renewal Lotion Keri Silky Smooth Lotion  Keri Silky Smooth Sensitive Skin Lotion  Nivea Body Creamy Conditioning Oil  Nivea Body Extra Enriched Lotion  Nivea Body Original Lotion  Nivea Body Sheer Moisturizing Lotion Nivea Crme  Nivea Skin Firming Lotion  NutraDerm 30 Skin Lotion  NutraDerm Skin Lotion  NutraDerm Therapeutic Skin Cream  NutraDerm Therapeutic Skin Lotion  ProShield Protective Hand Cream  Provon moisturizing lotion   Incentive Spirometer  An incentive spirometer is a tool that can help keep your lungs clear and active. This tool measures how well you are filling your lungs with each breath. Taking long deep breaths may help reverse or decrease the chance of developing breathing (pulmonary) problems (especially infection) following: A long period of time when you are unable to move or be  active. BEFORE THE PROCEDURE  If the spirometer includes an indicator to show your best effort, your nurse or respiratory therapist will set it to a desired goal. If possible, sit up straight or lean slightly forward. Try not to slouch. Hold the incentive spirometer in an upright position. INSTRUCTIONS FOR USE  Sit on the edge of your bed if possible, or sit up as far as you can in bed or on a chair. Hold the incentive spirometer in an upright position. Breathe out normally. Place the mouthpiece in your mouth and seal your lips tightly around it. Breathe in slowly and as deeply as possible, raising the piston or the ball toward the top of the column. Hold your breath for 3-5 seconds or for as long as possible. Allow the piston or ball to fall to the bottom of the column. Remove the mouthpiece from your mouth and breathe out normally. Rest for a few seconds and repeat Steps 1 through 7 at least 10 times every 1-2 hours when you are awake. Take your time and take a few normal breaths between deep breaths. The spirometer may include an indicator to show your best effort. Use the indicator as a goal to work toward during each repetition. After each set of 10 deep breaths, practice coughing to be sure your lungs are clear. If you have an incision (the cut made at the time of surgery), support your incision when coughing by placing a pillow or rolled up towels firmly against it. Once you are able to get out of bed, walk around indoors and cough well. You may stop using the incentive spirometer when instructed by your caregiver.  RISKS AND COMPLICATIONS Take your time so you do not get dizzy or light-headed. If you are in pain, you may need to take or ask for pain medication before doing incentive spirometry. It is harder to take a deep breath if you are having pain. AFTER USE Rest and breathe slowly and easily. It can be helpful to keep track of a log of your progress. Your caregiver can provide you  with a simple table to help with this. If you are using the spirometer at home, follow these instructions: SEEK MEDICAL CARE IF:  You are having difficultly using the spirometer. You have trouble using the spirometer as often as instructed. Your pain medication is not giving enough relief while using the spirometer. You develop fever of 100.5 F (38.1 C) or higher. SEEK IMMEDIATE MEDICAL CARE IF:  You cough up bloody sputum that had not been present  before. You develop fever of 102 F (38.9 C) or greater. You develop worsening pain at or near the incision site. MAKE SURE YOU:  Understand these instructions. Will watch your condition. Will get help right away if you are not doing well or get worse. Document Released: 05/09/2006 Document Revised: 03/21/2011 Document Reviewed: 07/10/2006 [facility] Patient Information 2014 [place], [place].   ________________________________________________________________________

## 2023-02-06 ENCOUNTER — Encounter (HOSPITAL_COMMUNITY)
Admission: RE | Admit: 2023-02-06 | Discharge: 2023-02-06 | Disposition: A | Discharge status: Home / Self Care | Payer: Medicare Other | Source: Ambulatory Visit | Attending: Orthopedic Surgery | Admitting: Orthopedic Surgery

## 2023-02-06 ENCOUNTER — Encounter (HOSPITAL_COMMUNITY): Payer: Self-pay

## 2023-02-06 ENCOUNTER — Other Ambulatory Visit: Payer: Self-pay

## 2023-02-06 DIAGNOSIS — Z01818 Encounter for other preprocedural examination: Secondary | ICD-10-CM | POA: Diagnosis present

## 2023-02-06 DIAGNOSIS — I10 Essential (primary) hypertension: Secondary | ICD-10-CM | POA: Insufficient documentation

## 2023-02-06 HISTORY — DX: Myoneural disorder, unspecified: G70.9

## 2023-02-06 LAB — BASIC METABOLIC PANEL
Anion gap: 9 (ref 5–15)
BUN: 20 mg/dL (ref 8–23)
CO2: 26 mmol/L (ref 22–32)
Calcium: 9.3 mg/dL (ref 8.9–10.3)
Chloride: 105 mmol/L (ref 98–111)
Creatinine, Ser: 0.86 mg/dL (ref 0.61–1.24)
GFR, Estimated: >60 mL/min (ref >60)
Glucose, Bld: 114 mg/dL — ABNORMAL HIGH (ref 70–99)
Potassium: 4.5 mmol/L (ref 3.5–5.1)
Sodium: 140 mmol/L (ref 135–145)

## 2023-02-06 LAB — CBC
HCT: 43.8 % (ref 39.0–52.0)
Hemoglobin: 14.4 g/dL (ref 13.0–17.0)
MCH: 31.8 pg (ref 26.0–34.0)
MCHC: 32.9 g/dL (ref 30.0–36.0)
MCV: 96.7 fL (ref 80.0–100.0)
Platelets: 173 10*3/uL (ref 150–400)
RBC: 4.53 MIL/uL (ref 4.22–5.81)
RDW: 14 % (ref 11.5–15.5)
WBC: 7.1 10*3/uL (ref 4.0–10.5)
nRBC: 0 % (ref 0.0–0.2)

## 2023-02-06 LAB — SURGICAL PCR SCREEN
MRSA, PCR: NEGATIVE
Staphylococcus aureus: NEGATIVE

## 2023-02-06 NOTE — Progress Notes (Addendum)
PCP - Lonie Peak PA-C   clearance in media 07-27-22 for a surgery with Dr. Charlann Boxer Cardiologist - Nanetta Batty, MD LOV 03-30-22 epic Tele clearance 08-05-22 in epic for a Dr. Charlann Boxer surgery  PPM/ICD -  Device Orders -  Rep Notified -   Chest x-ray -  EKG - 03-30-22 epic Stress Test -  ECHO - Has an appointment 02-07-23  Cardiac Cath -   Sleep Study -  CPAP -   Fasting Blood Sugar -  Checks Blood Sugar _____ times a day  Blood Thinner Instructions: Aspirin Instructions:81 mg asa stopped last dose 02-04-23  ERAS Protcol - PRE-SURGERY  G2-     COVID vaccine -yes  Activity--Able to complete Adl's with no CP or SOB  Anesthesia review: mild aortic stenosis, OSA no cpap, Ascending aortic aneurysm , HTN, Murmur, Pre DM  Patient denies shortness of breath, fever, cough and chest pain at PAT appointment   All instructions explained to the patient, with a verbal understanding of the material. Patient agrees to go over the instructions while at home for a better understanding. Patient also instructed to self quarantine after being tested for COVID-19. The opportunity to ask questions was provided.

## 2023-02-06 NOTE — Patient Instructions (Signed)
SURGICAL WAITING ROOM VISITATION  Patients having surgery or a procedure may have no more than 2 support people in the waiting area - these visitors may rotate.    Children under the age of 74 must have an adult with them who is not the patient.  Due to an increase in RSV and influenza rates and associated hospitalizations, children ages 60 and under may not visit patients in [facility] hospitals.  Visitors with respiratory illnesses are discouraged from visiting and should remain at home.  If the patient needs to stay at the hospital during part of their recovery, the visitor guidelines for inpatient rooms apply. Pre-op nurse will coordinate an appropriate time for 1 support person to accompany patient in pre-op.  This support person may not rotate.    Please refer to the [facility] website for the visitor guidelines for Inpatients (after your surgery is over and you are in a regular room).       Your procedure is scheduled on: 02-16-23    Report to [facility] Main Entrance    Report to admitting at       0515  AM   Call this number if you have problems the morning of surgery [phone]   Do not eat food :After Midnight.   After Midnight you may have the following liquids until _0430 _____ AM/  DAY OF SURGERY   then nothing by mouth  Water Non-Citrus Juices (without pulp, NO RED-Apple, White grape, White cranberry) Black Coffee (NO MILK/CREAM OR CREAMERS, sugar ok)  Clear Tea (NO MILK/CREAM OR CREAMERS, sugar ok) regular and decaf                             Plain Jell-O (NO RED)                                           Fruit ices (not with fruit pulp, NO RED)                                     Popsicles (NO RED)                                                               Sports drinks like Gatorade (NO RED)                    The day of surgery:  Drink ONE (1) Pre-Surgery G2 BY    0430 AM the morning of surgery. Drink in one sitting. Do not sip.  This  drink was given to you during your hospital  pre-op appointment visit. Nothing else to drink after completing the  Pre-Surgery  G2.          If you have questions, please contact your surgeon's office.   FOLLOW  ANY ADDITIONAL PRE OP INSTRUCTIONS YOU RECEIVED FROM YOUR SURGEON'S OFFICE!!!     Oral Hygiene is also important to reduce your risk of infection.  Remember - BRUSH YOUR TEETH THE MORNING OF SURGERY WITH YOUR REGULAR TOOTHPASTE  DENTURES WILL BE REMOVED PRIOR TO SURGERY PLEASE DO NOT APPLY "Poly grip" OR ADHESIVES!!!   Do NOT smoke after Midnight   Stop all vitamins and herbal supplements 7 days before surgery.   Take these medicines the morning of surgery with A SIP OF WATER: Tramadol  or tylenol if needed, lyrica, methocarbamol if needed, atorvastatin,  DO NOT TAKE ANY ORAL DIABETIC MEDICATIONS DAY OF YOUR SURGERY  Bring CPAP mask and tubing day of surgery.                              You may not have any metal on your body including hair pins, jewelry, and body piercing             Do not wear  lotions, powders, perfumes/cologne, or deodorant               Men may shave face and neck.   Do not bring valuables to the hospital. [facility] IS NOT             RESPONSIBLE   FOR VALUABLES.   Contacts, glasses, dentures or bridgework may not be worn into surgery.    DO NOT BRING YOUR HOME MEDICATIONS TO THE HOSPITAL. PHARMACY WILL DISPENSE MEDICATIONS LISTED ON YOUR MEDICATION LIST TO YOU DURING YOUR ADMISSION IN THE HOSPITAL!    Patients discharged on the day of surgery will not be allowed to drive home.  Someone NEEDS to stay with you for the first 24 hours after anesthesia.   Special Instructions: Bring a copy of your healthcare power of attorney and living will documents the day of surgery if you haven't scanned them before.              Please read over the following fact sheets you were given: IF YOU HAVE QUESTIONS ABOUT YOUR  PRE-OP INSTRUCTIONS PLEASE CALL [phone]    If you test positive for Covid or have been in contact with anyone that has tested positive in the last 10 days please notify you surgeon.    [facility]- Preparing for Total Shoulder Arthroplasty    Before surgery, you can play an important role. Because skin is not sterile, your skin needs to be as free of germs as possible. You can reduce the number of germs on your skin by using the following products. Benzoyl Peroxide Gel Reduces the number of germs present on the skin Applied twice a day to shoulder area starting two days before surgery    ==================================================================  Please follow these instructions carefully:  BENZOYL PEROXIDE 5% GEL  Please do not use if you have an allergy to benzoyl peroxide.   If your skin becomes reddened/irritated stop using the benzoyl peroxide.  Starting two days before surgery, apply as follows: Apply benzoyl peroxide in the morning and at night. Apply after taking a shower. If you are not taking a shower clean entire shoulder front, back, and side along with the armpit with a clean wet washcloth.  Place a quarter-sized dollop on your shoulder and rub in thoroughly, making sure to cover the front, back, and side of your shoulder, along with the armpit.   2 days before ____ AM   ____ PM              1 day before ____ AM   ____ PM  Do this twice a day for two days.  (Last application is the night before surgery, AFTER using the CHG soap as described below).  Do NOT apply benzoyl peroxide gel on the day of surgery.      Pre-operative 5 CHG Bath Instructions   You can play a key role in reducing the risk of infection after surgery. Your skin needs to be as free of germs as possible. You can reduce the number of germs on your skin by washing with CHG (chlorhexidine gluconate) soap before surgery. CHG is an antiseptic soap that kills germs and  continues to kill germs even after washing.   DO NOT use if you have an allergy to chlorhexidine/CHG or antibacterial soaps. If your skin becomes reddened or irritated, stop using the CHG and notify one of our RNs at [phone].   Please shower with the CHG soap starting 4 days before surgery using the following schedule:     Please keep in mind the following:  DO NOT shave, including legs and underarms, starting the day of your first shower.   You may shave your face at any point before/day of surgery.  Place clean sheets on your bed the day you start using CHG soap. Use a clean washcloth (not used since being washed) for each shower. DO NOT sleep with pets once you start using the CHG.   CHG Shower Instructions:  If you choose to wash your hair and private area, wash first with your normal shampoo/soap.  After you use shampoo/soap, rinse your hair and body thoroughly to remove shampoo/soap residue.  Turn the water OFF and apply about 3 tablespoons (45 ml) of CHG soap to a CLEAN washcloth.  Apply CHG soap ONLY FROM YOUR NECK DOWN TO YOUR TOES (washing for 3-5 minutes)  DO NOT use CHG soap on face, private areas, open wounds, or sores.  Pay special attention to the area where your surgery is being performed.  If you are having back surgery, having someone wash your back for you may be helpful. Wait 2 minutes after CHG soap is applied, then you may rinse off the CHG soap.  Pat dry with a clean towel  Put on clean clothes/pajamas   If you choose to wear lotion, please use ONLY the CHG-compatible lotions on the back of this paper.     Additional instructions for the day of surgery: DO NOT APPLY any lotions, deodorants, cologne, or perfumes.   Put on clean/comfortable clothes.  Brush your teeth.  Ask your nurse before applying any prescription medications to the skin.      CHG Compatible Lotions   Aveeno Moisturizing lotion  Cetaphil Moisturizing Cream  Cetaphil Moisturizing  Lotion  Clairol Herbal Essence Moisturizing Lotion, Dry Skin  Clairol Herbal Essence Moisturizing Lotion, Extra Dry Skin  Clairol Herbal Essence Moisturizing Lotion, Normal Skin  Curel Age Defying Therapeutic Moisturizing Lotion with Alpha Hydroxy  Curel Extreme Care Body Lotion  Curel Soothing Hands Moisturizing Hand Lotion  Curel Therapeutic Moisturizing Cream, Fragrance-Free  Curel Therapeutic Moisturizing Lotion, Fragrance-Free  Curel Therapeutic Moisturizing Lotion, Original Formula  Eucerin Daily Replenishing Lotion  Eucerin Dry Skin Therapy Plus Alpha Hydroxy Crme  Eucerin Dry Skin Therapy Plus Alpha Hydroxy Lotion  Eucerin Original Crme  Eucerin Original Lotion  Eucerin Plus Crme Eucerin Plus Lotion  Eucerin TriLipid Replenishing Lotion  Keri Anti-Bacterial Hand Lotion  Keri Deep Conditioning Original Lotion Dry Skin Formula Softly Scented  Keri Deep Conditioning Original Lotion, Fragrance Free Sensitive Skin Formula  Keri Lotion Fast [employer] Fragrance Free Sensitive Skin Formula  Keri Lotion Fast Absorbing Softly Scented Dry Skin Formula  Keri Original Lotion  Keri Skin Renewal Lotion Keri Silky Smooth Lotion  Keri Silky Smooth Sensitive Skin Lotion  Nivea Body Creamy Conditioning Oil  Nivea Body Extra Enriched Teacher, adult education Moisturizing Lotion Nivea Crme  Nivea Skin Firming Lotion  NutraDerm 30 Skin Lotion  NutraDerm Skin Lotion  NutraDerm Therapeutic Skin Cream  NutraDerm Therapeutic Skin Lotion  ProShield Protective Hand Cream   Incentive Spirometer  An incentive spirometer is a tool that can help keep your lungs clear and active. This tool measures how well you are filling your lungs with each breath. Taking long deep breaths may help reverse or decrease the chance of developing breathing (pulmonary) problems (especially infection) following: A long period of time when you are unable to move or be active. BEFORE THE  PROCEDURE  If the spirometer includes an indicator to show your best effort, your nurse or respiratory therapist will set it to a desired goal. If possible, sit up straight or lean slightly forward. Try not to slouch. Hold the incentive spirometer in an upright position. INSTRUCTIONS FOR USE  Sit on the edge of your bed if possible, or sit up as far as you can in bed or on a chair. Hold the incentive spirometer in an upright position. Breathe out normally. Place the mouthpiece in your mouth and seal your lips tightly around it. Breathe in slowly and as deeply as possible, raising the piston or the ball toward the top of the column. Hold your breath for 3-5 seconds or for as long as possible. Allow the piston or ball to fall to the bottom of the column. Remove the mouthpiece from your mouth and breathe out normally. Rest for a few seconds and repeat Steps 1 through 7 at least 10 times every 1-2 hours when you are awake. Take your time and take a few normal breaths between deep breaths. The spirometer may include an indicator to show your best effort. Use the indicator as a goal to work toward during each repetition. After each set of 10 deep breaths, practice coughing to be sure your lungs are clear. If you have an incision (the cut made at the time of surgery), support your incision when coughing by placing a pillow or rolled up towels firmly against it. Once you are able to get out of bed, walk around indoors and cough well. You may stop using the incentive spirometer when instructed by your caregiver.  RISKS AND COMPLICATIONS Take your time so you do not get dizzy or light-headed. If you are in pain, you may need to take or ask for pain medication before doing incentive spirometry. It is harder to take a deep breath if you are having pain. AFTER USE Rest and breathe slowly and easily. It can be helpful to keep track of a log of your progress. Your caregiver can provide you with a simple table  to help with this. If you are using the spirometer at home, follow these instructions: SEEK MEDICAL CARE IF:  You are having difficultly using the spirometer. You have trouble using the spirometer as often as instructed. Your pain medication is not giving enough relief while using the spirometer. You develop fever of 100.5 F (38.1 C) or higher. SEEK IMMEDIATE MEDICAL CARE IF:  You cough up bloody sputum that had not been present before. You develop fever  of 102 F (38.9 C) or greater. You develop worsening pain at or near the incision site. MAKE SURE YOU:  Understand these instructions. Will watch your condition. Will get help right away if you are not doing well or get worse. Document Released: 05/09/2006 Document Revised: 03/21/2011 Document Reviewed: 07/10/2006 [facility] Patient Information 2014 [place], [place].   ________________________________________________________________________

## 2023-02-07 ENCOUNTER — Ambulatory Visit (HOSPITAL_COMMUNITY): Payer: Medicare Other | Attending: Cardiology

## 2023-02-07 DIAGNOSIS — I10 Essential (primary) hypertension: Secondary | ICD-10-CM | POA: Diagnosis present

## 2023-02-07 LAB — ECHOCARDIOGRAM COMPLETE
AR max vel: 1.38 cm2
AV Area VTI: 1.29 cm2
AV Area mean vel: 1.28 cm2
AV Mean grad: 16 mm[Hg]
AV Peak grad: 30.3 mm[Hg]
Ao pk vel: 2.75 m/s
Area-P 1/2: 1.71 cm2
P 1/2 time: 662 ms
S' Lateral: 3.5 cm

## 2023-02-08 ENCOUNTER — Encounter (HOSPITAL_COMMUNITY): Payer: Self-pay

## 2023-02-08 NOTE — Anesthesia Preprocedure Evaluation (Signed)
Anesthesia Evaluation    Airway        Dental   Pulmonary           Cardiovascular hypertension,      Neuro/Psych    GI/Hepatic   Endo/Other    Renal/GU      Musculoskeletal   Abdominal   Peds  Hematology   Anesthesia Other Findings   Reproductive/Obstetrics                              Anesthesia Physical Anesthesia Plan  ASA:   Anesthesia Plan:    Post-op Pain Management:    Induction:   PONV Risk Score and Plan:   Airway Management Planned:   Additional Equipment:   Intra-op Plan:   Post-operative Plan:   Informed Consent:   Plan Discussed with:   Anesthesia Plan Comments: (See PAT note from 1/27 by Sherlie Ban PA-C )         Anesthesia Quick Evaluation

## 2023-02-08 NOTE — Progress Notes (Signed)
Case: [id number] Date/Time: 02/16/23 0715   Procedure: REVERSE SHOULDER ARTHROPLASTY (Right: Shoulder) - 120 min   Anesthesia type: General   Pre-op diagnosis: Right Shoulder Rotator Cuff Tear Arthropathy   Location: [facility] ROOM 06 / [facility]   Surgeons: Francena Hanly, MD       DISCUSSION: Travis Myers is a 68 yo male who presents to PAT prior to surgery above. PMH of HTN, TAA, mild AS, OSA (no CPAP), lymphedema, arthritis s/p multiple orthopedic surgeries.  Patient had a left TKA on 08/23/22 under spinal anesthesia. No complications noted.  Patient follows with Cardiology for surveillance of his TAA and mild AI/AS. He also follows with CT surgery for this. Last CTA in March 2024 showed TAA measured 4.8cm. Echo obtained 02/07/23 showed normal LV function with mild AI/AS. Last seen on 03/30/22 and was doing well. He was cleared for his L TKA on 08/05/22. He denies any symptoms at most recent PAT visit and has good functional status.   Seen by cardiothoracic surgeon 04/27/2022. Per OV note, "He has a stable 4.8 cm fusiform ascending aortic aneurysm which is unchanged dating back to 2011.  His echocardiogram in December 2022 showed mild to moderate aortic insufficiency.  There is mild aortic stenosis with a mean gradient of 12 mmHg and a peak gradient of 21 mmHg.  Left ventricular ejection fraction was 60 to 65%.  A follow-up echo in January 2024 showed mild aortic insufficiency.  The mean gradient across the aortic valve is unchanged at 12 mmHg.  Left ventricular ejection fraction was unchanged.  His aortic aneurysm is still below the surgical threshold of 5.5 cm.  I would expect him to have a larger aorta given his height of 6 foot 4 and 310 pounds.  His descending aorta at the same level measures about 3 cm."  1 year follow up with repeat CTA.   VS: BP (!) 161/82   Pulse 62   Temp 37 C (Oral)   Resp 16   Ht 6\' 4"  (1.93 m)   Wt (!) 144.7 kg   SpO2 96%   BMI 38.83 kg/m   PROVIDERS: Lonie Peak, PA-C Cardiology: Nanetta Batty, MD CT surgery: Rexanne Mano, MD  LABS: Labs reviewed: Acceptable for surgery. (all labs ordered are listed, but only abnormal results are displayed)  Labs Reviewed  BASIC METABOLIC PANEL - Abnormal; Notable for the following components:      Result Value   Glucose, Bld 114 (*)    All other components within normal limits  SURGICAL PCR SCREEN  CBC     IMAGES:  CTA chest 03/11/22:  IMPRESSION: 1. Stable aneurysmal disease of the ascending thoracic aorta with maximum diameter of approximately 4.8 cm. Ascending thoracic aortic aneurysm. Recommend semi-annual imaging followup by CTA or MRA and referral to cardiothoracic surgery if not already obtained. This recommendation follows 2010 ACCF/AHA/AATS/ACR/ASA/SCA/SCAI/SIR/STS/SVM Guidelines for the Diagnosis and Management of Patients With Thoracic Aortic Disease. Circulation. 2010; 121: O536-U440. Aortic aneurysm NOS (ICD10-I71.9) 2. Stable thickening and calcification of the aortic valve. 3. Coronary atherosclerosis. 4. Hepatic steatosis.   Aortic aneurysm NOS (ICD10-I71.9).  EKG:   CV: Echo 02/07/23:  IMPRESSIONS    1. Left ventricular ejection fraction, by estimation, is 60 to 65%. The left ventricle has normal function. The left ventricle has no regional wall motion abnormalities. There is mild left ventricular hypertrophy. Left ventricular diastolic parameters are indeterminate.  2. Right ventricular systolic function is normal. The right ventricular size is normal.  3. Left atrial  size was moderately dilated.  4. Right atrial size was mildly dilated.  5. The mitral valve is grossly normal. Trivial mitral valve regurgitation. No evidence of mitral stenosis.  6. The aortic valve was not well visualized. There is moderate calcification of the aortic valve. There is mild thickening of the aortic valve. Aortic valve regurgitation is mild. Mild aortic valve stenosis. Aortic  valve area, by VTI measures 1.29 cm. Aortic valve mean gradient measures 16.0 mmHg. Aortic valve Vmax measures 2.75 m/s.  7. Aortic dilatation noted. There is moderate dilatation of the ascending aorta, measuring 48 mm.  8. The inferior vena cava is normal in size with greater than 50% respiratory variability, suggesting right atrial pressure of 3 mmHg.  Past Medical History:  Diagnosis Date   Acute meniscal tear of knee left   Anemia    Aneurysm, ascending aorta (HCC) 4.8cm per ct 2011/ CARDIOLOGSIT--  DR [name]  LOV  10-07-2011  NOTE W/ CHART AND REQUEST LEXISCAN MYOVIEW AND ECHO TO BE FAXED.   ASYMPTOMATIC   Heart murmur MILD-- ASYMPTOMATIC   Hyperlipidemia    Hypertension    Lymph edema    bilateral legs- stockings on both legs   Neuromuscular disorder (HCC)    ltmphadema neuropathy feet and toes   OA (osteoarthritis) of knee left   OSA (obstructive sleep apnea) NON-TOLERANT CPAP   no cpap PT REPORTS NEVER FINISHED TEST    Pneumonia    Pre-diabetes    Sleep apnea    Tinnitus of both ears     Past Surgical History:  Procedure Laterality Date   BILATERAL IINGUINAL HERNIA REPAIR  1963   DOPPLER ECHOCARDIOGRAPHY  10/18/2011   EF >55 %   ENDOVENOUS ABLATION SAPHENOUS VEIN W/ LASER Right 12/30/2020   endovenous laser ablation right greater saphenous vein by Cari Caraway MD   EXCISIONAL TOTAL KNEE ARTHROPLASTY WITH ANTIBIOTIC SPACERS Right 07/22/2021   Procedure: EXCISIONAL TOTAL KNEE ARTHROPLASTY WITH ANTIBIOTIC SPACERS;  Surgeon: Durene Romans, MD;  [facility];  Service: Orthopedics;  Laterality: Right;   INCISION AND DRAINAGE OF WOUND Right 04/27/2021   Procedure: IRRIGATION AND DEBRIDEMENT OF HEMATOMA AND ABCESS ,ASPIRATE RIGHT KNEE;  Surgeon: Eugenia Mcalpine, MD;  [facility];  Service: Orthopedics;  Laterality: Right;  REQUEST 3:30PM START TIME   LUMBAR LAMINECTOMY/DECOMPRESSION MICRODISCECTOMY N/A 02/24/2016   Procedure: Bilateral Decompression L4-5,  Discectomy L4-5 ;  Surgeon: Jene Every, MD;  [facility];  Service: Orthopedics;  Laterality: N/A;   NM MYOCAR PERF WALL MOTION  10/13/2011   EF 49 %,low risk study   REIMPLANTATION OF TOTAL KNEE Right 09/30/2021   Procedure: REIMPLANTATION / REVISON OF TOTAL KNEE;  Surgeon: Durene Romans, MD;  [facility];  Service: Orthopedics;  Laterality: Right;  150   right elbow surgery     ulnar nerve transposiiton   SHOULDER ARTHROSCOPY Left 2022   TONSILLECTOMY  1965   TORN MENISCUS X 3 IN KNEES SURGERY      LEFT    TOTAL HIP ARTHROPLASTY Left 06/18/2009   OA LEFT HIP   TOTAL HIP ARTHROPLASTY Right 03/11/2014   Procedure: RIGHT TOTAL HIP ARTHROPLASTY ANTERIOR APPROACH;  Surgeon: Shelda Pal, MD;  [facility];  Service: Orthopedics;  Laterality: Right;   TOTAL KNEE ARTHROPLASTY Right 02/26/2020   Procedure: TOTAL KNEE ARTHROPLASTY;  Surgeon: Eugenia Mcalpine, MD;  [facility];  Service: Orthopedics;  Laterality: Right;  adductor canal   TOTAL KNEE ARTHROPLASTY Left 08/23/2022   Procedure: TOTAL KNEE  ARTHROPLASTY;  Surgeon: Durene Romans, MD;  [facility];  Service: Orthopedics;  Laterality: Left;   TRANSTHORACIC ECHOCARDIOGRAM  09/11/2012   EF 55 to 60 %,mild aortic valve regurg,mild mitral valve regurg,lf and rt atriums mildly dilated   VARICOSE VEIN SURGERY Left 1980   and 1994    MEDICATIONS:  acetaminophen (TYLENOL) 500 MG tablet   Ascorbic Acid (VITAMIN C) 1000 MG tablet   atorvastatin (LIPITOR) 40 MG tablet   cyanocobalamin (VITAMIN B12) 1000 MCG tablet   diclofenac (VOLTAREN) 75 MG EC tablet   furosemide (LASIX) 20 MG tablet   meclizine (ANTIVERT) 25 MG tablet   methocarbamol (ROBAXIN) 500 MG tablet   Polyethyl Glycol-Propyl Glycol (LUBRICATING EYE DROPS OP)   polyethylene glycol (MIRALAX / GLYCOLAX) 17 g packet   pregabalin (LYRICA) 75 MG capsule   tiZANidine (ZANAFLEX) 2 MG tablet   traMADol (ULTRAM) 50 MG tablet   triamcinolone (KENALOG) 0.1 %    valsartan (DIOVAN) 320 MG tablet   No current facility-administered medications for this encounter.   Marcille Blanco [facility] Short Stay/Anesthesiology [facility] Phone [phone] 02/08/2023 8:40 AM

## 2023-02-09 ENCOUNTER — Telehealth: Payer: Self-pay | Admitting: Cardiovascular Disease

## 2023-02-09 MED ORDER — ALPRAZOLAM 0.25 MG PO TABS: 0.2500 mg | ORAL_TABLET | Freq: Once | ORAL | 0 refills | Status: AC

## 2023-02-09 NOTE — Telephone Encounter (Signed)
Calling to see if Dr. Allyson Sabal can prescribe the patient some anxiety medication prior to him doing the ct. Please advise

## 2023-02-09 NOTE — Telephone Encounter (Signed)
Patient identification verified by 2 forms. Marilynn Rail, RN     Called and spoke to patient  Informed patient:   -Provider okay'd Rx for Xanax 0.25mg    -take Rx 72minutes-1 hour before CT   -Plan to have someone drive him to/from CT  Patient request RX sent to [facility] on [address]  Patient verbalized understanding, no questions at this time

## 2023-02-09 NOTE — Telephone Encounter (Signed)
Runell Gess, MD     He can have Xanax 0.25 mg   RN called in prescription to [facility] on [address]

## 2023-02-16 ENCOUNTER — Ambulatory Visit (HOSPITAL_BASED_OUTPATIENT_CLINIC_OR_DEPARTMENT_OTHER): Payer: Medicare Other | Admitting: Anesthesiology

## 2023-02-16 ENCOUNTER — Encounter (HOSPITAL_COMMUNITY): Payer: Self-pay | Admitting: Orthopedic Surgery

## 2023-02-16 ENCOUNTER — Ambulatory Visit (HOSPITAL_COMMUNITY)
Admission: RE | Admit: 2023-02-16 | Discharge: 2023-02-16 | Disposition: A | Discharge status: Home / Self Care | Payer: Medicare Other | Source: Ambulatory Visit | Attending: Orthopedic Surgery | Admitting: Orthopedic Surgery

## 2023-02-16 ENCOUNTER — Other Ambulatory Visit: Payer: Self-pay

## 2023-02-16 ENCOUNTER — Other Ambulatory Visit (HOSPITAL_COMMUNITY): Payer: Self-pay

## 2023-02-16 ENCOUNTER — Ambulatory Visit (HOSPITAL_COMMUNITY): Payer: Medicare Other | Admitting: Medical

## 2023-02-16 ENCOUNTER — Encounter (HOSPITAL_COMMUNITY)
Admission: RE | Disposition: A | Discharge status: Home / Self Care | Payer: Self-pay | Source: Ambulatory Visit | Attending: Orthopedic Surgery

## 2023-02-16 DIAGNOSIS — G4733 Obstructive sleep apnea (adult) (pediatric): Secondary | ICD-10-CM | POA: Insufficient documentation

## 2023-02-16 DIAGNOSIS — E785 Hyperlipidemia, unspecified: Secondary | ICD-10-CM | POA: Diagnosis not present

## 2023-02-16 DIAGNOSIS — I10 Essential (primary) hypertension: Secondary | ICD-10-CM | POA: Diagnosis not present

## 2023-02-16 DIAGNOSIS — M1711 Unilateral primary osteoarthritis, right knee: Secondary | ICD-10-CM | POA: Diagnosis not present

## 2023-02-16 DIAGNOSIS — M75101 Unspecified rotator cuff tear or rupture of right shoulder, not specified as traumatic: Secondary | ICD-10-CM | POA: Insufficient documentation

## 2023-02-16 DIAGNOSIS — M129 Arthropathy, unspecified: Secondary | ICD-10-CM | POA: Diagnosis not present

## 2023-02-16 DIAGNOSIS — X58XXXA Exposure to other specified factors, initial encounter: Secondary | ICD-10-CM | POA: Insufficient documentation

## 2023-02-16 DIAGNOSIS — M12811 Other specific arthropathies, not elsewhere classified, right shoulder: Secondary | ICD-10-CM | POA: Diagnosis not present

## 2023-02-16 DIAGNOSIS — Z79899 Other long term (current) drug therapy: Secondary | ICD-10-CM | POA: Insufficient documentation

## 2023-02-16 DIAGNOSIS — Z01818 Encounter for other preprocedural examination: Secondary | ICD-10-CM

## 2023-02-16 HISTORY — PX: REVERSE SHOULDER ARTHROPLASTY: SHX5054

## 2023-02-16 LAB — GLUCOSE, CAPILLARY: Glucose-Capillary: 113 mg/dL — ABNORMAL HIGH (ref 70–99)

## 2023-02-16 SURGERY — ARTHROPLASTY, SHOULDER, TOTAL, REVERSE
Anesthesia: Regional | Site: Shoulder | Laterality: Right

## 2023-02-16 MED ORDER — CHLORHEXIDINE GLUCONATE 0.12 % MT SOLN
15.0000 mL | Freq: Once | OROMUCOSAL | Status: AC
  Administered 2023-02-16: 15 mL via OROMUCOSAL

## 2023-02-16 MED ORDER — OXYCODONE HCL 5 MG PO TABS: 5.0000 mg | ORAL_TABLET | Freq: Once | ORAL | Status: DC | PRN

## 2023-02-16 MED ORDER — FENTANYL CITRATE (PF) 100 MCG/2ML IJ SOLN
INTRAMUSCULAR | Status: DC | PRN
  Administered 2023-02-16 (×4): 25 ug via INTRAVENOUS

## 2023-02-16 MED ORDER — OXYCODONE HCL 5 MG/5ML PO SOLN: 5.0000 mg | Freq: Once | ORAL | Status: DC | PRN

## 2023-02-16 MED ORDER — HYDROMORPHONE HCL 2 MG/ML IJ SOLN
INTRAMUSCULAR | Status: AC
  Filled 2023-02-16: qty 1

## 2023-02-16 MED ORDER — ONDANSETRON HCL 4 MG/2ML IJ SOLN
INTRAMUSCULAR | Status: AC
  Filled 2023-02-16: qty 2

## 2023-02-16 MED ORDER — MIDAZOLAM HCL 2 MG/2ML IJ SOLN
INTRAMUSCULAR | Status: AC
  Filled 2023-02-16: qty 2

## 2023-02-16 MED ORDER — PROPOFOL 10 MG/ML IV BOLUS
INTRAVENOUS | Status: AC
  Filled 2023-02-16: qty 20

## 2023-02-16 MED ORDER — BUPIVACAINE HCL (PF) 0.5 % IJ SOLN
INTRAMUSCULAR | Status: DC | PRN
  Administered 2023-02-16: 10 mL via PERINEURAL

## 2023-02-16 MED ORDER — PROPOFOL 10 MG/ML IV BOLUS
INTRAVENOUS | Status: DC | PRN
  Administered 2023-02-16 (×2): 30 mg via INTRAVENOUS
  Administered 2023-02-16: 200 mg via INTRAVENOUS

## 2023-02-16 MED ORDER — METOCLOPRAMIDE HCL 5 MG PO TABS: 5.0000 mg | ORAL_TABLET | Freq: Three times a day (TID) | ORAL | Status: DC | PRN

## 2023-02-16 MED ORDER — SUCCINYLCHOLINE CHLORIDE 200 MG/10ML IV SOSY
PREFILLED_SYRINGE | INTRAVENOUS | Status: DC | PRN
  Administered 2023-02-16: 190 mg via INTRAVENOUS

## 2023-02-16 MED ORDER — FENTANYL CITRATE PF 50 MCG/ML IJ SOSY: 25.0000 ug | PREFILLED_SYRINGE | INTRAMUSCULAR | Status: DC | PRN

## 2023-02-16 MED ORDER — VANCOMYCIN HCL 1000 MG IV SOLR
INTRAVENOUS | Status: AC
  Filled 2023-02-16: qty 20

## 2023-02-16 MED ORDER — PHENYLEPHRINE 80 MCG/ML (10ML) SYRINGE FOR IV PUSH (FOR BLOOD PRESSURE SUPPORT)
PREFILLED_SYRINGE | INTRAVENOUS | Status: AC
  Filled 2023-02-16: qty 10

## 2023-02-16 MED ORDER — DEXAMETHASONE SODIUM PHOSPHATE 10 MG/ML IJ SOLN
INTRAMUSCULAR | Status: AC
  Filled 2023-02-16: qty 1

## 2023-02-16 MED ORDER — ACETAMINOPHEN 325 MG PO TABS: 325.0000 mg | ORAL_TABLET | ORAL | Status: DC | PRN

## 2023-02-16 MED ORDER — VANCOMYCIN HCL 1000 MG IV SOLR
INTRAVENOUS | Status: DC | PRN
  Administered 2023-02-16: 1000 mg via TOPICAL

## 2023-02-16 MED ORDER — LIDOCAINE HCL (CARDIAC) PF 100 MG/5ML IV SOSY
PREFILLED_SYRINGE | INTRAVENOUS | Status: DC | PRN
  Administered 2023-02-16: 100 mg via INTRAVENOUS

## 2023-02-16 MED ORDER — 0.9 % SODIUM CHLORIDE (POUR BTL) OPTIME
TOPICAL | Status: DC | PRN
  Administered 2023-02-16: 1000 mL

## 2023-02-16 MED ORDER — METHOCARBAMOL 500 MG PO TABS
500.0000 mg | ORAL_TABLET | Freq: Three times a day (TID) | ORAL | 1 refills | Status: AC | PRN
  Filled 2023-02-16: qty 30, 10d supply, fill #0

## 2023-02-16 MED ORDER — ONDANSETRON HCL 4 MG/2ML IJ SOLN: 4.0000 mg | Freq: Once | INTRAMUSCULAR | Status: DC | PRN

## 2023-02-16 MED ORDER — PHENYLEPHRINE HCL-NACL 20-0.9 MG/250ML-% IV SOLN
INTRAVENOUS | Status: DC | PRN
  Administered 2023-02-16: 15 ug/min via INTRAVENOUS

## 2023-02-16 MED ORDER — TRANEXAMIC ACID 1000 MG/10ML IV SOLN: 1000.0000 mg | INTRAVENOUS | Status: DC

## 2023-02-16 MED ORDER — DEXAMETHASONE SODIUM PHOSPHATE 4 MG/ML IJ SOLN
INTRAMUSCULAR | Status: DC | PRN
  Administered 2023-02-16: 8 mg via INTRAVENOUS

## 2023-02-16 MED ORDER — CEFAZOLIN SODIUM-DEXTROSE 2-4 GM/100ML-% IV SOLN
2.0000 g | INTRAVENOUS | Status: DC
  Filled 2023-02-16: qty 100

## 2023-02-16 MED ORDER — BUPIVACAINE LIPOSOME 1.3 % IJ SUSP
INTRAMUSCULAR | Status: DC | PRN
  Administered 2023-02-16: 10 mL via PERINEURAL

## 2023-02-16 MED ORDER — OXYCODONE-ACETAMINOPHEN 5-325 MG PO TABS
1.0000 | ORAL_TABLET | ORAL | 0 refills | Status: DC | PRN
  Filled 2023-02-16: qty 20, 4d supply, fill #0

## 2023-02-16 MED ORDER — ROCURONIUM BROMIDE 100 MG/10ML IV SOLN
INTRAVENOUS | Status: DC | PRN
  Administered 2023-02-16: 60 mg via INTRAVENOUS

## 2023-02-16 MED ORDER — ONDANSETRON HCL 4 MG/2ML IJ SOLN
INTRAMUSCULAR | Status: DC | PRN
  Administered 2023-02-16: 4 mg via INTRAVENOUS

## 2023-02-16 MED ORDER — STERILE WATER FOR IRRIGATION IR SOLN
Status: DC | PRN
  Administered 2023-02-16: 1000 mL

## 2023-02-16 MED ORDER — ACETAMINOPHEN 160 MG/5ML PO SOLN: 325.0000 mg | ORAL | Status: DC | PRN

## 2023-02-16 MED ORDER — PHENYLEPHRINE HCL-NACL 20-0.9 MG/250ML-% IV SOLN
INTRAVENOUS | Status: AC
  Filled 2023-02-16: qty 500

## 2023-02-16 MED ORDER — LACTATED RINGERS IV SOLN: INTRAVENOUS | Status: DC | PRN

## 2023-02-16 MED ORDER — METOCLOPRAMIDE HCL 5 MG/ML IJ SOLN: 5.0000 mg | Freq: Three times a day (TID) | INTRAMUSCULAR | Status: DC | PRN

## 2023-02-16 MED ORDER — SUGAMMADEX SODIUM 200 MG/2ML IV SOLN
INTRAVENOUS | Status: DC | PRN
  Administered 2023-02-16: 150 mg via INTRAVENOUS
  Administered 2023-02-16: 50 mg via INTRAVENOUS

## 2023-02-16 MED ORDER — PROPOFOL 500 MG/50ML IV EMUL
INTRAVENOUS | Status: AC
  Filled 2023-02-16: qty 100

## 2023-02-16 MED ORDER — ORAL CARE MOUTH RINSE: 15.0000 mL | Freq: Once | OROMUCOSAL | Status: AC

## 2023-02-16 MED ORDER — ROCURONIUM BROMIDE 10 MG/ML (PF) SYRINGE
PREFILLED_SYRINGE | INTRAVENOUS | Status: AC
  Filled 2023-02-16: qty 10

## 2023-02-16 MED ORDER — MEPERIDINE HCL 50 MG/ML IJ SOLN: 6.2500 mg | INTRAMUSCULAR | Status: DC | PRN

## 2023-02-16 MED ORDER — FENTANYL CITRATE (PF) 100 MCG/2ML IJ SOLN
INTRAMUSCULAR | Status: AC
  Filled 2023-02-16: qty 2

## 2023-02-16 MED ORDER — ACETAMINOPHEN 500 MG PO TABS
1000.0000 mg | ORAL_TABLET | Freq: Once | ORAL | Status: AC
  Administered 2023-02-16: 500 mg via ORAL
  Filled 2023-02-16: qty 2

## 2023-02-16 MED ORDER — MIDAZOLAM HCL 2 MG/2ML IJ SOLN
INTRAMUSCULAR | Status: DC | PRN
  Administered 2023-02-16 (×2): 1 mg via INTRAVENOUS

## 2023-02-16 MED ORDER — SODIUM CHLORIDE 0.9 % IV SOLN
3.0000 g | Freq: Once | INTRAVENOUS | Status: AC
  Administered 2023-02-16: 3 g via INTRAVENOUS
  Filled 2023-02-16: qty 3

## 2023-02-16 MED ORDER — LIDOCAINE HCL (PF) 2 % IJ SOLN
INTRAMUSCULAR | Status: AC
  Filled 2023-02-16: qty 5

## 2023-02-16 MED ORDER — ONDANSETRON HCL 4 MG PO TABS
4.0000 mg | ORAL_TABLET | Freq: Three times a day (TID) | ORAL | 0 refills | Status: DC | PRN
  Filled 2023-02-16: qty 10, 4d supply, fill #0

## 2023-02-16 MED ORDER — TRANEXAMIC ACID-NACL 1000-0.7 MG/100ML-% IV SOLN
1000.0000 mg | INTRAVENOUS | Status: AC
  Administered 2023-02-16: 1000 mg via INTRAVENOUS
  Filled 2023-02-16: qty 100

## 2023-02-16 SURGICAL SUPPLY — 65 items
BAG COUNTER SPONGE SURGICOUNT (BAG) IMPLANT
BAG ZIPLOCK 12X15 (MISCELLANEOUS) ×1 IMPLANT
BIT DRILL AR 3 NS (BIT) IMPLANT
BLADE SAW SGTL 83.5X18.5 (BLADE) ×1 IMPLANT
BNDG COHESIVE 4X5 TAN STRL LF (GAUZE/BANDAGES/DRESSINGS) ×1 IMPLANT
COOLER ICEMAN CLASSIC (MISCELLANEOUS) ×1 IMPLANT
COVER BACK TABLE 60X90IN (DRAPES) ×1 IMPLANT
COVER SURGICAL LIGHT HANDLE (MISCELLANEOUS) ×1 IMPLANT
CUP SUT UNIV REVERS 42 NEUT (Shoulder) IMPLANT
DRAPE SHEET LG 3/4 BI-LAMINATE (DRAPES) ×1 IMPLANT
DRAPE SURG 17X11 SM STRL (DRAPES) ×1 IMPLANT
DRAPE SURG ORHT 6 SPLT 77X108 (DRAPES) ×2 IMPLANT
DRAPE TOP 10253 STERILE (DRAPES) ×1 IMPLANT
DRAPE U-SHAPE 47X51 STRL (DRAPES) ×1 IMPLANT
DRESSING AQUACEL AG SP 3.5X6 (GAUZE/BANDAGES/DRESSINGS) ×1 IMPLANT
DRSG AQUACEL AG ADV 3.5X10 (GAUZE/BANDAGES/DRESSINGS) IMPLANT
DRSG AQUACEL AG SP 3.5X6 (GAUZE/BANDAGES/DRESSINGS) ×1
DURAPREP 26ML APPLICATOR (WOUND CARE) ×1 IMPLANT
ELECT BLADE TIP CTD 4 INCH (ELECTRODE) ×1 IMPLANT
ELECT PENCIL ROCKER SW 15FT (MISCELLANEOUS) ×1 IMPLANT
ELECT REM PT RETURN 15FT ADLT (MISCELLANEOUS) ×1 IMPLANT
FACESHIELD WRAPAROUND (MASK) ×5
FACESHIELD WRAPAROUND OR TEAM (MASK) ×5 IMPLANT
GLENOID SYS 42 +4 LAT/24 SHLDR (Miscellaneous) IMPLANT
GLENOID UNI REV MOD 24 +2 LAT (Joint) IMPLANT
GLOVE BIO SURGEON STRL SZ7.5 (GLOVE) ×1 IMPLANT
GLOVE BIO SURGEON STRL SZ8 (GLOVE) ×1 IMPLANT
GLOVE SS BIOGEL STRL SZ 7 (GLOVE) ×1 IMPLANT
GLOVE SS BIOGEL STRL SZ 7.5 (GLOVE) ×1 IMPLANT
GOWN STRL SURGICAL XL XLNG (GOWN DISPOSABLE) ×2 IMPLANT
INSERT HUM CONST L/42 +3 (Insert) IMPLANT
KIT BASIN OR (CUSTOM PROCEDURE TRAY) ×1 IMPLANT
KIT TURNOVER KIT A (KITS) IMPLANT
MANIFOLD NEPTUNE II (INSTRUMENTS) ×1 IMPLANT
MAT PREVALON FULL STRYKER (MISCELLANEOUS) IMPLANT
NDL TAPERED W/ NITINOL LOOP (MISCELLANEOUS) ×1 IMPLANT
NEEDLE TAPERED W/ NITINOL LOOP (MISCELLANEOUS) ×1
NS IRRIG 1000ML POUR BTL (IV SOLUTION) ×1 IMPLANT
PACK SHOULDER (CUSTOM PROCEDURE TRAY) ×1 IMPLANT
PAD ARMBOARD 7.5X6 YLW CONV (MISCELLANEOUS) ×1 IMPLANT
PAD COLD SHLDR WRAP-ON (PAD) ×1 IMPLANT
PIN NITINOL TARGETER 2.8 (PIN) IMPLANT
PIN SET MODULAR GLENOID SYSTEM (PIN) IMPLANT
RESTRAINT HEAD UNIVERSAL NS (MISCELLANEOUS) ×1 IMPLANT
SCREW CENTRAL MOD 30MM (Screw) IMPLANT
SCREW PERI LOCK 5.5X36 (Screw) IMPLANT
SCREW PERIPHERAL 5.5X20 LOCK (Screw) IMPLANT
SCREW PERIPHERAL 5.5X28 LOCK (Screw) IMPLANT
SLING ARM FOAM STRAP LRG (SOFTGOODS) IMPLANT
SLING ARM FOAM STRAP MED (SOFTGOODS) IMPLANT
SLING ARM FOAM STRAP XLG (SOFTGOODS) IMPLANT
SLING ARM IMMOBILIZER XL (CAST SUPPLIES) IMPLANT
SPACER SHLD UNI REV 42 +6 (Shoulder) IMPLANT
STEM HUM UNIV REV SZ13 (Stem) IMPLANT
STRIP CLOSURE SKIN 1/2X4 (GAUZE/BANDAGES/DRESSINGS) ×1 IMPLANT
SUT MNCRL AB 3-0 PS2 18 (SUTURE) ×1 IMPLANT
SUT MON AB 2-0 CT1 36 (SUTURE) ×1 IMPLANT
SUT VIC AB 1 CT1 36 (SUTURE) ×1 IMPLANT
SUTURE TAPE 1.3 40 TPR END (SUTURE) ×2 IMPLANT
SUTURETAPE 1.3 40 TPR END (SUTURE) ×2
TOWEL GREEN STERILE FF (TOWEL DISPOSABLE) ×1 IMPLANT
TOWEL OR 17X26 10 PK STRL BLUE (TOWEL DISPOSABLE) ×1 IMPLANT
TUBE SUCTION HIGH CAP CLEAR NV (SUCTIONS) ×1 IMPLANT
TUBING CONNECTING 10 (TUBING) ×1 IMPLANT
WATER STERILE IRR 1000ML POUR (IV SOLUTION) ×2 IMPLANT

## 2023-02-16 NOTE — Transfer of Care (Signed)
 Immediate Anesthesia Transfer of Care Note  Patient: Travis Myers  Procedure(s) Performed: REVERSE SHOULDER ARTHROPLASTY (Right: Shoulder)  Patient Location: PACU  Anesthesia Type:General and Regional  Level of Consciousness: awake and drowsy  Airway & Oxygen Therapy: Patient Spontanous Breathing and Patient connected to face mask oxygen  Post-op Assessment: Report given to RN and Post -op Vital signs reviewed and stable  Post vital signs: Reviewed and stable  Last Vitals:  Vitals Value Taken Time  BP 127/71 02/16/23 0946  Temp    Pulse 56 02/16/23 0948  Resp 21 02/16/23 0948  SpO2 100 % 02/16/23 0948  Vitals shown include unfiled device data.  Last Pain:  Vitals:   02/16/23 0541  TempSrc: Oral  PainSc: 0-No pain         Complications: No notable events documented.

## 2023-02-16 NOTE — Discharge Instructions (Addendum)

## 2023-02-16 NOTE — H&P (Signed)
 Travis Myers    Chief Complaint: Right Shoulder Rotator Cuff Tear Arthropathy HPI: The patient is a 68 y.o. male with chronic and progressively increasing right shoulder pain related to severe rotator cuff tear arthropathy.  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   ASYMPTOMATIC   Heart murmur MILD-- ASYMPTOMATIC   Hyperlipidemia    Hypertension    Lymph edema    bilateral legs- stockings on both legs   Neuromuscular disorder (HCC)    ltmphadema neuropathy feet and toes   OA (osteoarthritis) of knee left   OSA (obstructive sleep apnea) NON-TOLERANT CPAP   no cpap PT REPORTS NEVER FINISHED TEST    Pneumonia    Pre-diabetes    Sleep apnea    Tinnitus of both ears       Past Surgical History:  Procedure Laterality Date   BILATERAL IINGUINAL HERNIA REPAIR  1963   DOPPLER ECHOCARDIOGRAPHY  10/18/2011   EF >55 %   ENDOVENOUS ABLATION SAPHENOUS VEIN W/ LASER Right 12/30/2020   endovenous laser ablation right greater saphenous vein by Medford Blade MD   EXCISIONAL TOTAL KNEE ARTHROPLASTY WITH ANTIBIOTIC SPACERS Right 07/22/2021   Procedure: EXCISIONAL TOTAL KNEE ARTHROPLASTY WITH ANTIBIOTIC SPACERS;  Surgeon: Ernie Cough, MD;  Location: [facility];  Service: Orthopedics;  Laterality: Right;   INCISION AND DRAINAGE OF WOUND Right 04/27/2021   Procedure: IRRIGATION AND DEBRIDEMENT OF HEMATOMA AND ABCESS ,ASPIRATE RIGHT KNEE;  Surgeon: Gerome Charleston, MD;  Location: [facility];  Service: Orthopedics;  Laterality: Right;  REQUEST 3:30PM START TIME   LUMBAR  LAMINECTOMY/DECOMPRESSION MICRODISCECTOMY N/A 02/24/2016   Procedure: Bilateral Decompression L4-5, Discectomy L4-5 ;  Surgeon: Reyes Billing, MD;  Location: [facility];  Service: Orthopedics;  Laterality: N/A;   NM MYOCAR PERF WALL MOTION  10/13/2011   EF 49 %,low risk study   REIMPLANTATION OF TOTAL KNEE Right 09/30/2021   Procedure: REIMPLANTATION / REVISON OF TOTAL KNEE;  Surgeon: Ernie Cough, MD;  Location: [facility];  Service: Orthopedics;  Laterality: Right;  150   right elbow surgery     ulnar nerve transposiiton   SHOULDER ARTHROSCOPY Left 2022   TONSILLECTOMY  1965   TORN MENISCUS X 3 IN KNEES SURGERY      LEFT    TOTAL HIP ARTHROPLASTY Left 06/18/2009   OA LEFT HIP   TOTAL HIP ARTHROPLASTY Right 03/11/2014   Procedure: RIGHT TOTAL HIP ARTHROPLASTY ANTERIOR APPROACH;  Surgeon: Cough JONETTA Ernie, MD;  Location: [facility];  Service: Orthopedics;  Laterality: Right;   TOTAL KNEE ARTHROPLASTY Right 02/26/2020   Procedure: TOTAL KNEE ARTHROPLASTY;  Surgeon: Gerome Charleston, MD;  Location: [facility];  Service: Orthopedics;  Laterality: Right;  adductor canal   TOTAL KNEE ARTHROPLASTY Left 08/23/2022   Procedure: TOTAL KNEE ARTHROPLASTY;  Surgeon: Ernie Cough, MD;  Location: [facility];  Service: Orthopedics;  Laterality: Left;   TRANSTHORACIC ECHOCARDIOGRAM  09/11/2012   EF 55 to 60 %,mild aortic valve regurg,mild mitral valve regurg,lf and rt atriums mildly dilated   VARICOSE VEIN SURGERY Left 1980   and 1994  Family History  Problem Relation Age of Onset   Diabetes Mother    Cancer Mother    Heart failure Other    Diabetes Other    Cancer Other    COPD Other    Colon cancer Neg Hx    Stomach cancer Neg Hx     Social History:  reports that he has never smoked. He quit smokeless tobacco use about 34 years ago. He reports that he does not currently use alcohol after a past usage of about 1.0 standard drink of alcohol per week. He reports that he does not use drugs.  BMI: Estimated body  mass index is 38.83 kg/m as calculated from the following:   Height as of this encounter: 6' 4 (1.93 m).   Weight as of this encounter: 144.7 kg.  Lab Results  Component Value Date   ALBUMIN 3.5 04/26/2021   Diabetes: Patient does not have a diagnosis of diabetes.     Smoking Status:   reports that he has never smoked. He quit smokeless tobacco use about 34 years ago.     Medications Prior to Admission  Medication Sig Dispense Refill   acetaminophen  (TYLENOL ) 500 MG tablet Take 1,000 mg by mouth 3 (three) times daily. Takes with tramadol      Ascorbic Acid (VITAMIN C ) 1000 MG tablet Take 1,000 mg by mouth daily.     atorvastatin  (LIPITOR) 40 MG tablet TAKE 1 TABLET BY MOUTH EVERY DAY 90 tablet 3   cyanocobalamin (VITAMIN B12) 1000 MCG tablet Take 1,000 mcg by mouth daily.     diclofenac (VOLTAREN) 75 MG EC tablet Take 75 mg by mouth 2 (two) times daily.     furosemide  (LASIX ) 20 MG tablet Take 40 mg by mouth in the morning. Additional 20 mg if needed for fluid  5   meclizine  (ANTIVERT ) 25 MG tablet Take 25 mg by mouth 3 (three) times daily as needed for dizziness.     methocarbamol  (ROBAXIN ) 500 MG tablet Take 500 mg by mouth 3 (three) times daily as needed for muscle spasms.     Polyethyl Glycol-Propyl Glycol (LUBRICATING EYE DROPS OP) Place 1 drop into both eyes daily as needed (dry eyes).     pregabalin  (LYRICA ) 75 MG capsule Take 75 mg by mouth 2 (two) times daily.     traMADol  (ULTRAM ) 50 MG tablet Take 100 mg by mouth 3 (three) times daily.     triamcinolone  (KENALOG ) 0.1 % Apply 1 application  topically once a week.     valsartan (DIOVAN) 320 MG tablet Take 320 mg by mouth daily.     polyethylene glycol (MIRALAX  / GLYCOLAX ) 17 g packet Take 17 g by mouth 2 (two) times daily. (Patient not taking: Reported on 01/31/2023) 14 each 0   tiZANidine  (ZANAFLEX ) 2 MG tablet Take 1-2 tablets (2-4 mg total) by mouth every 8 (eight) hours as needed for muscle spasms. (Patient not taking:  Reported on 01/31/2023) 30 tablet 2     Physical Exam: Right shoulder demonstrates painful and guarded motion is noted at recent office visits.  He has globally decreased strength to manual muscle testing.  Examination otherwise as noted at recent office visits.  Imaging studies confirm changes consistent with a chronic right shoulder rotator cuff tear arthropathy.  Vitals  Temp:  [98.6 F (37 C)] 98.6 F (37 C) (02/06 0541) Pulse Rate:  [67] 67 (02/06 0541) Resp:  [16] 16 (02/06 0541) BP: (165)/(84) 165/84 (02/06 0541) SpO2:  [96 %] 96 % (  02/06 0541) Weight:  [144.7 kg] 144.7 kg (02/06 0536)  Assessment/Plan  Impression: Right Shoulder Rotator Cuff Tear Arthropathy  Plan of Action: Procedure(s): REVERSE SHOULDER ARTHROPLASTY  Krystena Reitter M Jannet Calip 02/16/2023, 6:40 AM Contact # [phone]

## 2023-02-16 NOTE — Anesthesia Procedure Notes (Signed)
 Anesthesia Regional Block: Interscalene brachial plexus block   Pre-Anesthetic Checklist: , timeout performed,  Correct Patient, Correct Site, Correct Laterality,  Correct Procedure, Correct Position, site marked,  Risks and benefits discussed,  Surgical consent,  Pre-op evaluation,  At surgeon's request and post-op pain management  Laterality: Right  Prep: chloraprep       Needles:  Injection technique: Single-shot  Needle Type: Echogenic Stimulator Needle     Needle Length: 5cm  Needle Gauge: 22     Additional Needles:   Procedures:, nerve stimulator,,, ultrasound used (permanent image in chart),,     Nerve Stimulator or Paresthesia:  Response: hand, 0.45 mA  Additional Responses:   Narrative:  Start time: 02/16/2023 7:00 AM End time: 02/16/2023 7:16 AM Injection made incrementally with aspirations every 5 mL.  Performed by: Personally  Anesthesiologist: Mallory Manus, MD  Additional Notes: Functioning IV was confirmed and monitors were applied.  A 50mm 22ga Arrow echogenic stimulator needle was used. Sterile prep and drape,hand hygiene and sterile gloves were used. Ultrasound guidance: relevant anatomy identified, needle position confirmed, local anesthetic spread visualized around nerve(s)., vascular puncture avoided.  Image printed for medical record. Negative aspiration and negative test dose prior to incremental administration of local anesthetic. The patient tolerated the procedure well.

## 2023-02-16 NOTE — Evaluation (Signed)
 Occupational Therapy Evaluation Patient Details Name: Travis Myers MRN: [id number] DOB: 05/05/1955 Today's Date: 02/16/2023   History of Present Illness Travis Myers is a 68 y/o male seen 02/16/23 for planned R shoulder reverse arthroplasty. PMH includes but is not limited to: HTN, HLD, pre-diabetes, sleep apnea, AAA, meniscal tear Lt knee, R THA (2016), L THA (2011), lumbar laminectomy/decompression (2018), R TKA (2022) and R TKA revision (02/2022)., L TKA (08/2022)   Clinical Impression   PTA pt lives with wife and is typically independent in ADL and transfers.  Education completed regarding compensatory strategies for ADL tasks and functional mobility, management of sling, ROM per specified parameters in the order set as indicated below, positioning of operative arm in sitting and supine and edema control, including use of Iceman Cold Therapy machine. Caregiver present for education, written handouts provided and reviewed using Teach Back and pt/caregiver verbalized/demonstrated understanding. Due to the below listed deficits, pt requires set up to max assistance with ADL tasks and no assist with functional mobility. Caregiver will be able to provide necessary level of assistance at discharge. Pt to follow up with MD to progress rehab of the operative shoulder. No questions or concerns at the end of session, OT to sign off acutely.         If plan is discharge home, recommend the following: A lot of help with bathing/dressing/bathroom;Assistance with cooking/housework;Assist for transportation    Functional Status Assessment  Patient has had a recent decline in their functional status and demonstrates the ability to make significant improvements in function in a reasonable and predictable amount of time.  Equipment Recommendations  None recommended by OT    Recommendations for Other Services       Precautions / Restrictions Precautions Precautions: Shoulder Shoulder  Interventions: Shoulder sling/immobilizer;Off for dressing/bathing/exercises;For comfort Precaution Booklet Issued: Yes (comment) Precaution Comments: OK for pendulums and lap slides, OK for functional ADL within ER 20; ABD 45, FE 60 Required Braces or Orthoses: Sling Restrictions Weight Bearing Restrictions Per Provider Order: Yes RUE Weight Bearing Per Provider Order: Non weight bearing      Mobility Bed Mobility                    Transfers Overall transfer level: Modified independent                 General transfer comment: supervision due to setting      Balance Overall balance assessment: No apparent balance deficits (not formally assessed)                                         ADL either performed or assessed with clinical judgement   ADL                                         General ADL Comments: see shoulder section below for more relevant information     Vision         Perception         Praxis         Pertinent Vitals/Pain Pain Assessment Pain Assessment: No/denies pain (block still in place for RUE)     Extremity/Trunk Assessment Upper Extremity Assessment Upper Extremity Assessment: RUE deficits/detail RUE Deficits / Details: post-op Reverse TSA RUE Sensation:  decreased light touch (block still in place) RUE Coordination: decreased fine motor;decreased gross motor (due to block)   Lower Extremity Assessment Lower Extremity Assessment: Overall WFL for tasks assessed       Communication Communication Communication: No apparent difficulties   Cognition Arousal: Alert Behavior During Therapy: WFL for tasks assessed/performed Overall Cognitive Status: Within Functional Limits for tasks assessed                                 General Comments: Wife also present throughout session and recieved all education     General Comments  Wife present throughout session and recieved  all education    Exercises Exercises: Shoulder Shoulder Exercises Pendulum Exercise: Right, 10 reps, Standing (educated and demonstrated, not performed due to block) Shoulder Flexion: Right (educated to 60 for functional ADL) Shoulder ABduction: Right (educated to 45 for functional ADL) Shoulder External Rotation: Right (educated to 20 for functional ADL) Elbow Flexion: AROM, Right Elbow Extension: AROM, Right Wrist Flexion: AROM, Right Wrist Extension: AROM, Right Digit Composite Flexion: AROM, Right Composite Extension: AROM Neck Flexion: AROM Neck Extension: AROM Neck Lateral Flexion - Right: AROM Neck Lateral Flexion - Left: AROM   Shoulder Instructions Shoulder Instructions Donning/doffing shirt without moving shoulder: Maximal assistance;Caregiver independent with task;Patient able to independently direct caregiver Method for sponge bathing under operated UE: Caregiver independent with task Donning/doffing sling/immobilizer: Maximal assistance;Caregiver independent with task;Patient able to independently direct caregiver Correct positioning of sling/immobilizer: Moderate assistance;Caregiver independent with task Pendulum exercises (written home exercise program): Min-guard (educated and demonstrated) ROM for elbow, wrist and digits of operated UE: Modified independent (educated and demonstrated) Sling wearing schedule (on at all times/off for ADL's): Independent Proper positioning of operated UE when showering: Supervision/safety Positioning of UE while sleeping: Modified independent    Home Living Family/patient expects to be discharged to:: Private residence Living Arrangements: Spouse/significant other Available Help at Discharge: Family Type of Home: House Home Access: Stairs to enter Secretary/administrator of Steps: 3 Entrance Stairs-Rails: Right Home Layout: One level     Bathroom Shower/Tub: Chief Strategy Officer: Handicapped height Bathroom  Accessibility: Yes How Accessible: Accessible via walker Home Equipment: Agricultural Consultant (2 wheels);Cane - single point;BSC/3in1          Prior Functioning/Environment Prior Level of Function : Independent/Modified Independent                        OT Problem List: Decreased range of motion;Decreased safety awareness;Decreased knowledge of use of DME or AE;Impaired sensation;Impaired UE functional use;Pain;Increased edema      OT Treatment/Interventions:      OT Goals(Current goals can be found in the care plan section) Acute Rehab OT Goals Patient Stated Goal: rehab shoulder safely OT Goal Formulation: With patient/family Time For Goal Achievement: 03/02/23 Potential to Achieve Goals: Good  OT Frequency:      Co-evaluation              AM-PAC OT 6 Clicks Daily Activity     Outcome Measure Help from another person eating meals?: A Little Help from another person taking care of personal grooming?: A Little Help from another person toileting, which includes using toliet, bedpan, or urinal?: A Lot Help from another person bathing (including washing, rinsing, drying)?: A Lot Help from another person to put on and taking off regular upper body clothing?: A Lot Help from another person to  put on and taking off regular lower body clothing?: A Lot 6 Click Score: 14   End of Session Equipment Utilized During Treatment: Other (comment) (sling, ice machine) Nurse Communication: Mobility status;Precautions;Weight bearing status  Activity Tolerance: Patient tolerated treatment well Patient left: in chair;with family/visitor present  OT Visit Diagnosis: Pain Pain - Right/Left: Right Pain - part of body: Shoulder                Time: 1240-1315 OT Time Calculation (min): 35 min Charges:  OT General Charges $OT Visit: 1 Visit OT Evaluation $OT Eval Moderate Complexity: 1 Mod OT Treatments $Self Care/Home Management : 8-22 mins  Leita DEL OTR/L Acute Rehabilitation  Services Office: [phone] Leita PARAS [facility] 02/16/2023, 2:00 PM

## 2023-02-16 NOTE — Anesthesia Procedure Notes (Signed)
 Procedure Name: Intubation Date/Time: 02/16/2023 8:05 AM  Performed by: Harl Armida PARAS, CRNAPre-anesthesia Checklist: Patient identified, Emergency Drugs available, Suction available, Patient being monitored and Timeout performed Patient Re-evaluated:Patient Re-evaluated prior to induction Oxygen Delivery Method: Circle system utilized Preoxygenation: Pre-oxygenation with 100% oxygen Induction Type: IV induction Ventilation: Two handed mask ventilation required Laryngoscope Size: Mac, 4 and Glidescope Grade View: Grade I Tube type: Oral Tube size: 7.5 mm Number of attempts: 1 Airway Equipment and Method: Stylet and Video-laryngoscopy Placement Confirmation: ETT inserted through vocal cords under direct vision, positive ETCO2 and breath sounds checked- equal and bilateral Secured at: 25 cm Tube secured with: Tape Dental Injury: Teeth and Oropharynx as per pre-operative assessment

## 2023-02-16 NOTE — Anesthesia Postprocedure Evaluation (Signed)
 Anesthesia Post Note  Patient: Travis Myers  Procedure(s) Performed: REVERSE SHOULDER ARTHROPLASTY (Right: Shoulder)     Patient location during evaluation: PACU Anesthesia Type: Regional and General Level of consciousness: awake and alert Pain management: pain level controlled Vital Signs Assessment: post-procedure vital signs reviewed and stable Respiratory status: spontaneous breathing, nonlabored ventilation, respiratory function stable and patient connected to nasal cannula oxygen Cardiovascular status: blood pressure returned to baseline and stable Postop Assessment: no apparent nausea or vomiting Anesthetic complications: no   No notable events documented.  Last Vitals:  Vitals:   02/16/23 1115 02/16/23 1200  BP: 117/67 120/67  Pulse: (!) 58 (!) 57  Resp:    Temp: 36.5 C   SpO2: 93% 94%    Last Pain:  Vitals:   02/16/23 1200  TempSrc:   PainSc: 0-No pain                 Mohogany Toppins

## 2023-02-16 NOTE — Op Note (Signed)
 02/16/2023  9:25 AM  PATIENT:   Travis Myers  68 y.o. male  PRE-OPERATIVE DIAGNOSIS:  Right Shoulder Rotator Cuff Tear Arthropathy  POST-OPERATIVE DIAGNOSIS: Same  PROCEDURE: Right shoulder reverse arthroplasty lysing a press-fit size 13 Arthrex stem with a neutral metathesis, +6 spacer, +3 constrained polyethylene insert, 42/+4 glenosphere on a small/+2 baseplate  SURGEON:  Nilan Iddings, Franky BATTLE M.D.  ASSISTANTS: Randine Ricks, PA-C  Randine Ricks, PA-C was utilized as an geophysicist/field seismologist throughout this case, essential for help with positioning the patient, positioning extremity, tissue manipulation, implantation of the prosthesis, suture management, wound closure, and intraoperative decision-making.  ANESTHESIA:   General Endotracheal and interscalene block with Exparel  around  EBL: 150 cc  SPECIMEN: None  Drains: None   PATIENT DISPOSITION:  PACU - hemodynamically stable.    PLAN OF CARE: Discharge to home after PACU  Brief history:  Patient is a 68 year old gentleman with chronic and progressive increasing right shoulder pain related to severe rotator cuff tear arthropathy.  Due to his increasing functional limitations and failure to respond to prolonged attempts at conservative management, he is brought to the operating today for planned right shoulder reverse arthroplasty.  Preoperatively, I counseled the patient regarding treatment options and risks versus benefits thereof.  Possible surgical complications were all reviewed including potential for bleeding, infection, neurovascular injury, persistent pain, loss of motion, anesthetic complication, failure of the implant, and possible need for additional surgery. They understand and accept and agrees with our planned procedure.   Procedure detail:  After undergoing routine preop evaluation the patient received prophylactic antibiotics and interscalene block with Exparel  was established in the holding area by the anesthesia  department.  Subsequently placed spine on the operating table and underwent the smooth induction of a general endotracheal anesthesia.  Placed into the beachchair position and appropriately padded and protected.  The right shoulder girdle region was sterilely prepped and draped in standard fashion.  Timeout was called.  A deltopectoral approach to the right shoulder is made to an approximately 10 cm incision.  Skin flaps elevated dissection carried deeply the deltopectoral interval was then developed from proximal to distal with the vein taken laterally.  The conjoined tendon was mobilized and retracted medially and adhesions were divided beneath the deltoid.  The long head biceps tendon was then tenodesed at the upper border the pectoralis major tendon with the proximal segment unroofed and excised.  The superior rotator cuff was then split from the apex of the bicipital groove to the base of the coracoid and the subscap was separated from the lesser tuberosity using electrocautery and tagged with a pair of grasping suture tape sutures.  Capsular attachments were then divided from the anterior and inferior margins of the humeral neck allowing delivery of the humeral head through the wound.  An extra medullary guide was then used to outline the proposed humeral head resection which we performed with an oscillating saw at approximately 20 degrees of retroversion.  A rondure was then used to remove the marginal osteophytes and a metal cap was then placed over the cut proximal humeral surface.  The glenoid was then exposed and a circumferential labral resection completed.  A guidepin directed into the center of the glenoid and the glenoid was then reamed with the central followed by peripheral reamer to a stable subchondral bony bed in preparation completed with a drill and tapped for a 30 mm lag screw.  All soft tissue L[place]bree was then carefully evaluated.  Our baseplate was assembled  and inserted with vancomycin   powder applied to the threads of the lag screw and excellent fixation was achieved.  All of the peripheral locking screws were then placed using standard technique with excellent fixation.  A 42/+4 glenosphere was then impacted onto the baseplate and a central locking screw was placed.  Our attention was then returned back to the proximal humerus where the canal was opened and we broached up to a size 13 and approximately 20 degrees of retroversion.  A neutral metaphyseal reaming guide was then used to prepare the metaphysis.  A trial implant was placed and this showed good motion stability and soft tissue balance.  The trial was then removed.  Our final implant was assembled.  The canal was irrigated cleaned and dried with vancomycin  powder applied into the canal and the final implant was then seated with excellent fixation.  A series of trial reductions was then performed and ultimately felt that a +6 spacer with +3 poly gives the best motion stability and soft tissue balance.  Trial was then removed and the plastic spacer was placed onto the implant and the +3 constrained poly again inserted and our final reduction showed excellent motion ability and soft tissue balance all much to our satisfaction.  The wound was then copious irrigated.  Final hemostasis was obtained.  Balance of the vancomycin  powder was sprayed liberally throughout the deep soft tissue planes.  The deltopectoral interval was reapproximated with a series of figure-of-eight number Vicryl sutures for after the subscapularis was confirmed to have good elasticity and was repaired back to the eyelets on the collar of the implant using the previously placed suture tape sutures.  The subcu layer was closed with 2-0 Monocryl and intracuticular 3-0 Monocryl used to close the skin followed by Steri-Strips and an Aquacel dressing.  Right arm placed into a sling.  The patient was awakened, extubated, and taken to the recovery room in stable  condition.  Franky CHRISTELLA Pointer MD   Contact # [phone]

## 2023-02-17 ENCOUNTER — Other Ambulatory Visit (HOSPITAL_COMMUNITY): Payer: Self-pay

## 2023-02-17 ENCOUNTER — Encounter (HOSPITAL_COMMUNITY): Payer: Self-pay | Admitting: Orthopedic Surgery

## 2023-02-17 DIAGNOSIS — I351 Nonrheumatic aortic (valve) insufficiency: Secondary | ICD-10-CM

## 2023-02-17 DIAGNOSIS — I10 Essential (primary) hypertension: Secondary | ICD-10-CM

## 2023-02-17 DIAGNOSIS — E782 Mixed hyperlipidemia: Secondary | ICD-10-CM

## 2023-02-27 ENCOUNTER — Other Ambulatory Visit (HOSPITAL_COMMUNITY): Payer: Self-pay

## 2023-03-23 ENCOUNTER — Telehealth: Payer: Self-pay | Admitting: Cardiovascular Disease

## 2023-03-23 DIAGNOSIS — I351 Nonrheumatic aortic (valve) insufficiency: Secondary | ICD-10-CM

## 2023-03-23 DIAGNOSIS — E782 Mixed hyperlipidemia: Secondary | ICD-10-CM

## 2023-03-23 DIAGNOSIS — I10 Essential (primary) hypertension: Secondary | ICD-10-CM

## 2023-03-23 DIAGNOSIS — I7121 Aneurysm of the ascending aorta, without rupture: Secondary | ICD-10-CM

## 2023-03-23 NOTE — Telephone Encounter (Signed)
 Patient says he's having a shoulder replacement soon. He wants to move CT out a few months because he doesn't think he'll be comfortable getting in tube after surgery and wants additional recovery time. Current order expires 3/20. Patient would like to have new order placed.

## 2023-03-23 NOTE — Telephone Encounter (Signed)
 Called patient and left a message to call back.   Josie LPN

## 2023-03-24 ENCOUNTER — Other Ambulatory Visit: Payer: Self-pay

## 2023-03-24 DIAGNOSIS — I7121 Aneurysm of the ascending aorta, without rupture: Secondary | ICD-10-CM

## 2023-03-24 NOTE — Telephone Encounter (Signed)
 Spoke to patient chest cta scheduled at [facility] 5/27 arrive at 11:30 am.

## 2023-03-24 NOTE — Telephone Encounter (Signed)
 Pt is returning nurse call and is requesting a callback. Please advise

## 2023-03-30 ENCOUNTER — Ambulatory Visit (HOSPITAL_COMMUNITY): Payer: Medicare Other

## 2023-03-30 ENCOUNTER — Other Ambulatory Visit (HOSPITAL_BASED_OUTPATIENT_CLINIC_OR_DEPARTMENT_OTHER): Payer: Medicare Other

## 2023-04-04 ENCOUNTER — Ambulatory Visit: Payer: Medicare Other | Admitting: Podiatry

## 2023-04-11 ENCOUNTER — Ambulatory Visit

## 2023-04-17 ENCOUNTER — Ambulatory Visit (INDEPENDENT_AMBULATORY_CARE_PROVIDER_SITE_OTHER): Admitting: Podiatry

## 2023-04-17 DIAGNOSIS — I739 Peripheral vascular disease, unspecified: Secondary | ICD-10-CM

## 2023-04-17 DIAGNOSIS — B351 Tinea unguium: Secondary | ICD-10-CM | POA: Diagnosis not present

## 2023-04-17 DIAGNOSIS — M79674 Pain in right toe(s): Secondary | ICD-10-CM

## 2023-04-17 DIAGNOSIS — M79675 Pain in left toe(s): Secondary | ICD-10-CM | POA: Diagnosis not present

## 2023-04-17 DIAGNOSIS — L84 Corns and callosities: Secondary | ICD-10-CM | POA: Diagnosis not present

## 2023-04-17 NOTE — Progress Notes (Unsigned)
 Subjective:  Patient ID: Travis Myers, male    DOB: [birth year],  MRN: [id number]   Gevena Barre Myers presents to clinic today for: No chief complaint on file.  Patient notes nails are thick, discolored, elongated and painful in shoegear when trying to ambulate.  Also has painful calluses bilateral submet 5 and lateral aspect right fifth met base.  PCP is Lonie Peak, PA-C.  Past Medical History:  Diagnosis Date   Acute meniscal tear of knee left   Anemia    Aneurysm, ascending aorta (HCC) 4.8cm per ct 2011/ CARDIOLOGSIT--  DR [name]  LOV  10-07-2011  NOTE W/ CHART AND REQUEST LEXISCAN MYOVIEW AND ECHO TO BE FAXED.   ASYMPTOMATIC   Heart murmur MILD-- ASYMPTOMATIC   Hyperlipidemia    Hypertension    Lymph edema    bilateral legs- stockings on both legs   Neuromuscular disorder (HCC)    ltmphadema neuropathy feet and toes   OA (osteoarthritis) of knee left   OSA (obstructive sleep apnea) NON-TOLERANT CPAP   no cpap PT REPORTS NEVER FINISHED TEST    Pneumonia    Pre-diabetes    Sleep apnea    Tinnitus of both ears     Past Surgical History:  Procedure Laterality Date   BILATERAL IINGUINAL HERNIA REPAIR  1963   DOPPLER ECHOCARDIOGRAPHY  10/18/2011   EF >55 %   ENDOVENOUS ABLATION SAPHENOUS VEIN W/ LASER Right 12/30/2020   endovenous laser ablation right greater saphenous vein by Cari Caraway MD   EXCISIONAL TOTAL KNEE ARTHROPLASTY WITH ANTIBIOTIC SPACERS Right 07/22/2021   Procedure: EXCISIONAL TOTAL KNEE ARTHROPLASTY WITH ANTIBIOTIC SPACERS;  Surgeon: Durene Romans, MD;  Location: [facility];  Service: Orthopedics;  Laterality: Right;   INCISION AND DRAINAGE OF WOUND Right 04/27/2021   Procedure: IRRIGATION AND DEBRIDEMENT OF HEMATOMA AND ABCESS ,ASPIRATE RIGHT KNEE;  Surgeon: Eugenia Mcalpine, MD;  Location: [facility];  Service: Orthopedics;  Laterality: Right;  REQUEST 3:30PM START TIME   LUMBAR LAMINECTOMY/DECOMPRESSION MICRODISCECTOMY N/A 02/24/2016    Procedure: Bilateral Decompression L4-5, Discectomy L4-5 ;  Surgeon: Jene Every, MD;  Location: [facility];  Service: Orthopedics;  Laterality: N/A;   NM MYOCAR PERF WALL MOTION  10/13/2011   EF 49 %,low risk study   REIMPLANTATION OF TOTAL KNEE Right 09/30/2021   Procedure: REIMPLANTATION / REVISON OF TOTAL KNEE;  Surgeon: Durene Romans, MD;  [facility]: [facility];  Service: Orthopedics;  Laterality: Right;  150   REVERSE SHOULDER ARTHROPLASTY Right 02/16/2023   Procedure: REVERSE SHOULDER ARTHROPLASTY;  Surgeon: Francena Hanly, MD;  Location: [facility];  Service: Orthopedics;  Laterality: Right;  120 min   right elbow surgery     ulnar nerve transposiiton   SHOULDER ARTHROSCOPY Left 2022   TONSILLECTOMY  1965   TORN MENISCUS X 3 IN KNEES SURGERY      LEFT    TOTAL HIP ARTHROPLASTY Left 06/18/2009   OA LEFT HIP   TOTAL HIP ARTHROPLASTY Right 03/11/2014   Procedure: RIGHT TOTAL HIP ARTHROPLASTY ANTERIOR APPROACH;  Surgeon: Shelda Pal, MD;  Location: [facility];  Service: Orthopedics;  Laterality: Right;   TOTAL KNEE ARTHROPLASTY Right 02/26/2020   Procedure: TOTAL KNEE ARTHROPLASTY;  Surgeon: Eugenia Mcalpine, MD;  [facility]: [facility];  Service: Orthopedics;  Laterality: Right;  adductor canal   TOTAL KNEE ARTHROPLASTY Left 08/23/2022   Procedure: TOTAL KNEE ARTHROPLASTY;  Surgeon: Durene Romans, MD;  [facility];  Service: Orthopedics;  Laterality: Left;  TRANSTHORACIC ECHOCARDIOGRAM  09/11/2012   EF 55 to 60 %,mild aortic valve regurg,mild mitral valve regurg,lf and rt atriums mildly dilated   VARICOSE VEIN SURGERY Left 1980   and 1994    Allergies  Allergen Reactions   Ciprofloxacin Other (See Comments)    Unknown   Penicillins Other (See Comments)    Childhood reaction- not recalled Tolerated Cephalosporin Date: 08/24/22.     Quinolones Other (See Comments)    Patient was warned about not using Cipro and similar antibiotics. Recent studies have raised concern that fluoroquinolone  antibiotics could be associated with an increased risk of aortic aneurysm Fluoroquinolones have non-antimicrobial properties that might jeopardise the integrity of the extracellular matrix of the vascular wall In a  propensity score matched cohort study in [place], there was a 66% increased rate of aortic aneurysm or dissection associated with oral fluoroquinolone use, compared wit    Review of Systems: Negative except as noted in the HPI.  Objective:  There were no vitals filed for this visit.  Travis Myers is a pleasant 68 y.o. male in NAD. AAO x 3.  Vascular Examination: Capillary refill time is 3-5 seconds to toes bilateral. Palpable pedal pulses b/l LE. Digital hair present b/l.  Skin temperature gradient WNL b/l. No varicosities b/l. No cyanosis noted b/l.   Dermatological Examination: Pedal skin with normal turgor, texture and tone b/l. No open wounds. No interdigital macerations b/l. Toenails x10 are 3mm thick, discolored, dystrophic with subungual debris. There is pain with compression of the nail plates.  They are elongated x10  Neurological Examination: Protective sensation intact bilateral LE. Vibratory sensation intact bilateral LE.  Musculoskeletal Examination: Muscle strength 5/5 to all LE muscle groups b/l.       No data to display           Assessment/Plan: 1. Pain due to onychomycosis of toenails of both feet     No orders of the defined types were placed in this encounter.  None  The mycotic toenails were sharply debrided x10 with sterile nail nippers and a power debriding burr to decrease bulk/thickness and length.    No follow-ups on file.   Clerance Lav, DPM, FACFAS [facility]     [address] [place], [place] [address]                Office [phone]  Fax [phone]

## 2023-05-17 ENCOUNTER — Encounter: Payer: Self-pay | Admitting: Gastroenterology

## 2023-06-06 ENCOUNTER — Other Ambulatory Visit: Payer: Self-pay

## 2023-06-06 ENCOUNTER — Ambulatory Visit (HOSPITAL_COMMUNITY)

## 2023-06-06 MED ORDER — ATORVASTATIN CALCIUM 40 MG PO TABS: 40.0000 mg | ORAL_TABLET | Freq: Every day | ORAL | 0 refills | Status: DC

## 2023-06-13 ENCOUNTER — Ambulatory Visit

## 2023-06-16 ENCOUNTER — Ambulatory Visit

## 2023-06-16 VITALS — Ht 76.0 in | Wt 318.0 lb

## 2023-06-16 DIAGNOSIS — Z860100 Personal history of colon polyps, unspecified: Secondary | ICD-10-CM

## 2023-06-16 MED ORDER — NA SULFATE-K SULFATE-MG SULF 17.5-3.13-1.6 GM/177ML PO SOLN: 1.0000 | Freq: Once | ORAL | 0 refills | Status: AC

## 2023-06-16 NOTE — Progress Notes (Signed)

## 2023-07-02 ENCOUNTER — Other Ambulatory Visit: Payer: Self-pay | Admitting: Cardiovascular Disease

## 2023-07-04 ENCOUNTER — Encounter: Payer: Self-pay | Admitting: Gastroenterology

## 2023-07-04 ENCOUNTER — Ambulatory Visit: Admitting: Gastroenterology

## 2023-07-04 VITALS — BP 138/84 | HR 56 | Temp 98.0°F | Resp 17 | Ht 76.0 in | Wt 318.0 lb

## 2023-07-04 DIAGNOSIS — D128 Benign neoplasm of rectum: Secondary | ICD-10-CM

## 2023-07-04 DIAGNOSIS — D122 Benign neoplasm of ascending colon: Secondary | ICD-10-CM | POA: Diagnosis not present

## 2023-07-04 DIAGNOSIS — Z1211 Encounter for screening for malignant neoplasm of colon: Secondary | ICD-10-CM

## 2023-07-04 DIAGNOSIS — Z860100 Personal history of colon polyps, unspecified: Secondary | ICD-10-CM

## 2023-07-04 DIAGNOSIS — Z860101 Personal history of adenomatous and serrated colon polyps: Secondary | ICD-10-CM

## 2023-07-04 DIAGNOSIS — D120 Benign neoplasm of cecum: Secondary | ICD-10-CM

## 2023-07-04 DIAGNOSIS — K640 First degree hemorrhoids: Secondary | ICD-10-CM

## 2023-07-04 MED ORDER — SODIUM CHLORIDE 0.9 % IV SOLN: 500.0000 mL | Freq: Once | INTRAVENOUS | Status: DC

## 2023-07-04 NOTE — Patient Instructions (Addendum)
 - High fiber diet.                           - Use FiberCon 1-2 tablets PO daily.                           - Continue present medications.                           - Await pathology results.                           - Repeat colonoscopy in 3/5/7 years for                            surveillance based on pathology results and history                            of previous adenomatous colon polyps.                           - The findings and recommendations were discussed                            with the patient.                         YOU HAD AN ENDOSCOPIC PROCEDURE TODAY AT THE [facility]:   Refer to the procedure report that was given to you for any specific questions about what was found during the examination.  If the procedure report does not answer your questions, please call your gastroenterologist to clarify.  If you requested that your care partner not be given the details of your procedure findings, then the procedure report has been included in a sealed envelope for you to review at your convenience later.  YOU SHOULD EXPECT: Some feelings of bloating in the abdomen. Passage of more gas than usual.  Walking can help get rid of the air that was put into your GI tract during the procedure and reduce the bloating. If you had a lower endoscopy (such as a colonoscopy or flexible sigmoidoscopy) you may notice spotting of blood in your stool or on the toilet paper. If you underwent a bowel prep for your procedure, you may not have a normal bowel movement for a few days.  Please Note:  You might notice some irritation and congestion in your nose or some drainage.  This is from the oxygen used during your procedure.  There is no need for concern and it should clear up in a day or so.  SYMPTOMS TO REPORT IMMEDIATELY:  Following lower endoscopy (colonoscopy or flexible sigmoidoscopy):  Excessive amounts of blood in the stool  Significant tenderness  or worsening of abdominal pains  Swelling of the abdomen that is new, acute  Fever of 100F or higher   For urgent or emergent issues, a gastroenterologist can be reached at any hour by calling [phone]. Do not use MyChart messaging for urgent concerns.    DIET:  We do recommend a small meal at first, but then you may proceed to your regular diet.  Drink plenty of fluids  but you should avoid alcoholic beverages for 24 hours.  ACTIVITY:  You should plan to take it easy for the rest of today and you should NOT DRIVE or use heavy machinery until tomorrow (because of the sedation medicines used during the test).    FOLLOW UP: Our staff will call the number listed on your records the next business day following your procedure.  We will call around 7:15- 8:00 am to check on you and address any questions or concerns that you may have regarding the information given to you following your procedure. If we do not reach you, we will leave a message.     If any biopsies were taken you will be contacted by phone or by letter within the next 1-3 weeks.  Please call us  at [phone] if you have not heard about the biopsies in 3 weeks.    SIGNATURES/CONFIDENTIALITY: You and/or your care partner have signed paperwork which will be entered into your electronic medical record.  These signatures attest to the fact that that the information above on your After Visit Summary has been reviewed and is understood.  Full responsibility of the confidentiality of this discharge information lies with you and/or your care-partner.

## 2023-07-04 NOTE — Progress Notes (Signed)
 Pt's states no medical or surgical changes since previsit or office visit.

## 2023-07-04 NOTE — Progress Notes (Signed)
 Report given to PACU, vss

## 2023-07-04 NOTE — Progress Notes (Signed)
 GASTROENTEROLOGY PROCEDURE H&P NOTE   Primary Care Physician: Montey Lot, PA-C  HPI: Travis Myers is a 68 y.o. male who presents for Colonoscopy for surveillance of previous adenomas.  Past Medical History:  Diagnosis Date   Acute meniscal tear of knee left   Anemia    Aneurysm, ascending aorta (HCC) 4.8cm per ct 2011/ CARDIOLOGSIT--  DR MAYE  LOV  10-07-2011  NOTE W/ CHART AND REQUEST LEXISCAN  MYOVIEW AND ECHO TO BE FAXED.   ASYMPTOMATIC   Heart murmur MILD-- ASYMPTOMATIC   Hyperlipidemia    Hypertension    Lymph edema    bilateral legs- stockings on both legs   Neuromuscular disorder (HCC)    ltmphadema neuropathy feet and toes   OA (osteoarthritis) of knee left   OSA (obstructive sleep apnea) NON-TOLERANT CPAP   no cpap PT REPORTS NEVER FINISHED TEST    Pneumonia    Pre-diabetes    Sleep apnea    Tinnitus of both ears    Past Surgical History:  Procedure Laterality Date   BILATERAL IINGUINAL HERNIA REPAIR  1963   DOPPLER ECHOCARDIOGRAPHY  10/18/2011   EF >55 %   ENDOVENOUS ABLATION SAPHENOUS VEIN W/ LASER Right 12/30/2020   endovenous laser ablation right greater saphenous vein by Medford Blade MD   EXCISIONAL TOTAL KNEE ARTHROPLASTY WITH ANTIBIOTIC SPACERS Right 07/22/2021   Procedure: EXCISIONAL TOTAL KNEE ARTHROPLASTY WITH ANTIBIOTIC SPACERS;  Surgeon: Ernie Cough, MD;  [facility]: [facility];  Service: Orthopedics;  Laterality: Right;   INCISION AND DRAINAGE OF WOUND Right 04/27/2021   Procedure: IRRIGATION AND DEBRIDEMENT OF HEMATOMA AND ABCESS ,ASPIRATE RIGHT KNEE;  Surgeon: Gerome Charleston, MD;  Location: [facility];  Service: Orthopedics;  Laterality: Right;  REQUEST 3:30PM START TIME   LUMBAR LAMINECTOMY/DECOMPRESSION MICRODISCECTOMY N/A 02/24/2016   Procedure: Bilateral Decompression L4-5, Discectomy L4-5 ;  Surgeon: Reyes Billing, MD;  Location: [facility];  Service: Orthopedics;  Laterality: N/A;   NM MYOCAR PERF WALL MOTION  10/13/2011   EF 49  %,low risk study   REIMPLANTATION OF TOTAL KNEE Right 09/30/2021   Procedure: REIMPLANTATION / REVISON OF TOTAL KNEE;  Surgeon: Ernie Cough, MD;  Location: [facility];  Service: Orthopedics;  Laterality: Right;  150   REVERSE SHOULDER ARTHROPLASTY Right 02/16/2023   Procedure: REVERSE SHOULDER ARTHROPLASTY;  Surgeon: Melita Drivers, MD;  Location: [facility];  Service: Orthopedics;  Laterality: Right;  120 min   right elbow surgery     ulnar nerve transposiiton   SHOULDER ARTHROSCOPY Left 2022   TONSILLECTOMY  1965   TORN MENISCUS X 3 IN KNEES SURGERY      LEFT    TOTAL HIP ARTHROPLASTY Left 06/18/2009   OA LEFT HIP   TOTAL HIP ARTHROPLASTY Right 03/11/2014   Procedure: RIGHT TOTAL HIP ARTHROPLASTY ANTERIOR APPROACH;  Surgeon: Cough JONETTA Ernie, MD;  [facility];  Service: Orthopedics;  Laterality: Right;   TOTAL KNEE ARTHROPLASTY Right 02/26/2020   Procedure: TOTAL KNEE ARTHROPLASTY;  Surgeon: Gerome Charleston, MD;  [facility]: [facility];  Service: Orthopedics;  Laterality: Right;  adductor canal   TOTAL KNEE ARTHROPLASTY Left 08/23/2022   Procedure: TOTAL KNEE ARTHROPLASTY;  Surgeon: Ernie Cough, MD;  [facility]: [facility];  Service: Orthopedics;  Laterality: Left;   TRANSTHORACIC ECHOCARDIOGRAM  09/11/2012   EF 55 to 60 %,mild aortic valve regurg,mild mitral valve regurg,lf and rt atriums mildly dilated   VARICOSE VEIN SURGERY Left 1980   and 1994   Current Outpatient Medications  Medication Sig Dispense Refill  acetaminophen  (TYLENOL ) 500 MG tablet Take 1,000 mg by mouth 3 (three) times daily. Takes with tramadol      Ascorbic Acid (VITAMIN C ) 1000 MG tablet Take 1,000 mg by mouth daily.     atorvastatin  (LIPITOR) 40 MG tablet Take 1 tablet (40 mg total) by mouth daily. 30 tablet 0   cyanocobalamin (VITAMIN B12) 1000 MCG tablet Take 1,000 mcg by mouth daily.     diclofenac (VOLTAREN) 75 MG EC tablet Take 75 mg by mouth 2 (two) times daily.     furosemide  (LASIX ) 20 MG tablet Take 40 mg by mouth in  the morning. Additional 20 mg if needed for fluid  5   meclizine  (ANTIVERT ) 25 MG tablet Take 25 mg by mouth 3 (three) times daily as needed for dizziness.     methocarbamol  (ROBAXIN ) 500 MG tablet Take 1 tablet (500 mg total) by mouth 3 (three) times daily as needed for muscle spasms. 30 tablet 1   Polyethyl Glycol-Propyl Glycol (LUBRICATING EYE DROPS OP) Place 1 drop into both eyes daily as needed (dry eyes).     pregabalin  (LYRICA ) 75 MG capsule Take 75 mg by mouth 2 (two) times daily.     traMADol  (ULTRAM ) 50 MG tablet Take 100 mg by mouth 3 (three) times daily.     triamcinolone  (KENALOG ) 0.1 % Apply 1 application  topically once a week.     valsartan (DIOVAN) 320 MG tablet Take 320 mg by mouth daily.     ALPRAZolam  (XANAX ) 0.25 MG tablet SMARTSIG:1 Tablet(s) By Mouth (Patient not taking: Reported on 07/04/2023)     cyclobenzaprine (FLEXERIL) 10 MG tablet Take 10 mg by mouth every 8 (eight) hours as needed. (Patient not taking: Reported on 07/04/2023)     Oxycodone  HCl 10 MG TABS Take 1 tablet 4 times a day by oral route as needed for 5 days. (Patient not taking: Reported on 07/04/2023)     oxyCODONE -acetaminophen  (PERCOCET) 5-325 MG tablet Take 1 tablet by mouth every 4 (four) hours as needed (max 6 q). (Patient not taking: Reported on 07/04/2023) 20 tablet 0   traZODone  (DESYREL ) 50 MG tablet Take 50 mg by mouth at bedtime.     Current Facility-Administered Medications  Medication Dose Route Frequency Provider Last Rate Last Admin   0.9 %  sodium chloride  infusion  500 mL Intravenous Once Mansouraty, Taevyn Hausen Jr., MD        Current Outpatient Medications:    acetaminophen  (TYLENOL ) 500 MG tablet, Take 1,000 mg by mouth 3 (three) times daily. Takes with tramadol , Disp: , Rfl:    Ascorbic Acid (VITAMIN C ) 1000 MG tablet, Take 1,000 mg by mouth daily., Disp: , Rfl:    atorvastatin  (LIPITOR) 40 MG tablet, Take 1 tablet (40 mg total) by mouth daily., Disp: 30 tablet, Rfl: 0   cyanocobalamin  (VITAMIN B12) 1000 MCG tablet, Take 1,000 mcg by mouth daily., Disp: , Rfl:    diclofenac (VOLTAREN) 75 MG EC tablet, Take 75 mg by mouth 2 (two) times daily., Disp: , Rfl:    furosemide  (LASIX ) 20 MG tablet, Take 40 mg by mouth in the morning. Additional 20 mg if needed for fluid, Disp: , Rfl: 5   meclizine  (ANTIVERT ) 25 MG tablet, Take 25 mg by mouth 3 (three) times daily as needed for dizziness., Disp: , Rfl:    methocarbamol  (ROBAXIN ) 500 MG tablet, Take 1 tablet (500 mg total) by mouth 3 (three) times daily as needed for muscle spasms., Disp: 30 tablet, Rfl: 1  Polyethyl Glycol-Propyl Glycol (LUBRICATING EYE DROPS OP), Place 1 drop into both eyes daily as needed (dry eyes)., Disp: , Rfl:    pregabalin  (LYRICA ) 75 MG capsule, Take 75 mg by mouth 2 (two) times daily., Disp: , Rfl:    traMADol  (ULTRAM ) 50 MG tablet, Take 100 mg by mouth 3 (three) times daily., Disp: , Rfl:    triamcinolone  (KENALOG ) 0.1 %, Apply 1 application  topically once a week., Disp: , Rfl:    valsartan (DIOVAN) 320 MG tablet, Take 320 mg by mouth daily., Disp: , Rfl:    ALPRAZolam  (XANAX ) 0.25 MG tablet, SMARTSIG:1 Tablet(s) By Mouth (Patient not taking: Reported on 07/04/2023), Disp: , Rfl:    cyclobenzaprine (FLEXERIL) 10 MG tablet, Take 10 mg by mouth every 8 (eight) hours as needed. (Patient not taking: Reported on 07/04/2023), Disp: , Rfl:    Oxycodone  HCl 10 MG TABS, Take 1 tablet 4 times a day by oral route as needed for 5 days. (Patient not taking: Reported on 07/04/2023), Disp: , Rfl:    oxyCODONE -acetaminophen  (PERCOCET) 5-325 MG tablet, Take 1 tablet by mouth every 4 (four) hours as needed (max 6 q). (Patient not taking: Reported on 07/04/2023), Disp: 20 tablet, Rfl: 0   traZODone  (DESYREL ) 50 MG tablet, Take 50 mg by mouth at bedtime., Disp: , Rfl:   Current Facility-Administered Medications:    0.9 %  sodium chloride  infusion, 500 mL, Intravenous, Once, Mansouraty, Aloha Raddle., MD Allergies  Allergen Reactions    Ciprofloxacin Other (See Comments)    Unknown   Penicillins Other (See Comments)    Childhood reaction- not recalled  Tolerated Cephalosporin Date: 08/24/22.   Quinolones Other (See Comments)    Patient was warned about not using Cipro and similar antibiotics. Recent studies have raised concern that fluoroquinolone antibiotics could be associated with an increased risk of aortic aneurysm Fluoroquinolones have non-antimicrobial properties that might jeopardise the integrity of the extracellular matrix of the vascular wall In a  propensity score matched cohort study in [place], there was a 66% increased rate of aortic aneurysm or dissection associated with oral fluoroquinolone use, compared wit   Family History  Problem Relation Age of Onset   Diabetes Mother    Cancer Mother    Heart failure Other    Diabetes Other    Cancer Other    COPD Other    Colon cancer Neg Hx    Stomach cancer Neg Hx    Rectal cancer Neg Hx    Esophageal cancer Neg Hx    Social History   Socioeconomic History   Marital status: Married    Spouse name: Not on file   Number of children: Not on file   Years of education: Not on file   Highest education level: Not on file  Occupational History   Not on file  Tobacco Use   Smoking status: Never   Smokeless tobacco: Former    Quit date: 09/11/1988  Vaping Use   Vaping status: Never Used  Substance and Sexual Activity   Alcohol use: Not Currently    Alcohol/week: 1.0 standard drink of alcohol    Types: 1 Standard drinks or equivalent per week    Comment: RARE   Drug use: No   Sexual activity: Yes  Other Topics Concern   Not on file  Social History Narrative   Not on file   Social Drivers of Health   Financial Resource Strain: Not on file  Food Insecurity: No Food Insecurity (08/23/2022)  Hunger Vital Sign    Worried About Running Out of Food in the Last Year: Never true    Ran Out of Food in the Last Year: Never true  Transportation Needs: No  Transportation Needs (08/23/2022)   PRAPARE - Administrator, Civil Service (Medical): No    Lack of Transportation (Non-Medical): No  Physical Activity: Not on file  Stress: Not on file  Social Connections: Unknown (05/23/2021)   Received from [facility]   Social Network    Social Network: Not on file  Intimate Partner Violence: Not At Risk (08/23/2022)   Humiliation, Afraid, Rape, and Kick questionnaire    Fear of Current or Ex-Partner: No    Emotionally Abused: No    Physically Abused: No    Sexually Abused: No    Physical Exam: Today's Vitals   07/04/23 0724 07/04/23 0725  BP: (!) 153/79   Pulse: (!) 59   Temp: 98 F (36.7 C) 98 F (36.7 C)  TempSrc: Temporal   SpO2: 96%   Weight: (!) 318 lb (144.2 kg)   Height: 6' 4 (1.93 m)    Body mass index is 38.71 kg/m. GEN: NAD EYE: Sclerae anicteric ENT: MMM CV: Non-tachycardic GI: Soft, NT/ND NEURO:  Alert & Oriented x 3  Lab Results: No results for input(s): WBC, HGB, HCT, PLT in the last 72 hours. BMET No results for input(s): NA, K, CL, CO2, GLUCOSE, BUN, CREATININE, CALCIUM  in the last 72 hours. LFT No results for input(s): PROT, ALBUMIN, AST, ALT, ALKPHOS, BILITOT, BILIDIR, IBILI in the last 72 hours. PT/INR No results for input(s): LABPROT, INR in the last 72 hours.   Impression / Plan: This is a 68 y.o.male who presents for Colonoscopy for surveillance of previous adenomas.  The risks and benefits of endoscopic evaluation/treatment were discussed with the patient and/or family; these include but are not limited to the risk of perforation, infection, bleeding, missed lesions, lack of diagnosis, severe illness requiring hospitalization, as well as anesthesia and sedation related illnesses.  The patient's history has been reviewed, patient examined, no change in status, and deemed stable for procedure.  The patient and/or family is agreeable to proceed.     Aloha Finner, MD [facility] Office # [phone]

## 2023-07-04 NOTE — Op Note (Signed)
 Travis Myers Patient Name: Travis Myers Procedure Date: 07/04/2023 8:56 AM MRN: [id number] Endoscopist: Aloha Finner , MD, [id number] Age: 68 Referring MD:  Date of Birth: December 10, 1955 Gender: Male Account #: [id number] Procedure:                Colonoscopy Indications:              Surveillance: Personal history of adenomatous                            polyps on last colonoscopy > 5 years ago Medicines:                Monitored Anesthesia Care Procedure:                Pre-Anesthesia Assessment:                           - Prior to the procedure, a History and Physical                            was performed, and patient medications and                            allergies were reviewed. The patient's tolerance of                            previous anesthesia was also reviewed. The risks                            and benefits of the procedure and the sedation                            options and risks were discussed with the patient.                            All questions were answered, and informed consent                            was obtained. Prior Anticoagulants: The patient has                            taken no anticoagulant or antiplatelet agents. ASA                            Grade Assessment: III - A patient with severe                            systemic disease. After reviewing the risks and                            benefits, the patient was deemed in satisfactory                            condition to undergo the procedure.  After obtaining informed consent, the colonoscope                            was passed under direct vision. Throughout the                            procedure, the patient's blood pressure, pulse, and                            oxygen saturations were monitored continuously. The                            Olympus Scope SN: [id number] was introduced through                            the anus and  advanced to the 3 cm into the ileum.                            The colonoscopy was performed without difficulty.                            The patient tolerated the procedure. The quality of                            the bowel preparation was adequate. The terminal                            ileum, ileocecal valve, appendiceal orifice, and                            rectum were photographed. Scope In: 9:09:24 AM Scope Out: 9:25:35 AM Scope Withdrawal Time: 0 hours 14 minutes 9 seconds  Total Procedure Duration: 0 hours 16 minutes 11 seconds  Findings:                 The digital rectal exam was normal. Pertinent                            negatives include no palpable rectal lesions.                           The terminal ileum and ileocecal valve appeared                            normal.                           Three sessile polyps were found in the rectum,                            ascending colon and cecum. The polyps were 2 to 5                            mm in size. These polyps were removed with a cold  snare. Resection and retrieval were complete.                           Normal mucosa was found in the entire colon                            otherwise.                           Non-bleeding non-thrombosed internal hemorrhoids                            were found during retroflexion, during perianal                            exam and during digital exam. The hemorrhoids were                            Grade I (internal hemorrhoids that do not prolapse). Complications:            No immediate complications. Estimated Blood Loss:     Estimated blood loss was minimal. Impression:               - The examined portion of the ileum was normal.                           - Three 2 to 5 mm polyps in the rectum, in the                            ascending colon and in the cecum, removed with a                            cold snare. Resected and  retrieved.                           - Normal mucosa in the entire examined colon                            otherwise.                           - Non-bleeding non-thrombosed internal hemorrhoids. Recommendation:           - The patient will be observed post-procedure,                            until all discharge criteria are met.                           - Discharge patient to home.                           - Patient has a contact number available for                            emergencies. The signs and symptoms  of potential                            delayed complications were discussed with the                            patient. Return to normal activities tomorrow.                            Written discharge instructions were provided to the                            patient.                           - High fiber diet.                           - Use FiberCon 1-2 tablets PO daily.                           - Continue present medications.                           - Await pathology results.                           - Repeat colonoscopy in 3/5/7 years for                            surveillance based on pathology results and history                            of previous adenomatous colon polyps.                           - The findings and recommendations were discussed                            with the patient.                           - The findings and recommendations were discussed                            with the patient's family. Aloha Finner, MD 07/04/2023 9:33:15 AM

## 2023-07-04 NOTE — Progress Notes (Signed)
 Called to room to assist during endoscopic procedure.  Patient ID and intended procedure confirmed with present staff. Received instructions for my participation in the procedure from the performing physician.

## 2023-07-05 ENCOUNTER — Telehealth: Payer: Self-pay

## 2023-07-05 NOTE — Telephone Encounter (Signed)
 Follow up call to pt, lm for pt to call if having any difficulty with normal activities or eating and drinking.  Also to call if any other questions or concerns.

## 2023-07-06 ENCOUNTER — Ambulatory Visit: Payer: Self-pay | Admitting: Gastroenterology

## 2023-07-06 LAB — SURGICAL PATHOLOGY

## 2023-07-17 ENCOUNTER — Ambulatory Visit (INDEPENDENT_AMBULATORY_CARE_PROVIDER_SITE_OTHER): Admitting: Podiatry

## 2023-07-17 DIAGNOSIS — I739 Peripheral vascular disease, unspecified: Secondary | ICD-10-CM | POA: Diagnosis not present

## 2023-07-17 DIAGNOSIS — B351 Tinea unguium: Secondary | ICD-10-CM | POA: Diagnosis not present

## 2023-07-17 DIAGNOSIS — M79674 Pain in right toe(s): Secondary | ICD-10-CM | POA: Diagnosis not present

## 2023-07-17 DIAGNOSIS — L84 Corns and callosities: Secondary | ICD-10-CM

## 2023-07-17 DIAGNOSIS — M79675 Pain in left toe(s): Secondary | ICD-10-CM

## 2023-07-17 NOTE — Progress Notes (Signed)
Subjective:  Patient ID: Travis Myers, male    DOB: 1955/05/15,  MRN: [id number]   Travis JINNY Grayce Myers presents to clinic today for:  Chief Complaint  Patient presents with   [facility]    Not diabetic, takes ASA 81 mg. Has some callus on ball of right foot. Needs nails trimmed.    Patient notes nails are thick, discolored, elongated and painful in shoegear when trying to ambulate.  Also has painful calluses bilateral submet 5 and lateral aspect right fifth met base.  He notes that he was told he is no longer prediabetic.  However he states his peripheral neuropathy is pretty uncomfortable.  Gabapentin  caused significant weight gain so he no longer takes this.  He currently takes Lyrica  and states it has not been very helpful.  States his blood sugars under good control.  He would not be a candidate for Qutenza since he is stating that he is no longer diabetic.  PCP is Montey Lot, PA-C.  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   ASYMPTOMATIC   Heart murmur MILD-- ASYMPTOMATIC   Hyperlipidemia    Hypertension    Lymph edema    bilateral legs- stockings on both legs   Neuromuscular disorder (HCC)    ltmphadema neuropathy feet and toes   OA (osteoarthritis) of knee left   OSA (obstructive sleep apnea) NON-TOLERANT CPAP   no cpap PT REPORTS NEVER FINISHED TEST    Pneumonia    Pre-diabetes    Sleep apnea    Tinnitus of both ears     Past Surgical History:  Procedure Laterality Date   BILATERAL IINGUINAL HERNIA REPAIR  1963   DOPPLER ECHOCARDIOGRAPHY  10/18/2011   EF >55 %   ENDOVENOUS ABLATION SAPHENOUS VEIN W/ LASER Right 12/30/2020   endovenous laser ablation right greater saphenous vein by Medford Blade MD   EXCISIONAL TOTAL  KNEE ARTHROPLASTY WITH ANTIBIOTIC SPACERS Right 07/22/2021   Procedure: EXCISIONAL TOTAL KNEE ARTHROPLASTY WITH ANTIBIOTIC SPACERS;  Surgeon: Ernie Cough, MD;  Location: [facility];  Service: Orthopedics;  Laterality: Right;   INCISION AND DRAINAGE OF WOUND Right 04/27/2021   Procedure: IRRIGATION AND DEBRIDEMENT OF HEMATOMA AND ABCESS ,ASPIRATE RIGHT KNEE;  Surgeon: Gerome Charleston, MD;  Location: [facility];  Service: Orthopedics;  Laterality: Right;  REQUEST 3:30PM START TIME   LUMBAR LAMINECTOMY/DECOMPRESSION MICRODISCECTOMY N/A 02/24/2016   Procedure: Bilateral Decompression L4-5, Discectomy L4-5 ;  Surgeon: Reyes Billing, MD;  Location: [facility];  Service: Orthopedics;  Laterality: N/A;   NM MYOCAR PERF WALL MOTION  10/13/2011   EF 49 %,low risk study   REIMPLANTATION OF TOTAL KNEE Right 09/30/2021   Procedure: REIMPLANTATION / REVISON OF TOTAL KNEE;  Surgeon: Ernie Cough, MD;  Location: [facility];  Service: Orthopedics;  Laterality: Right;  150   REVERSE SHOULDER ARTHROPLASTY Right 02/16/2023   Procedure: REVERSE SHOULDER ARTHROPLASTY;  Surgeon: Melita Drivers, MD;  Location: [facility];  Service: Orthopedics;  Laterality: Right;  120 min   right elbow surgery     ulnar nerve transposiiton   SHOULDER ARTHROSCOPY Left 2022   TONSILLECTOMY  1965   TORN MENISCUS X 3 IN KNEES SURGERY  LEFT    TOTAL HIP ARTHROPLASTY Left 06/18/2009   OA LEFT HIP   TOTAL HIP ARTHROPLASTY Right 03/11/2014   Procedure: RIGHT TOTAL HIP ARTHROPLASTY ANTERIOR APPROACH;  Surgeon: Donnice JONETTA Car, MD;  Location: [facility];  Service: Orthopedics;  Laterality: Right;   TOTAL KNEE ARTHROPLASTY Right 02/26/2020   Procedure: TOTAL KNEE ARTHROPLASTY;  Surgeon: Gerome Charleston, MD;  Location: [facility];  Service: Orthopedics;  Laterality: Right;  adductor canal   TOTAL KNEE ARTHROPLASTY Left 08/23/2022   Procedure: TOTAL KNEE ARTHROPLASTY;  Surgeon: Car Donnice, MD;  Location: [facility];  Service: Orthopedics;  Laterality: Left;    TRANSTHORACIC ECHOCARDIOGRAM  09/11/2012   EF 55 to 60 %,mild aortic valve regurg,mild mitral valve regurg,lf and rt atriums mildly dilated   VARICOSE VEIN SURGERY Left 1980   and 1994    Allergies  Allergen Reactions   Ciprofloxacin Other (See Comments)    Unknown   Penicillins Other (See Comments)    Childhood reaction- not recalled  Tolerated Cephalosporin Date: 08/24/22.   Quinolones Other (See Comments)    Patient was warned about not using Cipro and similar antibiotics. Recent studies have raised concern that fluoroquinolone antibiotics could be associated with an increased risk of aortic aneurysm Fluoroquinolones have non-antimicrobial properties that might jeopardise the integrity of the extracellular matrix of the vascular wall In a  propensity score matched cohort study in [place], there was a 66% increased rate of aortic aneurysm or dissection associated with oral fluoroquinolone use, compared wit    Review of Systems: Negative except as noted in the HPI.  Objective:  Travis Myers is a pleasant 68 y.o. male in NAD. AAO x 3.  Vascular Examination: Capillary refill time is 3-5 seconds to toes bilateral.  2/4 DP, 0/4 PT palpable pedal pulses b/l LE. Digital hair sparse b/l.  Skin temperature gradient WNL b/l.  +1 pitting edema bilateral with hemosiderin deposition in legs  Dermatological Examination: Pedal skin with abnormal turgor, texture and tone b/l. No open wounds. No interdigital macerations b/l. Toenails x10 are 3mm thick, discolored, dystrophic with subungual debris. There is pain with compression of the nail plates.  They are elongated x10.  There are hyperkeratotic lesions bilateral submet 5, as well as a small hyperkeratotic lesion on the lateral aspect of the right fifth metatarsal base  Assessment/Plan: 1. Pain due to onychomycosis of toenails of both feet   2. Callus of foot   3. PVD (peripheral vascular disease) (HCC)    The mycotic toenails were  sharply debrided x10 with sterile nail nippers and a power debriding burr to decrease bulk/thickness and length.    The hyperkeratotic lesions x 3 were shaved with a sterile #313 blade uneventfully.  All lesions were proximal to the toes  Patient states he has an external bone stimulator at home for a recent shoulder fracture.  He plans on trying this out on his feet to see if this helps the neuropathy.  Return in about 3 months (around 10/17/2023) for RFC.   Awanda CHARM Imperial, DPM, FACFAS [facility]     [address] [place], [place] [address]                Office [phone]  Fax [phone])  [phone] 

## 2023-07-27 ENCOUNTER — Encounter: Payer: Self-pay | Admitting: Neurology

## 2023-07-30 ENCOUNTER — Other Ambulatory Visit: Payer: Self-pay | Admitting: Cardiovascular Disease

## 2023-08-09 ENCOUNTER — Encounter: Payer: Self-pay | Admitting: Physician Assistant

## 2023-08-09 ENCOUNTER — Other Ambulatory Visit: Payer: Self-pay | Admitting: Physician Assistant

## 2023-08-09 DIAGNOSIS — R609 Edema, unspecified: Secondary | ICD-10-CM

## 2023-08-14 ENCOUNTER — Ambulatory Visit
Admission: RE | Admit: 2023-08-14 | Discharge: 2023-08-14 | Disposition: A | Discharge status: Home / Self Care | Source: Ambulatory Visit | Attending: Physician Assistant | Admitting: Physician Assistant

## 2023-08-14 DIAGNOSIS — R609 Edema, unspecified: Secondary | ICD-10-CM

## 2023-08-15 ENCOUNTER — Encounter: Payer: Self-pay | Admitting: Neurology

## 2023-08-15 ENCOUNTER — Other Ambulatory Visit

## 2023-08-15 ENCOUNTER — Ambulatory Visit
Admission: RE | Admit: 2023-08-15 | Discharge: 2023-08-15 | Disposition: A | Discharge status: Home / Self Care | Source: Ambulatory Visit | Attending: Physician Assistant | Admitting: Physician Assistant

## 2023-08-15 ENCOUNTER — Ambulatory Visit (INDEPENDENT_AMBULATORY_CARE_PROVIDER_SITE_OTHER): Admitting: Neurology

## 2023-08-15 ENCOUNTER — Telehealth: Payer: Self-pay

## 2023-08-15 VITALS — BP 162/78 | HR 75 | Ht 76.0 in | Wt 334.0 lb

## 2023-08-15 DIAGNOSIS — M79604 Pain in right leg: Secondary | ICD-10-CM | POA: Diagnosis not present

## 2023-08-15 DIAGNOSIS — R202 Paresthesia of skin: Secondary | ICD-10-CM

## 2023-08-15 DIAGNOSIS — M79605 Pain in left leg: Secondary | ICD-10-CM | POA: Diagnosis not present

## 2023-08-15 DIAGNOSIS — R292 Abnormal reflex: Secondary | ICD-10-CM

## 2023-08-15 MED ORDER — IOPAMIDOL (ISOVUE-370) INJECTION 76%
75.0000 mL | Freq: Once | INTRAVENOUS | Status: AC | PRN
  Administered 2023-08-15: 75 mL via INTRAVENOUS

## 2023-08-15 MED ORDER — DIAZEPAM 5 MG PO TABS: ORAL_TABLET | ORAL | 0 refills | Status: DC

## 2023-08-15 NOTE — Telephone Encounter (Signed)
 Called pt and told him an mri with general an wouldn't be available until Dec. I told him he could get an open MRI at [facility] and he agreed.

## 2023-08-15 NOTE — Patient Instructions (Signed)
 MRI lumbar spine without contrast Nerve testing of the right arm Check labs  ELECTROMYOGRAM AND NERVE CONDUCTION STUDIES (EMG/NCS) INSTRUCTIONS  How to Prepare The neurologist conducting the EMG will need to know if you have certain medical conditions. Tell the neurologist and other EMG lab personnel if you: Have a pacemaker or any other electrical medical device Take blood-thinning medications Have hemophilia, a blood-clotting disorder that causes prolonged bleeding Bathing Take a shower or bath shortly before your exam in order to remove oils from your skin. Don't apply lotions or creams before the exam.  What to Expect You'll likely be asked to change into a hospital gown for the procedure and lie down on an examination table. The following explanations can help you understand what will happen during the exam.  Electrodes. The neurologist or a technician places surface electrodes at various locations on your skin depending on where you're experiencing symptoms. Or the neurologist may insert needle electrodes at different sites depending on your symptoms.  Sensations. The electrodes will at times transmit a tiny electrical current that you may feel as a twinge or spasm. The needle electrode may cause discomfort or pain that usually ends shortly after the needle is removed. If you are concerned about discomfort or pain, you may want to talk to the neurologist about taking a short break during the exam.  Instructions. During the needle EMG, the neurologist will assess whether there is any spontaneous electrical activity when the muscle is at rest - activity that isn't present in healthy muscle tissue - and the degree of activity when you slightly contract the muscle.  He or she will give you instructions on resting and contracting a muscle at appropriate times. Depending on what muscles and nerves the neurologist is examining, he or she may ask you to change positions during the exam.  After your  EMG You may experience some temporary, minor bruising where the needle electrode was inserted into your muscle. This bruising should fade within several days. If it persists, contact your primary care doctor.

## 2023-08-15 NOTE — Progress Notes (Unsigned)
 [facility] Clinic Note - Initial Visit   Date: 08/15/2023   Travis Myers MRN: [id number] DOB: Jan 17, 1955   Dear Dr. Bonner:  Thank you for your kind referral of Travis Myers for consultation of bilateral feet pain. Although his history is well known to you, please allow us  to reiterate it for the purpose of our medical record. The patient was accompanied to the clinic by wife who also provides collateral information.     Travis Myers is a 68 y.o. right-handed male with hyperlipidemia, lymphedema, AAA, hypertension,  presenting for evaluation of ***.   IMPRESSION/PLAN: ****  - MRI lumbar spine wo contrast under general anesthesia   - Check vitamin B12, folate, SPEP with IFE  - NCS/EMG of right arm  Return to clinic in ***  ------------------------------------------------------------- History of present illness: *** Starting around early 2025, he began having numbness involving the balls of the feet and toes, which is constant.  ***Occasionally, he also has numbness involving the heel.   He has severe pain in the knees and thighs, which feels like someone squeezing the knees.  His pain has been severe over the past three weeks.  He has mild low back pain.  He started using a cane three weeks ***.  He had right ulnar decompression and residual numbness of the 4th and 5th finger.  Over the past few weeks, he also has right thumb and middle finger numbness.  ***.     Out-side paper records, electronic medical record, and images have been reviewed where available and summarized as:  Lab Results  Component Value Date   HGBA1C 5.6 07/20/2021   Lab Results  Component Value Date   ESRSEDRATE 10 09/24/2021    Past Medical History:  Diagnosis Date   Acute meniscal tear of knee left   Anemia    Aneurysm, ascending aorta (HCC) 4.8cm per ct 2011/ CARDIOLOGSIT--  DR MAYE  LOV  10-07-2011  NOTE W/ CHART AND REQUEST  [facility]  MYOVIEW AND ECHO TO BE FAXED.   ASYMPTOMATIC   Heart murmur MILD-- ASYMPTOMATIC   Hyperlipidemia    Hypertension    Lymph edema    bilateral legs- stockings on both legs   Neuromuscular disorder (HCC)    ltmphadema neuropathy feet and toes   OA (osteoarthritis) of knee left   OSA (obstructive sleep apnea) NON-TOLERANT CPAP   no cpap PT REPORTS NEVER FINISHED TEST    Pneumonia    Pre-diabetes    Sleep apnea    Tinnitus of both ears     Past Surgical History:  Procedure Laterality Date   BILATERAL IINGUINAL HERNIA REPAIR  1963   DOPPLER ECHOCARDIOGRAPHY  10/18/2011   EF >55 %   ENDOVENOUS ABLATION SAPHENOUS VEIN W/ LASER Right 12/30/2020   endovenous laser ablation right greater saphenous vein by Medford Blade MD   EXCISIONAL TOTAL KNEE ARTHROPLASTY WITH ANTIBIOTIC SPACERS Right 07/22/2021   Procedure: EXCISIONAL TOTAL KNEE ARTHROPLASTY WITH ANTIBIOTIC SPACERS;  Surgeon: Ernie Cough, MD;  Location: [facility];  Service: Orthopedics;  Laterality: Right;   INCISION AND DRAINAGE OF WOUND Right 04/27/2021   Procedure: IRRIGATION AND DEBRIDEMENT OF HEMATOMA AND ABCESS ,ASPIRATE RIGHT KNEE;  Surgeon: Gerome Charleston, MD;  Location: [facility];  Service: Orthopedics;  Laterality: Right;  REQUEST 3:30PM START TIME   LUMBAR LAMINECTOMY/DECOMPRESSION MICRODISCECTOMY N/A 02/24/2016   Procedure: Bilateral Decompression L4-5, Discectomy L4-5 ;  Surgeon: Reyes Billing, MD;  Location: [facility];  Service: Orthopedics;  Laterality:  N/A;   NM MYOCAR PERF WALL MOTION  10/13/2011   EF 49 %,low risk study   REIMPLANTATION OF TOTAL KNEE Right 09/30/2021   Procedure: REIMPLANTATION / REVISON OF TOTAL KNEE;  Surgeon: Ernie Cough, MD;  Location: [facility];  Service: Orthopedics;  Laterality: Right;  150   REVERSE SHOULDER ARTHROPLASTY Right 02/16/2023   Procedure: REVERSE SHOULDER ARTHROPLASTY;  Surgeon: Melita Drivers, MD;  Location: [facility];  Service: Orthopedics;  Laterality: Right;  120 min   right elbow  surgery     ulnar nerve transposiiton   SHOULDER ARTHROSCOPY Left 2022   TONSILLECTOMY  1965   TORN MENISCUS X 3 IN KNEES SURGERY      LEFT    TOTAL HIP ARTHROPLASTY Left 06/18/2009   OA LEFT HIP   TOTAL HIP ARTHROPLASTY Right 03/11/2014   Procedure: RIGHT TOTAL HIP ARTHROPLASTY ANTERIOR APPROACH;  Surgeon: Cough JONETTA Ernie, MD;  Location: [facility];  Service: Orthopedics;  Laterality: Right;   TOTAL KNEE ARTHROPLASTY Right 02/26/2020   Procedure: TOTAL KNEE ARTHROPLASTY;  Surgeon: Gerome Charleston, MD;  Location: [facility];  Service: Orthopedics;  Laterality: Right;  adductor canal   TOTAL KNEE ARTHROPLASTY Left 08/23/2022   Procedure: TOTAL KNEE ARTHROPLASTY;  Surgeon: Ernie Cough, MD;  Location: [facility];  Service: Orthopedics;  Laterality: Left;   TRANSTHORACIC ECHOCARDIOGRAM  09/11/2012   EF 55 to 60 %,mild aortic valve regurg,mild mitral valve regurg,lf and rt atriums mildly dilated   VARICOSE VEIN SURGERY Left 1980   and 1994     Medications:  Outpatient Encounter Medications as of 08/15/2023  Medication Sig Note   acetaminophen  (TYLENOL ) 500 MG tablet Take 1,000 mg by mouth 3 (three) times daily. Takes with tramadol  02/16/2023: Took 500 mg po   Ascorbic Acid (VITAMIN C ) 1000 MG tablet Take 1,000 mg by mouth daily.    atorvastatin  (LIPITOR) 40 MG tablet TAKE 1 TABLET BY MOUTH EVERY DAY    cyanocobalamin (VITAMIN B12) 1000 MCG tablet Take 1,000 mcg by mouth daily.    furosemide  (LASIX ) 20 MG tablet Take 40 mg by mouth in the morning. Additional 20 mg if needed for fluid    meclizine  (ANTIVERT ) 25 MG tablet Take 25 mg by mouth 3 (three) times daily as needed for dizziness.    methocarbamol  (ROBAXIN ) 500 MG tablet Take 1 tablet (500 mg total) by mouth 3 (three) times daily as needed for muscle spasms.    Polyethyl Glycol-Propyl Glycol (LUBRICATING EYE DROPS OP) Place 1 drop into both eyes daily as needed (dry eyes).    pregabalin  (LYRICA ) 75 MG capsule Take 75 mg by mouth 2 (two) times daily.     traMADol  (ULTRAM ) 50 MG tablet Take 100 mg by mouth 3 (three) times daily.    triamcinolone  (KENALOG ) 0.1 % Apply 1 application  topically once a week.    valsartan (DIOVAN) 320 MG tablet Take 320 mg by mouth daily.    aspirin  81 MG chewable tablet Chew 81 mg by mouth daily.    diclofenac (VOLTAREN) 75 MG EC tablet Take 75 mg by mouth 2 (two) times daily. (Patient not taking: Reported on 08/15/2023)    traZODone  (DESYREL ) 50 MG tablet Take 50 mg by mouth at bedtime. (Patient not taking: Reported on 08/15/2023)    No facility-administered encounter medications on file as of 08/15/2023.    Allergies:  Allergies  Allergen Reactions   Ciprofloxacin Other (See Comments)    Unknown   Penicillins Other (See Comments)    Childhood reaction-  not recalled  Tolerated Cephalosporin Date: 08/24/22.   Quinolones Other (See Comments)    Patient was warned about not using Cipro and similar antibiotics. Recent studies have raised concern that fluoroquinolone antibiotics could be associated with an increased risk of aortic aneurysm Fluoroquinolones have non-antimicrobial properties that might jeopardise the integrity of the extracellular matrix of the vascular wall In a  propensity score matched cohort study in [place], there was a 66% increased rate of aortic aneurysm or dissection associated with oral fluoroquinolone use, compared wit    Family History: Family History  Problem Relation Age of Onset   Diabetes Mother    Cancer Mother    Heart failure Other    Diabetes Other    Cancer Other    COPD Other    Colon cancer Neg Hx    Stomach cancer Neg Hx    Rectal cancer Neg Hx    Esophageal cancer Neg Hx     Social History: Social History   Tobacco Use   Smoking status: Never   Smokeless tobacco: Former    Quit date: 09/11/1988  Vaping Use   Vaping status: Never Used  Substance Use Topics   Alcohol use: Not Currently    Alcohol/week: 1.0 standard drink of alcohol    Types: 1 Standard drinks  or equivalent per week    Comment: RARE   Drug use: No   Social History   Social History Narrative   Living w/ wife, horses--1 story 4 steps   Right hand       Vital Signs:  BP (!) 162/78 (BP Location: Left Arm, Patient Position: Sitting, Cuff Size: Large)   Pulse 75   Ht 6' 4 (1.93 m)   Wt (!) 334 lb (151.5 kg)   SpO2 97%   BMI 40.66 kg/m     Neurological Exam: MENTAL STATUS including orientation to time, place, person, recent and remote memory, attention span and concentration, language, and fund of knowledge is normal.  Speech is not dysarthric.  CRANIAL NERVES: Myers:  No visual field defects.     III-IV-VI: Pupils equal round and reactive to light.  Normal conjugate, extra-ocular eye movements in all directions of gaze.  No nystagmus.  No ptosis.   V:  Normal facial sensation.    VII:  Normal facial symmetry and movements.   VIII:  Normal hearing and vestibular function.   IX-X:  Normal palatal movement.   XI:  Normal shoulder shrug and head rotation.   XII:  Normal tongue strength and range of motion, no deviation or fasciculation.  MOTOR:  Marked lymphedema in the lower legs.  *** No atrophy, fasciculations or abnormal movements.  No pronator drift.   Upper Extremity:  Right  Left  Deltoid  5/5   5/5   Biceps  5/5   5/5   Triceps  5/5   5/5   Wrist extensors  5/5   5/5   Wrist flexors  5/5   5/5   Finger extensors  5/5   5/5   Finger flexors  5/5   5/5   Dorsal interossei  5/5   5/5   Abductor pollicis  5/5   5/5   Tone (Ashworth scale)  0  0   Lower Extremity:  Right  Left  Hip flexors  5/5   5/5   Knee flexors  5/5   5/5   Knee extensors  5/5   5/5   Dorsiflexors  5/5   5/5   Plantarflexors  5/5   5/5   Toe extensors  5/5   5/5   Toe flexors  5-/5   5/5   Tone (Ashworth scale)  0  0   MSRs:                                           Right        Left brachioradialis 2+  2+  biceps 2+  2+  triceps 2+  2+  patellar 3+  3+  ankle jerk 0  1+   Hoffman no  no  plantar response down  down   SENSORY: Temperature and pin prick is intact throughout including in the feet.   Vibration absent below the ankles.  COORDINATION/GAIT: Normal finger-to- nose-finger.  Intact rapid alternating movements bilaterally.  Gait is wide-based, assisted with a cane. Slow, antalgic appearing.    Thank you for allowing me to participate in patient's care.  If I can answer any additional questions, I would be pleased to do so.    Sincerely,    Tavien Chestnut K. Tobie, DO

## 2023-08-16 ENCOUNTER — Telehealth: Payer: Self-pay | Admitting: Neurology

## 2023-08-16 NOTE — Telephone Encounter (Signed)
 Left a message with the After hour service in  08-16-23   Caller states that he was seen yesterday and a MRI was ordered and it sch for 08-25-23  He will needs medication prior to the appt

## 2023-08-18 ENCOUNTER — Encounter: Payer: Self-pay | Admitting: Neurology

## 2023-08-19 LAB — PROTEIN ELECTROPHORESIS, SERUM
Albumin ELP: 4.6 g/dL (ref 3.8–4.8)
Alpha 1: 0.3 g/dL (ref 0.2–0.3)
Alpha 2: 0.8 g/dL (ref 0.5–0.9)
Beta 2: 0.3 g/dL (ref 0.2–0.5)
Beta Globulin: 0.4 g/dL (ref 0.4–0.6)
Gamma Globulin: 0.5 g/dL — ABNORMAL LOW (ref 0.8–1.7)
Total Protein: 6.9 g/dL (ref 6.1–8.1)

## 2023-08-19 LAB — IMMUNOFIXATION ELECTROPHORESIS
IgG (Immunoglobin G), Serum: 628 mg/dL (ref 600–1540)
IgM, Serum: 41 mg/dL — ABNORMAL LOW (ref 50–300)
Immunoglobulin A: 78 mg/dL (ref 70–320)

## 2023-08-19 LAB — B12 AND FOLATE PANEL
Folate: 7 ng/mL
Vitamin B-12: 788 pg/mL (ref 200–1100)

## 2023-08-24 ENCOUNTER — Ambulatory Visit (INDEPENDENT_AMBULATORY_CARE_PROVIDER_SITE_OTHER): Admitting: Neurology

## 2023-08-24 DIAGNOSIS — G629 Polyneuropathy, unspecified: Secondary | ICD-10-CM

## 2023-08-24 NOTE — Procedures (Signed)
 [facility]  [address], [address]  [place], [place] [address] Tel: [phone] Fax: [phone] Test Date:  08/24/2023  Patient: Travis Myers, Travis Myers DOB: 05/15/1955 Physician: Tonita Blanch, DO  Sex: Male Height: 6' 4 Ref Phys: Tonita Blanch, DO  ID#: [id number]   Technician:    History: This is a 67 year old man referred for evaluation of bilateral hand numbness and tingling.  NCV & EMG Findings: Extensive electrodiagnostic testing of the right upper extremity and additional studies of the left shows:  Bilateral median sensory responses are absent.  Bilateral ulnar sensory responses show prolonged latency (R6.2, L3.8 ms).  Bilateral radial sensory responses show reduced amplitude (R6.9, L5.9 V).   Bilateral median and ulnar motor responses show show prolonged latency, reduced amplitude, and slowed conduction velocity along the course of the nerve. Chronic motor axon loss changes are seen affecting the distal hand muscles, without accompanying active denervation, which is more pronounced in bilateral abductor pollicis brevis muscles.  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   ___________________________ Tonita Blanch, DO    Nerve Conduction Studies   Stim Site NR Peak (ms) Norm Peak (ms) O-P Amp (V) Norm O-P Amp  Left Median Anti Sensory (2nd Digit)  32 C  Wrist *NR  <3.8  >10  Right Median Anti Sensory (2nd Digit)  32 C  Wrist *NR  <3.8  >10  Left Radial Anti Sensory (Base 1st Digit)  32 C  Wrist    2.7 <2.8 *5.9 >10  Right Radial Anti Sensory (Base 1st Digit)  32 C  Wrist    2.4 <2.8 *6.9 >10  Left Ulnar Anti Sensory (5th Digit)  32 C  Wrist    *3.8 <3.2 5.0 >5  Right Ulnar Anti Sensory (5th Digit)  32 C  Wrist    *6.2 <3.2 6.5 >5     Stim Site NR Onset (ms) Norm Onset (ms) O-P Amp (mV) Norm O-P Amp Site1 Site2 Delta-0 (ms)  Dist (cm) Vel (m/s) Norm Vel (m/s)  Left Median Motor (Abd Poll Brev)  32 C  Wrist    *7.3 <4.0 *2.1 >5 Elbow Wrist 8.3 32.0 *39 >50  Elbow    15.6  1.7         Right Median Motor (Abd Poll Brev)  32 C  Wrist    *8.2 <4.0 *1.1 >5 Elbow Wrist 7.7 33.0 *43 >50  Elbow    15.9  0.9         Left Ulnar Motor (Abd Dig Minimi)  32 C  Wrist    2.9 <3.1 *5.7 >7 B Elbow Wrist 5.5 23.0 *42 >50  B Elbow    8.4  5.1  A Elbow B Elbow 2.3 10.0 *43 >50  A Elbow    10.7  5.1         Right Ulnar Motor (Abd Dig Minimi)  32 C  Wrist    *3.6 <3.1 *5.5 >7 B Elbow Wrist 5.2 24.0 *46 >50  B Elbow    8.8  4.7  A Elbow B Elbow 2.1 10.0 *48 >50  A Elbow    10.9  4.6          Electromyography   Side Muscle Ins.Act Fibs Fasc Recrt Amp Dur Poly Activation Comment  Right 1stDorInt Nml Nml Nml *1- *1+ *1+ *1+ Nml N/A  Right Abd Poll Brev Nml Nml Nml *3- *1+ *1+ *1+ Nml N/A  Right PronatorTeres Nml Nml Nml Nml Nml Nml Nml Nml N/A  Right Biceps Nml Nml Nml Nml Nml Nml Nml Nml N/A  Right Triceps Nml Nml Nml Nml Nml Nml Nml Nml N/A  Right Deltoid Nml Nml Nml Nml Nml Nml Nml Nml N/A  Right Abd Dig Min Nml Nml Nml *1- *1+ *1+ *1+ Nml N/A  Left 1stDorInt Nml Nml Nml *1- *1+ *1+ *1+ Nml N/A  Left Abd Poll Brev Nml Nml Nml *3- *1+ *1+ *1+ Nml N/A  Left PronatorTeres Nml Nml Nml Nml Nml Nml Nml Nml N/A  Left Biceps Nml Nml Nml Nml Nml Nml Nml Nml N/A  Left Triceps Nml Nml Nml Nml Nml Nml Nml Nml N/A  Left Deltoid Nml Nml Nml Nml Nml Nml Nml Nml N/A      Waveforms:

## 2023-08-25 ENCOUNTER — Ambulatory Visit: Payer: Self-pay | Admitting: Neurology

## 2023-08-25 DIAGNOSIS — R292 Abnormal reflex: Secondary | ICD-10-CM

## 2023-08-25 DIAGNOSIS — G629 Polyneuropathy, unspecified: Secondary | ICD-10-CM

## 2023-08-25 DIAGNOSIS — M79604 Pain in right leg: Secondary | ICD-10-CM

## 2023-08-25 DIAGNOSIS — R202 Paresthesia of skin: Secondary | ICD-10-CM

## 2023-09-06 NOTE — Telephone Encounter (Signed)
 Please let pt know that MRI lumbar spine looks good and shows mild degenerative changes in the lumbar spine, no severe narrowing or nerve impingement to explain his leg pain.  We can look higher in the spine at his mid-back (thoracic spine) to see if there is any nerve compression at that level.  If agreeable, please order MRI thoracic spine wo contrast at [facility].   MRI lumbar spine 08/25/2023 at [facility]: 1.  Mild degenerative disc disease and mild to moderate facet joint arthritis.  2.  Mild spinal stenosis L2-3 with mild foraminal stenosis on the right.  3.  Moderate foraminal stenosis on the right at L3-4.  4.  Mild bilateral foraminal stenosis L4-5

## 2023-09-12 ENCOUNTER — Other Ambulatory Visit: Payer: Self-pay | Admitting: Neurology

## 2023-09-12 DIAGNOSIS — R292 Abnormal reflex: Secondary | ICD-10-CM

## 2023-09-19 ENCOUNTER — Ambulatory Visit: Admitting: Neurology

## 2023-09-26 ENCOUNTER — Telehealth: Payer: Self-pay | Admitting: Neurology

## 2023-09-26 DIAGNOSIS — R292 Abnormal reflex: Secondary | ICD-10-CM

## 2023-09-26 MED ORDER — DIAZEPAM 5 MG PO TABS: ORAL_TABLET | ORAL | 0 refills | Status: AC

## 2023-09-26 NOTE — Telephone Encounter (Signed)
 Called patient and informed him that Valium  has been sent. Patient verbalized understanding and had no further questions or concerns.

## 2023-09-26 NOTE — Telephone Encounter (Signed)
 Valium  has been sent to his pharmacy.

## 2023-09-26 NOTE — Telephone Encounter (Signed)
 Left a message with the after hour service on 09-26-23 at 1:27 pm   Caller states he has a MRI on Friday and is checking on the medication to be sent in

## 2023-10-04 ENCOUNTER — Encounter: Payer: Self-pay | Admitting: Vascular Surgery

## 2023-10-04 ENCOUNTER — Ambulatory Visit: Attending: Vascular Surgery | Admitting: Vascular Surgery

## 2023-10-04 VITALS — BP 137/93 | HR 94 | Ht 76.0 in | Wt 334.0 lb

## 2023-10-04 DIAGNOSIS — I872 Venous insufficiency (chronic) (peripheral): Secondary | ICD-10-CM | POA: Diagnosis present

## 2023-10-04 NOTE — Progress Notes (Signed)
 Patient ID: Travis Myers, male   DOB: 31-Aug-1955, 68 y.o.   MRN: [id number]  Reason for Consult: Follow-up   Referred by Montey Lot, PA-C  Subjective:     HPI:  Travis Myers is a 68 y.o. male with extensive past history of right lower extremity pain and swelling and history of left greater saphenous vein stripping many years ago.  Patient has at least 3 previous knee operations on the right and 1 on the left.  He has undergone laser ablation of the right greater saphenous vein in 2022 which unfortunately recanalized and he is also known to have deep venous reflux.  In the past he was considered for high ligation and stripping of the right great saphenous vein.  He has been compliant with thigh-high compression stockings although has been unable to use his lymphedema pumps secondary to rotator cuff tear that occurred in the interim since our last evaluation.  He does have a small wound on the left anterior leg secondary to low-grade trauma no wounds on the right lower extremity but continues to have severe pain and swelling with skin changes consistent with C4 venous disease.  He continues to walk with the help of a cane.  He has lost 8 pounds in the past 2 months with controlling his diet.  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   ASYMPTOMATIC   Heart murmur MILD-- ASYMPTOMATIC   Hyperlipidemia    Hypertension    Lymph edema    bilateral legs- stockings on both legs   Neuromuscular disorder (HCC)    ltmphadema neuropathy feet and toes   OA (osteoarthritis) of knee left   OSA (obstructive sleep apnea) NON-TOLERANT CPAP   no cpap PT REPORTS NEVER FINISHED TEST    Pneumonia    Pre-diabetes    Sleep apnea    Tinnitus of both ears    Family History  Problem  Relation Age of Onset   Diabetes Mother    Cancer Mother    Heart failure Other    Diabetes Other    Cancer Other    COPD Other    Colon cancer Neg Hx    Stomach cancer Neg Hx    Rectal cancer Neg Hx    Esophageal cancer Neg Hx    Past Surgical History:  Procedure Laterality Date   BILATERAL IINGUINAL HERNIA REPAIR  1963   DOPPLER ECHOCARDIOGRAPHY  10/18/2011   EF >55 %   ENDOVENOUS ABLATION SAPHENOUS VEIN W/ LASER Right 12/30/2020   endovenous laser ablation right greater saphenous vein by Medford Blade MD   EXCISIONAL TOTAL KNEE ARTHROPLASTY WITH ANTIBIOTIC SPACERS Right 07/22/2021   Procedure: EXCISIONAL TOTAL KNEE ARTHROPLASTY WITH ANTIBIOTIC SPACERS;  Surgeon: Ernie Cough, MD;  Location: [facility];  Service: Orthopedics;  Laterality: Right;   INCISION AND DRAINAGE OF WOUND Right 04/27/2021   Procedure: IRRIGATION AND DEBRIDEMENT OF HEMATOMA AND ABCESS ,ASPIRATE RIGHT KNEE;  Surgeon: Gerome Charleston, MD;  Location: [facility];  Service: Orthopedics;  Laterality: Right;  REQUEST 3:30PM START TIME   LUMBAR LAMINECTOMY/DECOMPRESSION MICRODISCECTOMY N/A 02/24/2016   Procedure: Bilateral Decompression L4-5, Discectomy L4-5 ;  Surgeon: Reyes Billing, MD;  Location: [facility];  Service: Orthopedics;  Laterality: N/A;   NM MYOCAR PERF  WALL MOTION  10/13/2011   EF 49 %,low risk study   REIMPLANTATION OF TOTAL KNEE Right 09/30/2021   Procedure: REIMPLANTATION / REVISON OF TOTAL KNEE;  Surgeon: Ernie Cough, MD;  Location: [facility];  Service: Orthopedics;  Laterality: Right;  150   REVERSE SHOULDER ARTHROPLASTY Right 02/16/2023   Procedure: REVERSE SHOULDER ARTHROPLASTY;  Surgeon: Melita Drivers, MD;  Location: [facility];  Service: Orthopedics;  Laterality: Right;  120 min   right elbow surgery     ulnar nerve transposiiton   SHOULDER ARTHROSCOPY Left 2022   TONSILLECTOMY  1965   TORN MENISCUS X 3 IN KNEES SURGERY      LEFT    TOTAL HIP ARTHROPLASTY Left 06/18/2009   OA LEFT HIP   TOTAL HIP  ARTHROPLASTY Right 03/11/2014   Procedure: RIGHT TOTAL HIP ARTHROPLASTY ANTERIOR APPROACH;  Surgeon: Cough JONETTA Ernie, MD;  Location: [facility];  Service: Orthopedics;  Laterality: Right;   TOTAL KNEE ARTHROPLASTY Right 02/26/2020   Procedure: TOTAL KNEE ARTHROPLASTY;  Surgeon: Gerome Charleston, MD;  Location: [facility];  Service: Orthopedics;  Laterality: Right;  adductor canal   TOTAL KNEE ARTHROPLASTY Left 08/23/2022   Procedure: TOTAL KNEE ARTHROPLASTY;  Surgeon: Ernie Cough, MD;  Location: [facility];  Service: Orthopedics;  Laterality: Left;   TRANSTHORACIC ECHOCARDIOGRAM  09/11/2012   EF 55 to 60 %,mild aortic valve regurg,mild mitral valve regurg,lf and rt atriums mildly dilated   VARICOSE VEIN SURGERY Left 1980   and 1994    Short Social History:  Social History   Tobacco Use   Smoking status: Never   Smokeless tobacco: Former    Quit date: 09/11/1988  Substance Use Topics   Alcohol use: Not Currently    Alcohol/week: 1.0 standard drink of alcohol    Types: 1 Standard drinks or equivalent per week    Comment: RARE    Allergies  Allergen Reactions   Ciprofloxacin Other (See Comments)    Unknown   Penicillins Other (See Comments)    Childhood reaction- not recalled  Tolerated Cephalosporin Date: 08/24/22.   Quinolones Other (See Comments)    Patient was warned about not using Cipro and similar antibiotics. Recent studies have raised concern that fluoroquinolone antibiotics could be associated with an increased risk of aortic aneurysm Fluoroquinolones have non-antimicrobial properties that might jeopardise the integrity of the extracellular matrix of the vascular wall In a  propensity score matched cohort study in [place], there was a 66% increased rate of aortic aneurysm or dissection associated with oral fluoroquinolone use, compared wit    Current Outpatient Medications  Medication Sig Dispense Refill   acetaminophen  (TYLENOL ) 500 MG tablet Take 1,000 mg by mouth 3 (three) times  daily. Takes with tramadol      Ascorbic Acid (VITAMIN C ) 1000 MG tablet Take 1,000 mg by mouth daily.     aspirin  81 MG chewable tablet Chew 81 mg by mouth daily.     atorvastatin  (LIPITOR) 40 MG tablet TAKE 1 TABLET BY MOUTH EVERY DAY 15 tablet 0   cyanocobalamin (VITAMIN B12) 1000 MCG tablet Take 1,000 mcg by mouth daily.     diazepam  (VALIUM ) 5 MG tablet Take 1 tablet 30-min prior to MRI.  OK to repeat at facility if needed. Do not drive while taking this medication. 2 tablet 0   diclofenac (VOLTAREN) 75 MG EC tablet Take 75 mg by mouth 2 (two) times daily. (Patient not taking: Reported on 08/15/2023)     furosemide  (LASIX ) 20 MG tablet Take 40 mg  by mouth in the morning. Additional 20 mg if needed for fluid  5   meclizine  (ANTIVERT ) 25 MG tablet Take 25 mg by mouth 3 (three) times daily as needed for dizziness.     methocarbamol  (ROBAXIN ) 500 MG tablet Take 1 tablet (500 mg total) by mouth 3 (three) times daily as needed for muscle spasms. 30 tablet 1   Polyethyl Glycol-Propyl Glycol (LUBRICATING EYE DROPS OP) Place 1 drop into both eyes daily as needed (dry eyes).     pregabalin  (LYRICA ) 75 MG capsule Take 75 mg by mouth 2 (two) times daily.     traMADol  (ULTRAM ) 50 MG tablet Take 100 mg by mouth 3 (three) times daily.     traZODone  (DESYREL ) 50 MG tablet Take 50 mg by mouth at bedtime. (Patient not taking: Reported on 08/15/2023)     triamcinolone  (KENALOG ) 0.1 % Apply 1 application  topically once a week.     valsartan (DIOVAN) 320 MG tablet Take 320 mg by mouth daily.     No current facility-administered medications for this visit.    Review of Systems  Constitutional:  Constitutional negative. HENT: HENT negative.  Eyes: Eyes negative.  Cardiovascular: Positive for leg swelling.  GI: Gastrointestinal negative.  Musculoskeletal: Positive for gait problem, leg pain and joint pain.  Skin: Positive for wound.  Hematologic: Hematologic/lymphatic negative.  Psychiatric: Psychiatric  negative.        Objective:  Objective   Vitals:   10/04/23 1401  BP: (!) 137/93  Pulse: 94  SpO2: 94%  Weight: (!) 334 lb (151.5 kg)  Height: 6' 4 (1.93 m)   Body mass index is 40.66 kg/m.  Physical Exam Constitutional:      Appearance: He is obese.  HENT:     Head: Normocephalic.     Nose: Nose normal.  Eyes:     Pupils: Pupils are equal, round, and reactive to light.  Cardiovascular:     Rate and Rhythm: Normal rate.  Pulmonary:     Effort: Pulmonary effort is normal.  Abdominal:     General: Abdomen is flat.  Musculoskeletal:     Right lower leg: Edema present.     Left lower leg: Edema present.  Skin:    Capillary Refill: Capillary refill takes less than 2 seconds.  Neurological:     General: No focal deficit present.     Mental Status: He is alert.  Psychiatric:        Mood and Affect: Mood normal.        Data: Venous Reflux Times  +------------------+---------+------+-----------+------------+-------------  -+  RIGHT            Reflux NoRefluxReflux TimeDiameter cmsComments                                     Yes                                          +------------------+---------+------+-----------+------------+-------------  -+  CFV                        yes   >1 second                              +------------------+---------+------+-----------+------------+-------------  -+  FV mid                      yes   >1 second                              +------------------+---------+------+-----------+------------+-------------  -+  Popliteal                  yes   >1 second                              +------------------+---------+------+-----------+------------+-------------  -+  GSV at SFJ                  yes    >500 ms      1.00                     +------------------+---------+------+-----------+------------+-------------  -+  GSV prox thigh              yes    >500 ms      0.75                      +------------------+---------+------+-----------+------------+-------------  -+  GSV mid thigh               yes    >500 ms      0.68                     +------------------+---------+------+-----------+------------+-------------  -+  GSV dist thigh              yes    >500 ms      0.67    out of  fascia   +------------------+---------+------+-----------+------------+-------------  -+  GSV at knee                 yes    >500 ms      0.68    out of  fascia   +------------------+---------+------+-----------+------------+-------------  -+  SSV Pop Fossa                                           not  identified  +------------------+---------+------+-----------+------------+-------------  -+  anterior accessoryno                            0.37                     +------------------+---------+------+-----------+------------+-------------  -+         Summary:  Right:  - No evidence of deep vein thrombosis from the common femoral through the  popliteal veins.  - No evidence of superficial venous thrombosis.  - Significant deep vein reflux noted.  - The great saphenous vein is not competent.  - The small saphenous vein was not identified.      Assessment/Plan:    67 year old male with C4 a venous disease right lower extremity with lipodermatosclerosis and excessive swelling of the right greater than left lower extremity as well as symptomatic varicosities of the left medial thigh.  We discussed that his pain and swelling are likely multifactorial particularly given his multiple previous knee operations.  We evaluated his great saphenous vein at the  bedside today and it is very large at points measuring greater than 1 cm in the thigh and enters the fascia in the distal third of the thigh.  We discussed proceeding with laser ablation and will need significant tumescent to shrink the vein around the laser may also require high power  of the laser given that he has recanalized this vein in the past.  We will plan for stab phlebectomy less than 10 as well given that he has symptomatic varicosities about the right thigh.  He would need to be compliant with pression stockings which he has been compliant with.  I have also asked him to begin using his bio tab lymphedema pump again now that his shoulder is recovering.  All questions were answered he demonstrates good understanding we will get him scheduled near future.     Penne Lonni Colorado MD [facility]

## 2023-10-05 ENCOUNTER — Telehealth: Payer: Self-pay | Admitting: Neurology

## 2023-10-05 NOTE — Telephone Encounter (Signed)
 Please let pt know that MRI thoracic spine looks good, no evidence of cord  impingement. So far, there is no significant nerve compression from the back or thoracic spine contributing to his leg pain.  Thanks.    MRI thoracic spine 09/29/2023 from [facility]:  Mild degenerative changes of the thoracic spine.

## 2023-10-06 ENCOUNTER — Telehealth: Payer: Self-pay | Admitting: Neurology

## 2023-10-06 NOTE — Telephone Encounter (Signed)
 Called patient and left a message for a call back.

## 2023-10-06 NOTE — Telephone Encounter (Signed)
 Pt called this afternoon. Pt would like his MRI that he did in August and September to be sent to Dr. Bonner at   [facility] . Thanks

## 2023-10-06 NOTE — Telephone Encounter (Signed)
 Called patient and informed him that per Dr. Tobie she would like him to  him follow-up in the office to discuss this. Offered patient appt on 10/1 at 10:10.     Patient will take appointment. Patient also informed I will get his results faxed over to  Dr. Bonner at [facility] .

## 2023-10-06 NOTE — Telephone Encounter (Signed)
 Called patient and informed him of results. Patient wanted to know what the next steps will be as his Neuropathy and legs have been bothering him everyday, Informed patient I will send a message back to dr. Tobie and give him a call back.

## 2023-10-06 NOTE — Telephone Encounter (Signed)
 Let's have him follow-up in the office to discuss this.

## 2023-10-10 ENCOUNTER — Other Ambulatory Visit: Payer: Self-pay | Admitting: *Deleted

## 2023-10-10 DIAGNOSIS — I83891 Varicose veins of right lower extremities with other complications: Secondary | ICD-10-CM

## 2023-10-11 ENCOUNTER — Encounter: Payer: Self-pay | Admitting: Neurology

## 2023-10-11 ENCOUNTER — Ambulatory Visit (INDEPENDENT_AMBULATORY_CARE_PROVIDER_SITE_OTHER): Admitting: Neurology

## 2023-10-11 VITALS — BP 162/84 | HR 70 | Ht 76.0 in | Wt 336.0 lb

## 2023-10-11 DIAGNOSIS — M79604 Pain in right leg: Secondary | ICD-10-CM

## 2023-10-11 DIAGNOSIS — G629 Polyneuropathy, unspecified: Secondary | ICD-10-CM

## 2023-10-11 DIAGNOSIS — M79605 Pain in left leg: Secondary | ICD-10-CM

## 2023-10-11 NOTE — Progress Notes (Signed)
 Follow-up Visit   Date: 10/11/2023    Travis Myers MRN: [id number] DOB: [birth year]    Travis Myers is a 68 y.o. right-handed Caucasian male with hyperlipidemia, lymphedema, AAA, and hypertension returning to the clinic for follow-up of neuropathy.  The patient was accompanied to the clinic by self.  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  Neuropathy labs are normal.  NCS/EMG shows neuropathy affecting the upper extremities, also.  Unfortunately there is no effective curative treatment for neuropathy and management is symptomatic.  He is followed by pain management and there has been discussion for possible spinal cord stimulator which can help individuals with neuropathy also. He is doing PT and compliant with using cane.   Exertional leg pain.  Imaging of the lumbar and thoracic spine shows mild degenerative changes, nothing which would explain the severity of his symptoms. Specifically, there is no evidence of spinal canal stenosis.  Further imaging of the cervical spine was discussed, but pathology in the spine would not cause low back or exertional leg pain.  It was mutually decided not to proceed with imaging the cervical spine.  If he develops neck pain, new weakness, or radicular pain, this can be reconsidered.   Return to clinic as needed  --------------------------------------------- History of present illness: Starting around early 2025, he began having numbness involving the balls of the feet and toes, which is constant.  Occasionally, he also has numbness involving the heel.  He denies significant pain in the feet.    He does, however, report severe pain in the knees and thighs, which feels like someone squeezing the knees.  His pain has been severe over the past three weeks and limits his ability to walk.  He has mild low back pain.  He started using a cane three weeks ago.   He had  right ulnar decompression and residual numbness of the 4th and 5th finger.  Over the past few weeks, he also has right thumb and middle finger numbness.  SABRA     UPDATE 10/11/2023:  He is here for follow-up visit to discuss results of EMG, MRI thoracic spine and MRI lumbar spine.  EMG of the arms shows sensorimotor polyneuropathy.  MRI of the thoracic and lumbar spine show mild degenerative changes, no evidence of spinal canal stenosis.  He continues to have low back and exertional leg pain.  He also reports much of his leg pain stems from his knees.  No neck pain or weakness.   Medications:  Current Outpatient Medications on File Prior to Visit  Medication Sig Dispense Refill   acetaminophen  (TYLENOL ) 500 MG tablet Take 1,000 mg by mouth 3 (three) times daily. Takes with tramadol      Ascorbic Acid (VITAMIN C ) 1000 MG tablet Take 1,000 mg by mouth daily.     aspirin  81 MG chewable tablet Chew 81 mg by mouth daily.     atorvastatin  (LIPITOR) 40 MG tablet TAKE 1 TABLET BY MOUTH EVERY DAY 15 tablet 0   cyanocobalamin (VITAMIN B12) 1000 MCG tablet Take 1,000 mcg by mouth daily.     diazepam  (VALIUM ) 5 MG tablet Take 1 tablet 30-min prior to MRI.  OK to repeat at facility if needed. Do not drive while taking this medication. 2 tablet 0   furosemide  (LASIX ) 20 MG tablet Take 40 mg by mouth in the morning. Additional 20 mg if needed for fluid  5   meclizine  (ANTIVERT ) 25 MG tablet Take 25  mg by mouth 3 (three) times daily as needed for dizziness.     methocarbamol  (ROBAXIN ) 500 MG tablet Take 1 tablet (500 mg total) by mouth 3 (three) times daily as needed for muscle spasms. 30 tablet 1   Polyethyl Glycol-Propyl Glycol (LUBRICATING EYE DROPS OP) Place 1 drop into both eyes daily as needed (dry eyes).     pregabalin  (LYRICA ) 75 MG capsule Take 75 mg by mouth 2 (two) times daily.     traMADol  (ULTRAM ) 50 MG tablet Take 100 mg by mouth 3 (three) times daily.     triamcinolone  (KENALOG ) 0.1 % Apply 1  application  topically once a week.     valsartan (DIOVAN) 320 MG tablet Take 320 mg by mouth daily.     diclofenac (VOLTAREN) 75 MG EC tablet Take 75 mg by mouth 2 (two) times daily. (Patient not taking: Reported on 10/11/2023)     traZODone  (DESYREL ) 50 MG tablet Take 50 mg by mouth at bedtime. (Patient not taking: Reported on 10/11/2023)     No current facility-administered medications on file prior to visit.    Allergies:  Allergies  Allergen Reactions   Ciprofloxacin Other (See Comments)    Unknown   Penicillins Other (See Comments)    Childhood reaction- not recalled  Tolerated Cephalosporin Date: 08/24/22.   Quinolones Other (See Comments)    Patient was warned about not using Cipro and similar antibiotics. Recent studies have raised concern that fluoroquinolone antibiotics could be associated with an increased risk of aortic aneurysm Fluoroquinolones have non-antimicrobial properties that might jeopardise the integrity of the extracellular matrix of the vascular wall In a  propensity score matched cohort study in [place], there was a 66% increased rate of aortic aneurysm or dissection associated with oral fluoroquinolone use, compared wit    Vital Signs:  BP (!) 162/84   Pulse 70   Ht 6' 4 (1.93 m)   Wt (!) 336 lb (152.4 kg)   SpO2 97%   BMI 40.90 kg/m    Neurological Exam: MENTAL STATUS including orientation to time, place, person, recent and remote memory, attention span and concentration, language, and fund of knowledge is normal.  Speech is not dysarthric.  CRANIAL NERVES:  Normal conjugate, extra-ocular eye movements in all directions of gaze.  No ptosis.  Face is symmetric.   MOTOR:  Motor strength is 5/5 in all extremities, except RLE is 5-/5.  No atrophy, fasciculations or abnormal movements.  No pronator drift.  Tone is normal.    MSRs:                                           Right        Left brachioradialis 2+  2+  biceps 2+  2+  triceps 2+  2+  patellar  3+  3+  ankle jerk 0  0   SENSORY:  Vibration absent below the ankles.   COORDINATION/GAIT:   Gait is wide-based, assisted with a cane.      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   MRI lumbar spine 08/25/2023 at [facility]: 1.  Mild degenerative disc disease and mild to moderate facet joint arthritis.  2.  Mild  spinal stenosis L2-3 with mild foraminal stenosis on the right.  3.  Moderate foraminal stenosis on the right at L3-4.  4.  Mild bilateral foraminal stenosis L4-5   MRI thoracic spine 09/29/2023 from [facility]:  Mild degenerative changes of the thoracic spine.    Thank you for allowing me to participate in patient's care.  If I can answer any additional questions, I would be pleased to do so.    Sincerely,    Tawan Degroote K. Tobie, DO

## 2023-10-23 ENCOUNTER — Ambulatory Visit: Admitting: Podiatry

## 2023-10-23 DIAGNOSIS — B351 Tinea unguium: Secondary | ICD-10-CM

## 2023-10-23 DIAGNOSIS — I739 Peripheral vascular disease, unspecified: Secondary | ICD-10-CM

## 2023-10-23 DIAGNOSIS — L84 Corns and callosities: Secondary | ICD-10-CM

## 2023-10-23 DIAGNOSIS — M79675 Pain in left toe(s): Secondary | ICD-10-CM | POA: Diagnosis not present

## 2023-10-23 DIAGNOSIS — M79674 Pain in right toe(s): Secondary | ICD-10-CM | POA: Diagnosis not present

## 2023-10-23 NOTE — Progress Notes (Signed)
 Subjective:  Patient ID: Travis JINNY Grayce PONCE, male    DOB: 16-Dec-1955,  MRN: [id number]  Travis Myers:  Chief Complaint  Patient presents with   [facility]     RFC non diabetic toenail trim. 0 pain. Callus sub met R2 and lateral arch.    Patient notes nails are thick and elongated, causing pain in shoe gear when ambulating.  He gets painful corns and calluses to the plantar aspect of the forefoot bilateral  PCP is Montey Lot, PA-C.  Last seen around 09/21/2023  Past Medical History:  Diagnosis Date   Acute meniscal tear of knee left   Anemia    Aneurysm, ascending aorta 4.8cm per ct 2011/ CARDIOLOGSIT--  DR MAYE  LOV  10-07-2011  NOTE W/ CHART AND REQUEST LEXISCAN  MYOVIEW AND ECHO TO BE FAXED.   ASYMPTOMATIC   Heart murmur MILD-- ASYMPTOMATIC   Hyperlipidemia    Hypertension    Lymph edema    bilateral legs- stockings on both legs   Neuromuscular disorder (HCC)    ltmphadema neuropathy feet and toes   OA (osteoarthritis) of knee left   OSA (obstructive sleep apnea) NON-TOLERANT CPAP   no cpap PT REPORTS NEVER FINISHED TEST    Pneumonia    Pre-diabetes    Sleep apnea    Tinnitus of both ears    Allergies  Allergen Reactions   Ciprofloxacin Other (See Comments)    Unknown   Penicillins Other (See Comments)    Childhood reaction- not recalled  Tolerated Cephalosporin Date: 08/24/22.   Quinolones Other (See Comments)    Patient was warned about not using Cipro and similar antibiotics. Recent studies have raised concern that fluoroquinolone antibiotics could be associated with an increased risk of aortic aneurysm Fluoroquinolones have non-antimicrobial properties that might jeopardise the integrity of the extracellular matrix of the vascular wall In a  propensity score matched cohort study in [place], there was a 66% increased rate of aortic aneurysm or dissection associated with oral fluoroquinolone use, compared wit     Objective:  Travis Myers is a pleasant 68 y.o. male in NAD. AAO x 3.  Vascular Examination: Patient has palpable DP pulse, absent PT pulse bilateral.  Delayed capillary refill bilateral toes.  Sparse digital hair bilateral.  Proximal to distal cooling WNL bilateral.    Dermatological Examination: Interspaces are clear with no open lesions noted bilateral.  Skin is shiny and atrophic bilateral.  Nails are 3-46mm thick, with yellowish/brown discoloration, subungual debris and distal onycholysis x10.  There is pain with compression of nails x10.  There are hyperkeratotic lesions noted bilateral submet 5, right submet 2 and submet 3..  Patient qualifies Myers at-risk foot care because of PVD .  Assessment/Plan: 1. Pain due to onychomycosis of toenails of both feet   2. Callus of foot   3. PVD (peripheral vascular disease)     Mycotic nails x10 were sharply debrided with sterile nail nippers and power debriding burr to decrease bulk and length.  Hyperkeratotic lesions x 4 were shaved with #312 blade.  Return in about 3 months (around 01/23/2024) [facility] RFC.   Awanda CHARM Imperial, DPM, FACFAS [facility]     [address]  [place], [place] [address]                Office [phone]  Fax [phone]

## 2023-10-25 ENCOUNTER — Other Ambulatory Visit: Payer: Self-pay | Admitting: *Deleted

## 2023-10-25 MED ORDER — LORAZEPAM 1 MG PO TABS: ORAL_TABLET | ORAL | 0 refills | Status: DC

## 2023-10-31 ENCOUNTER — Encounter: Payer: Self-pay | Admitting: Vascular Surgery

## 2023-10-31 ENCOUNTER — Ambulatory Visit: Attending: Vascular Surgery | Admitting: Vascular Surgery

## 2023-10-31 VITALS — BP 154/108 | HR 70 | Temp 98.3°F | Resp 18 | Ht 76.0 in | Wt 323.0 lb

## 2023-10-31 DIAGNOSIS — I83891 Varicose veins of right lower extremities with other complications: Secondary | ICD-10-CM

## 2023-10-31 HISTORY — PX: LASER ABLATION: SHX1947

## 2023-10-31 NOTE — Progress Notes (Signed)
 Patient name: Travis Myers MRN: [id number] DOB: Feb 20, 1955 Sex: male  REASON FOR VISIT: Treatment of right lower extremity chronic venous insufficiency  HPI: Travis Myers is a 68 y.o. 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  He does have a history of previous laser ablation with recanalization.  He now presents for definitive management of right lower extremity venous reflux as well as treatment of a few symptomatic varicosities of the right thigh.  Current Outpatient Medications  Medication Sig Dispense Refill   LORazepam  (ATIVAN ) 1 MG tablet Take 2  tablets 30 to 60 minutes prior to leaving the house on day of office surgery. Bring third tablet with you to office on day of office surgery. 3 tablet 0   acetaminophen  (TYLENOL ) 500 MG tablet Take 1,000 mg by mouth 3 (three) times daily. Takes with tramadol      Ascorbic Acid (VITAMIN C ) 1000 MG tablet Take 1,000 mg by mouth daily.     aspirin  81 MG chewable tablet Chew 81 mg by mouth daily.     atorvastatin  (LIPITOR) 40 MG tablet TAKE 1 TABLET BY MOUTH EVERY DAY 15 tablet 0   cyanocobalamin (VITAMIN B12) 1000 MCG tablet Take 1,000 mcg by mouth daily.     diazepam  (VALIUM ) 5 MG tablet Take 1 tablet 30-min prior to MRI.  OK to repeat at facility if needed. Do not drive while taking this medication. 2 tablet 0   diclofenac (VOLTAREN) 75 MG EC tablet Take 75 mg by mouth 2 (two) times daily.     furosemide  (LASIX ) 20 MG tablet Take 40 mg by mouth in the morning. Additional 20 mg if needed for fluid  5   meclizine  (ANTIVERT ) 25 MG tablet Take 25 mg by mouth 3 (three) times daily as needed for dizziness.     methocarbamol  (ROBAXIN ) 500 MG tablet Take 1 tablet (500 mg total) by mouth 3 (three) times daily as needed for muscle spasms. 30 tablet 1   Polyethyl Glycol-Propyl Glycol (LUBRICATING EYE DROPS OP) Place 1 drop into both eyes daily  as needed (dry eyes).     pregabalin  (LYRICA ) 75 MG capsule Take 75 mg by mouth 2 (two) times daily.     traMADol  (ULTRAM ) 50 MG tablet Take 100 mg by mouth 3 (three) times daily.     traZODone  (DESYREL ) 50 MG tablet Take 50 mg by mouth at bedtime.     triamcinolone  (KENALOG ) 0.1 % Apply 1 application  topically once a week.     valsartan (DIOVAN) 320 MG tablet Take 320 mg by mouth daily.     No current facility-administered medications for this visit.    PHYSICAL EXAM: Vitals:   10/31/23 1107  BP: (!) 154/108  Pulse: 70  Resp: 18  Temp: 98.3 F (36.8 C)  SpO2: 97%    Awake alert and oriented Nonlabored respirations Varicosities right lower extremity marked with the patient standing  PROCEDURE: Right greater saphenous vein ablation totaling 32 cm and stab phlebectomy less than 10 of anterior and medial thigh  TECHNIQUE: [name] was taken to the procedure room where he was initially evaluated in the standing position and varicosities of the right lower extremity were marked.  He was then laid supine and sterilely prepped and draped in the right lower extremity in usual fashion and a timeout was called.  The greater saphenous vein was evaluated and noted to be  patent and compressible at the below the knee and all the way through the saphenofemoral junction and was noted to be very large and was cannulated with micropuncture needle followed by wire and a sheath.  A J-wire was then placed 3 cm proximal saphenofemoral junction and a 45 cm laser catheter was then placed.  Tumescent anesthesia was instilled along the area of the laser and the vein was then ablated for a total of 32 cm.  At completion the common femoral vein was noted to be patent and compressible and the saphenofemoral junction proximally was patent.  Along the way the greater saphenous vein was noted to be sclerotic.  Above the knee it appeared that possibly remained patent and 1 stab incision was created and the  saphenous vein was grasped and appeared to tear and there was brisk bleeding and pressure was held until hemostasis was obtained.  Attention was then turned to the few areas of varicosities which were previously marked on the skin of the anterior medial thigh.  These areas were anesthetized 1% lidocaine  and stab incisions were created less than 10 and the veins were removed with a combination of hemostat and staying up.  Steri-Strips were placed at completion followed by a compressive wrap.  He tolerated the procedure well without immediate complication.  Plan will be for follow-up in 2 weeks with postablation duplex.  Penne Colorado [facility] [phone]

## 2023-10-31 NOTE — Progress Notes (Signed)
     Laser Ablation Procedure    Date: 10/31/2023 Travis Myers DOB:04-30-1955 Consent signed: Yes     Surgeon: Dr. Penne [place] Procedure: Laser Ablation: right Greater Saphenous Vein  BP (!) 154/108 (BP Location: Left Arm, Patient Position: Sitting, Cuff Size: Large)   Pulse 70   Temp 98.3 F (36.8 C) (Temporal)   Resp 18   Ht 6' 4 (1.93 m)   Wt (!) 323 lb (146.5 kg)   SpO2 97%   BMI 39.32 kg/m  Tumescent Anesthesia: 500 cc 0.9% NaCl with 50 cc Lidocaine  HCL 1%  and 15 cc 8.4% NaHCO3 Local Anesthesia: 15 cc Lidocaine  HCL and NaHCO3 (ratio 2:1) 7 watts continuous mode    Total energy: 1604.5 joules    Total time: 229 seconds Treatment Length 32 cm Laser Fiber Ref. #   [id number]          Lot # [id number] Stab Phlebectomy: <10 Sites: Thigh Patient tolerated procedure well  Notes: All staff members wore facial masks and facial shields/goggles.  Pt. Had 1 mg of ativan  at 08:55 am prior to the surgical procdure  Description of Procedure: After marking the course of the secondary varicosities, the patient was placed on the operating table in the supine position, and the right leg was prepped and draped in sterile fashion.   Local anesthetic was administered and under ultrasound guidance the saphenous vein was accessed with a micro needle and guide wire; then the mirco puncture sheath was placed.  A guide wire was inserted saphenofemoral junction , followed by a 5 french sheath.  The position of the sheath and then the laser fiber below the junction was confirmed using the ultrasound.  Tumescent anesthesia was administered along the course of the saphenous vein using ultrasound guidance. The patient was placed in Trendelenburg position and protective laser glasses were placed on patient and staff, and the laser was fired at 7 watts continuous mode for a total of 1604.5 joules.  For stab phlebectomies, local anesthetic was administered at the previously marked varicosities, and  tumescent anesthesia was administered around the vessels.  Ten to 20 stab wounds were made using the tip of an 11 blade. And using the vein hook, the phlebectomies were performed using a hemostat to avulse the varicosities.  Adequate hemostasis was achieved.   Steri strips were applied to the stab wounds and ABD pads and thigh high compression stockings were applied.  Ace wrap bandages were applied over the phlebectomy sites and at the top of the saphenofemoral junction. Blood loss was less than 15 cc.  Discharge instructions reviewed with patient and hardcopy of discharge instructions given to patient to take home. The patient was wheeled out of the operating room having tolerated the procedure well.

## 2023-11-01 ENCOUNTER — Other Ambulatory Visit: Payer: Self-pay

## 2023-11-01 DIAGNOSIS — I83891 Varicose veins of right lower extremities with other complications: Secondary | ICD-10-CM

## 2023-11-15 ENCOUNTER — Ambulatory Visit: Admitting: Vascular Surgery

## 2023-11-15 ENCOUNTER — Ambulatory Visit (HOSPITAL_COMMUNITY)
Admission: RE | Admit: 2023-11-15 | Discharge: 2023-11-15 | Disposition: A | Discharge status: Home / Self Care | Source: Ambulatory Visit | Attending: Vascular Surgery | Admitting: Vascular Surgery

## 2023-11-15 ENCOUNTER — Encounter: Payer: Self-pay | Admitting: Vascular Surgery

## 2023-11-15 VITALS — BP 129/76 | HR 75 | Temp 98.0°F | Ht 76.0 in | Wt 334.0 lb

## 2023-11-15 DIAGNOSIS — I83891 Varicose veins of right lower extremities with other complications: Secondary | ICD-10-CM

## 2023-11-15 NOTE — Progress Notes (Signed)
 Patient ID: Travis Myers, male   DOB: [birth year], 68 y.o.   MRN: [id number]  Reason for Consult: Routine Post Op   Referred by Montey Lot, PA-C  Subjective:     HPI:  Travis Myers is a 68 y.o. male with history of lipodermatosclerosis and edema of his right lower extremity with symptomatic right greater saphenous vein with previous history of ablation with recanalization.  He is now status post right greater saphenous vein ablation and stab phlebectomy of less than 10 sites of the right lower extremity.  He states that the edema has somewhat improved as has the skin around his right medial ankle.  He has remained compliant with thigh-high compression stockings.  He has undergone spinal injection for back pain and lower extremity pain since our procedure.  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   ASYMPTOMATIC   Heart murmur MILD-- ASYMPTOMATIC   Hyperlipidemia    Hypertension    Lymph edema    bilateral legs- stockings on both legs   Neuromuscular disorder (HCC)    ltmphadema neuropathy feet and toes   OA (osteoarthritis) of knee left   OSA (obstructive sleep apnea) NON-TOLERANT CPAP   no cpap PT REPORTS NEVER FINISHED TEST    Pneumonia    Pre-diabetes    Sleep apnea    Tinnitus of both ears    Family History  Problem Relation Age of Onset   Diabetes Mother    Cancer Mother    Heart failure Other    Diabetes Other    Cancer Other    COPD Other    Colon cancer Neg Hx    Stomach cancer Neg Hx    Rectal cancer Neg Hx    Esophageal cancer Neg Hx    Past Surgical History:  Procedure Laterality Date   BILATERAL IINGUINAL HERNIA REPAIR  1963   DOPPLER ECHOCARDIOGRAPHY  10/18/2011   EF >55 %   ENDOVENOUS ABLATION SAPHENOUS VEIN W/ LASER Right  12/30/2020   endovenous laser ablation right greater saphenous vein by Medford Blade MD   EXCISIONAL TOTAL KNEE ARTHROPLASTY WITH ANTIBIOTIC SPACERS Right 07/22/2021   Procedure: EXCISIONAL TOTAL KNEE ARTHROPLASTY WITH ANTIBIOTIC SPACERS;  Surgeon: Ernie Cough, MD;  Location: [facility];  Service: Orthopedics;  Laterality: Right;   INCISION AND DRAINAGE OF WOUND Right 04/27/2021   Procedure: IRRIGATION AND DEBRIDEMENT OF HEMATOMA AND ABCESS ,ASPIRATE RIGHT KNEE;  Surgeon: Gerome Charleston, MD;  Location: [facility];  Service: Orthopedics;  Laterality: Right;  REQUEST 3:30PM START TIME   LASER ABLATION Right 10/31/2023   Laser ablation of the right greater saphenous vein with <10 stab phlbectomies   LUMBAR LAMINECTOMY/DECOMPRESSION MICRODISCECTOMY N/A 02/24/2016   Procedure: Bilateral Decompression L4-5, Discectomy L4-5 ;  Surgeon: Reyes Billing, MD;  Location: [facility];  Service: Orthopedics;  Laterality: N/A;   NM MYOCAR PERF WALL MOTION  10/13/2011   EF 49 %,low risk study   REIMPLANTATION OF TOTAL KNEE Right 09/30/2021   Procedure: REIMPLANTATION / REVISON OF TOTAL KNEE;  Surgeon: Ernie Cough, MD;  Location: [facility];  Service: Orthopedics;  Laterality: Right;  150   REVERSE SHOULDER ARTHROPLASTY Right 02/16/2023   Procedure: REVERSE SHOULDER ARTHROPLASTY;  Surgeon: Melita Drivers, MD;  Location: [facility];  Service: Orthopedics;  Laterality: Right;  120 min   right elbow surgery     ulnar nerve transposiiton   SHOULDER ARTHROSCOPY Left 2022   TONSILLECTOMY  1965   TORN MENISCUS X 3 IN KNEES SURGERY      LEFT    TOTAL HIP ARTHROPLASTY Left 06/18/2009   OA LEFT HIP   TOTAL HIP ARTHROPLASTY Right 03/11/2014   Procedure: RIGHT TOTAL HIP ARTHROPLASTY ANTERIOR APPROACH;  Surgeon: Donnice JONETTA Car, MD;  Location: [facility];  Service: Orthopedics;  Laterality: Right;   TOTAL KNEE ARTHROPLASTY Right 02/26/2020   Procedure: TOTAL KNEE ARTHROPLASTY;  Surgeon: Gerome Charleston, MD;  Location: [facility];  Service:  Orthopedics;  Laterality: Right;  adductor canal   TOTAL KNEE ARTHROPLASTY Left 08/23/2022   Procedure: TOTAL KNEE ARTHROPLASTY;  Surgeon: Car Donnice, MD;  Location: [facility];  Service: Orthopedics;  Laterality: Left;   TRANSTHORACIC ECHOCARDIOGRAM  09/11/2012   EF 55 to 60 %,mild aortic valve regurg,mild mitral valve regurg,lf and rt atriums mildly dilated   VARICOSE VEIN SURGERY Left 1980   and 1994    Short Social History:  Social History   Tobacco Use   Smoking status: Never   Smokeless tobacco: Former    Quit date: 09/11/1988  Substance Use Topics   Alcohol use: Not Currently    Alcohol/week: 1.0 standard drink of alcohol    Types: 1 Standard drinks or equivalent per week    Comment: RARE    Allergies  Allergen Reactions   Ciprofloxacin Other (See Comments)    Unknown   Penicillins Other (See Comments)    Childhood reaction- not recalled  Tolerated Cephalosporin Date: 08/24/22.   Quinolones Other (See Comments)    Patient was warned about not using Cipro and similar antibiotics. Recent studies have raised concern that fluoroquinolone antibiotics could be associated with an increased risk of aortic aneurysm Fluoroquinolones have non-antimicrobial properties that might jeopardise the integrity of the extracellular matrix of the vascular wall In a  propensity score matched cohort study in [place], there was a 66% increased rate of aortic aneurysm or dissection associated with oral fluoroquinolone use, compared wit    Current Outpatient Medications  Medication Sig Dispense Refill   acetaminophen  (TYLENOL ) 500 MG tablet Take 1,000 mg by mouth 3 (three) times daily. Takes with tramadol      Ascorbic Acid (VITAMIN C ) 1000 MG tablet Take 1,000 mg by mouth daily.     aspirin  81 MG chewable tablet Chew 81 mg by mouth daily.     atorvastatin  (LIPITOR) 40 MG tablet TAKE 1 TABLET BY MOUTH EVERY DAY 15 tablet 0   cyanocobalamin (VITAMIN B12) 1000 MCG tablet Take 1,000 mcg by mouth  daily.     diclofenac (VOLTAREN) 75 MG EC tablet Take 75 mg by mouth 2 (two) times daily.     furosemide  (LASIX ) 20 MG tablet Take 40 mg by mouth in the morning. Additional 20 mg if needed for fluid  5   meclizine  (ANTIVERT ) 25 MG tablet Take 25 mg by mouth 3 (three) times daily as needed for dizziness.     methocarbamol  (ROBAXIN ) 500 MG tablet Take 1 tablet (500 mg total) by mouth 3 (three) times daily as needed for muscle spasms. 30 tablet 1   Polyethyl Glycol-Propyl Glycol (LUBRICATING EYE DROPS OP) Place 1 drop into both eyes daily as needed (dry eyes).     pregabalin  (LYRICA ) 75 MG capsule Take 75 mg by mouth 2 (two) times daily.     traMADol  (  ULTRAM ) 50 MG tablet Take 100 mg by mouth 3 (three) times daily.     traZODone  (DESYREL ) 50 MG tablet Take 50 mg by mouth at bedtime.     triamcinolone  (KENALOG ) 0.1 % Apply 1 application  topically once a week.     valsartan (DIOVAN) 320 MG tablet Take 320 mg by mouth daily.     diazepam  (VALIUM ) 5 MG tablet Take 1 tablet 30-min prior to MRI.  OK to repeat at facility if needed. Do not drive while taking this medication. 2 tablet 0   No current facility-administered medications for this visit.    Review of Systems  Constitutional:  Constitutional negative. HENT: HENT negative.  Eyes: Eyes negative.  Respiratory: Respiratory negative.  Cardiovascular: Positive for leg swelling.  GI: Gastrointestinal negative.  Musculoskeletal: Positive for leg pain.  Neurological: Neurological negative. Hematologic: Hematologic/lymphatic negative.  Psychiatric: Psychiatric negative.        Objective:  Objective   Vitals:   11/15/23 1419  BP: 129/76  Pulse: 75  Temp: 98 F (36.7 C)  SpO2: 94%  Weight: (!) 334 lb (151.5 kg)  Height: 6' 4 (1.93 m)   Body mass index is 40.66 kg/m.  Physical Exam HENT:     Head: Normocephalic.     Nose: Nose normal.     Mouth/Throat:     Mouth: Mucous membranes are moist.  Cardiovascular:     Rate and  Rhythm: Normal rate.  Pulmonary:     Effort: Pulmonary effort is normal.  Musculoskeletal:     Right lower leg: Edema present.     Comments: Compression stocking on left lower extremity Right lower extremity ankle much softer than preoperative exam  Skin:    Capillary Refill: Capillary refill takes less than 2 seconds.  Neurological:     General: No focal deficit present.     Mental Status: He is alert.     Data: Venous Reflux Times  +--------------+---------+------+-----------+------------+-----------------  ----+  RIGHT        Reflux NoRefluxReflux TimeDiameter cmsComments                                        Yes                                                 +--------------+---------+------+-----------+------------+-----------------  ----+  CFV                                                patent                  +--------------+---------+------+-----------+------------+-----------------  ----+  FV prox                                             patent                  +--------------+---------+------+-----------+------------+-----------------  ----+  FV mid  patent                  +--------------+---------+------+-----------+------------+-----------------  ----+  FV dist                                             patent                  +--------------+---------+------+-----------+------------+-----------------  ----+  Popliteal                                          patent                  +--------------+---------+------+-----------+------------+-----------------  ----+  GSV at SFJ                                          patent                  +--------------+---------+------+-----------+------------+-----------------  ----+  GSV prox thigh                                      partial                  +--------------+---------+------+-----------+------------+-----------------  ----+  GSV mid thigh                                       partial                 +--------------+---------+------+-----------+------------+-----------------  ----+  GSV dist thigh                                      partial out of  fascia                                                     Patent within  fascia   +--------------+---------+------+-----------+------------+-----------------  ----+  GSV at knee                                         complete                +--------------+---------+------+-----------+------------+-----------------  ----+  GSV prox calf                                       partial                 +--------------+---------+------+-----------+------------+-----------------  ----+         Summary:  Right:  - No evidence of deep vein thrombosis seen in the right lower extremity,  from the common femoral through the popliteal veins.  - Partial thrombus in the GSV from the proximal thigh (5.72 cm from the  SFJ) to the mid thigh and out of fascia into the proximal calf.      Assessment/Plan:     68 year old male with a history of ablation of the right greater saphenous vein with subsequent recanalization now status post great saphenous vein ablation appears ablated around the area of the knee in the distal thigh and partially ablated above there.  Symptoms are improving.  He continues with thigh-high compression stocking.  He can follow-up with us  on an as-needed basis.     Penne Lonni [place] MD [facility]

## 2023-11-28 ENCOUNTER — Ambulatory Visit: Payer: Self-pay | Admitting: Neurology

## 2023-11-28 ENCOUNTER — Other Ambulatory Visit: Payer: Self-pay | Admitting: Cardiovascular Disease

## 2023-11-28 DIAGNOSIS — G629 Polyneuropathy, unspecified: Secondary | ICD-10-CM

## 2023-12-16 ENCOUNTER — Other Ambulatory Visit: Payer: Self-pay | Admitting: Cardiovascular Disease

## 2023-12-29 ENCOUNTER — Other Ambulatory Visit: Payer: Self-pay | Admitting: Vascular Surgery

## 2023-12-29 ENCOUNTER — Telehealth: Payer: Self-pay

## 2023-12-29 DIAGNOSIS — R2241 Localized swelling, mass and lump, right lower limb: Secondary | ICD-10-CM

## 2023-12-29 NOTE — Telephone Encounter (Signed)
 Patient called reporting swelling of right ankle and pain and swelling below his right knee.  He also reported right leg turning dark. Patient continues to wear compression stocking and elevate his legs.  Advised patient to go to urgent care to check for cellulitis. Made DVT US  appt for 01/01/24  Patient knows to call EMS for acute shortness of breath.

## 2024-01-01 ENCOUNTER — Ambulatory Visit (HOSPITAL_COMMUNITY)
Admission: RE | Admit: 2024-01-01 | Discharge: 2024-01-01 | Disposition: A | Discharge status: Home / Self Care | Source: Ambulatory Visit | Attending: Surgery | Admitting: Surgery

## 2024-01-01 DIAGNOSIS — R2241 Localized swelling, mass and lump, right lower limb: Secondary | ICD-10-CM | POA: Diagnosis not present

## 2024-01-10 NOTE — Progress Notes (Signed)
 Cancelled the appt

## 2024-01-14 ENCOUNTER — Other Ambulatory Visit: Payer: Self-pay | Admitting: Cardiology

## 2024-01-22 ENCOUNTER — Ambulatory Visit: Admitting: Podiatry

## 2024-01-24 ENCOUNTER — Encounter: Payer: Self-pay | Admitting: Podiatry

## 2024-01-24 ENCOUNTER — Other Ambulatory Visit: Payer: Self-pay | Admitting: Cardiovascular Disease

## 2024-01-24 ENCOUNTER — Ambulatory Visit: Admitting: Podiatry

## 2024-01-24 DIAGNOSIS — I739 Peripheral vascular disease, unspecified: Secondary | ICD-10-CM

## 2024-01-24 DIAGNOSIS — L84 Corns and callosities: Secondary | ICD-10-CM

## 2024-01-24 DIAGNOSIS — I712 Thoracic aortic aneurysm, without rupture, unspecified: Secondary | ICD-10-CM

## 2024-01-24 DIAGNOSIS — I351 Nonrheumatic aortic (valve) insufficiency: Secondary | ICD-10-CM

## 2024-01-24 DIAGNOSIS — B351 Tinea unguium: Secondary | ICD-10-CM

## 2024-01-24 NOTE — Progress Notes (Signed)
 This patient presents to the office with chief complaint of long thick painful nails.  Patient says the nails are painful walking and wearing shoes.  This patient is unable to self treat.  This patient is unable to trim hisnails since she is unable to reach his nails.  he presents to the office for preventative foot care services.  General Appearance  Alert, conversant and in no acute stress.  Vascular  Dorsalis pedis and posterior tibial  pulses are palpable  bilaterally.  Capillary return is within normal limits  bilaterally. Temperature is within normal limits  bilaterally.  Neurologic  Senn-Weinstein monofilament wire test within normal limits  bilaterally. Muscle power within normal limits bilaterally.  Nails Thick disfigured discolored nails with subungual debris  from hallux to fifth toes bilaterally. No evidence of bacterial infection or drainage bilaterally.  Orthopedic  No limitations of motion  feet .  No crepitus or effusions noted.  No bony pathology or digital deformities noted.  Skin  normotropic skin with no porokeratosis noted bilaterally.  No signs of infections or ulcers noted.     Onychomycosis  Nails  B/L.  Pain in right toes  Pain in left toes  Debridement of nails both feet followed trimming the nails with dremel tool.    RTC 3 months.   Helane Gunther DPM

## 2024-01-28 ENCOUNTER — Other Ambulatory Visit: Payer: Self-pay | Admitting: Cardiovascular Disease

## 2024-01-31 NOTE — Telephone Encounter (Signed)
 Lipid Completed on 08/09/23

## 2024-02-07 ENCOUNTER — Other Ambulatory Visit: Payer: Self-pay | Admitting: Cardiovascular Disease

## 2024-02-27 ENCOUNTER — Ambulatory Visit (HOSPITAL_COMMUNITY)

## 2024-04-24 ENCOUNTER — Ambulatory Visit: Admitting: Podiatry
